# Patient Record
Sex: Female | Born: 1950 | Race: White | Hispanic: No | Marital: Single | Smoking: Never smoker
Health system: Southern US, Community
[De-identification: ages and names within clinical notes are randomized; demographics above are authoritative.]

## PROBLEM LIST (undated history)

## (undated) DIAGNOSIS — I2510 Atherosclerotic heart disease of native coronary artery without angina pectoris: Secondary | ICD-10-CM

## (undated) DIAGNOSIS — I214 Non-ST elevation (NSTEMI) myocardial infarction: Secondary | ICD-10-CM

## (undated) DIAGNOSIS — I509 Heart failure, unspecified: Secondary | ICD-10-CM

## (undated) DIAGNOSIS — J45909 Unspecified asthma, uncomplicated: Secondary | ICD-10-CM

## (undated) DIAGNOSIS — R519 Headache, unspecified: Secondary | ICD-10-CM

## (undated) DIAGNOSIS — K219 Gastro-esophageal reflux disease without esophagitis: Secondary | ICD-10-CM

## (undated) DIAGNOSIS — M1990 Unspecified osteoarthritis, unspecified site: Secondary | ICD-10-CM

## (undated) DIAGNOSIS — N189 Chronic kidney disease, unspecified: Secondary | ICD-10-CM

## (undated) DIAGNOSIS — I10 Essential (primary) hypertension: Secondary | ICD-10-CM

## (undated) DIAGNOSIS — R51 Headache: Secondary | ICD-10-CM

## (undated) DIAGNOSIS — N200 Calculus of kidney: Secondary | ICD-10-CM

## (undated) HISTORY — PX: LITHOTRIPSY: SUR834

## (undated) HISTORY — DX: Unspecified osteoarthritis, unspecified site: M19.90

## (undated) HISTORY — DX: Unspecified asthma, uncomplicated: J45.909

## (undated) HISTORY — DX: Chronic kidney disease, unspecified: N18.9

## (undated) HISTORY — DX: Essential (primary) hypertension: I10

## (undated) HISTORY — PX: TONSILLECTOMY: SUR1361

## (undated) HISTORY — PX: APPENDECTOMY: SHX54

## (undated) HISTORY — PX: ABDOMINAL HYSTERECTOMY: SHX81

## (undated) HISTORY — DX: Calculus of kidney: N20.0

---

## 2007-02-04 DIAGNOSIS — R7301 Impaired fasting glucose: Secondary | ICD-10-CM | POA: Insufficient documentation

## 2008-09-12 ENCOUNTER — Emergency Department: Payer: Self-pay | Admitting: Emergency Medicine

## 2008-09-19 ENCOUNTER — Ambulatory Visit: Payer: Self-pay | Admitting: Urology

## 2008-09-20 ENCOUNTER — Ambulatory Visit: Payer: Self-pay | Admitting: Urology

## 2008-09-21 ENCOUNTER — Ambulatory Visit: Payer: Self-pay | Admitting: Urology

## 2008-10-03 ENCOUNTER — Ambulatory Visit: Payer: Self-pay | Admitting: Urology

## 2008-10-10 ENCOUNTER — Ambulatory Visit: Payer: Self-pay | Admitting: Urology

## 2008-11-08 ENCOUNTER — Ambulatory Visit: Payer: Self-pay | Admitting: Urology

## 2009-05-22 ENCOUNTER — Ambulatory Visit: Payer: Self-pay | Admitting: General Practice

## 2009-05-23 ENCOUNTER — Ambulatory Visit: Payer: Self-pay | Admitting: General Practice

## 2010-02-10 ENCOUNTER — Encounter: Payer: Self-pay | Admitting: Family Medicine

## 2010-02-10 ENCOUNTER — Emergency Department: Payer: Self-pay | Admitting: Emergency Medicine

## 2010-02-10 ENCOUNTER — Ambulatory Visit
Admission: RE | Admit: 2010-02-10 | Discharge: 2010-02-10 | Payer: Self-pay | Source: Home / Self Care | Attending: Family Medicine | Admitting: Family Medicine

## 2010-02-10 DIAGNOSIS — K219 Gastro-esophageal reflux disease without esophagitis: Secondary | ICD-10-CM | POA: Insufficient documentation

## 2010-02-10 DIAGNOSIS — R1013 Epigastric pain: Secondary | ICD-10-CM | POA: Insufficient documentation

## 2010-02-10 DIAGNOSIS — I10 Essential (primary) hypertension: Secondary | ICD-10-CM | POA: Insufficient documentation

## 2010-02-10 LAB — CONVERTED CEMR LAB
Bilirubin Urine: NEGATIVE
Glucose, Urine, Semiquant: NEGATIVE
Ketones, urine, test strip: NEGATIVE
Specific Gravity, Urine: 1.02

## 2010-02-12 ENCOUNTER — Telehealth: Payer: Self-pay | Admitting: Family Medicine

## 2010-02-12 ENCOUNTER — Encounter: Payer: Self-pay | Admitting: Family Medicine

## 2010-02-14 ENCOUNTER — Ambulatory Visit: Payer: Self-pay | Admitting: Urology

## 2010-02-28 ENCOUNTER — Ambulatory Visit: Payer: Self-pay | Admitting: Urology

## 2010-03-07 NOTE — Assessment & Plan Note (Signed)
Summary: STOMACH HURTS/EVM   Vital Signs:  Patient Profile:   60 Years Old Female CC:      stomach ache Height:     69 inches Weight:      207 pounds BMI:     30.68 O2 Sat:      100 % O2 treatment:    Room Air Temp:     97.4 degrees F oral Pulse rate:   77 / minute BP sitting:   146 / 92  (left arm) Cuff size:   regular  Vitals Entered By: Haze Boyden, CMA (February 10, 2010 12:23 PM)                  Current Allergies (reviewed today): No known allergies History of Present Illness History from: patient Reason for visit: see chief complaint Chief Complaint: stomach ache History of Present Illness: This patient presented today with a history of 1 week of left lower quadrant and epigastric abdominal pain. She has had some burning sensations in the abdominal epigastric area.  No blood in stools or vagina.  Pt says that she has noticed increased frequency of the waves of pain.  She describes them as a squeezing sensation in the abdomen and a cramping sensation.  She says that she is having severe stress at home.  She has a history of an ulcer many years ago.  She denies constipation, diarrhea, chest pain and SOB. She denies dysuria.  She has a history of large kidney stones.  She has had an appendectomy and hysterectomy.     REVIEW OF SYSTEMS Constitutional Symptoms      Denies fever, chills, night sweats, weight loss, weight gain, and fatigue.  Eyes       Denies change in vision, eye pain, eye discharge, glasses, contact lenses, and eye surgery. Ear/Nose/Throat/Mouth       Denies hearing loss/aids, change in hearing, ear pain, ear discharge, dizziness, frequent runny nose, frequent nose bleeds, sinus problems, sore throat, hoarseness, and tooth pain or bleeding.  Respiratory       Denies dry cough, productive cough, wheezing, shortness of breath, asthma, bronchitis, and emphysema/COPD.  Cardiovascular       Denies murmurs, chest pain, and tires easily with exhertion.     Gastrointestinal       Complains of stomach pain.      Denies nausea/vomiting, diarrhea, constipation, blood in bowel movements, and indigestion.      Comments: squeezing, cramping, intermittent epigastric pain with back radiation Genitourniary       Denies painful urination, kidney stones, and loss of urinary control. Neurological       Denies paralysis, seizures, and fainting/blackouts. Musculoskeletal       Denies muscle pain, joint pain, joint stiffness, decreased range of motion, redness, swelling, muscle weakness, and gout.  Skin       Denies bruising, unusual mles/lumps or sores, and hair/skin or nail changes.  Psych       Denies mood changes, temper/anger issues, anxiety/stress, speech problems, depression, and sleep problems.  Past History:  Family History: Last updated: 02/10/2010 mother alive bypass surgery last year, osterporosis father deceased at 79 yrs old brother alive diabetes 1 sister 41yrs old  have heart problems 1 sister 50yrs old with thyroid problems  Social History: Last updated: 02/10/2010 Single Never Smoked Alcohol use-no Drug use-no Regular exercise-no Occupation:  Statistician Employee  Risk Factors: Exercise: no (02/10/2010)  Risk Factors: Smoking Status: never (02/10/2010)  Past Medical History: HTN Gerd History  of ulcer  Past Surgical History: Appendectomy 5th  grade Hysterectomy 1998  kidney stones blasted lats year and had a stent implanted  Vital Signs:  Patient profile:   60 Years Old Female Height:      69 inches Weight:      207 pounds BMI:     30.68 O2 Sat:      100 % Temp:     97.4 degrees F oral Pulse rate:   77 / minute BP sitting:   146 / 92 Cuff size:   regular  Vitals Entered By: Haze Boyden, CMA (February 10, 2010 12:39 PM)   Family History: mother alive bypass surgery last year, osterporosis father deceased at 58 yrs old brother alive diabetes 1 sister 53yrs old  have heart problems 1 sister 75yrs old  with thyroid problems  Social History: Single Never Smoked Alcohol use-no Drug use-no Regular exercise-no Occupation:  Warden/ranger Smoking Status:  never Drug Use:  no Does Patient Exercise:  no  Allergies (verified): No Known Drug Allergies  Physical Exam General appearance: well developed, well nourished, no acute distress Head: normocephalic, atraumatic Eyes: conjunctivae and lids normal Pupils: equal, round, reactive to light Ears: normal, no lesions or deformities Nasal: mucosa pink, nonedematous, no septal deviation, turbinates normal Oral/Pharynx: tongue normal, posterior pharynx without erythema or exudate Neck: neck supple,  trachea midline, no masses Chest/Lungs: no rales, wheezes, or rhonchi bilateral, breath sounds equal without effort Heart: regular rate and  rhythm, no murmur Abdomen: guarding noted especially with palpation of LUQ and LLQ where pt starts crying, BS present but hypoactive, no masses palpated, Left CVA TTP noted GU: mild suprapubic TTP noted Extremities: normal extremities Neurological: grossly intact and non-focal Skin: no obvious rashes or lesions MSE: oriented to time, place, and person Assessment New Problems: GASTROESOPHAGEAL REFLUX DISEASE (ICD-530.81) URINALYSIS, ABNORMAL (ICD-791.9) UNSPECIFIED ESSENTIAL HYPERTENSION (ICD-401.9) ABDOMINAL PAIN, EPIGASTRIC (ICD-789.06)   Patient Education: Patient and/or caregiver instructed in the following: rest, fluids. The risks, benefits and possible side effects were clearly explained and discussed with the patient.  The patient verbalized clear understanding.  The patient was given instructions to return if symptoms don't improve, worsen or new changes develop.  If it is not during clinic hours and the patient cannot get back to this clinic then the patient was told to seek medical care at an available urgent care or emergency department.  The patient verbalized understanding.   Demonstrates  willingness to comply.  Plan Planning Comments:   I told the patient that she needed to go to the ER for further evaluation and treatment of this abdominal pain.  The patient verbalized clear understanding.  The patient declined to go by EMS but instead said that she would go by private vehicle.  The risks were discussed with the patient and she verbalized understanding.  Her urinalysis results were sent with her and a urine culture was ordered.   We called and spoke with the Charge Nurse at [facility] ER and they will accept care of the patient.  Follow Up: Follow up on an as needed basis, Follow up with Primary Physician Follow Up: Go Directly to [facility] ER  The patient and/or caregiver has been counseled thoroughly with regard to medications prescribed including dosage, schedule, interactions, rationale for use, and possible side effects and they verbalize understanding.  Diagnoses and expected course of recovery discussed and will return if not improved as expected or if the condition worsens. Patient and/or caregiver verbalized understanding.  Patient Instructions: 1)  Go directly to the ER for evaluation and treatment of the abdominal pain that you are experiencing.  2)  We have already called the ER and discussed with the medical team.  They have your name. 3)  I am recommending that you stay out of work until you can be evaluated and the cause determined for your abdominal pain.     Laboratory Results   Urine Tests  Date/Time Received: 02/10/10 Date/Time Reported: 02/10/10  Routine Urinalysis   Color: amber Appearance: Cloudy Glucose: negative   (Normal Range: Negative) Bilirubin: negative   (Normal Range: Negative) Ketone: negative   (Normal Range: Negative) Spec. Gravity: 1.020   (Normal Range: 1.003-1.035) Blood: trace-intact   (Normal Range: Negative) pH: 7.0   (Normal Range: 5.0-8.0) Protein: 30   (Normal Range: Negative) Urobilinogen: 1.0   (Normal Range: 0-1) Nitrite: negative    (Normal Range: Negative) Leukocyte Esterace: trace   (Normal Range: Negative)

## 2010-03-07 NOTE — Letter (Signed)
Summary: ED Records from [facility]  ED Records from [facility]   Imported By: Rosine Beat 02/12/2010 17:29:40  _____________________________________________________________________  External Attachment:    Type:   Image     Comment:   External Document

## 2010-03-07 NOTE — Letter (Signed)
Summary: Out of Work  [facility]  [address]   [place], [place] [address]   Phone: [phone]  Fax: [phone]    February 12, 2010   Employee:  Natalia Leatherwood Kroon    To Whom It May Concern:   For Medical reasons, please excuse the above named employee from full duties until she has been cleared by her urologist.  No heavy lifting over 10 lbs recommended until treated and cleared by urologist for return to full, unrestricted duties.     If you need additional information, please feel free to contact our office.         Sincerely,    Standley Dakins MD

## 2010-03-07 NOTE — Letter (Signed)
Summary: out of work note  out of work note   Imported By: Erskine Squibb Breitmeier 02/12/2010 17:27:32  _____________________________________________________________________  External Attachment:    Type:   Image     Comment:   External Document

## 2010-03-07 NOTE — Letter (Signed)
Summary: Out of Work  [facility]  [address]   [place], [place] [address]   Phone: [phone]  Fax: [phone]    February 10, 2010   Employee:  Natalia Leatherwood Gowans    To Whom It May Concern:   For Medical reasons, please excuse the above named employee from work for the following dates:  Start:   February 10, 2009  End:   Until patient has been cleared to return after visit to emergency room.  If you need additional information, please feel free to contact our office.         Sincerely,    Standley Dakins MD

## 2010-03-07 NOTE — Letter (Signed)
Summary: Work excuse  Work excuse   Imported By: Dorna Leitz 02/10/2010 16:29:22  _____________________________________________________________________  External Attachment:    Type:   Image     Comment:   External Document

## 2010-03-07 NOTE — Progress Notes (Signed)
Summary: Checking On Patient - Doing Better  ---- Converted from flag ---- ---- 02/12/2010 11:38 AM, Levonne Spiller EMT-P wrote: Pt. is doing well. She had no complaints during our conversation. I advised her to contact our office if she had any questions or needed anything. / rwt  ---- 02/12/2010 11:12 AM, Standley Dakins MD wrote: Please check on the patient and see if she is doing OK.  Thanks. ------------------------------

## 2010-03-08 ENCOUNTER — Ambulatory Visit: Payer: Self-pay | Admitting: Urology

## 2010-03-12 ENCOUNTER — Ambulatory Visit: Payer: Self-pay | Admitting: Urology

## 2010-03-19 ENCOUNTER — Ambulatory Visit: Payer: Self-pay | Admitting: Urology

## 2010-05-05 DIAGNOSIS — E079 Disorder of thyroid, unspecified: Secondary | ICD-10-CM | POA: Insufficient documentation

## 2010-05-06 ENCOUNTER — Ambulatory Visit: Payer: Self-pay | Admitting: Urology

## 2010-05-13 ENCOUNTER — Ambulatory Visit: Payer: Self-pay | Admitting: Urology

## 2010-05-22 ENCOUNTER — Ambulatory Visit: Payer: Self-pay | Admitting: Internal Medicine

## 2010-05-24 ENCOUNTER — Ambulatory Visit: Payer: Self-pay | Admitting: Internal Medicine

## 2010-10-17 ENCOUNTER — Ambulatory Visit: Payer: Self-pay | Admitting: Internal Medicine

## 2010-11-01 ENCOUNTER — Ambulatory Visit: Payer: Self-pay | Admitting: Internal Medicine

## 2010-11-12 ENCOUNTER — Ambulatory Visit: Payer: Self-pay | Admitting: Internal Medicine

## 2010-11-21 ENCOUNTER — Ambulatory Visit: Payer: Self-pay | Admitting: Internal Medicine

## 2010-12-31 ENCOUNTER — Ambulatory Visit: Payer: Self-pay | Admitting: Surgery

## 2010-12-31 HISTORY — PX: BREAST BIOPSY: SHX20

## 2011-04-25 ENCOUNTER — Ambulatory Visit: Payer: Self-pay | Admitting: Specialist

## 2011-05-15 ENCOUNTER — Ambulatory Visit: Payer: Self-pay | Admitting: Surgery

## 2012-03-01 ENCOUNTER — Other Ambulatory Visit: Payer: Self-pay | Admitting: Internal Medicine

## 2012-03-01 LAB — CBC WITH DIFFERENTIAL/PLATELET
Basophil #: 0 10*3/uL (ref 0.0–0.1)
Eosinophil #: 0.1 10*3/uL (ref 0.0–0.7)
Eosinophil %: 1.1 %
Lymphocyte #: 1.8 10*3/uL (ref 1.0–3.6)
MCHC: 34.9 g/dL (ref 32.0–36.0)
Monocyte %: 9 %
Neutrophil %: 56.5 %

## 2012-03-01 LAB — LIPID PANEL
HDL Cholesterol: 27 mg/dL — ABNORMAL LOW (ref 40–60)
Triglycerides: 127 mg/dL (ref 0–200)
VLDL Cholesterol, Calc: 25 mg/dL (ref 5–40)

## 2012-03-01 LAB — COMPREHENSIVE METABOLIC PANEL
BUN: 22 mg/dL — ABNORMAL HIGH (ref 7–18)
Chloride: 108 mmol/L — ABNORMAL HIGH (ref 98–107)
Creatinine: 0.99 mg/dL (ref 0.60–1.30)
EGFR (Non-African Amer.): >60
Glucose: 92 mg/dL (ref 65–99)
SGPT (ALT): 77 U/L (ref 12–78)

## 2012-03-01 LAB — HEMOGLOBIN A1C: Hemoglobin A1C: 5.5 % (ref 4.2–6.3)

## 2012-03-02 DIAGNOSIS — R748 Abnormal levels of other serum enzymes: Secondary | ICD-10-CM | POA: Insufficient documentation

## 2012-03-23 ENCOUNTER — Other Ambulatory Visit: Payer: Self-pay | Admitting: Internal Medicine

## 2012-03-23 LAB — HEPATIC FUNCTION PANEL A (ARMC)
Albumin: 3.7 g/dL (ref 3.4–5.0)
SGOT(AST): 25 U/L (ref 15–37)
SGPT (ALT): 38 U/L (ref 12–78)
Total Protein: 6.9 g/dL (ref 6.4–8.2)

## 2012-12-28 ENCOUNTER — Ambulatory Visit: Payer: Self-pay | Admitting: Nephrology

## 2013-03-02 DIAGNOSIS — N270 Small kidney, unilateral: Secondary | ICD-10-CM | POA: Insufficient documentation

## 2013-03-28 ENCOUNTER — Ambulatory Visit: Payer: Self-pay | Admitting: Urology

## 2013-04-05 ENCOUNTER — Ambulatory Visit: Payer: Self-pay | Admitting: Urology

## 2013-07-14 ENCOUNTER — Ambulatory Visit: Payer: Self-pay | Admitting: Internal Medicine

## 2014-03-29 DIAGNOSIS — N135 Crossing vessel and stricture of ureter without hydronephrosis: Secondary | ICD-10-CM | POA: Insufficient documentation

## 2014-05-27 NOTE — Op Note (Signed)
PATIENT NAME:  Audrey Wolf, Audrey Wolf MR#:  [id number] DATE OF BIRTH:  Nov 15, 1950  DATE OF PROCEDURE:  04/05/2013  PRINCIPAL DIAGNOSES: Left ureterolithiasis, left ureteral stricture.   POSTOPERATIVE DIAGNOSIS: Left ureterolithiasis, left ureteral stricture.   PROCEDURE: Left ureteroscopy, retrograde pyelogram.   SURGEON: Assunta GamblesBrian Leva Baine, M.D.   ANESTHESIA: Laryngeal mask airway anesthesia.   INDICATIONS: The patient is a 64 year old white female with a history of nephrolithiasis and ureteral calculi. She has had a large obstructing midureteral calculus in the past with significant inflammation and development of ureteral stricture. She underwent treatment. She had been doing well until approximately 2 months ago, when she developed onset of left-sided flank pain. She underwent subsequent evaluation demonstrating an approximately 9 mm stone in the mid urethra at the level of the crossing vessels at the site of the previous stricture. Moderate hydronephrosis was present. Significant renal atrophy was appreciated, consistent with long-standing obstruction. She presents for ureteroscopy and stone removal with possible dilation of ureteral stricture.   PROCEDURE: After informed consent was obtained, the patient was taken to the Operating Room and placed in the dorsal lithotomy position under laryngeal mask airway anesthesia. The patient was then prepped and draped in the usual standard fashion. An initial attempt at passing a 0.35 guidewire, was unsuccessful. At the level of the stone, multiple attempts were made without success. The cystoscope was removed. The 6-French rigid ureteroscope was advanced into the urinary bladder. The guidewire was advanced into the left ureteral orifice. The scope was easily advanced to the level of the crossing vessels. Prominent narrowing of the ureter was encountered at the site of the previous stricture. The apical aspect demonstrated a very small pinpoint opening. Multiple  attempts were made at passing the guidewire which were unsuccessful. A 0.25 guidewire was then utilized which was also unsuccessfully passed. There was question as to whether stone could be felt at some point through the stricture. The scope was then utilized to perform a retrograde pyelogram. There was no evidence of contrast passing beyond the site.  Additional attempts were made at advancing the 0.25 guidewire, all of these were unsuccessful. The decision was made at this point to abort any further attempts at dilation of the ureter due to the significant risk for injury. The ureteroscope was removed. The cystoscope was replaced back into the urinary bladder. The bladder was drained. The scope was then removed. The patient was returned to the supine position and awakened from laryngeal mask airway anesthesia. She was taken to the recovery room in stable condition. There were no problems or complications. The patient tolerated the procedure well.   ____________________________ Madolyn FriezeBrian S. Achilles Dunkope, MD bsc:cs D: 04/05/2013 11:25:16 ET T: 04/05/2013 18:47:02 ET JOB#: [id number]  cc: Madolyn FriezeBrian S. Achilles Dunkope, MD, <Dictator> Madolyn FriezeBRIAN S Arrington Yohe MD ELECTRONICALLY SIGNED 04/10/2013 14:25

## 2014-09-27 DIAGNOSIS — N289 Disorder of kidney and ureter, unspecified: Secondary | ICD-10-CM | POA: Insufficient documentation

## 2015-03-22 ENCOUNTER — Other Ambulatory Visit
Admission: RE | Admit: 2015-03-22 | Discharge: 2015-03-22 | Disposition: A | Discharge status: Home / Self Care | Payer: BLUE CROSS/BLUE SHIELD | Source: Ambulatory Visit | Attending: Nephrology | Admitting: Nephrology

## 2015-03-22 DIAGNOSIS — I129 Hypertensive chronic kidney disease with stage 1 through stage 4 chronic kidney disease, or unspecified chronic kidney disease: Secondary | ICD-10-CM | POA: Insufficient documentation

## 2015-03-22 DIAGNOSIS — N182 Chronic kidney disease, stage 2 (mild): Secondary | ICD-10-CM | POA: Diagnosis not present

## 2015-03-22 LAB — COMPREHENSIVE METABOLIC PANEL
ALT: 24 U/L (ref 14–54)
AST: 24 U/L (ref 15–41)
Albumin: 4 g/dL (ref 3.5–5.0)
Alkaline Phosphatase: 73 U/L (ref 38–126)
Anion gap: 10 (ref 5–15)
BUN: 26 mg/dL — AB (ref 6–20)
CHLORIDE: 106 mmol/L (ref 101–111)
CO2: 26 mmol/L (ref 22–32)
CREATININE: 0.96 mg/dL (ref 0.44–1.00)
Calcium: 9.5 mg/dL (ref 8.9–10.3)
GFR calc Af Amer: >60 mL/min (ref >60)
GFR calc non Af Amer: >60 mL/min (ref >60)
Glucose, Bld: 98 mg/dL (ref 65–99)
POTASSIUM: 3.8 mmol/L (ref 3.5–5.1)
SODIUM: 142 mmol/L (ref 135–145)
Total Bilirubin: 1 mg/dL (ref 0.3–1.2)
Total Protein: 6.9 g/dL (ref 6.5–8.1)

## 2015-03-23 LAB — MICROALBUMIN / CREATININE URINE RATIO
CREATININE, UR: 84.1 mg/dL
MICROALB UR: 8.7 ug/mL — AB
Microalb Creat Ratio: 10.3 mg/g creat (ref 0.0–30.0)

## 2016-04-03 LAB — HEPATIC FUNCTION PANEL
ALK PHOS: 90 U/L (ref 25–125)
ALT: 26 U/L (ref 7–35)
AST: 19 U/L (ref 13–35)
Bilirubin, Total: 0.4 mg/dL

## 2016-04-03 LAB — BASIC METABOLIC PANEL
BUN: 23 mg/dL — AB (ref 4–21)
CREATININE: 1 mg/dL (ref 0.5–1.1)
Glucose: 103 mg/dL
Potassium: 4.1 mmol/L (ref 3.4–5.3)
Sodium: 141 mmol/L (ref 137–147)

## 2016-06-05 ENCOUNTER — Ambulatory Visit (INDEPENDENT_AMBULATORY_CARE_PROVIDER_SITE_OTHER): Payer: BLUE CROSS/BLUE SHIELD | Admitting: Family Medicine

## 2016-06-05 ENCOUNTER — Encounter: Payer: Self-pay | Admitting: Family Medicine

## 2016-06-05 VITALS — BP 196/100 | HR 77 | Temp 98.3°F | Ht 68.5 in | Wt 231.2 lb

## 2016-06-05 DIAGNOSIS — E785 Hyperlipidemia, unspecified: Secondary | ICD-10-CM | POA: Insufficient documentation

## 2016-06-05 DIAGNOSIS — R946 Abnormal results of thyroid function studies: Secondary | ICD-10-CM | POA: Diagnosis not present

## 2016-06-05 DIAGNOSIS — Z1239 Encounter for other screening for malignant neoplasm of breast: Secondary | ICD-10-CM

## 2016-06-05 DIAGNOSIS — Z1231 Encounter for screening mammogram for malignant neoplasm of breast: Secondary | ICD-10-CM | POA: Diagnosis not present

## 2016-06-05 DIAGNOSIS — E2839 Other primary ovarian failure: Secondary | ICD-10-CM | POA: Diagnosis not present

## 2016-06-05 DIAGNOSIS — N183 Chronic kidney disease, stage 3 unspecified: Secondary | ICD-10-CM | POA: Insufficient documentation

## 2016-06-05 DIAGNOSIS — I10 Essential (primary) hypertension: Secondary | ICD-10-CM | POA: Diagnosis not present

## 2016-06-05 DIAGNOSIS — K219 Gastro-esophageal reflux disease without esophagitis: Secondary | ICD-10-CM | POA: Diagnosis not present

## 2016-06-05 DIAGNOSIS — R7989 Other specified abnormal findings of blood chemistry: Secondary | ICD-10-CM

## 2016-06-05 DIAGNOSIS — N135 Crossing vessel and stricture of ureter without hydronephrosis: Secondary | ICD-10-CM

## 2016-06-05 DIAGNOSIS — N182 Chronic kidney disease, stage 2 (mild): Secondary | ICD-10-CM

## 2016-06-05 LAB — COMPREHENSIVE METABOLIC PANEL
ALBUMIN: 4.3 g/dL (ref 3.5–5.2)
ALT: 17 U/L (ref 0–35)
AST: 16 U/L (ref 0–37)
Alkaline Phosphatase: 80 U/L (ref 39–117)
BUN: 19 mg/dL (ref 6–23)
CALCIUM: 9.3 mg/dL (ref 8.4–10.5)
CHLORIDE: 107 meq/L (ref 96–112)
CO2: 24 meq/L (ref 19–32)
CREATININE: 0.99 mg/dL (ref 0.40–1.20)
GFR: 59.57 mL/min — ABNORMAL LOW (ref >60.00)
Glucose, Bld: 104 mg/dL — ABNORMAL HIGH (ref 70–99)
POTASSIUM: 3.8 meq/L (ref 3.5–5.1)
SODIUM: 140 meq/L (ref 135–145)
Total Bilirubin: 0.6 mg/dL (ref 0.2–1.2)
Total Protein: 7.1 g/dL (ref 6.0–8.3)

## 2016-06-05 LAB — CBC
HCT: 41.2 % (ref 36.0–46.0)
Hemoglobin: 13.9 g/dL (ref 12.0–15.0)
MCHC: 33.7 g/dL (ref 30.0–36.0)
MCV: 91.8 fl (ref 78.0–100.0)
PLATELETS: 233 10*3/uL (ref 150.0–400.0)
RBC: 4.49 Mil/uL (ref 3.87–5.11)
RDW: 13.9 % (ref 11.5–15.5)
WBC: 6.5 10*3/uL (ref 4.0–10.5)

## 2016-06-05 LAB — LIPID PANEL
CHOL/HDL RATIO: 5
CHOLESTEROL: 190 mg/dL (ref 0–200)
HDL: 38.6 mg/dL — ABNORMAL LOW (ref >39.00)
LDL Cholesterol: 124 mg/dL — ABNORMAL HIGH (ref 0–99)
NonHDL: 151.61
TRIGLYCERIDES: 136 mg/dL (ref 0.0–149.0)
VLDL: 27.2 mg/dL (ref 0.0–40.0)

## 2016-06-05 LAB — TSH: TSH: 3.58 u[IU]/mL (ref 0.35–4.50)

## 2016-06-05 LAB — HEMOGLOBIN A1C: Hgb A1c MFr Bld: 5.7 % (ref 4.6–6.5)

## 2016-06-05 NOTE — Assessment & Plan Note (Signed)
Stable on Zantac.  

## 2016-06-05 NOTE — Assessment & Plan Note (Signed)
Continue to follow with Urology

## 2016-06-05 NOTE — Assessment & Plan Note (Signed)
Unsure of control.  Lipid panel today. ?

## 2016-06-05 NOTE — Progress Notes (Signed)
Pre visit review using our clinic review tool, if applicable. No additional management support is needed unless otherwise documented below in the visit note. 

## 2016-06-05 NOTE — Patient Instructions (Signed)
Call and schedule your mammogram.  We will arrange the Dexa scan.  Follow up in 3 months.  Take care  Dr. Lacinda Axon   Health Maintenance, Female Adopting a healthy lifestyle and getting preventive care can go a long way to promote health and wellness. Talk with your health care provider about what schedule of regular examinations is right for you. This is a good chance for you to check in with your provider about disease prevention and staying healthy. In between checkups, there are plenty of things you can do on your own. Experts have done a lot of research about which lifestyle changes and preventive measures are most likely to keep you healthy. Ask your health care provider for more information. Weight and diet Eat a healthy diet  Be sure to include plenty of vegetables, fruits, low-fat dairy products, and lean protein.  Do not eat a lot of foods high in solid fats, added sugars, or salt.  Get regular exercise. This is one of the most important things you can do for your health.  Most adults should exercise for at least 150 minutes each week. The exercise should increase your heart rate and make you sweat (moderate-intensity exercise).  Most adults should also do strengthening exercises at least twice a week. This is in addition to the moderate-intensity exercise. Maintain a healthy weight  Body mass index (BMI) is a measurement that can be used to identify possible weight problems. It estimates body fat based on height and weight. Your health care provider can help determine your BMI and help you achieve or maintain a healthy weight.  For females 27 years of age and older:  A BMI below 18.5 is considered underweight.  A BMI of 18.5 to 24.9 is normal.  A BMI of 25 to 29.9 is considered overweight.  A BMI of 30 and above is considered obese. Watch levels of cholesterol and blood lipids  You should start having your blood tested for lipids and cholesterol at 67 years of age, then  have this test every 5 years.  You may need to have your cholesterol levels checked more often if:  Your lipid or cholesterol levels are high.  You are older than 67 years of age.  You are at high risk for heart disease. Cancer screening Lung Cancer  Lung cancer screening is recommended for adults 26-25 years old who are at high risk for lung cancer because of a history of smoking.  A yearly low-dose CT scan of the lungs is recommended for people who:  Currently smoke.  Have quit within the past 15 years.  Have at least a 30-pack-year history of smoking. A pack year is smoking an average of one pack of cigarettes a day for 1 year.  Yearly screening should continue until it has been 15 years since you quit.  Yearly screening should stop if you develop a health problem that would prevent you from having lung cancer treatment. Breast Cancer  Practice breast self-awareness. This means understanding how your breasts normally appear and feel.  It also means doing regular breast self-exams. Let your health care provider know about any changes, no matter how small.  If you are in your 20s or 30s, you should have a clinical breast exam (CBE) by a health care provider every 1-3 years as part of a regular health exam.  If you are 52 or older, have a CBE every year. Also consider having a breast X-ray (mammogram) every year.  If you have  a family history of breast cancer, talk to your health care provider about genetic screening.  If you are at high risk for breast cancer, talk to your health care provider about having an MRI and a mammogram every year.  Breast cancer gene (BRCA) assessment is recommended for women who have family members with BRCA-related cancers. BRCA-related cancers include:  Breast.  Ovarian.  Tubal.  Peritoneal cancers.  Results of the assessment will determine the need for genetic counseling and BRCA1 and BRCA2 testing. Cervical Cancer  Your health care  provider may recommend that you be screened regularly for cancer of the pelvic organs (ovaries, uterus, and vagina). This screening involves a pelvic examination, including checking for microscopic changes to the surface of your cervix (Pap test). You may be encouraged to have this screening done every 3 years, beginning at age 62.  For women ages 86-65, health care providers may recommend pelvic exams and Pap testing every 3 years, or they may recommend the Pap and pelvic exam, combined with testing for human papilloma virus (HPV), every 5 years. Some types of HPV increase your risk of cervical cancer. Testing for HPV may also be done on women of any age with unclear Pap test results.  Other health care providers may not recommend any screening for nonpregnant women who are considered low risk for pelvic cancer and who do not have symptoms. Ask your health care provider if a screening pelvic exam is right for you.  If you have had past treatment for cervical cancer or a condition that could lead to cancer, you need Pap tests and screening for cancer for at least 20 years after your treatment. If Pap tests have been discontinued, your risk factors (such as having a new sexual partner) need to be reassessed to determine if screening should resume. Some women have medical problems that increase the chance of getting cervical cancer. In these cases, your health care provider may recommend more frequent screening and Pap tests. Colorectal Cancer  This type of cancer can be detected and often prevented.  Routine colorectal cancer screening usually begins at 67 years of age and continues through 67 years of age.  Your health care provider may recommend screening at an earlier age if you have risk factors for colon cancer.  Your health care provider may also recommend using home test kits to check for hidden blood in the stool.  A small camera at the end of a tube can be used to examine your colon directly  (sigmoidoscopy or colonoscopy). This is done to check for the earliest forms of colorectal cancer.  Routine screening usually begins at age 56.  Direct examination of the colon should be repeated every 5-10 years through 67 years of age. However, you may need to be screened more often if early forms of precancerous polyps or small growths are found. Skin Cancer  Check your skin from head to toe regularly.  Tell your health care provider about any new moles or changes in moles, especially if there is a change in a mole's shape or color.  Also tell your health care provider if you have a mole that is larger than the size of a pencil eraser.  Always use sunscreen. Apply sunscreen liberally and repeatedly throughout the day.  Protect yourself by wearing long sleeves, pants, a wide-brimmed hat, and sunglasses whenever you are outside. Heart disease, diabetes, and high blood pressure  High blood pressure causes heart disease and increases the risk of stroke. High  blood pressure is more likely to develop in:  People who have blood pressure in the high end of the normal range (130-139/85-89 mm Hg).  People who are overweight or obese.  People who are African American.  If you are 44-47 years of age, have your blood pressure checked every 3-5 years. If you are 71 years of age or older, have your blood pressure checked every year. You should have your blood pressure measured twice-once when you are at a hospital or clinic, and once when you are not at a hospital or clinic. Record the average of the two measurements. To check your blood pressure when you are not at a hospital or clinic, you can use:  An automated blood pressure machine at a pharmacy.  A home blood pressure monitor.  If you are between 19 years and 21 years old, ask your health care provider if you should take aspirin to prevent strokes.  Have regular diabetes screenings. This involves taking a blood sample to check your  fasting blood sugar level.  If you are at a normal weight and have a low risk for diabetes, have this test once every three years after 67 years of age.  If you are overweight and have a high risk for diabetes, consider being tested at a younger age or more often. Preventing infection Hepatitis B  If you have a higher risk for hepatitis B, you should be screened for this virus. You are considered at high risk for hepatitis B if:  You were born in a country where hepatitis B is common. Ask your health care provider which countries are considered high risk.  Your parents were born in a high-risk country, and you have not been immunized against hepatitis B (hepatitis B vaccine).  You have HIV or AIDS.  You use needles to inject street drugs.  You live with someone who has hepatitis B.  You have had sex with someone who has hepatitis B.  You get hemodialysis treatment.  You take certain medicines for conditions, including cancer, organ transplantation, and autoimmune conditions. Hepatitis C  Blood testing is recommended for:  Everyone born from [birth year] through 1966.  Anyone with known risk factors for hepatitis C. Sexually transmitted infections (STIs)  You should be screened for sexually transmitted infections (STIs) including gonorrhea and chlamydia if:  You are sexually active and are younger than 67 years of age.  You are older than 67 years of age and your health care provider tells you that you are at risk for this type of infection.  Your sexual activity has changed since you were last screened and you are at an increased risk for chlamydia or gonorrhea. Ask your health care provider if you are at risk.  If you do not have HIV, but are at risk, it may be recommended that you take a prescription medicine daily to prevent HIV infection. This is called pre-exposure prophylaxis (PrEP). You are considered at risk if:  You are sexually active and do not regularly use condoms or  know the HIV status of your partner(s).  You take drugs by injection.  You are sexually active with a partner who has HIV. Talk with your health care provider about whether you are at high risk of being infected with HIV. If you choose to begin PrEP, you should first be tested for HIV. You should then be tested every 3 months for as long as you are taking PrEP. Pregnancy  If you are premenopausal and you  may become pregnant, ask your health care provider about preconception counseling.  If you may become pregnant, take 400 to 800 micrograms (mcg) of folic acid every day.  If you want to prevent pregnancy, talk to your health care provider about birth control (contraception). Osteoporosis and menopause  Osteoporosis is a disease in which the bones lose minerals and strength with aging. This can result in serious bone fractures. Your risk for osteoporosis can be identified using a bone density scan.  If you are 19 years of age or older, or if you are at risk for osteoporosis and fractures, ask your health care provider if you should be screened.  Ask your health care provider whether you should take a calcium or vitamin D supplement to lower your risk for osteoporosis.  Menopause may have certain physical symptoms and risks.  Hormone replacement therapy may reduce some of these symptoms and risks. Talk to your health care provider about whether hormone replacement therapy is right for you. Follow these instructions at home:  Schedule regular health, dental, and eye exams.  Stay current with your immunizations.  Do not use any tobacco products including cigarettes, chewing tobacco, or electronic cigarettes.  If you are pregnant, do not drink alcohol.  If you are breastfeeding, limit how much and how often you drink alcohol.  Limit alcohol intake to no more than 1 drink per day for nonpregnant women. One drink equals 12 ounces of beer, 5 ounces of wine, or 1 ounces of hard  liquor.  Do not use street drugs.  Do not share needles.  Ask your health care provider for help if you need support or information about quitting drugs.  Tell your health care provider if you often feel depressed.  Tell your health care provider if you have ever been abused or do not feel safe at home. This information is not intended to replace advice given to you by your health care provider. Make sure you discuss any questions you have with your health care provider. Document Released: 08/05/2010 Document Revised: 06/28/2015 Document Reviewed: 10/24/2014 Elsevier Interactive Patient Education  2017 [employer].

## 2016-06-05 NOTE — Assessment & Plan Note (Signed)
Uncontrolled/severe. Per the nephrology note, she is supposed to be on 5 drugs. She endorsed being on clonidine, Norvasc, hydralazine, and losartan. The last nephrology note reflects that she should be on carvedilol. I am not sure why she is not on it. This information was obtained after the visit. I called the nephrology office and he was not available. I will attempt to discuss with urology. Patient is to continue her medications for now on follow-up closely with nephrology.

## 2016-06-05 NOTE — Progress Notes (Signed)
Subjective:  Patient ID: Audrey Wolf, female    DOB: [birth year]  Age: 66 y.o. MRN: [id number]  CC: Establish care  HPI Audrey Wolf is a 66 y.o. female presents to the clinic today to establish care. Issues/concerns are below.   HTN  Patient's blood pressure markedly elevated today.  She has a history of chronic kidney disease and hypertension as well as ureteral stricture/atrophic kidney.  Been followed by nephrology who has been managing.  Per nephrology, her blood pressure has been improving.  Patient states that her blood pressures are elevated at home. Typically in the 160s or greater systolic.   She endorses compliance with Norvasc, clonidine, hydralazine, losartan.  GERD  Stable on Zantac.   Hyperlipidemia  History of hyperlipidemia per the EMR.  Needs labs today.  Ureteral stricture & Hydronephrosis  Follows with urology.  Urology has recommend nephrectomy.   PMH, Surgical Hx, Family Hx, Social History reviewed and updated as below.  Past Medical History:  Diagnosis Date  . Arthritis   . Asthma   . Chronic kidney disease   . Hypertension   . Nephrolithiasis    Past Surgical History:  Procedure Laterality Date  . ABDOMINAL HYSTERECTOMY    . APPENDECTOMY    . BRAIN SURGERY    . LITHOTRIPSY    . TONSILLECTOMY     Family History  Problem Relation Age of Onset  . Arthritis Mother   . Arthritis Father   . Heart disease Father   . Stroke Father   . Sudden Cardiac Death Father    Social History  Substance Use Topics  . Smoking status: Never Smoker  . Smokeless tobacco: Never Used  . Alcohol use No    Review of Systems  Eyes: Positive for visual disturbance.  Respiratory: Positive for shortness of breath.   Musculoskeletal: Positive for arthralgias.  Psychiatric/Behavioral:       Stress.  All other systems reviewed and are negative.   Objective:   Today's Vitals: BP (!) 196/100   Pulse 77   Temp 98.3 F (36.8 C)  (Oral)   Ht 5' 8.5" (1.74 m)   Wt 231 lb 4 oz (104.9 kg)   SpO2 98%   BMI 34.65 kg/m   Physical Exam  Constitutional: She is oriented to person, place, and time. She appears well-developed. No distress.  HENT:  Head: Normocephalic and atraumatic.  Mouth/Throat: Oropharynx is clear and moist.  Eyes: Conjunctivae are normal.  Neck: Neck supple.  Cardiovascular: Normal rate and regular rhythm.   Pulmonary/Chest: Effort normal and breath sounds normal.  Abdominal: Soft. She exhibits no distension. There is no tenderness. There is no rebound and no guarding.  Musculoskeletal: Normal range of motion.  Neurological: She is alert and oriented to person, place, and time.  Skin: No rash noted.  Psychiatric: She has a normal mood and affect.  Vitals reviewed.  Assessment & Plan:   Problem List Items Addressed This Visit    CKD (chronic kidney disease) stage 2, GFR 60-89 ml/min   Relevant Orders   CBC   Hemoglobin A1c   Comprehensive metabolic panel   GERD (gastroesophageal reflux disease)    Stable on Zantac.      Relevant Medications   RaNITidine HCl (ZANTAC PO)   Hyperlipidemia    Unsure of control. Lipid panel today.       Relevant Medications   cloNIDine (CATAPRES) 0.1 MG tablet   losartan (COZAAR) 100 MG tablet   ASPIRIN 81 PO  hydrALAZINE (APRESOLINE) 50 MG tablet   amLODipine (NORVASC) 10 MG tablet   cloNIDine (CATAPRES - DOSED IN MG/24 HR) 0.1 mg/24hr patch   Other Relevant Orders   Lipid panel   Hypertension - Primary    Uncontrolled/severe. Per the nephrology note, she is supposed to be on 5 drugs. She endorsed being on clonidine, Norvasc, hydralazine, and losartan. The last nephrology note reflects that she should be on carvedilol. I am not sure why she is not on it. This information was obtained after the visit. I called the nephrology office and he was not available. I will attempt to discuss with urology. Patient is to continue her medications for now on  follow-up closely with nephrology.      Relevant Medications   cloNIDine (CATAPRES) 0.1 MG tablet   losartan (COZAAR) 100 MG tablet   ASPIRIN 81 PO   hydrALAZINE (APRESOLINE) 50 MG tablet   amLODipine (NORVASC) 10 MG tablet   cloNIDine (CATAPRES - DOSED IN MG/24 HR) 0.1 mg/24hr patch   Ureteral stricture, left    Continue to follow with Urology.       Other Visit Diagnoses    Screening for breast cancer       Relevant Orders   US BREAST LTD UNI LEFT INC AXILLA   US BREAST LTD UNI RIGHT INC AXILLA   MM SCREENING BREAST TOMO BILATERAL   Abnormal thyroid blood test       Relevant Orders   TSH   Estrogen deficiency       Relevant Orders   DG BONE DENSITY (DXA)      Follow-up: 3 months  Hazelynn Mckenny Adriana Simas DO [facility]

## 2016-06-06 ENCOUNTER — Other Ambulatory Visit: Payer: Self-pay | Admitting: Family Medicine

## 2016-06-06 ENCOUNTER — Encounter: Payer: Self-pay | Admitting: Family Medicine

## 2016-06-06 MED ORDER — ROSUVASTATIN CALCIUM 20 MG PO TABS: 20.0000 mg | ORAL_TABLET | Freq: Every day | ORAL | 3 refills | Status: DC

## 2016-08-07 DIAGNOSIS — N182 Chronic kidney disease, stage 2 (mild): Secondary | ICD-10-CM | POA: Diagnosis not present

## 2016-08-07 DIAGNOSIS — E559 Vitamin D deficiency, unspecified: Secondary | ICD-10-CM | POA: Diagnosis not present

## 2016-08-07 DIAGNOSIS — I10 Essential (primary) hypertension: Secondary | ICD-10-CM | POA: Diagnosis not present

## 2016-08-07 DIAGNOSIS — N1330 Unspecified hydronephrosis: Secondary | ICD-10-CM | POA: Diagnosis not present

## 2016-08-26 ENCOUNTER — Ambulatory Visit
Admission: RE | Admit: 2016-08-26 | Discharge: 2016-08-26 | Disposition: A | Discharge status: Home / Self Care | Payer: BLUE CROSS/BLUE SHIELD | Source: Ambulatory Visit | Attending: Family Medicine | Admitting: Family Medicine

## 2016-08-26 DIAGNOSIS — Z1231 Encounter for screening mammogram for malignant neoplasm of breast: Secondary | ICD-10-CM | POA: Insufficient documentation

## 2016-08-26 DIAGNOSIS — Z1239 Encounter for other screening for malignant neoplasm of breast: Secondary | ICD-10-CM

## 2016-08-26 DIAGNOSIS — E2839 Other primary ovarian failure: Secondary | ICD-10-CM | POA: Insufficient documentation

## 2016-09-05 ENCOUNTER — Encounter: Payer: Self-pay | Admitting: Family Medicine

## 2016-09-05 ENCOUNTER — Ambulatory Visit (INDEPENDENT_AMBULATORY_CARE_PROVIDER_SITE_OTHER): Payer: BLUE CROSS/BLUE SHIELD | Admitting: Family Medicine

## 2016-09-05 DIAGNOSIS — I10 Essential (primary) hypertension: Secondary | ICD-10-CM | POA: Diagnosis not present

## 2016-09-05 MED ORDER — CLONIDINE HCL 0.3 MG/24HR TD PTWK: 0.3000 mg | MEDICATED_PATCH | TRANSDERMAL | 3 refills | Status: DC

## 2016-09-05 NOTE — Patient Instructions (Signed)
I have increased the clonidine.   Follow up in 1 month.  Take care  Dr. Adriana Simasook

## 2016-09-05 NOTE — Progress Notes (Signed)
   Subjective:  Patient ID: Audrey Wolf, female    DOB: 1950-09-28  Age: 66 y.o. MRN: [id number]  CC: Follow up HTN  HPI:  66 year old female with CKD, unilateral small kidney, urethral stricture, hypertension, hyperlipidemia presents for follow-up regarding hypertension.  Hypertension  Improved but still uncontrolled.  She is tolerating her medications without difficulty: Amlodipine 10 mg daily, clonidine patch 0.1 mg, clonidine tablet 0.1 mg, hydralazine 50 mg 3 times a day, losartan 100 mg daily.  There has been discussion about removing her kidney as this may be contributing to her hypertension.  No side effects. She states that she feels well otherwise.  Social Hx   Social History   Social History  . Marital status: Single    Spouse name: N/A  . Number of children: N/A  . Years of education: N/A   Occupational History  .  [employer]   Social History Main Topics  . Smoking status: Never Smoker  . Smokeless tobacco: Never Used  . Alcohol use No  . Drug use: No  . Sexual activity: No   Other Topics Concern  . None   Social History Narrative  . None    Review of Systems  Respiratory: Negative.   Cardiovascular: Negative.    Objective:  BP (!) 150/100 (BP Location: Left Arm, Patient Position: Sitting, Cuff Size: Large)   Pulse 69   Temp 98.5 F (36.9 C) (Oral)   Wt 232 lb (105.2 kg)   SpO2 98%   BMI 34.76 kg/m   BP/Weight 09/05/2016 06/05/2016 02/10/2010  Systolic BP 150 196 146  Diastolic BP 100 100 92  Wt. (Lbs) 232 231.25 207  BMI 34.76 34.65 30.55    Physical Exam  Constitutional: She is oriented to person, place, and time. She appears well-developed. No distress.  Cardiovascular: Normal rate and regular rhythm.   Pulmonary/Chest: Effort normal and breath sounds normal. She has no wheezes. She has no rales.  Neurological: She is alert and oriented to person, place, and time.  Psychiatric: She has a normal mood and affect.  Vitals  reviewed.   Lab Results  Component Value Date   WBC 6.5 06/05/2016   HGB 13.9 06/05/2016   HCT 41.2 06/05/2016   PLT 233.0 06/05/2016   GLUCOSE 104 (H) 06/05/2016   CHOL 190 06/05/2016   TRIG 136.0 06/05/2016   HDL 38.60 (L) 06/05/2016   LDLCALC 124 (H) 06/05/2016   ALT 17 06/05/2016   AST 16 06/05/2016   NA 140 06/05/2016   K 3.8 06/05/2016   CL 107 06/05/2016   CREATININE 0.99 06/05/2016   BUN 19 06/05/2016   CO2 24 06/05/2016   TSH 3.58 06/05/2016   HGBA1C 5.7 06/05/2016   MICROALBUR 8.7 (H) 03/22/2015    Assessment & Plan:   Problem List Items Addressed This Visit    Hypertension    Improved but uncontrolled. Increasing clonidine patch. Continue amlodipine, losartan, hydralazine.      Relevant Medications   cloNIDine (CATAPRES - DOSED IN MG/24 HR) 0.3 mg/24hr patch      Meds ordered this encounter  Medications  . cloNIDine (CATAPRES - DOSED IN MG/24 HR) 0.3 mg/24hr patch    Sig: Place 1 patch (0.3 mg total) onto the skin once a week.    Dispense:  12 patch    Refill:  3    Follow-up: 1 month  Lyndi Holbein DO [facility] [place]

## 2016-09-05 NOTE — Assessment & Plan Note (Signed)
Improved but uncontrolled. Increasing clonidine patch. Continue amlodipine, losartan, hydralazine.

## 2016-10-10 ENCOUNTER — Encounter: Payer: Self-pay | Admitting: Family Medicine

## 2016-10-10 ENCOUNTER — Ambulatory Visit (INDEPENDENT_AMBULATORY_CARE_PROVIDER_SITE_OTHER): Payer: BLUE CROSS/BLUE SHIELD | Admitting: Family Medicine

## 2016-10-10 DIAGNOSIS — I10 Essential (primary) hypertension: Secondary | ICD-10-CM

## 2016-10-10 DIAGNOSIS — Z23 Encounter for immunization: Secondary | ICD-10-CM

## 2016-10-10 MED ORDER — HYDRALAZINE HCL 50 MG PO TABS: 100.0000 mg | ORAL_TABLET | Freq: Three times a day (TID) | ORAL | 1 refills | Status: DC

## 2016-10-10 MED ORDER — HYDRALAZINE HCL 100 MG PO TABS: 100.0000 mg | ORAL_TABLET | Freq: Three times a day (TID) | ORAL | 1 refills | Status: DC

## 2016-10-10 NOTE — Progress Notes (Signed)
Subjective:  Patient ID: Audrey Wolf, female    DOB: [birth year]  Age: 66 y.o. MRN: [id number]  CC: Follow up HTN  HPI:  66 year old female presents for follow-up regarding hypertension.  BP still uncontrolled. Pressures are quite labile but are persistently elevated in the 180s systolic at home. She endorses compliance with clonidine, hydralazine, losartan, and amlodipine. No reported hypertension. No side effects from medications. No reported symptoms. She states that she feels well. No other complaints or concerns at this time.  Social Hx   Social History   Social History  . Marital status: Single    Spouse name: N/A  . Number of children: N/A  . Years of education: N/A   Occupational History  .  [employer]   Social History Main Topics  . Smoking status: Never Smoker  . Smokeless tobacco: Never Used  . Alcohol use No  . Drug use: No  . Sexual activity: No   Other Topics Concern  . None   Social History Narrative  . None    Review of Systems  Constitutional: Negative.   Respiratory: Negative.   Cardiovascular: Negative.    Objective:  BP (!) 180/68 (BP Location: Left Arm, Patient Position: Sitting, Cuff Size: Large)   Pulse 86   Temp 98.8 F (37.1 C) (Oral)   Wt 232 lb 8 oz (105.5 kg)   SpO2 98%   BMI 34.84 kg/m   BP/Weight 10/10/2016 09/05/2016 06/05/2016  Systolic BP 180 150 196  Diastolic BP 68 100 100  Wt. (Lbs) 232.5 232 231.25  BMI 34.84 34.76 34.65    Physical Exam  Constitutional: She is oriented to person, place, and time. She appears well-developed. No distress.  Cardiovascular: Normal rate and regular rhythm.   Pulmonary/Chest: Effort normal. She has no wheezes. She has no rales.  Neurological: She is alert and oriented to person, place, and time.  Psychiatric: She has a normal mood and affect.  Vitals reviewed.   Lab Results  Component Value Date   WBC 6.5 06/05/2016   HGB 13.9 06/05/2016   HCT 41.2 06/05/2016   PLT 233.0  06/05/2016   GLUCOSE 104 (H) 06/05/2016   CHOL 190 06/05/2016   TRIG 136.0 06/05/2016   HDL 38.60 (L) 06/05/2016   LDLCALC 124 (H) 06/05/2016   ALT 17 06/05/2016   AST 16 06/05/2016   NA 140 06/05/2016   K 3.8 06/05/2016   CL 107 06/05/2016   CREATININE 0.99 06/05/2016   BUN 19 06/05/2016   CO2 24 06/05/2016   TSH 3.58 06/05/2016   HGBA1C 5.7 06/05/2016   MICROALBUR 8.7 (H) 03/22/2015    Assessment & Plan:   Problem List Items Addressed This Visit    Hypertension    Uncontrolled. Increasing Hydralazine to 100 mg TID. Continue other meds.  Follow up with Nephrology.      Relevant Medications   hydrALAZINE (APRESOLINE) 100 MG tablet    Other Visit Diagnoses    Encounter for immunization       Relevant Orders   Flu vaccine HIGH DOSE PF (Completed)      Meds ordered this encounter  Medications  . DISCONTD: hydrALAZINE (APRESOLINE) 50 MG tablet    Sig: Take 2 tablets (100 mg total) by mouth 3 (three) times daily.    Dispense:  270 tablet    Refill:  1  . hydrALAZINE (APRESOLINE) 100 MG tablet    Sig: Take 1 tablet (100 mg total) by mouth 3 (three) times daily.  Dispense:  270 tablet    Refill:  1    Use this Rx (for 100 mg tabs instead of the prior one for 50 mg tabs).   Follow-up: Return in about 3 months (around 01/09/2017).  Everlene Other DO [facility]

## 2016-10-10 NOTE — Assessment & Plan Note (Signed)
Uncontrolled. Increasing Hydralazine to 100 mg TID. Continue other meds.  Follow up with Nephrology.

## 2016-10-10 NOTE — Patient Instructions (Signed)
I have increased your Hydralazine.  Follow up with with Nephrology.  Take care  Dr. Adriana Simasook

## 2016-10-23 ENCOUNTER — Ambulatory Visit (INDEPENDENT_AMBULATORY_CARE_PROVIDER_SITE_OTHER): Payer: BLUE CROSS/BLUE SHIELD | Admitting: Family Medicine

## 2016-10-23 ENCOUNTER — Encounter: Payer: Self-pay | Admitting: Family Medicine

## 2016-10-23 VITALS — BP 220/110 | HR 74 | Temp 98.3°F | Wt 231.1 lb

## 2016-10-23 DIAGNOSIS — R1033 Periumbilical pain: Secondary | ICD-10-CM

## 2016-10-23 LAB — COMPREHENSIVE METABOLIC PANEL
ALT: 15 U/L (ref 0–35)
AST: 14 U/L (ref 0–37)
Albumin: 4.1 g/dL (ref 3.5–5.2)
Alkaline Phosphatase: 80 U/L (ref 39–117)
BUN: 20 mg/dL (ref 6–23)
CHLORIDE: 109 meq/L (ref 96–112)
CO2: 23 meq/L (ref 19–32)
Calcium: 9.4 mg/dL (ref 8.4–10.5)
Creatinine, Ser: 1.46 mg/dL — ABNORMAL HIGH (ref 0.40–1.20)
GFR: 38 mL/min — ABNORMAL LOW (ref >60.00)
GLUCOSE: 91 mg/dL (ref 70–99)
Potassium: 3.6 mEq/L (ref 3.5–5.1)
SODIUM: 141 meq/L (ref 135–145)
TOTAL PROTEIN: 6.5 g/dL (ref 6.0–8.3)
Total Bilirubin: 0.6 mg/dL (ref 0.2–1.2)

## 2016-10-23 LAB — CBC
HCT: 38 % (ref 36.0–46.0)
Hemoglobin: 12.9 g/dL (ref 12.0–15.0)
MCHC: 34.1 g/dL (ref 30.0–36.0)
MCV: 92.3 fl (ref 78.0–100.0)
Platelets: 204 10*3/uL (ref 150.0–400.0)
RBC: 4.11 Mil/uL (ref 3.87–5.11)
RDW: 13.4 % (ref 11.5–15.5)
WBC: 6.7 10*3/uL (ref 4.0–10.5)

## 2016-10-23 LAB — LIPASE: Lipase: 11 U/L (ref 11.0–59.0)

## 2016-10-23 NOTE — Patient Instructions (Signed)
We will call with the lab results.  Let me know if anything changes.  Take care  Dr. Adriana Simas

## 2016-10-23 NOTE — Progress Notes (Signed)
Subjective:  Patient ID: Audrey Wolf, female    DOB: 22-Feb-1950  Age: 66 y.o. MRN: [id number]  CC: Abdominal pain  HPI:  66 year old female with CKD, severe HTN, HLD presents with the above complaint.  Patient reports that 2 days ago she developed periumbilical abdominal pain. She states that it occurred at night and woke her out of sleep. She states it was 8 out of 10 in severity. She describes as cramping. She reports associated left thoracic pain. Pain subsequently resolved and she has not had much pain since that time. She's had no fevers or chills. No urinary symptoms. No diarrhea or constipation. No other associated symptoms. Just pain. She's feeling well today. However, given her recent severe abdominal pain she thought she should be evaluated.  Social Hx   Social History   Social History  . Marital status: Single    Spouse name: N/A  . Number of children: N/A  . Years of education: N/A   Occupational History  .  [employer]   Social History Main Topics  . Smoking status: Never Smoker  . Smokeless tobacco: Never Used  . Alcohol use No  . Drug use: No  . Sexual activity: No   Other Topics Concern  . None   Social History Narrative  . None    Review of Systems  Constitutional: Negative.   Gastrointestinal: Positive for abdominal pain. Negative for constipation and diarrhea.  Genitourinary: Negative.   Musculoskeletal: Positive for back pain.   Objective:  BP (!) 220/110 (BP Location: Left Arm, Patient Position: Sitting, Cuff Size: Normal)   Pulse 74   Temp 98.3 F (36.8 C) (Oral)   Wt 231 lb 2 oz (104.8 kg)   SpO2 97%   BMI 34.63 kg/m   BP/Weight 10/23/2016 10/10/2016 09/05/2016  Systolic BP 220 180 150  Diastolic BP 110 68 100  Wt. (Lbs) 231.13 232.5 232  BMI 34.63 34.84 34.76    Physical Exam  Constitutional: She is oriented to person, place, and time. She appears well-developed. No distress.  Cardiovascular: Normal rate and regular rhythm.     Pulmonary/Chest: Effort normal. She has no wheezes. She has no rales.  Abdominal: Soft. There is no rebound and no guarding.  Nondistended. Mild to moderate tenderness to palpation just above the umbilicus.  Neurological: She is alert and oriented to person, place, and time.  Psychiatric: She has a normal mood and affect.  Vitals reviewed.   Lab Results  Component Value Date   WBC 6.5 06/05/2016   HGB 13.9 06/05/2016   HCT 41.2 06/05/2016   PLT 233.0 06/05/2016   GLUCOSE 104 (H) 06/05/2016   CHOL 190 06/05/2016   TRIG 136.0 06/05/2016   HDL 38.60 (L) 06/05/2016   LDLCALC 124 (H) 06/05/2016   ALT 17 06/05/2016   AST 16 06/05/2016   NA 140 06/05/2016   K 3.8 06/05/2016   CL 107 06/05/2016   CREATININE 0.99 06/05/2016   BUN 19 06/05/2016   CO2 24 06/05/2016   TSH 3.58 06/05/2016   HGBA1C 5.7 06/05/2016   MICROALBUR 8.7 (H) 03/22/2015    Assessment & Plan:   Problem List Items Addressed This Visit    Periumbilical abdominal pain - Primary    New problem. Uncertain etiology/prognosis at this time. She is currently well appearing. She does have some tenderness on exam. I discussed starting workup with laboratory studies and patient was in agreement. I offered CT scan and patient would like to wait.  Relevant Orders   CBC   Comprehensive metabolic panel   Lipase      Follow-up: PRN  Everlene Other DO [facility]

## 2016-10-23 NOTE — Assessment & Plan Note (Signed)
New problem. Uncertain etiology/prognosis at this time. She is currently well appearing. She does have some tenderness on exam. I discussed starting workup with laboratory studies and patient was in agreement. I offered CT scan and patient would like to wait.

## 2016-10-27 ENCOUNTER — Other Ambulatory Visit: Payer: Self-pay | Admitting: Family Medicine

## 2016-10-27 DIAGNOSIS — I10 Essential (primary) hypertension: Secondary | ICD-10-CM

## 2016-10-30 ENCOUNTER — Ambulatory Visit (INDEPENDENT_AMBULATORY_CARE_PROVIDER_SITE_OTHER): Payer: BLUE CROSS/BLUE SHIELD

## 2016-10-30 ENCOUNTER — Other Ambulatory Visit (INDEPENDENT_AMBULATORY_CARE_PROVIDER_SITE_OTHER): Payer: BLUE CROSS/BLUE SHIELD

## 2016-10-30 DIAGNOSIS — I10 Essential (primary) hypertension: Secondary | ICD-10-CM

## 2016-10-30 LAB — BASIC METABOLIC PANEL
BUN: 20 mg/dL (ref 6–23)
CALCIUM: 8.9 mg/dL (ref 8.4–10.5)
CO2: 25 meq/L (ref 19–32)
CREATININE: 1.25 mg/dL — AB (ref 0.40–1.20)
Chloride: 108 mEq/L (ref 96–112)
GFR: 45.46 mL/min — ABNORMAL LOW (ref >60.00)
GLUCOSE: 90 mg/dL (ref 70–99)
Potassium: 3.7 mEq/L (ref 3.5–5.1)
Sodium: 139 mEq/L (ref 135–145)

## 2016-10-30 NOTE — Addendum Note (Signed)
Addended by: Glori Luis on: 10/30/2016 04:23 PM   Modules accepted: Orders

## 2016-10-30 NOTE — Progress Notes (Signed)
BP still above goal. I would like to see what her lab work comes back as today and then consider addition of other medications. Please see if we can get her follow-up scheduled with [facility] Citizen for her blood pressure. Thanks.

## 2016-10-30 NOTE — Progress Notes (Addendum)
Patient comes in for nurse blood pressure check. Patient is no longer taking losartan per Dr Adriana Simas recommendations from last office visit 10/23/16 labs.  At last office blood pressure was 220/110.     Blood pressure Left arm 166/80 pulse 75 right arm 162 /80 .  Patient states on Saturday it was checked at [facility] Blood pressure was 168/68.  Patient denied chest pain, edema in lower extremities, shortness of breath, stomach pain .  Per Team Lead ok to let patient go .  Per patient she  Plans to establish with Dr Birdie Sons.   Reviewed.  Reviewed Dr Purvis Sheffield response.  He is following up with labs and plans for f/u appt for blood pressure.    Dr Lorin Picket

## 2016-10-31 NOTE — Progress Notes (Signed)
I signed off on note.  See Dr Purvis Sheffield note regarding f/u on her labs and f/u appt for her blood pressure.

## 2016-10-31 NOTE — Progress Notes (Signed)
Left voice mail to call back 

## 2016-10-31 NOTE — Progress Notes (Signed)
Patient advised of below .  Appointment scheduled with [facility] Citizen pharmacist.  Advised of labs per Dr Shiela Mayer recommendations.  Lab appointment scheduled for 1 month.  Patient states check blood pressure today and was 145/68.

## 2016-11-02 NOTE — Progress Notes (Signed)
Forwarding to Emelle to get her input on best medication to start on.

## 2016-11-10 ENCOUNTER — Ambulatory Visit: Payer: BLUE CROSS/BLUE SHIELD | Admitting: Pharmacist

## 2016-11-10 NOTE — Progress Notes (Deleted)
   S:    Patient arrives ***.    Presents to the clinic for hypertension evaluation. Patient was referred on 10/30/2016.  Patient was last seen by Primary Care Provider on 10/23/2016. Last seen for RN check on 10/30/2016 and BP was elevated. No medications changed at that time. BMET from 10/30/2016 reveals SCr up from baseline but improved at 1.25, K wnl  Of note, patient has CKD - 2, follows with nephrology   Patient {Actions; denies-reports:120008} adherence with medications.  Current BP Medications include:  Amlodipine 10 mg daily, clonidine 0.3 mg patch once weekly, hydralazine 100 mg TID  Antihypertensives tried in the past include: Allergy to ACE-I ***, losartan ***  Dietary habits include:  .medreviewdc   O:   Last 3 Office BP readings: BP Readings from Last 3 Encounters:  10/23/16 (!) 220/110  10/10/16 (!) 180/68  09/05/16 (!) 150/100    BMET    Component Value Date/Time   NA 139 10/30/2016 1343   NA 141 04/03/2016   NA 140 03/01/2012 1132   K 3.7 10/30/2016 1343   K 3.5 03/01/2012 1132   CL 108 10/30/2016 1343   CL 108 (H) 03/01/2012 1132   CO2 25 10/30/2016 1343   CO2 23 03/01/2012 1132   GLUCOSE 90 10/30/2016 1343   GLUCOSE 92 03/01/2012 1132   BUN 20 10/30/2016 1343   BUN 23 (A) 04/03/2016   BUN 22 (H) 03/01/2012 1132   CREATININE 1.25 (H) 10/30/2016 1343   CREATININE 0.99 03/01/2012 1132   CALCIUM 8.9 10/30/2016 1343   CALCIUM 8.6 03/01/2012 1132   GFRNONAA >60 03/22/2015 1629   GFRNONAA >60 03/01/2012 1132   GFRAA >60 03/22/2015 1629   GFRAA >60 03/01/2012 1132    A/P: Hypertension longstanding/newly diagnosed currently *** on current medications.  {Meds adjust:18428} ***.   Results reviewed and written information provided.   Total time in face-to-face counseling *** minutes.   F/U Clinic Visit with Dr. Marland Kitchen  Patient seen with ***

## 2016-11-12 NOTE — Progress Notes (Signed)
Patient scheduled to see Rayfield Citizen , Pharmacist on 11/17/16

## 2016-11-17 ENCOUNTER — Encounter: Payer: Self-pay | Admitting: Pharmacist

## 2016-11-17 ENCOUNTER — Ambulatory Visit (INDEPENDENT_AMBULATORY_CARE_PROVIDER_SITE_OTHER): Payer: BLUE CROSS/BLUE SHIELD | Admitting: Pharmacist

## 2016-11-17 DIAGNOSIS — I10 Essential (primary) hypertension: Secondary | ICD-10-CM | POA: Diagnosis not present

## 2016-11-17 MED ORDER — CARVEDILOL 3.125 MG PO TABS: 3.1250 mg | ORAL_TABLET | Freq: Two times a day (BID) | ORAL | 3 refills | Status: DC

## 2016-11-17 NOTE — Progress Notes (Addendum)
S:    Patient arrives in good spirits, ambulating without assistance.  Presents to the clinic for hypertension evaluation. Patient was referred on 10/30/2016.  Patient was last seen by Primary Care Provider on 10/23/2016. Patient referred as she called in stating [facility], was seen for RN BP check and it was found to be elevated again. Patient has plans to establish care with Dr. Birdie Sons. Being followed by Dr. Achilles Dunk (urology) and Dr. Cherylann Ratel (nephrology) per patient. Has been told to avoid ACE-inhibitors due to history of urethral stricture and unilateral small kidney.   Was previously on losartan however this was held on 10/23/2016 per Dr. Adriana Simas as BMET revealed elevated SCr. Repeat BMET on 10/30/2016 showed improved SCr to 1.25, however still above baseline.   Since last visit, patient reports adherence with medications, however middle of the day hydralazine is hard to remember. If she takes them too early she gets sleepy. Just signed up for Part D insurance plan but hasn't gotten part D card yet. Has f/u with nephrologist on 11/8.   Patient reports home SBP to be in 160s and as low as 145.   Denies CP, dizziness, falls, visual changes, but does get sleepy when taking meds. Has a hard time sleeping at night. Endorses stress with work.   Current BP Medications include:  Amlodipine 10 mg daily, clonidine 0.3 mg patch, hydralazine 100 mg TID.   Antihypertensives tried in the past include: losartan (stopped for increase in SCr)   Dietary habits include: Doesn't eat fruit or vegetables - has been a picky eater all her life per patient Breakfast - sausage and egg biscuit or chicken platter  - goes out Lunch - [employer] sandwich and [employer] - sandwich/spaghetti Drinks - sprite, no caffeine intake   Walks around [employer] while at works - works in [employer].   O:   Last 3 Office BP readings: BP Readings from Last 3 Encounters:  11/17/16 (!) 210/78  10/23/16 (!) 220/110   10/10/16 (!) 180/68    BMET    Component Value Date/Time   NA 139 10/30/2016 1343   NA 141 04/03/2016   NA 140 03/01/2012 1132   K 3.7 10/30/2016 1343   K 3.5 03/01/2012 1132   CL 108 10/30/2016 1343   CL 108 (H) 03/01/2012 1132   CO2 25 10/30/2016 1343   CO2 23 03/01/2012 1132   GLUCOSE 90 10/30/2016 1343   GLUCOSE 92 03/01/2012 1132   BUN 20 10/30/2016 1343   BUN 23 (A) 04/03/2016   BUN 22 (H) 03/01/2012 1132   CREATININE 1.25 (H) 10/30/2016 1343   CREATININE 0.99 03/01/2012 1132   CALCIUM 8.9 10/30/2016 1343   CALCIUM 8.6 03/01/2012 1132   GFRNONAA >60 03/22/2015 1629   GFRNONAA >60 03/01/2012 1132   GFRAA >60 03/22/2015 1629   GFRAA >60 03/01/2012 1132   CrCl = 72 ml/min   A/P: Hypertension longstanding currently uncontrolled on current medications, complicated by concomitant unilateral ureteral stricture. Scr down from previous but still elevated, K wnl and stable. Continues to hold losartan. It appears that patient was on carvedilol at one point in time per chart review, unclear when this was stopped. Patient denies ever taking carvedilol. Patient with room for considerable improvement regarding sodium intake. Advised patient to go to emergency department as BP is so high she is at considerable risk of stroke. Patient declines at this time.  Following discussion and approval by Dr. Lorin Picket, the following medication  changes were made:  -Start carvedilol 3.125 mg BID -Continue other medications -Check BMET at next appointment and consider restart losartan if SCr improved -Counseled on s/sx stroke and advised her to call 911 and go to emergency department  if s/sx stroke develop.  -Counseled on dietary sodium sources and discussed changing breakfast and cutting out chips with lunch at minimum.  Patient was seen with Dr. Lorin Picket today in clinic and medication changes were discussed and approved prior to initiation. Case was also discussed with Dr. Birdie Sons.   Results  reviewed and written information provided.   Total time in face-to-face counseling 30 minutes.   F/U Clinic Visit with myself or Dr. Birdie Sons in 2 weeks, Dr. Birdie Sons in 1 month at minimum.    Allena Katz, Pharm.D. PGY2 Ambulatory Care Pharmacy Resident Phone: [phone]   Reviewed above information.  Pt was instructed to go to ER for evaluation given increased blood pressure - remaining over 200 systolic.  Refused.  Was given rx for new blood pressure medication - coreg.  Needs f/u with nephrology.    Dr Lorin Picket

## 2016-11-17 NOTE — Assessment & Plan Note (Signed)
Hypertension longstanding currently uncontrolled on current medications, complicated by concomitant unilateral ureteral stricture. Scr down from previous but still elevated, K wnl and stable. Continues to hold losartan. It appears that patient was on carvedilol at one point in time per chart review, unclear when this was stopped. Patient denies ever taking carvedilol. Patient with room for considerable improvement regarding sodium intake. Advised patient to go to emergency department as BP is so high she is at considerable risk of stroke. Patient declines at this time.  Following discussion and approval by Dr. Lorin Picket, the following medication changes were made:  -Start carvedilol 3.125 mg BID -Continue other medications -Check BMET at next appointment and consider restart losartan if SCr improved -Counseled on s/sx stroke and advised her to call 911 and go to emergency department  if s/sx stroke develop.  -Counseled on dietary sodium sources and discussed changing breakfast and cutting out chips with lunch at minimum.  Patient was seen with Dr. Lorin Picket today in clinic and medication changes were discussed and approved prior to initiation. Case was also discussed with Dr. Birdie Sons.

## 2016-11-17 NOTE — Patient Instructions (Addendum)
Thank you for coming to see me. We advise that you go to the emergency room because your blood pressure is too high and you are at a high risk for stroke.   1. Work on sodium/salt in [employer]. Cut out chips and switch to cereal for breakfast instead.   2. Start carvedilol 3.125 mg twice a day  3. Please follow up with Dr. Birdie Sons on the 29th. Please bring in your book.

## 2016-12-01 ENCOUNTER — Encounter: Payer: Self-pay | Admitting: Pharmacist

## 2016-12-01 ENCOUNTER — Ambulatory Visit (INDEPENDENT_AMBULATORY_CARE_PROVIDER_SITE_OTHER): Payer: BLUE CROSS/BLUE SHIELD | Admitting: Pharmacist

## 2016-12-01 ENCOUNTER — Other Ambulatory Visit: Payer: BLUE CROSS/BLUE SHIELD

## 2016-12-01 DIAGNOSIS — I10 Essential (primary) hypertension: Secondary | ICD-10-CM

## 2016-12-01 LAB — BASIC METABOLIC PANEL
BUN: 27 mg/dL — AB (ref 6–23)
CALCIUM: 9.2 mg/dL (ref 8.4–10.5)
CO2: 24 mEq/L (ref 19–32)
Chloride: 107 mEq/L (ref 96–112)
Creatinine, Ser: 0.96 mg/dL (ref 0.40–1.20)
GFR: 61.63 mL/min (ref >60.00)
GLUCOSE: 104 mg/dL — AB (ref 70–99)
Potassium: 3.9 mEq/L (ref 3.5–5.1)
Sodium: 140 mEq/L (ref 135–145)

## 2016-12-01 NOTE — Progress Notes (Signed)
I have reviewed the above note and agree. I saw the patient with the pharmacist.  Arrionna Serena, M.D.  

## 2016-12-01 NOTE — Patient Instructions (Signed)
Your blood pressure is better but definitely still high.   We will draw blood today and see how your kidneys are doing. If they look better, we can restart losartan at a lower dose.   We will call you with the plan.

## 2016-12-01 NOTE — Assessment & Plan Note (Signed)
Hypertension longstanding currently uncontrolled on current medications, complicated by concomitant unilateral ureteral stricture. Scr down from previous but still elevated, K wnl and stable per last labs. Continues to hold losartan. Patient with room for considerable improvement regarding sodium intake.  Following discussion and approval by Dr. Birdie SonsSonnenberg, the following medication changes were made:  - BMET today, planning to restart losartan at low dose if SCr and electolytes stable - Continue other meds - Extensive dietary counseling done, congratulated on progress so far.  - F/u with nephrology and urology as planned

## 2016-12-01 NOTE — Progress Notes (Signed)
S:    Patient arrives in good spirits, ambulating without assistance.    Presents to the clinic for hypertension evaluation. Patient was referred on 10/30/16.  Patient was last seen by Primary Care Provider on 10/23/2016. Was previously on losartan however this was held on 10/23/2016 per Dr. Adriana Simasook as BMET revealed elevated SCr. Repeat BMET on 10/30/2016 showed improved SCr to 1.25, however still above baseline. Patient has plans to establish care with Dr. Birdie SonsSonnenberg. Being followed by Dr. Achilles Dunkope (urology) and Dr. Cherylann RatelLateef (nephrology) per patient. Sees nephrologist 12/11/16, Urology on 02/26/2017. Has been told to avoid ACE-inhibitors due to history of urethral stricture and unilateral small kidney. Has had kidney issues since 2010 which is when the BP issues began.   At last Rx clinic visit on 11/17/2016, patient was instructed to reduce dietary sodium, record BP at home, and start carvedilol 3.125 mg BID.   Today, patient reports that she has improved her diet by cutting out breakfast sandwiches and chips. Does still eat a lot of peanut butter crackers and lunch meats/processed foods. Started carvedilol and endorses some fatigue but this is not bothersome.   Denies CP, dizziness, falls, visual changes, slurred speech Has a hard time sleeping at night because she falls asleep so early. Endorses stress with work.   Current BP Medications include:  Amlodipine 10 mg daily, clonidine 0.3 mg patch, hydralazine 100 mg TID, carvedilol 3.125 mg BID.   Antihypertensives tried in the past include: losartan (held for increase in SCr), enalapril (cough)   Dietary habits include: Doesn't eat fruit or vegetables - has been a picky eater all her life per patient Breakfast - cereal or eggs Lunch - Subway sandwich Dinner - sandwich/spaghetti Drinks - sprite, no caffeine intake  Snacks - granola bar, PB crackers  O:   Last 3 Office BP readings: BP Readings from Last 3 Encounters:  12/01/16 (!) 180/97  11/17/16  (!) 210/78  10/23/16 (!) 220/110    Home readings: lows 170s- 200s/80s-90s, HR is 50s-70s  BMET    Component Value Date/Time   NA 139 10/30/2016 1343   NA 141 04/03/2016   NA 140 03/01/2012 1132   K 3.7 10/30/2016 1343   K 3.5 03/01/2012 1132   CL 108 10/30/2016 1343   CL 108 (H) 03/01/2012 1132   CO2 25 10/30/2016 1343   CO2 23 03/01/2012 1132   GLUCOSE 90 10/30/2016 1343   GLUCOSE 92 03/01/2012 1132   BUN 20 10/30/2016 1343   BUN 23 (A) 04/03/2016   BUN 22 (H) 03/01/2012 1132   CREATININE 1.25 (H) 10/30/2016 1343   CREATININE 0.99 03/01/2012 1132   CALCIUM 8.9 10/30/2016 1343   CALCIUM 8.6 03/01/2012 1132   GFRNONAA >60 03/22/2015 1629   GFRNONAA >60 03/01/2012 1132   GFRAA >60 03/22/2015 1629   GFRAA >60 03/01/2012 1132    A/P: Hypertension longstanding currently uncontrolled on current medications, complicated by concomitant unilateral ureteral stricture. Scr down from previous but still elevated, K wnl and stable per last labs. Continues to hold losartan. Patient with room for considerable improvement regarding sodium intake.  Following discussion and approval by Dr. Birdie SonsSonnenberg, the following medication changes were made:  - BMET today, planning to restart losartan at low dose if SCr and electolytes stable - Continue other meds - Extensive dietary counseling done, congratulated on progress so far - F/u with nephrology and urology as planned   Results reviewed and written information provided.   Total time in face-to-face counseling 30 minutes.  F/U Clinic Visit in ~ 1 month.    Patient was seen with Dr. Birdie Sons today in clinic and medication changes were discussed and approved prior to initiation.   Allena Katz, Pharm.D. PGY2 Ambulatory Care Pharmacy Resident Phone: [phone]

## 2016-12-02 ENCOUNTER — Telehealth: Payer: Self-pay | Admitting: Pharmacist

## 2016-12-02 MED ORDER — LOSARTAN POTASSIUM 25 MG PO TABS: 25.0000 mg | ORAL_TABLET | Freq: Every day | ORAL | 1 refills | Status: DC

## 2016-12-02 NOTE — Progress Notes (Signed)
F/u BMET with SCr improved to 0.96 (baseline ~1), K wnl.   After discussion with Dr. Birdie SonsSonnenberg, will send Rx for losartan 25 mg once daily. Patient has f/u with her nephrologist on 12/11/2016 and asked patient to have labs drawn at that visit for f/u BMET. Called patient to relay above information - no answer. Left HIPAA-compliant VM requesting she call back.   Allena Katzaroline E Eldine Rencher, Pharm.D. PGY2 Ambulatory Care Pharmacy Resident Phone: [phone]

## 2016-12-02 NOTE — Addendum Note (Signed)
Addended by: Devota PaceWELLES, CAROLINE E on: 12/02/2016 09:17 AM   Modules accepted: Orders

## 2016-12-02 NOTE — Telephone Encounter (Signed)
Pt returned call verbalizing understanding of plan.   Allena Katzaroline E Welles, Pharm.D. PGY2 Ambulatory Care Pharmacy Resident Phone: [phone]

## 2016-12-11 DIAGNOSIS — N183 Chronic kidney disease, stage 3 (moderate): Secondary | ICD-10-CM | POA: Diagnosis not present

## 2016-12-11 DIAGNOSIS — I10 Essential (primary) hypertension: Secondary | ICD-10-CM | POA: Diagnosis not present

## 2016-12-11 DIAGNOSIS — E559 Vitamin D deficiency, unspecified: Secondary | ICD-10-CM | POA: Diagnosis not present

## 2016-12-11 DIAGNOSIS — N1330 Unspecified hydronephrosis: Secondary | ICD-10-CM | POA: Diagnosis not present

## 2017-01-01 ENCOUNTER — Ambulatory Visit: Payer: BLUE CROSS/BLUE SHIELD | Attending: Neurology

## 2017-04-06 DIAGNOSIS — J209 Acute bronchitis, unspecified: Secondary | ICD-10-CM | POA: Diagnosis not present

## 2017-04-06 DIAGNOSIS — J069 Acute upper respiratory infection, unspecified: Secondary | ICD-10-CM | POA: Diagnosis not present

## 2017-04-06 DIAGNOSIS — R05 Cough: Secondary | ICD-10-CM | POA: Diagnosis not present

## 2017-04-09 ENCOUNTER — Encounter: Payer: Self-pay | Admitting: Family Medicine

## 2017-04-09 ENCOUNTER — Ambulatory Visit (INDEPENDENT_AMBULATORY_CARE_PROVIDER_SITE_OTHER): Payer: BLUE CROSS/BLUE SHIELD | Admitting: Family Medicine

## 2017-04-09 VITALS — BP 162/92 | HR 75 | Temp 98.2°F | Resp 20 | Wt 218.2 lb

## 2017-04-09 DIAGNOSIS — J209 Acute bronchitis, unspecified: Secondary | ICD-10-CM | POA: Diagnosis not present

## 2017-04-09 DIAGNOSIS — I10 Essential (primary) hypertension: Secondary | ICD-10-CM | POA: Diagnosis not present

## 2017-04-09 MED ORDER — AZITHROMYCIN 250 MG PO TABS: ORAL_TABLET | ORAL | 0 refills | Status: DC

## 2017-04-09 MED ORDER — PREDNISONE 10 MG PO TABS: ORAL_TABLET | ORAL | 0 refills | Status: DC

## 2017-04-09 NOTE — Progress Notes (Signed)
Patient ID: Audrey Wolf, female   DOB: 01-Jun-1950, 67 y.o.   MRN: [id number] PCP: Glori LuisSonnenberg, Eric G, MD  Subjective:  Audrey Wolf is a [age] y.o. year old very pleasant female patient who presents with  symptoms including nasal congestion,  cough, chest congestion.  - does have wheeze as well -started: two weeks ago, symptoms are not improving -previous treatments: [facility]. She has started amoxicillin, Virtussin,  -sick contacts/travel/risks: denies flu exposure. Influenza is UTD She is followed closely by nephrology and she states that she has told she is taking "too much medication"  Elevated Blood pressure: She reports monitoring her BP at home. Systolic averages are noted as 150s  and diastolic averages of 80s to low 90s. She denies chest pain, palpitations, SOB, numbness, tingling, weakness, headaches, or edema. She is adherent with her medications and has just taken them prior to visit today. She reports that she has had a decreased appetite with coughing.   ROS-denies fever, NVD, tooth pain. Denies significant shortness of breath.   Pertinent Past Medical History- CKD stage 2, HTN  Patient Active Problem List   Diagnosis Date Noted  . Periumbilical abdominal pain 10/23/2016  . Hypertension 06/05/2016  . Hyperlipidemia 06/05/2016  . GERD (gastroesophageal reflux disease) 06/05/2016  . CKD (chronic kidney disease) stage 2, GFR 60-89 ml/min 06/05/2016  . Ureteral stricture, left 03/29/2014  . Unilateral small kidney 03/02/2013    Medications- reviewed  Current Outpatient Medications  Medication Sig Dispense Refill  . amLODipine (NORVASC) 10 MG tablet Take 10 mg by mouth daily.     Marland Kitchen. amoxicillin (AMOXIL) 500 MG capsule     . ASPIRIN 81 PO Take 81 mg by mouth.    . carvedilol (COREG) 3.125 MG tablet Take 1 tablet (3.125 mg total) by mouth 2 (two) times daily with a meal. 60 tablet 3  . cloNIDine (CATAPRES - DOSED IN MG/24 HR) 0.3 mg/24hr patch  Place 1 patch (0.3 mg total) onto the skin once a week. 12 patch 3  . hydrALAZINE (APRESOLINE) 100 MG tablet Take 1 tablet (100 mg total) by mouth 3 (three) times daily. 270 tablet 1  . loratadine (CLARITIN) 10 MG tablet Take 10 mg by mouth daily.     Marland Kitchen. losartan (COZAAR) 25 MG tablet Take 1 tablet (25 mg total) by mouth daily. 30 tablet 1  . RaNITidine HCl (ZANTAC PO) Take 150 mg by mouth daily.     . rosuvastatin (CRESTOR) 20 MG tablet Take 1 tablet (20 mg total) by mouth daily. 90 tablet 3  . VIRTUSSIN A/C 100-10 MG/5ML syrup      No current facility-administered medications for this visit.     Objective: BP (!) 210/100 (BP Location: Left Arm, Patient Position: Sitting, Cuff Size: Large)   Pulse 75   Temp 98.2 F (36.8 C) (Oral)   Resp 20   Wt 218 lb 4 oz (99 kg)   SpO2 98%   BMI 32.70 kg/m  Gen: NAD, resting comfortably HEENT: Turbinates erythematous, TMs normal, pharynx mildly erythematous with no tonsilar exudate or edema, no sinus tenderness CV: RRR no murmurs rubs or gallops Lungs: CTAB no crackles, wheeze, rhonchi  Ext: no edema Skin: warm, dry, no rash  Assessment/Plan: 1. Acute bronchitis, unspecified organism History and exam are most consistent with bronchitis. No improvement with Amoxicillin that was started 2 days ago. We discussed that this is likely viral however with duration of symptoms and no improvement will stop  amoxicillin and start azithromycin today with prednisone taper. Last GFR: 61.63 and creatinine was 0.96 on 12/01/16. Lungs CTA and VSS which  is reassuring for low suspicion of pneumonia.. Advised that if her symptoms were to worsen she should let us know. She has a cough suppressant that was provided 2 days ago. She was provided strict return precautions.  azithromycin (ZITHROMAX) 250 MG tablet; Take 2 tablets at once today, then one tablet daily for four days.  Dispense: 6 tablet; Refill: 0 - predniSONE (DELTASONE) 10 MG tablet; Take 4 tablets once  daily for 2 days, 3 tabs daily for 2 days, 2 tabs daily for 2 days, 1 tab daily for 2 days.  Dispense: 20 tablet; Refill: 0  2. Hypertension, essential Retake of BP: 162/92. Recently taken medications just prior to this visit. Advised her to continue monitoring her BP and follow up for an establish care visit with PCP. Further advised avoidance of salt in diet. She agreed to follow up if BP is >150/90.   Likely course of 3-6 weeks. Patient is contagious and advised good handwashing and consideration of mask If going to be in public places.     Inez Catalina, FNP

## 2017-04-09 NOTE — Patient Instructions (Addendum)
It was a pleasure meeting you today!  I have sent in a prescription for prednisone. This is a steroid which will help with inflammation.   Use the cough syrup at night that was prescribed to you previously as it can make you sleepy.   Stop using amoxicillin and start azithromycin  Continue monitoring your blood pressure and follow up if blood pressure is >150/90.  Please schedule an establish care visit with Dr. De NurseSonneberg.  Follow up if no improvement in the next 2-3 days.   Acute Bronchitis, Adult Acute bronchitis is when air tubes (bronchi) in the lungs suddenly get swollen. The condition can make it hard to breathe. It can also cause these symptoms:  A cough.  Coughing up clear, yellow, or green mucus.  Wheezing.  Chest congestion.  Shortness of breath.  A fever.  Body aches.  Chills.  A sore throat.  Follow these instructions at home: Medicines  Take over-the-counter and prescription medicines only as told by your doctor.  If you were prescribed an antibiotic medicine, take it as told by your doctor. Do not stop taking the antibiotic even if you start to feel better. General instructions  Rest.  Drink enough fluids to keep your pee (urine) clear or pale yellow.  Avoid smoking and secondhand smoke. If you smoke and you need help quitting, ask your doctor. Quitting will help your lungs heal faster.  Use an inhaler, cool mist vaporizer, or humidifier as told by your doctor.  Keep all follow-up visits as told by your doctor. This is important. How is this prevented? To lower your risk of getting this condition again:  Wash your hands often with soap and water. If you cannot use soap and water, use hand sanitizer.  Avoid contact with people who have cold symptoms.  Try not to touch your hands to your mouth, nose, or eyes.  Make sure to get the flu shot every year.  Contact a doctor if:  Your symptoms do not get better in 2 weeks. Get help right  away if:  You cough up blood.  You have chest pain.  You have very bad shortness of breath.  You become dehydrated.  You faint (pass out) or keep feeling like you are going to pass out.  You keep throwing up (vomiting).  You have a very bad headache.  Your fever or chills gets worse. This information is not intended to replace advice given to you by your health care provider. Make sure you discuss any questions you have with your health care provider. Document Released: 07/09/2007 Document Revised: 08/29/2015 Document Reviewed: 07/11/2015 Elsevier Interactive Patient Education  [employer]2018 [employer]

## 2017-04-13 ENCOUNTER — Telehealth: Payer: Self-pay

## 2017-04-13 NOTE — Telephone Encounter (Signed)
Thank you, if you need anything further from me, just let me know.

## 2017-04-13 NOTE — Telephone Encounter (Signed)
Patient was seen 04/09/17 and was given a return to work note for 04/13/17. Patient comes in to office today and states her employer required the note to states she is able to return with no restrictions. I have created a new letter stating she can go back to work 04/13/17 with no restrictions. Letter given to patient.

## 2017-04-20 ENCOUNTER — Encounter: Payer: Self-pay | Admitting: Family Medicine

## 2017-04-20 ENCOUNTER — Ambulatory Visit (INDEPENDENT_AMBULATORY_CARE_PROVIDER_SITE_OTHER): Payer: BLUE CROSS/BLUE SHIELD | Admitting: Family Medicine

## 2017-04-20 ENCOUNTER — Other Ambulatory Visit: Payer: Self-pay

## 2017-04-20 VITALS — BP 170/60 | HR 80 | Temp 97.5°F | Wt 225.0 lb

## 2017-04-20 DIAGNOSIS — R0609 Other forms of dyspnea: Secondary | ICD-10-CM

## 2017-04-20 DIAGNOSIS — J40 Bronchitis, not specified as acute or chronic: Secondary | ICD-10-CM | POA: Insufficient documentation

## 2017-04-20 DIAGNOSIS — R0600 Dyspnea, unspecified: Secondary | ICD-10-CM | POA: Insufficient documentation

## 2017-04-20 DIAGNOSIS — R0602 Shortness of breath: Secondary | ICD-10-CM | POA: Insufficient documentation

## 2017-04-20 DIAGNOSIS — I10 Essential (primary) hypertension: Secondary | ICD-10-CM | POA: Diagnosis not present

## 2017-04-20 LAB — CBC
HCT: 39.6 % (ref 36.0–46.0)
Hemoglobin: 13.4 g/dL (ref 12.0–15.0)
MCHC: 33.9 g/dL (ref 30.0–36.0)
MCV: 89.7 fl (ref 78.0–100.0)
PLATELETS: 258 10*3/uL (ref 150.0–400.0)
RBC: 4.42 Mil/uL (ref 3.87–5.11)
RDW: 13.5 % (ref 11.5–15.5)
WBC: 10.6 10*3/uL — ABNORMAL HIGH (ref 4.0–10.5)

## 2017-04-20 LAB — BASIC METABOLIC PANEL
BUN: 31 mg/dL — ABNORMAL HIGH (ref 6–23)
CALCIUM: 9.1 mg/dL (ref 8.4–10.5)
CO2: 22 meq/L (ref 19–32)
CREATININE: 1.01 mg/dL (ref 0.40–1.20)
Chloride: 109 mEq/L (ref 96–112)
GFR: 58.05 mL/min — AB (ref >60.00)
Glucose, Bld: 104 mg/dL — ABNORMAL HIGH (ref 70–99)
Potassium: 3.7 mEq/L (ref 3.5–5.1)
SODIUM: 139 meq/L (ref 135–145)

## 2017-04-20 MED ORDER — ALBUTEROL SULFATE HFA 108 (90 BASE) MCG/ACT IN AERS
2.0000 | INHALATION_SPRAY | Freq: Four times a day (QID) | RESPIRATORY_TRACT | 0 refills | Status: DC | PRN
Start: 2017-04-20 — End: 2018-08-31

## 2017-04-20 NOTE — Assessment & Plan Note (Addendum)
Symptoms have been improving.  Suspect muscular strain leading to her thoracic back discomfort given tenderness and time course.  She will continue to monitor.  If not improving she will be reevaluated.

## 2017-04-20 NOTE — Progress Notes (Signed)
Audrey AlarEric Beyounce Dickens, MD Phone: [phone]  Audrey Wolf is a 67 y.o. female who presents today for f/u.  HYPERTENSION  Disease Monitoring  Home BP Monitoring notes it is all over the place Chest pain- no    Dyspnea- yes, see below Medications  Compliance-  Taking amlodipine, coreg, clonidine, hydralazine.   Edema- no  Seen recently for bronchitis. Was treated with azithromycin and prednisone. Symptoms have been improving with less cough. She has no production. No fevers. Chronic dyspnea on exertion that is stable and unchanged. No chest pain.  She has been using codeine cough syrup which does help her get some sleep.  She notes after she started coughing she developed some left-sided thoracic back discomfort that occurs mostly if she stands up for a long period of time or if she goes from seated to standing.  Does not hurt with coughing.  Patient reports chronic issues with shortness of breath.  Some days she is fine and other days she has to stop after walking a fairly short distance.  It has not worsened or progressed over the last several years.  Has been going on at least 3 years.  No smoking history.  She saw cardiology 3 years ago per her report for similar symptoms and had evaluation.  She notes it mostly depends on the weather.  She does wheeze some.  Possible history of asthma.  No orthopnea or PND.   Social History   Tobacco Use  Smoking Status Never Smoker  Smokeless Tobacco Never Used     ROS see history of present illness  Objective  Physical Exam Vitals:   04/20/17 0930  BP: (!) 170/60  Pulse: 80  Temp: (!) 97.5 F (36.4 C)  SpO2: 97%  Ambulatory O2 sat 98%  BP Readings from Last 3 Encounters:  04/20/17 (!) 170/60  04/09/17 (!) 162/92  12/01/16 (!) 180/97   Wt Readings from Last 3 Encounters:  04/20/17 225 lb (102.1 kg)  04/09/17 218 lb 4 oz (99 kg)  12/01/16 233 lb 3.2 oz (105.8 kg)    Physical Exam  Constitutional: No distress.  Cardiovascular:  Normal rate, regular rhythm and normal heart sounds.  Pulmonary/Chest: Effort normal and breath sounds normal.  Musculoskeletal: She exhibits no edema.       Arms: Neurological: She is alert. Gait normal.  Skin: Skin is warm and dry. She is not diaphoretic.   EKG: Normal sinus rhythm, rate 83, no ischemic changes noted  Assessment/Plan: Please see individual problem list.  Dyspnea on exertion Chronic issue.  Unchanged though has been persistent.  Vital signs are stable.  She is in no acute distress.  She reports prior cardiac evaluation though we do not have those records.  We will request those.  EKG done today.  We will check a CBC.  Some of it sounds as though it could be asthmatic related and we will trial her on an albuterol inhaler.  Pending lab work will likely have her see cardiology and pulmonology for further evaluation.  She is given return precautions.  Bronchitis Symptoms have been improving.  Suspect muscular strain leading to her thoracic back discomfort given tenderness and time course.  She will continue to monitor.  If not improving she will be reevaluated.  Hypertension BP borderline low on diastolic limiting what we can give her for her blood pressure.  She will continue her current regimen.  Continue to monitor at home.  Orders Placed This Encounter  Procedures  . Basic Metabolic Panel (  BMET)  . CBC  . EKG 12-Lead    Meds ordered this encounter  Medications  . albuterol (PROVENTIL HFA;VENTOLIN HFA) 108 (90 Base) MCG/ACT inhaler    Sig: Inhale 2 puffs into the lungs every 6 (six) hours as needed for wheezing or shortness of breath.    Dispense:  1 Inhaler    Refill:  0     Audrey Alar, MD [facility]

## 2017-04-20 NOTE — Patient Instructions (Signed)
Nice to see you. We will get some lab work today and call you with the results. We will trial an albuterol inhaler to see if that helps with your breathing. If you develop worsening breathing issues, cough productive of blood, chest pain, or any new or changing symptoms please seek medical attention immediately.

## 2017-04-20 NOTE — Assessment & Plan Note (Signed)
BP borderline low on diastolic limiting what we can give her for her blood pressure.  She will continue her current regimen.  Continue to monitor at home.

## 2017-04-20 NOTE — Assessment & Plan Note (Signed)
Chronic issue.  Unchanged though has been persistent.  Vital signs are stable.  She is in no acute distress.  She reports prior cardiac evaluation though we do not have those records.  We will request those.  EKG done today.  We will check a CBC.  Some of it sounds as though it could be asthmatic related and we will trial her on an albuterol inhaler.  Pending lab work will likely have her see cardiology and pulmonology for further evaluation.  She is given return precautions.

## 2017-04-24 DIAGNOSIS — N1330 Unspecified hydronephrosis: Secondary | ICD-10-CM | POA: Diagnosis not present

## 2017-04-24 DIAGNOSIS — E559 Vitamin D deficiency, unspecified: Secondary | ICD-10-CM | POA: Diagnosis not present

## 2017-04-24 DIAGNOSIS — N183 Chronic kidney disease, stage 3 (moderate): Secondary | ICD-10-CM | POA: Diagnosis not present

## 2017-04-24 DIAGNOSIS — I10 Essential (primary) hypertension: Secondary | ICD-10-CM | POA: Diagnosis not present

## 2017-04-28 ENCOUNTER — Telehealth: Payer: Self-pay

## 2017-04-28 NOTE — Telephone Encounter (Signed)
patient states she was out for 5 days and with [employer] you have to take a leave of absent or you are terminated. Patient had bronchitis and a URI 03/07-03/11. Form placed in red folder

## 2017-05-03 NOTE — Telephone Encounter (Signed)
Form completed with the exception of the area for her job function. Please see what her job consists of and then I can complete and we can fax by 05/05/17. Thanks.

## 2017-05-04 NOTE — Telephone Encounter (Signed)
Completed.  Placed on your desk.  Please fax.

## 2017-05-04 NOTE — Telephone Encounter (Signed)
Patient states she works in the [employer] department around [employer] and [employer]

## 2017-05-05 NOTE — Telephone Encounter (Signed)
faxed

## 2017-05-07 ENCOUNTER — Encounter: Payer: Self-pay | Admitting: Family Medicine

## 2017-05-07 ENCOUNTER — Ambulatory Visit (INDEPENDENT_AMBULATORY_CARE_PROVIDER_SITE_OTHER): Payer: BLUE CROSS/BLUE SHIELD | Admitting: Family Medicine

## 2017-05-07 VITALS — BP 164/90 | HR 57 | Temp 97.7°F | Resp 20 | Wt 217.2 lb

## 2017-05-07 DIAGNOSIS — A084 Viral intestinal infection, unspecified: Secondary | ICD-10-CM | POA: Diagnosis not present

## 2017-05-07 DIAGNOSIS — I10 Essential (primary) hypertension: Secondary | ICD-10-CM | POA: Diagnosis not present

## 2017-05-07 DIAGNOSIS — R5383 Other fatigue: Secondary | ICD-10-CM | POA: Diagnosis not present

## 2017-05-07 NOTE — Progress Notes (Signed)
Subjective:    Patient ID: Audrey Wolf, female    DOB: 1950/06/06, 67 y.o.   MRN: [id number]  HPI  Audrey Wolf is a 67 year old female who presents today for a feeling of fatigue that has been present since recent episode of nausea and vomiting that sent her to urgent care 3 days ago. She was evaluated 3 days ago at urgent care for vomiting and diarrhea that lasted for 2 days. She reports eating only sprite, crackers, and popsicles since that time as she is "scared to vomit." Treatment with ondansetron has provided excellent benefit.  Denies aggravating factors as symptoms are improving but she remains "tired" Today, she denies fever, chills, sweats, N/V/D, sore throat, cough, myalgias, blood in stool, decreased urine output, abdominal pain, rash, or joint pain. She reports feeling tired today but symptoms of N/V/D have improved. She has not progressed diet.. She does not drink water but is drinking sprite and states that she is urinating well and it is pale yellow and clear.  BP elevated at this visit today. She has not been taking any medications since this occurred as she is "scared to vomit" She is aware that she "should" take her medications but is "afraid" of vomiting. She denies chest pain, palpitations, SOB, numbness, tingling, weakness, headaches, or edema.  She is interested in a note for her work as she will need to return at her next upcoming shift in two days.   Review of Systems  Constitutional: Positive for fatigue. Negative for chills and fever.  Respiratory: Negative for cough, shortness of breath and wheezing.   Cardiovascular: Negative for chest pain and palpitations.  Gastrointestinal: Negative for abdominal pain, blood in stool, diarrhea, nausea and vomiting.  Genitourinary: Negative for dysuria and urgency.  Musculoskeletal: Negative for myalgias.  Skin: Negative for rash.  Neurological: Negative for dizziness, weakness, light-headedness and headaches.    Psychiatric/Behavioral:       Denies depressed or anxious mood today.   Past Medical History:  Diagnosis Date  . Arthritis   . Asthma   . Chronic kidney disease   . Hypertension   . Nephrolithiasis      Social History   Socioeconomic History  . Marital status: Single    Spouse name: Not on file  . Number of children: Not on file  . Years of education: Not on file  . Highest education level: Not on file  Occupational History    Employer: [employer]  Social Needs  . Financial resource strain: Not on file  . Food insecurity:    Worry: Not on file    Inability: Not on file  . Transportation needs:    Medical: Not on file    Non-medical: Not on file  Tobacco Use  . Smoking status: Never Smoker  . Smokeless tobacco: Never Used  Substance and Sexual Activity  . Alcohol use: No  . Drug use: No  . Sexual activity: Never    Partners: Male  Lifestyle  . Physical activity:    Days per week: Not on file    Minutes per session: Not on file  . Stress: Not on file  Relationships  . Social connections:    Talks on phone: Not on file    Gets together: Not on file    Attends religious service: Not on file    Active member of club or organization: Not on file    Attends meetings of clubs or organizations: Not on file  Relationship status: Not on file  . Intimate partner violence:    Fear of current or ex partner: Not on file    Emotionally abused: Not on file    Physically abused: Not on file    Forced sexual activity: Not on file  Other Topics Concern  . Not on file  Social History Narrative  . Not on file    Past Surgical History:  Procedure Laterality Date  . ABDOMINAL HYSTERECTOMY    . APPENDECTOMY    . BRAIN SURGERY    . BREAST BIOPSY Left 12/31/2010   neg  . LITHOTRIPSY    . TONSILLECTOMY      Family History  Problem Relation Age of Onset  . Arthritis Mother   . Arthritis Father   . Heart disease Father   . Stroke Father   . Sudden Cardiac Death  Father   . Breast cancer Maternal Aunt     Allergies  Allergen Reactions  . Ace Inhibitors Other (See Comments)    Has been told to avoid these and any diuretics because "left kidney has shrunk up and doesn't work"    Current Outpatient Medications on File Prior to Visit  Medication Sig Dispense Refill  . albuterol (PROVENTIL HFA;VENTOLIN HFA) 108 (90 Base) MCG/ACT inhaler Inhale 2 puffs into the lungs every 6 (six) hours as needed for wheezing or shortness of breath. (Patient not taking: Reported on 05/07/2017) 1 Inhaler 0  . amLODipine (NORVASC) 10 MG tablet Take 10 mg by mouth daily.     . ASPIRIN 81 PO Take 81 mg by mouth.    . carvedilol (COREG) 3.125 MG tablet Take 1 tablet (3.125 mg total) by mouth 2 (two) times daily with a meal. (Patient not taking: Reported on 05/07/2017) 60 tablet 3  . cloNIDine (CATAPRES - DOSED IN MG/24 HR) 0.3 mg/24hr patch Place 1 patch (0.3 mg total) onto the skin once a week. (Patient not taking: Reported on 05/07/2017) 12 patch 3  . hydrALAZINE (APRESOLINE) 100 MG tablet Take 1 tablet (100 mg total) by mouth 3 (three) times daily. (Patient not taking: Reported on 05/07/2017) 270 tablet 1  . loratadine (CLARITIN) 10 MG tablet Take 10 mg by mouth daily.     Marland Kitchen losartan (COZAAR) 25 MG tablet Take 1 tablet (25 mg total) by mouth daily. (Patient not taking: Reported on 05/07/2017) 30 tablet 1  . RaNITidine HCl (ZANTAC PO) Take 150 mg by mouth daily.     . rosuvastatin (CRESTOR) 20 MG tablet Take 1 tablet (20 mg total) by mouth daily. (Patient not taking: Reported on 05/07/2017) 90 tablet 3   No current facility-administered medications on file prior to visit.     BP (!) 164/90   Pulse (!) 57   Temp 97.7 F (36.5 C) (Oral)   Resp 20   Wt 217 lb 4 oz (98.5 kg)   SpO2 98%   BMI 32.55 kg/m        Objective:   Physical Exam  Constitutional: She is oriented to person, place, and time. She appears well-developed and well-nourished.  HENT:  Mouth/Throat: Oropharynx  is clear and moist and mucous membranes are normal.  Eyes: Pupils are equal, round, and reactive to light. No scleral icterus.  Neck: Neck supple.  Cardiovascular: Normal rate, regular rhythm and intact distal pulses.  Pulmonary/Chest: Effort normal and breath sounds normal. She has no wheezes. She has no rales.  Abdominal: Soft. Bowel sounds are normal. There is no tenderness.  Lymphadenopathy:  She has no cervical adenopathy.  Neurological: She is alert and oriented to person, place, and time. Coordination normal.  Skin: Skin is warm and dry. No rash noted.  Psychiatric: She has a normal mood and affect. Her behavior is normal. Judgment and thought content normal.       Assessment & Plan:  1. Viral gastroenteritis Resolving; no N/V/D present today; ondansetron provided relief; She is hydrating but has not progressed her diet due to fear of possible emesis. No fever noted today; Advised progressing diet slowing and provided suggestions to her.   2. Fatigue, unspecified type History of DOE which is chronic in nature. She denies SOB today. No change noted with DOE. No acute distress. She was advised by PCP to initiate a trail of albuterol inhaler and discussed referral to cardiology and pulmonology. Recheck of HR today is 78. Symptom of fatigue is likely associated with recent viral gastroenteritis which is now resolving. Symptom is most likely associated with resolving N/V/D and patient's lack of eating and low water intake. Advised slow progression of diet and increase in water intake today. Lab work recently completed indicated kidney function as relatively stable and no cause for DOE was found on lab work which is reassuring. We agreed to hold off on rechecking lab work until she has tried progressing diet and increasing water intake.  Advised her to make diet changes, increase water intake, and follow up with PCP if symptoms do not improve, worsen, or she develops new symptoms particularly  fever and SOB.   3. Hypertension, unspecified type Retake of BP today 164/90. She has not taken her medications over the course of a "few days" with symptoms of viral gastroenteritis. We discussed that her BP was elevated and she agreed to take BP medications as directed by PCP today. Further advised her to monitor her BP, document readings, and follow up for further evaluation if BP >140/90.   Work note was provided for patient today to return to work in 2 days. If symptoms have not improved, she will let us know.  Return precautions advised.  Roddie Mc, FNP-C

## 2017-05-07 NOTE — Patient Instructions (Signed)
Please increase fluid intake; preferably water.  Also, please increase diet intake and move to bland foods first as discussed.  If symptoms do not improve with treatment, worsen, or you develop new symptoms, please follow up for further evaluation and treatment.   Viral Gastroenteritis, Adult Viral gastroenteritis is also known as the stomach flu. This condition is caused by certain germs (viruses). These germs can be passed from person to person very easily (are very contagious). This condition can cause sudden watery poop (diarrhea), fever, and throwing up (vomiting). Having watery poop and throwing up can make you feel weak and cause you to get dehydrated. Dehydration can make you tired and thirsty, make you have a dry mouth, and make it so you pee (urinate) less often. Older adults and people with other diseases or a weak defense system (immune system) are at higher risk for dehydration. It is important to replace the fluids that you lose from having watery poop and throwing up. Follow these instructions at home: Follow instructions from your doctor about how to care for yourself at home. Eating and drinking  Follow these instructions as told by your doctor:  Take an oral rehydration solution (ORS). This is a drink that is sold at pharmacies and stores.  Drink clear fluids in small amounts as you are able, such as: ? Water. ? Ice chips. ? Diluted fruit juice. ? Low-calorie sports drinks.  Eat bland, easy-to-digest foods in small amounts as you are able, such as: ? Bananas. ? Applesauce. ? Rice. ? Low-fat (lean) meats. ? Toast. ? Crackers.  Avoid fluids that have a lot of sugar or caffeine in them.  Avoid alcohol.  Avoid spicy or fatty foods.  General instructions  Drink enough fluid to keep your pee (urine) clear or pale yellow.  Wash your hands often. If you cannot use soap and water, use hand sanitizer.  Make sure that all people in your home wash their hands well  and often.  Rest at home while you get better.  Take over-the-counter and prescription medicines only as told by your doctor.  Watch your condition for any changes.  Take a warm bath to help with any burning or pain from having watery poop.  Keep all follow-up visits as told by your doctor. This is important. Contact a doctor if:  You cannot keep fluids down.  Your symptoms get worse.  You have new symptoms.  You feel light-headed or dizzy.  You have muscle cramps. Get help right away if:  You have chest pain.  You feel very weak or you pass out (faint).  You see blood in your throw-up.  Your throw-up looks like coffee grounds.  You have bloody or black poop (stools) or poop that look like tar.  You have a very bad headache, a stiff neck, or both.  You have a rash.  You have very bad pain, cramping, or bloating in your belly (abdomen).  You have trouble breathing.  You are breathing very quickly.  Your heart is beating very quickly.  Your skin feels cold and clammy.  You feel confused.  You have pain when you pee.  You have signs of dehydration, such as: ? Dark pee, hardly any pee, or no pee. ? Cracked lips. ? Dry mouth. ? Sunken eyes. ? Sleepiness. ? Weakness. This information is not intended to replace advice given to you by your health care provider. Make sure you discuss any questions you have with your health care provider. Document Released:  07/09/2007 Document Revised: 08/10/2015 Document Reviewed: 09/26/2014 Elsevier Interactive Patient Education  2017 [employer]

## 2017-05-16 ENCOUNTER — Emergency Department: Payer: BLUE CROSS/BLUE SHIELD

## 2017-05-16 ENCOUNTER — Inpatient Hospital Stay
Admission: EM | Admit: 2017-05-16 | Discharge: 2017-05-19 | DRG: 280 | Disposition: A | Discharge status: Other Acute Inpatient Hospital | Payer: BLUE CROSS/BLUE SHIELD | Attending: Internal Medicine | Admitting: Internal Medicine

## 2017-05-16 ENCOUNTER — Other Ambulatory Visit: Payer: Self-pay

## 2017-05-16 ENCOUNTER — Inpatient Hospital Stay
Admit: 2017-05-16 | Discharge: 2017-05-16 | Disposition: A | Discharge status: Home / Self Care | Payer: BLUE CROSS/BLUE SHIELD | Attending: Internal Medicine | Admitting: Internal Medicine

## 2017-05-16 DIAGNOSIS — N261 Atrophy of kidney (terminal): Secondary | ICD-10-CM | POA: Diagnosis present

## 2017-05-16 DIAGNOSIS — R0602 Shortness of breath: Secondary | ICD-10-CM | POA: Diagnosis not present

## 2017-05-16 DIAGNOSIS — I130 Hypertensive heart and chronic kidney disease with heart failure and stage 1 through stage 4 chronic kidney disease, or unspecified chronic kidney disease: Secondary | ICD-10-CM | POA: Diagnosis present

## 2017-05-16 DIAGNOSIS — R748 Abnormal levels of other serum enzymes: Secondary | ICD-10-CM | POA: Diagnosis not present

## 2017-05-16 DIAGNOSIS — I214 Non-ST elevation (NSTEMI) myocardial infarction: Secondary | ICD-10-CM | POA: Diagnosis not present

## 2017-05-16 DIAGNOSIS — R778 Other specified abnormalities of plasma proteins: Secondary | ICD-10-CM

## 2017-05-16 DIAGNOSIS — Z8249 Family history of ischemic heart disease and other diseases of the circulatory system: Secondary | ICD-10-CM

## 2017-05-16 DIAGNOSIS — Z823 Family history of stroke: Secondary | ICD-10-CM

## 2017-05-16 DIAGNOSIS — Z8261 Family history of arthritis: Secondary | ICD-10-CM

## 2017-05-16 DIAGNOSIS — R000 Tachycardia, unspecified: Secondary | ICD-10-CM | POA: Diagnosis not present

## 2017-05-16 DIAGNOSIS — J9601 Acute respiratory failure with hypoxia: Secondary | ICD-10-CM | POA: Diagnosis not present

## 2017-05-16 DIAGNOSIS — Z7982 Long term (current) use of aspirin: Secondary | ICD-10-CM | POA: Diagnosis not present

## 2017-05-16 DIAGNOSIS — I5033 Acute on chronic diastolic (congestive) heart failure: Secondary | ICD-10-CM | POA: Diagnosis present

## 2017-05-16 DIAGNOSIS — E876 Hypokalemia: Secondary | ICD-10-CM | POA: Diagnosis present

## 2017-05-16 DIAGNOSIS — R197 Diarrhea, unspecified: Secondary | ICD-10-CM | POA: Diagnosis not present

## 2017-05-16 DIAGNOSIS — Z803 Family history of malignant neoplasm of breast: Secondary | ICD-10-CM | POA: Diagnosis not present

## 2017-05-16 DIAGNOSIS — J441 Chronic obstructive pulmonary disease with (acute) exacerbation: Secondary | ICD-10-CM | POA: Diagnosis present

## 2017-05-16 DIAGNOSIS — N182 Chronic kidney disease, stage 2 (mild): Secondary | ICD-10-CM | POA: Diagnosis present

## 2017-05-16 DIAGNOSIS — E785 Hyperlipidemia, unspecified: Secondary | ICD-10-CM | POA: Diagnosis present

## 2017-05-16 DIAGNOSIS — I509 Heart failure, unspecified: Secondary | ICD-10-CM

## 2017-05-16 DIAGNOSIS — J90 Pleural effusion, not elsewhere classified: Secondary | ICD-10-CM

## 2017-05-16 DIAGNOSIS — J918 Pleural effusion in other conditions classified elsewhere: Secondary | ICD-10-CM | POA: Diagnosis not present

## 2017-05-16 DIAGNOSIS — I10 Essential (primary) hypertension: Secondary | ICD-10-CM | POA: Diagnosis not present

## 2017-05-16 DIAGNOSIS — R7989 Other specified abnormal findings of blood chemistry: Secondary | ICD-10-CM

## 2017-05-16 DIAGNOSIS — R0600 Dyspnea, unspecified: Secondary | ICD-10-CM

## 2017-05-16 LAB — CBC WITH DIFFERENTIAL/PLATELET
Basophils Absolute: 0.1 10*3/uL (ref 0–0.1)
Basophils Relative: 1 %
Eosinophils Absolute: 0 10*3/uL (ref 0–0.7)
Eosinophils Relative: 0 %
HCT: 38 % (ref 35.0–47.0)
HEMOGLOBIN: 13.2 g/dL (ref 12.0–16.0)
LYMPHS ABS: 1.3 10*3/uL (ref 1.0–3.6)
LYMPHS PCT: 10 %
MCH: 31 pg (ref 26.0–34.0)
MCHC: 34.8 g/dL (ref 32.0–36.0)
MCV: 89.1 fL (ref 80.0–100.0)
Monocytes Absolute: 0.9 10*3/uL (ref 0.2–0.9)
Monocytes Relative: 7 %
NEUTROS ABS: 11 10*3/uL — AB (ref 1.4–6.5)
NEUTROS PCT: 82 %
Platelets: 211 10*3/uL (ref 150–440)
RBC: 4.26 MIL/uL (ref 3.80–5.20)
RDW: 14 % (ref 11.5–14.5)
WBC: 13.3 10*3/uL — AB (ref 3.6–11.0)

## 2017-05-16 LAB — APTT: aPTT: 31 seconds (ref 24–36)

## 2017-05-16 LAB — BRAIN NATRIURETIC PEPTIDE: B Natriuretic Peptide: 461 pg/mL — ABNORMAL HIGH (ref 0.0–100.0)

## 2017-05-16 LAB — BASIC METABOLIC PANEL
ANION GAP: 7 (ref 5–15)
BUN: 18 mg/dL (ref 6–20)
CHLORIDE: 114 mmol/L — AB (ref 101–111)
CO2: 20 mmol/L — AB (ref 22–32)
Calcium: 8.5 mg/dL — ABNORMAL LOW (ref 8.9–10.3)
Creatinine, Ser: 1.11 mg/dL — ABNORMAL HIGH (ref 0.44–1.00)
GFR calc Af Amer: 58 mL/min — ABNORMAL LOW (ref >60)
GFR calc non Af Amer: 50 mL/min — ABNORMAL LOW (ref >60)
Glucose, Bld: 140 mg/dL — ABNORMAL HIGH (ref 65–99)
Potassium: 3.2 mmol/L — ABNORMAL LOW (ref 3.5–5.1)
Sodium: 141 mmol/L (ref 135–145)

## 2017-05-16 LAB — STREP PNEUMONIAE URINARY ANTIGEN: STREP PNEUMO URINARY ANTIGEN: NEGATIVE

## 2017-05-16 LAB — TROPONIN I
TROPONIN I: 0.46 ng/mL — AB (ref <0.03)
Troponin I: 0.41 ng/mL (ref <0.03)
Troponin I: 0.54 ng/mL (ref <0.03)
Troponin I: 0.58 ng/mL (ref <0.03)

## 2017-05-16 LAB — PROTIME-INR
INR: 1
Prothrombin Time: 13.1 seconds (ref 11.4–15.2)

## 2017-05-16 LAB — LACTIC ACID, PLASMA
LACTIC ACID, VENOUS: 2.2 mmol/L — AB (ref 0.5–1.9)
Lactic Acid, Venous: 2.5 mmol/L (ref 0.5–1.9)

## 2017-05-16 LAB — ECHOCARDIOGRAM COMPLETE
Height: 69 in
WEIGHTICAEL: 3622.4 [oz_av]

## 2017-05-16 LAB — INFLUENZA PANEL BY PCR (TYPE A & B)
INFLAPCR: NEGATIVE
Influenza B By PCR: NEGATIVE

## 2017-05-16 LAB — HEPARIN LEVEL (UNFRACTIONATED)
Heparin Unfractionated: 0.47 IU/mL (ref 0.30–0.70)
Heparin Unfractionated: 0.4 IU/mL (ref 0.30–0.70)

## 2017-05-16 LAB — MAGNESIUM: MAGNESIUM: 1.7 mg/dL (ref 1.7–2.4)

## 2017-05-16 MED ORDER — CEFTRIAXONE SODIUM 1 G IJ SOLR
1.0000 g | INTRAMUSCULAR | Status: DC
  Administered 2017-05-17 – 2017-05-19 (×3): 1 g via INTRAVENOUS
  Filled 2017-05-16 (×3): qty 10

## 2017-05-16 MED ORDER — IPRATROPIUM-ALBUTEROL 0.5-2.5 (3) MG/3ML IN SOLN
3.0000 mL | Freq: Four times a day (QID) | RESPIRATORY_TRACT | Status: DC
  Administered 2017-05-16 – 2017-05-19 (×9): 3 mL via RESPIRATORY_TRACT
  Filled 2017-05-16 (×9): qty 3

## 2017-05-16 MED ORDER — HEPARIN BOLUS VIA INFUSION
4000.0000 [IU] | Freq: Once | INTRAVENOUS | Status: AC
  Administered 2017-05-16: 4000 [IU] via INTRAVENOUS
  Filled 2017-05-16: qty 4000

## 2017-05-16 MED ORDER — ONDANSETRON HCL 4 MG/2ML IJ SOLN
4.0000 mg | Freq: Once | INTRAMUSCULAR | Status: AC
  Administered 2017-05-16: 4 mg via INTRAVENOUS
  Filled 2017-05-16: qty 2

## 2017-05-16 MED ORDER — LIDOCAINE HCL (PF) 4 % IJ SOLN
5.0000 mL | Freq: Once | INTRAMUSCULAR | Status: AC
  Administered 2017-05-16: 5 mL via RESPIRATORY_TRACT
  Filled 2017-05-16: qty 5

## 2017-05-16 MED ORDER — FUROSEMIDE 10 MG/ML IJ SOLN
60.0000 mg | Freq: Once | INTRAMUSCULAR | Status: AC
  Administered 2017-05-16: 60 mg via INTRAVENOUS
  Filled 2017-05-16: qty 8

## 2017-05-16 MED ORDER — IPRATROPIUM-ALBUTEROL 0.5-2.5 (3) MG/3ML IN SOLN
3.0000 mL | Freq: Once | RESPIRATORY_TRACT | Status: AC
  Administered 2017-05-16: 3 mL via RESPIRATORY_TRACT
  Filled 2017-05-16: qty 3

## 2017-05-16 MED ORDER — IOHEXOL 350 MG/ML SOLN
75.0000 mL | Freq: Once | INTRAVENOUS | Status: AC | PRN
  Administered 2017-05-16: 75 mL via INTRAVENOUS

## 2017-05-16 MED ORDER — NITROGLYCERIN 0.4 MG SL SUBL: 0.4000 mg | SUBLINGUAL_TABLET | SUBLINGUAL | Status: DC | PRN

## 2017-05-16 MED ORDER — ASPIRIN 81 MG PO CHEW: 81.0000 mg | CHEWABLE_TABLET | Freq: Every day | ORAL | Status: DC

## 2017-05-16 MED ORDER — HEPARIN (PORCINE) IN NACL 100-0.45 UNIT/ML-% IJ SOLN
1250.0000 [IU]/h | INTRAMUSCULAR | Status: DC
  Administered 2017-05-16 – 2017-05-17 (×3): 1100 [IU]/h via INTRAVENOUS
  Filled 2017-05-16 (×3): qty 250

## 2017-05-16 MED ORDER — SODIUM CHLORIDE 0.9 % IV SOLN
500.0000 mg | INTRAVENOUS | Status: DC
  Administered 2017-05-16 – 2017-05-17 (×2): 500 mg via INTRAVENOUS
  Filled 2017-05-16 (×2): qty 500

## 2017-05-16 MED ORDER — LOSARTAN POTASSIUM 25 MG PO TABS
25.0000 mg | ORAL_TABLET | Freq: Every day | ORAL | Status: DC
  Administered 2017-05-16 – 2017-05-18 (×3): 25 mg via ORAL
  Filled 2017-05-16 (×3): qty 1

## 2017-05-16 MED ORDER — MORPHINE SULFATE (PF) 4 MG/ML IV SOLN
4.0000 mg | INTRAVENOUS | Status: DC | PRN
  Administered 2017-05-16: 4 mg via INTRAVENOUS
  Filled 2017-05-16: qty 1

## 2017-05-16 MED ORDER — ONDANSETRON HCL 4 MG/2ML IJ SOLN: 4.0000 mg | Freq: Four times a day (QID) | INTRAMUSCULAR | Status: DC | PRN

## 2017-05-16 MED ORDER — ROSUVASTATIN CALCIUM 10 MG PO TABS
20.0000 mg | ORAL_TABLET | Freq: Every day | ORAL | Status: DC
  Administered 2017-05-16 – 2017-05-18 (×3): 20 mg via ORAL
  Filled 2017-05-16 (×3): qty 2

## 2017-05-16 MED ORDER — ACETAMINOPHEN 325 MG PO TABS: 650.0000 mg | ORAL_TABLET | ORAL | Status: DC | PRN

## 2017-05-16 MED ORDER — CARVEDILOL 3.125 MG PO TABS
3.1250 mg | ORAL_TABLET | Freq: Two times a day (BID) | ORAL | Status: DC
  Administered 2017-05-16 – 2017-05-18 (×5): 3.125 mg via ORAL
  Filled 2017-05-16 (×6): qty 1

## 2017-05-16 MED ORDER — AMLODIPINE BESYLATE 10 MG PO TABS
10.0000 mg | ORAL_TABLET | Freq: Every day | ORAL | Status: DC
  Administered 2017-05-16 – 2017-05-18 (×3): 10 mg via ORAL
  Filled 2017-05-16 (×3): qty 1

## 2017-05-16 MED ORDER — ASPIRIN 81 MG PO CHEW
324.0000 mg | CHEWABLE_TABLET | Freq: Once | ORAL | Status: AC
  Administered 2017-05-16: 324 mg via ORAL
  Filled 2017-05-16: qty 4

## 2017-05-16 MED ORDER — LORAZEPAM 0.5 MG PO TABS
0.5000 mg | ORAL_TABLET | Freq: Once | ORAL | Status: AC
  Administered 2017-05-16: 0.5 mg via ORAL
  Filled 2017-05-16: qty 1

## 2017-05-16 MED ORDER — ACETAMINOPHEN 500 MG PO TABS
500.0000 mg | ORAL_TABLET | Freq: Every day | ORAL | Status: DC
  Administered 2017-05-16 – 2017-05-18 (×3): 500 mg via ORAL
  Filled 2017-05-16 (×3): qty 1

## 2017-05-16 MED ORDER — ASPIRIN EC 81 MG PO TBEC
81.0000 mg | DELAYED_RELEASE_TABLET | Freq: Every day | ORAL | Status: DC
  Administered 2017-05-17 – 2017-05-18 (×2): 81 mg via ORAL
  Filled 2017-05-16: qty 1

## 2017-05-16 MED ORDER — POTASSIUM CHLORIDE CRYS ER 20 MEQ PO TBCR
40.0000 meq | EXTENDED_RELEASE_TABLET | Freq: Once | ORAL | Status: AC
  Administered 2017-05-16: 40 meq via ORAL
  Filled 2017-05-16: qty 2

## 2017-05-16 MED ORDER — ALBUTEROL SULFATE (2.5 MG/3ML) 0.083% IN NEBU
2.5000 mg | INHALATION_SOLUTION | Freq: Four times a day (QID) | RESPIRATORY_TRACT | Status: DC | PRN
Start: 2017-05-16 — End: 2017-05-19

## 2017-05-16 MED ORDER — SODIUM CHLORIDE 0.9 % IV SOLN
2.0000 g | INTRAVENOUS | Status: DC
  Administered 2017-05-16: 2 g via INTRAVENOUS
  Filled 2017-05-16: qty 20

## 2017-05-16 MED ORDER — BUDESONIDE 0.5 MG/2ML IN SUSP
0.5000 mg | Freq: Two times a day (BID) | RESPIRATORY_TRACT | Status: DC
  Administered 2017-05-16 – 2017-05-19 (×6): 0.5 mg via RESPIRATORY_TRACT
  Filled 2017-05-16 (×8): qty 2

## 2017-05-16 MED ORDER — LORATADINE 10 MG PO TABS
10.0000 mg | ORAL_TABLET | Freq: Every day | ORAL | Status: DC
  Administered 2017-05-16 – 2017-05-18 (×3): 10 mg via ORAL
  Filled 2017-05-16 (×3): qty 1

## 2017-05-16 MED ORDER — CLONIDINE HCL 0.3 MG/24HR TD PTWK
0.3000 mg | MEDICATED_PATCH | TRANSDERMAL | Status: DC
  Administered 2017-05-16: 0.3 mg via TRANSDERMAL
  Filled 2017-05-16: qty 1

## 2017-05-16 MED ORDER — FAMOTIDINE 20 MG PO TABS
10.0000 mg | ORAL_TABLET | Freq: Every day | ORAL | Status: DC
  Administered 2017-05-16 – 2017-05-18 (×3): 10 mg via ORAL
  Filled 2017-05-16 (×3): qty 1

## 2017-05-16 MED ORDER — PREDNISONE 20 MG PO TABS
40.0000 mg | ORAL_TABLET | Freq: Every day | ORAL | Status: DC
  Administered 2017-05-16 – 2017-05-17 (×2): 40 mg via ORAL
  Filled 2017-05-16 (×2): qty 2

## 2017-05-16 MED ORDER — FUROSEMIDE 10 MG/ML IJ SOLN
40.0000 mg | Freq: Two times a day (BID) | INTRAMUSCULAR | Status: DC
  Administered 2017-05-16: 40 mg via INTRAVENOUS
  Filled 2017-05-16 (×2): qty 4

## 2017-05-16 MED ORDER — HYDRALAZINE HCL 50 MG PO TABS
100.0000 mg | ORAL_TABLET | Freq: Three times a day (TID) | ORAL | Status: DC
  Administered 2017-05-16 – 2017-05-18 (×7): 100 mg via ORAL
  Filled 2017-05-16 (×7): qty 2

## 2017-05-16 MED ORDER — PREDNISONE 20 MG PO TABS
60.0000 mg | ORAL_TABLET | Freq: Once | ORAL | Status: AC
  Administered 2017-05-16: 60 mg via ORAL
  Filled 2017-05-16: qty 3

## 2017-05-16 NOTE — ED Notes (Signed)
Patient transported to X-ray 

## 2017-05-16 NOTE — Progress Notes (Signed)
   05/16/17 1500  Clinical Encounter Type  Visited With Patient and family together  Visit Type Initial  Referral From Nurse  Consult/Referral To Chaplain  Spiritual Encounters  Spiritual Needs Brochure;Other (Comment)   CH received an OR to educate the PT on an AD. CH left paperwork with PT to be completed if she desired.

## 2017-05-16 NOTE — H&P (Signed)
[facility] - [place] [facility]   PATIENT NAME: Audrey Wolf    MR#:  [id number]  DATE OF BIRTH:  1950-06-15  DATE OF ADMISSION:  05/16/2017  PRIMARY CARE PHYSICIAN: Glori Luis, MD   REQUESTING/REFERRING PHYSICIAN:  Willy Eddy, MD   CHIEF COMPLAINT:   Chief Complaint  Patient presents with  . Shortness of Breath    HISTORY OF PRESENT ILLNESS:  Audrey Wolf  is a 67 y.o. female with a known history of HTN, HLD, COPD/asthma, nephrolithiasis, ureteral stricture, atrophic L kidney, CKD who p/w SOB, fatigue/malaise/generalized weakness. Pt states that she developed bronchitis/URI in 04/2017, which she states was managed as an outpatient. She states she improved clinically, and went back to work. She states that ~10 days ago, she developed a viral GI illness (which she believes she contracted from her grandchild and daughter-in-law), for which she saw her PCP (Dr. Birdie Sons). She states she felt better after several days, and went back to work. She noticed, however, that she was getting tired more easily, and endorses progressively worsening fatigue/malaise and reduced exercise tolerance for the past ~7d. She also notes a persistent dry cough, (-) sputum production. She endorses mild chronic SOB at baseline, which she attributes to mild COPD/asthma, but she states the SOB became markedly worse overnight, prompting the pt to drive herself to the hospital on Friday 05/15/2017. CT chest performed in the ED demonstrates pleural effusions. Trop-I elevated to 0.41. Pt denies F/C/N/V/D/AP, CP, palpitations, diaphoresis, orthopnea, PND, night sweats, LH/LOC, urinary symptoms. Pt states that she saw a Cardiologist, Dr. Gwen Pounds, ~64yrs ago for what she describes as a regular checkup, but she believes she had a Stress Test and Echo (both normal) at that time.  PAST MEDICAL HISTORY:   Past Medical History:  Diagnosis Date  . Arthritis   . Asthma   . Chronic  kidney disease   . Hypertension   . Nephrolithiasis     PAST SURGICAL HISTORY:   Past Surgical History:  Procedure Laterality Date  . ABDOMINAL HYSTERECTOMY    . APPENDECTOMY    . BREAST BIOPSY Left 12/31/2010   neg  . LITHOTRIPSY    . TONSILLECTOMY      SOCIAL HISTORY:   Social History   Tobacco Use  . Smoking status: Never Smoker  . Smokeless tobacco: Never Used  Substance Use Topics  . Alcohol use: No    FAMILY HISTORY:   Family History  Problem Relation Age of Onset  . Arthritis Mother   . Arthritis Father   . Heart disease Father   . Stroke Father   . Sudden Cardiac Death Father   . Breast cancer Maternal Aunt     DRUG ALLERGIES:   Allergies  Allergen Reactions  . Ace Inhibitors Other (See Comments)    Has been told to avoid these and any diuretics because "left kidney has shrunk up and doesn't work"    REVIEW OF SYSTEMS:   Review of Systems  Constitutional: Positive for malaise/fatigue. Negative for chills, diaphoresis, fever and weight loss.  HENT: Negative for congestion, hearing loss, sore throat and tinnitus.   Eyes: Negative for blurred vision, double vision, photophobia and redness.  Respiratory: Positive for cough (+) dry non-productive cough and shortness of breath. Negative for hemoptysis, sputum production, wheezing and stridor.   Cardiovascular: Negative for chest pain, palpitations, orthopnea, claudication, leg swelling and PND.  Gastrointestinal: Negative for abdominal pain, blood in stool, constipation, diarrhea, heartburn, melena, nausea and vomiting.  Genitourinary: Negative for dysuria, frequency, hematuria and urgency.  Musculoskeletal: Negative for back pain, joint pain and neck pain.  Skin: Negative for itching and rash.  Neurological: Positive for weakness (+) generalized weakness. Negative for dizziness, tingling, tremors, focal weakness, seizures, loss of consciousness and headaches.    MEDICATIONS AT HOME:   Prior to  Admission medications   Medication Sig Start Date End Date Taking? Authorizing Provider  acetaminophen (TYLENOL) 500 MG tablet Take 500 mg by mouth daily.   Yes [provider]  albuterol (PROVENTIL HFA;VENTOLIN HFA) 108 (90 Base) MCG/ACT inhaler Inhale 2 puffs into the lungs every 6 (six) hours as needed for wheezing or shortness of breath. 04/20/17  Yes Glori Luis, MD  amLODipine (NORVASC) 10 MG tablet Take 10 mg by mouth daily.  03/01/12  Yes [provider]  ASPIRIN 81 PO Take 81 mg by mouth.   Yes [provider]  cloNIDine (CATAPRES - DOSED IN MG/24 HR) 0.3 mg/24hr patch Place 1 patch (0.3 mg total) onto the skin once a week. 09/05/16  Yes Cook, Jayce G, DO  hydrALAZINE (APRESOLINE) 100 MG tablet Take 1 tablet (100 mg total) by mouth 3 (three) times daily. 10/10/16  Yes Cook, Jayce G, DO  loratadine (CLARITIN) 10 MG tablet Take 10 mg by mouth daily.    Yes [provider]  losartan (COZAAR) 25 MG tablet Take 1 tablet (25 mg total) by mouth daily. 12/02/16  Yes Glori Luis, MD  RaNITidine HCl (ZANTAC PO) Take 150 mg by mouth daily.    Yes [provider]  rosuvastatin (CRESTOR) 20 MG tablet Take 1 tablet (20 mg total) by mouth daily. 06/06/16  Yes Cook, Jayce G, DO  carvedilol (COREG) 3.125 MG tablet Take 1 tablet (3.125 mg total) by mouth 2 (two) times daily with a meal. Patient not taking: Reported on 05/07/2017 11/17/16   Dale , MD      VITAL SIGNS:  Blood pressure (!) 137/98, pulse (!) 107, temperature (!) 97.5 F (36.4 C), temperature source Oral, resp. rate (!) 25, weight 98.4 kg (217 lb), SpO2 95 %.  PHYSICAL EXAMINATION:  Physical Exam  Constitutional: She is oriented to person, place, and time. She appears well-developed and well-nourished.  Non-toxic appearance. She does not appear ill. She appears distressed (+) mild respiratory distress.  HENT:  Head: Normocephalic and atraumatic.  Eyes: Pupils are equal, round,  and reactive to light. EOM are normal.  Neck: Neck supple. No hepatojugular reflux present. No thyromegaly present.  Cardiovascular: Normal rate, regular rhythm and normal heart sounds. Exam reveals no gallop and no friction rub.  No murmur heard. Pulmonary/Chest: No stridor. Tachypnea noted. She is in respiratory distress (+) mild respiratory distress. She has decreased breath sounds in the right lower field and the left lower field. She has no wheezes. She has rhonchi ((+) bibasilar fine crackles). She has rales.  Abdominal: Soft. Bowel sounds are normal. She exhibits no distension and no ascites. There is no tenderness. There is no rebound and no guarding.  Musculoskeletal: She exhibits edema (+) trace/1+ B/L LE edema.       Right lower leg: Normal. She exhibits edema (+) trace/1+ B/L LE edema.       Left lower leg: Normal. She exhibits edema (+) trace/1+ B/L LE edema.  Neurological: She is alert and oriented to person, place, and time.  Skin: Skin is warm and dry. No rash noted. She is not diaphoretic. No erythema. No pallor.  Psychiatric: She  has a normal mood and affect. Her behavior is normal. Thought content normal.    GENERAL:  67 y.o.-year-old patient lying in the bed with no acute distress.  EYES: Pupils equal, round, reactive to light and accommodation. No scleral icterus. Extraocular muscles intact.  HEENT: Head atraumatic, normocephalic. Oropharynx and nasopharynx clear.  NECK:  Supple, no jugular venous distention. No thyroid enlargement, no tenderness.  LUNGS: Normal breath sounds bilaterally, no wheezing, rales,rhonchi or crepitation. No use of accessory muscles of respiration.  CARDIOVASCULAR: S1, S2 normal. No murmurs, rubs, or gallops.  ABDOMEN: Soft, nontender, nondistended. Bowel sounds present. No organomegaly or mass.  EXTREMITIES: No pedal edema, cyanosis, or clubbing.  NEUROLOGIC: Cranial nerves II through XII are intact. Muscle strength 5/5 in all extremities.  Sensation intact. Gait not checked.  PSYCHIATRIC: The patient is alert and oriented x 3.  SKIN: No obvious rash, lesion, or ulcer.   LABORATORY PANEL:   CBC Recent Labs  Lab 05/16/17 0319  WBC 13.3*  HGB 13.2  HCT 38.0  PLT 211   ------------------------------------------------------------------------------------------------------------------  Chemistries  Recent Labs  Lab 05/16/17 0308  NA 141  K 3.2*  CL 114*  CO2 20*  GLUCOSE 140*  BUN 18  CREATININE 1.11*  CALCIUM 8.5*   ------------------------------------------------------------------------------------------------------------------  Cardiac Enzymes Recent Labs  Lab 05/16/17 0308  TROPONINI 0.41*   ------------------------------------------------------------------------------------------------------------------  RADIOLOGY:  Dg Chest 2 View  Result Date: 05/16/2017 CLINICAL DATA:  Shortness of breath.  On antibiotics for bronchitis. EXAM: CHEST - 2 VIEW COMPARISON:  Chest radiograph May 22, 2010 FINDINGS: Diffuse interstitial prominence with patchy bibasilar airspace opacities and small pleural effusions. Cardiac silhouette is mildly enlarged. Calcified aortic knob. No pneumothorax. LEFT upper lobe granuloma versus shoulder loose body. No pneumothorax. Osteopenia. Severe cervical facet arthropathy. IMPRESSION: Interstitial and alveolar airspace opacities seen with pneumonia and/or pulmonary edema. Small pleural effusions. Mild cardiomegaly. Aortic Atherosclerosis (ICD10-I70.0). Electronically Signed   By: Awilda Metroourtnay  Bloomer M.D.   On: 05/16/2017 03:26   Ct Angio Chest Pe W And/or Wo Contrast  Result Date: 05/16/2017 CLINICAL DATA:  Shortness of breath. On antibiotics for upper respiratory tract infection. History of asthma. EXAM: CT ANGIOGRAPHY CHEST WITH CONTRAST TECHNIQUE: Multidetector CT imaging of the chest was performed using the standard protocol during bolus administration of intravenous contrast.  Multiplanar CT image reconstructions and MIPs were obtained to evaluate the vascular anatomy. CONTRAST:  75mL OMNIPAQUE IOHEXOL 350 MG/ML SOLN COMPARISON:  Chest radiograph May 16, 2017 and CT chest November 01, 2010 FINDINGS: CARDIOVASCULAR: Adequate contrast opacification of the pulmonary artery's. Main pulmonary artery is not enlarged. No pulmonary arterial filling defects to the level of the subsegmental branches. Heart size is mildly enlarged, no right heart strain. Trace pericardial effusion. Thoracic aorta is normal course and caliber, mild calcific atherosclerosis. Mild coronary artery calcifications. MEDIASTINUM/NODES: No lymphadenopathy by CT size criteria. LUNGS/PLEURA: Tracheobronchial tree is patent, no pneumothorax. Bronchial wall thickening. Moderate RIGHT, small LEFT pleural effusions. Interlobular septal thickening. Diffusely hazy appearance the lungs with ground-glass opacities. 6 mm subsolid pulmonary nodule LEFT upper lobe (series 6, image 43) UPPER ABDOMEN: Included view of the abdomen is unremarkable. MUSCULOSKELETAL: Mild heterogeneous thyroid without dominant nodule. LEFT shoulder loose bodies. Moderate mid to lower thoracic spine spondylosis associated with exaggerated kyphosis. Review of the MIP images confirms the above findings. IMPRESSION: 1. No acute pulmonary embolism. 2. Mild cardiomegaly. Moderate RIGHT and small LEFT pleural effusions. Ground-glass opacities most compatible with pulmonary edema. 3. Bronchial wall thickening seen with bronchitis/reactive airway disease  or pulmonary edema. 4. **An incidental finding of potential clinical significance has been found. 6 mm subsolid LEFT upper lobe pulmonary nodule. Follow-up non-contrast CT recommended at 3-6 months to confirm persistence. If unchanged, and solid component remains <6 mm, annual CT is recommended until 5 years of stability has been established. If persistent these nodules should be considered highly suspicious if the  solid component of the nodule is 6 mm or greater in size and enlarging. This recommendation follows the consensus statement: Guidelines for Management of Incidental Pulmonary Nodules Detected on CT Images: From the Fleischner Society 2017; Radiology 2017; 284:228-243.** Aortic Atherosclerosis (ICD10-I70.0). Electronically Signed   By: Awilda Metro M.D.   On: 05/16/2017 06:31      IMPRESSION AND PLAN:   A/P: 57F pleural effusions, possible NSTEMI, possible pneumonia.  1.) Pleural effusions: Lung exam (+) diminished breath sounds at B/L bases, bibasilar fine crackles. CT chest (+) B/L pleural effusion (moderate R + small L). BNP 461. Suspect cardiac etiology for effusions (acute systolic CHF), though infectious etiology is still possible. This is discussed below. Received Lasix 60mg  IV x1 in ED, c/w 40mg  IV BID.  2.) Possible NSTEMI: Trop-I 0.41. Repeat pending, f/up. EKG (+) TWI I, aVL, V2. Pt p/w pleural effusions, (+) examination + CT findings. Presentation concerning for silent NSTEMI, subsequent cardiomyopathy/CHF as etiology for effusions. Started on Heparin gtt in ED. F/up Trop-I. ZOX09. Tele. Echocardiogram. Cardiology consult. Resume home statin, beta blocker, ARB. Lasix 40mg  IV BID as above (as tolerated, monitor for renal insult given pt's Hx of L renal atrophy and CKD).  3.) SIRS/Possible pneumonia: (+) tachycardia, tachypnea, leukocytosis. SIRS (+). Pt w/ recent viral URI, presentation concerning for superimposed acute bacterial pneumonia w/ parapneumonic effusions (though this is less likely to be responsible for pt's effusions, which are also potentially due to cardiomyopathy/CHF in the setting of NSTEMI, as discussed above). If pt indeed w/ pna, suspect acute bacterial community acquired pneumonia (S. Pneumo, haemophilus, moraxella). Rapid Flu, UStrep + ULegionella pending. Started on Ceftriaxone + Azithromycin in ED, continued for now.  4.) Hypokalemia: K+ 3.2. Mag level pending.  Replete and monitor.  5.) HTN: c/w home Norvasc, Coreg, Clonidine, Losartan.  6.) HLD: c/w home Crestor.  7.) COPD/asthma: PRN nebs. (-) wheezing, not actively in COPD/asthma exacerbation.  8.) CKD: Cr 1.1 on admission, at baseline (0.9-1.2). Monitor Cr, avoid nephrotoxins.  9.) FEN/GI: Cardiac diet, Famotidine as formulary substitution for home Ranitidine.  10.) DVT PPx: Full-dose therapeutic anticoagulation w/ Heparin gtt.  11.) Code status: Full code.  12.) Disposition: Admission, pt expected to stay > 2 midnights.  All the records are reviewed and case discussed with ED provider. Management plans discussed with the patient, family and they are in agreement.  CODE STATUS: Full code.  TOTAL TIME TAKING CARE OF THIS PATIENT: 90 minutes.    Barbaraann Rondo M.D on 05/16/2017 at 7:12 AM  Between 7am to 6pm - Pager - [phone]  After 6pm go to [email or web address] - Scientist, research (life sciences) [facility]  Office  [phone]  CC: Primary care physician; Glori Luis, MD   Note: This dictation was prepared with Dragon dictation along with smaller phrase technology. Any transcriptional errors that result from this process are unintentional.

## 2017-05-16 NOTE — ED Provider Notes (Signed)
[facility] Emergency Department Provider Note    First MD Initiated Contact with Patient 05/16/17 [phone]     (approximate)  I have reviewed the triage vital signs and the nursing notes.   HISTORY  Chief Complaint Shortness of Breath    HPI Audrey Wolf is a 67 y.o. female with a history of chronic bronchitis and asthma presents with chief complaint of worsening shortness of breath.  Currently on amoxicillin for upper respiratory infection.  She is not given any steroids is just been using her home rescue inhaler without any improvement.  States she could not get to sleep tonight because she was having worsening shortness of breath not improved with try to sleep in her recliner.  States that she feels that she has a rattle in her chest.  No measured fevers.  No recent steroid use or recent hospitalizations.  Past Medical History:  Diagnosis Date  . Arthritis   . Asthma   . Chronic kidney disease   . Hypertension   . Nephrolithiasis    Family History  Problem Relation Age of Onset  . Arthritis Mother   . Arthritis Father   . Heart disease Father   . Stroke Father   . Sudden Cardiac Death Father   . Breast cancer Maternal Aunt    Past Surgical History:  Procedure Laterality Date  . ABDOMINAL HYSTERECTOMY    . APPENDECTOMY    . BREAST BIOPSY Left 12/31/2010   neg  . LITHOTRIPSY    . TONSILLECTOMY     Patient Active Problem List   Diagnosis Date Noted  . Dyspnea on exertion 04/20/2017  . Bronchitis 04/20/2017  . Periumbilical abdominal pain 10/23/2016  . Hypertension 06/05/2016  . Hyperlipidemia 06/05/2016  . GERD (gastroesophageal reflux disease) 06/05/2016  . CKD (chronic kidney disease) stage 2, GFR 60-89 ml/min 06/05/2016  . Ureteral stricture, left 03/29/2014  . Unilateral small kidney 03/02/2013      Prior to Admission medications   Medication Sig Start Date End Date Taking? Authorizing Provider  acetaminophen (TYLENOL) 500  MG tablet Take 500 mg by mouth daily.   Yes [provider]  albuterol (PROVENTIL HFA;VENTOLIN HFA) 108 (90 Base) MCG/ACT inhaler Inhale 2 puffs into the lungs every 6 (six) hours as needed for wheezing or shortness of breath. 04/20/17  Yes Glori Luis, MD  amLODipine (NORVASC) 10 MG tablet Take 10 mg by mouth daily.  03/01/12  Yes [provider]  ASPIRIN 81 PO Take 81 mg by mouth.   Yes [provider]  cloNIDine (CATAPRES - DOSED IN MG/24 HR) 0.3 mg/24hr patch Place 1 patch (0.3 mg total) onto the skin once a week. 09/05/16  Yes Cook, Jayce G, DO  hydrALAZINE (APRESOLINE) 100 MG tablet Take 1 tablet (100 mg total) by mouth 3 (three) times daily. 10/10/16  Yes Cook, Jayce G, DO  loratadine (CLARITIN) 10 MG tablet Take 10 mg by mouth daily.    Yes [provider]  losartan (COZAAR) 25 MG tablet Take 1 tablet (25 mg total) by mouth daily. 12/02/16  Yes Glori Luis, MD  RaNITidine HCl (ZANTAC PO) Take 150 mg by mouth daily.    Yes [provider]  rosuvastatin (CRESTOR) 20 MG tablet Take 1 tablet (20 mg total) by mouth daily. 06/06/16  Yes Cook, Jayce G, DO  carvedilol (COREG) 3.125 MG tablet Take 1 tablet (3.125 mg total) by mouth 2 (two) times daily with a meal. Patient not taking: Reported  on 05/07/2017 11/17/16   Dale [place], MD    Allergies Ace inhibitors    Social History Social History   Tobacco Use  . Smoking status: Never Smoker  . Smokeless tobacco: Never Used  Substance Use Topics  . Alcohol use: No  . Drug use: No    Review of Systems Patient denies headaches, rhinorrhea, blurry vision, numbness, shortness of breath, chest pain, edema, cough, abdominal pain, nausea, vomiting, diarrhea, dysuria, fevers, rashes or hallucinations unless otherwise stated above in HPI. ____________________________________________   PHYSICAL EXAM:  VITAL SIGNS: Vitals:   05/16/17 0615 05/16/17 0622  BP: 140/89   Pulse:    Resp:      Temp:    SpO2: 91% 95%    Constitutional: Alert and oriented. in no acute distress. Eyes: Conjunctivae are normal.  Head: Atraumatic. Nose: No congestion/rhinnorhea. Mouth/Throat: Mucous membranes are moist.   Neck: No stridor. Painless ROM.  Cardiovascular: mildly tachycardic regular rhythm. Grossly normal heart sounds.  Good peripheral circulation. Respiratory:mild tachypnea, diffuse wheeze throughout anterior lung fields.  Diminished posterior breathsounds Gastrointestinal: Soft and nontender. No distention. No abdominal bruits. No CVA tenderness. Genitourinary:  Musculoskeletal: No lower extremity tenderness nor edema.  No joint effusions. Neurologic:  Normal speech and language. No gross focal neurologic deficits are appreciated. No facial droop Skin:  Skin is warm, dry and intact. No rash noted. Psychiatric: Mood and affect are normal. Speech and behavior are normal.  ____________________________________________   LABS (all labs ordered are listed, but only abnormal results are displayed)  Results for orders placed or performed during the hospital encounter of 05/16/17 (from the past 24 hour(s))  Basic metabolic panel     Status: Abnormal   Collection Time: 05/16/17  3:08 AM  Result Value Ref Range   Sodium 141 135 - 145 mmol/L   Potassium 3.2 (L) 3.5 - 5.1 mmol/L   Chloride 114 (H) 101 - 111 mmol/L   CO2 20 (L) 22 - 32 mmol/L   Glucose, Bld 140 (H) 65 - 99 mg/dL   BUN 18 6 - 20 mg/dL   Creatinine, Ser 1.61 (H) 0.44 - 1.00 mg/dL   Calcium 8.5 (L) 8.9 - 10.3 mg/dL   GFR calc non Af Amer 50 (L) >60 mL/min   GFR calc Af Amer 58 (L) >60 mL/min   Anion gap 7 5 - 15  Troponin I     Status: Abnormal   Collection Time: 05/16/17  3:08 AM  Result Value Ref Range   Troponin I 0.41 (HH) <0.03 ng/mL  CBC with Differential/Platelet     Status: Abnormal   Collection Time: 05/16/17  3:19 AM  Result Value Ref Range   WBC 13.3 (H) 3.6 - 11.0 K/uL   RBC 4.26 3.80 - 5.20 MIL/uL    Hemoglobin 13.2 12.0 - 16.0 g/dL   HCT 09.6 04.5 - 40.9 %   MCV 89.1 80.0 - 100.0 fL   MCH 31.0 26.0 - 34.0 pg   MCHC 34.8 32.0 - 36.0 g/dL   RDW 81.1 91.4 - 78.2 %   Platelets 211 150 - 440 K/uL   Neutrophils Relative % 82 %   Neutro Abs 11.0 (H) 1.4 - 6.5 K/uL   Lymphocytes Relative 10 %   Lymphs Abs 1.3 1.0 - 3.6 K/uL   Monocytes Relative 7 %   Monocytes Absolute 0.9 0.2 - 0.9 K/uL   Eosinophils Relative 0 %   Eosinophils Absolute 0.0 0 - 0.7 K/uL   Basophils Relative 1 %  Basophils Absolute 0.1 0 - 0.1 K/uL  Brain natriuretic peptide     Status: Abnormal   Collection Time: 05/16/17  3:33 AM  Result Value Ref Range   B Natriuretic Peptide 461.0 (H) 0.0 - 100.0 pg/mL  Lactic acid, plasma     Status: Abnormal   Collection Time: 05/16/17  4:27 AM  Result Value Ref Range   Lactic Acid, Venous 2.5 (HH) 0.5 - 1.9 mmol/L  APTT     Status: None   Collection Time: 05/16/17  4:39 AM  Result Value Ref Range   aPTT 31 24 - 36 seconds  Protime-INR     Status: None   Collection Time: 05/16/17  4:39 AM  Result Value Ref Range   Prothrombin Time 13.1 11.4 - 15.2 seconds   INR 1.00    ____________________________________________  EKG My review and personal interpretation at Time: 3:11   Indication: sob  Rate: 120  Rhythm: sinus Axis: normal Other: normal intervals, non specific st abn, no stemi ____________________________________________  RADIOLOGY  I personally reviewed all radiographic images ordered to evaluate for the above acute complaints and reviewed radiology reports and findings.  These findings were personally discussed with the patient.  Please see medical record for radiology report.  ____________________________________________   PROCEDURES  Procedure(s) performed:  .Critical Care Performed by: Willy Eddy, MD Authorized by: Willy Eddy, MD   Critical care provider statement:    Critical care time (minutes):  35   Critical care time was  exclusive of:  Separately billable procedures and treating other patients   Critical care was necessary to treat or prevent imminent or life-threatening deterioration of the following conditions:  Cardiac failure   Critical care was time spent personally by me on the following activities:  Development of treatment plan with patient or surrogate, discussions with consultants, evaluation of patient's response to treatment, examination of patient, obtaining history from patient or surrogate, ordering and performing treatments and interventions, ordering and review of laboratory studies, ordering and review of radiographic studies, pulse oximetry, re-evaluation of patient's condition and review of old charts      Critical Care performed: yes ____________________________________________   INITIAL IMPRESSION / ASSESSMENT AND PLAN / ED COURSE  Pertinent labs & imaging results that were available during my care of the patient were reviewed by me and considered in my medical decision making (see chart for details).  DDX: Asthma, copd, CHF, pna, ptx, malignancy, Pe, anemia   JOCHEBED BILLS is a 67 y.o. who presents to the ED with shortness of breath as described above.  Patient does describe some chest pressure.  EKG does show some nonspecific ST segment changes but no STEMI criteria.  Patient has diminished breath sounds bilaterally does have some component of wheeze and very bronchitic sounding cough therefore will give nebulizer treatment steroids and reassess.  Clinical Course as of May 17 646  Sat May 16, 2017  1610 Patient's troponin and BNP are elevated.  Still with persistent cough and now describing pleuritic chest pain will give IV pain medication.  Patient does meet some criteria for sepsis given white count concern for possible pneumonia on chest x-ray and tachycardia therefore will give dose of antibiotics for community acquired pneumonia.  Will order CT angiogram to exclude pulmonary  embolism.  We will heparinize due to concern for an STEMI as well as possible PE.   [PR]    Clinical Course User Index [PR] Willy Eddy, MD     As part of my  medical decision making, I reviewed the following data within the electronic MEDICAL RECORD NUMBER Nursing notes reviewed and incorporated, Labs reviewed, notes from prior ED visits and [place] Controlled Substance Database   ____________________________________________   FINAL CLINICAL IMPRESSION(S) / ED DIAGNOSES  Final diagnoses:  Acute respiratory failure with hypoxia (HCC)  Shortness of breath  Chronic bilateral pleural effusions  Elevated troponin I level      NEW MEDICATIONS STARTED DURING THIS VISIT:  New Prescriptions   No medications on file     Note:  This document was prepared using Dragon voice recognition software and may include unintentional dictation errors.    Willy Eddyobinson, Jennifier Smitherman, MD 05/16/17 [phone]

## 2017-05-16 NOTE — ED Notes (Signed)
Date and time results received: 05/16/17 1054  Test: Lactic Acid Critical Value: 2.2  Name of Provider Notified: Dr. Marjie SkiffSridharan  Orders Received? Or Actions Taken? No new orders. MD stated that he would let Dr. Renae GlossWieting know.

## 2017-05-16 NOTE — ED Notes (Signed)
ED Provider at bedside. 

## 2017-05-16 NOTE — ED Notes (Addendum)
Report received - pt to be admitted. Has positive troponin, lactic 2.2, r/o influenza. Chf. Pt is on a portable bp cuff - all but one bp reading previous wiped out by accident per Huntley DecSara

## 2017-05-16 NOTE — Progress Notes (Signed)
*  PRELIMINARY RESULTS* Echocardiogram 2D Echocardiogram has been performed.  Audrey Wolf 05/16/2017, 4:22 PM

## 2017-05-16 NOTE — ED Notes (Signed)
Pt report given to [place] a.

## 2017-05-16 NOTE — ED Triage Notes (Signed)
Patient c/o SOB. Patient is currently on amoxicillin for URI. Patient has current dx of both bronchitis URI.

## 2017-05-16 NOTE — Progress Notes (Signed)
ANTICOAGULATION CONSULT NOTE - Consult  Pharmacy Consult for heparin Indication: chest pain/ACS  Allergies  Allergen Reactions  . Ace Inhibitors Other (See Comments)    Has been told to avoid these and any diuretics because "left kidney has shrunk up and doesn't work"    Patient Measurements: Height: 5\' 9"  (175.3 cm) Weight: 226 lb 6.4 oz (102.7 kg) IBW/kg (Calculated) : 66.2 Heparin Dosing Weight: 98.4 kg  Vital Signs: Temp: 97.8 F (36.6 C) (04/13 1433) Temp Source: Oral (04/13 1433) BP: 160/103 (04/13 1433) Pulse Rate: 101 (04/13 1433)  Labs: Recent Labs    05/16/17 0308 05/16/17 0319 05/16/17 0439 05/16/17 0732 05/16/17 1018 05/16/17 1344  HGB  --  13.2  --   --   --   --   HCT  --  38.0  --   --   --   --   PLT  --  211  --   --   --   --   APTT  --   --  31  --   --   --   LABPROT  --   --  13.1  --   --   --   INR  --   --  1.00  --   --   --   HEPARINUNFRC  --   --   --   --   --  0.47  CREATININE 1.11*  --   --   --   --   --   TROPONINI 0.41*  --   --  0.46* 0.54* 0.58*    Estimated Creatinine Clearance: 62.7 mL/min (A) (by C-G formula based on SCr of 1.11 mg/dL (H)).   Medical History: Past Medical History:  Diagnosis Date  . Arthritis   . Asthma   . Chronic kidney disease   . Hypertension   . Nephrolithiasis     Medications:  Scheduled:  . acetaminophen  500 mg Oral Daily  . amLODipine  10 mg Oral Daily  . [START ON 05/17/2017] aspirin EC  81 mg Oral Daily  . budesonide (PULMICORT) nebulizer solution  0.5 mg Nebulization BID  . carvedilol  3.125 mg Oral BID WC  . cloNIDine  0.3 mg Transdermal Weekly  . famotidine  10 mg Oral Daily  . furosemide  40 mg Intravenous BID  . hydrALAZINE  100 mg Oral TID  . ipratropium-albuterol  3 mL Nebulization Q6H  . loratadine  10 mg Oral Daily  . losartan  25 mg Oral Daily  . predniSONE  40 mg Oral Q breakfast  . rosuvastatin  20 mg Oral Daily    Assessment: Patient admitted for SOB w/ diagnosed  bronchitis and is on amoxicillin PTA.  Tn found to be 0.41, EKG still pending, heparin drip is being started and patient not on any PTA anticoagulation.  Goal of Therapy:  Heparin level 0.3-0.7 units/ml Monitor platelets by anticoagulation protocol: Yes   Plan:  Heparin level therapeutic; will continue current heparin drip rate and check next HL (anti-Xa) in 6 hours.  Will monitor daily CBC's and adjust per anti-Xa levels.  Cleopatra CedarStephanie Raheen Capili, PharmD Pharmacy Resident  05/16/2017

## 2017-05-16 NOTE — Progress Notes (Signed)
ANTICOAGULATION CONSULT NOTE - Initial Consult  Pharmacy Consult for heparin Indication: chest pain/ACS  Allergies  Allergen Reactions  . Ace Inhibitors Other (See Comments)    Has been told to avoid these and any diuretics because "left kidney has shrunk up and doesn't work"    Patient Measurements: Weight: 217 lb (98.4 kg) Heparin Dosing Weight: 98.4 kg  Vital Signs: Temp: 97.5 F (36.4 C) (04/13 0308) Temp Source: Oral (04/13 0308) BP: 137/74 (04/13 0354) Pulse Rate: 108 (04/13 0400)  Labs: Recent Labs    05/16/17 0308 05/16/17 0319 05/16/17 0439  HGB  --  13.2  --   HCT  --  38.0  --   PLT  --  211  --   APTT  --   --  31  LABPROT  --   --  13.1  INR  --   --  1.00  CREATININE 1.11*  --   --   TROPONINI 0.41*  --   --     CrCl cannot be calculated (Unknown ideal weight.).   Medical History: Past Medical History:  Diagnosis Date  . Arthritis   . Asthma   . Chronic kidney disease   . Hypertension   . Nephrolithiasis     Medications:  Scheduled:  . heparin  4,000 Units Intravenous Once    Assessment: Patient admitted for SOB w/ diagnosed bronchitis and is on amoxicillin PTA.  Trops found to be 0.41, EKG still pending, heparin drip is being started and patient not on any PTA anticoagulation.  Goal of Therapy:  Heparin level 0.3-0.7 units/ml Monitor platelets by anticoagulation protocol: Yes   Plan:  Will bolus heparin 4000 units IV x 1 Will start heparin drip @ 1100 units/hr Baseline labs drawn and WNL Will check an anti-Xa @ 1200 Will monitor daily CBC's and adjust per anti-Xa levels.  Thomasene Rippleavid Kadeem Hyle, PharmD, BCPS Clinical Pharmacist 05/16/2017

## 2017-05-16 NOTE — Plan of Care (Signed)
  Problem: Clinical Measurements: Goal: Respiratory complications will improve Outcome: Progressing   Problem: Pain Managment: Goal: General experience of comfort will improve Outcome: Progressing   Problem: Activity: Goal: Ability to tolerate increased activity will improve Outcome: Progressing

## 2017-05-16 NOTE — ED Notes (Signed)
The patient was unhooked from the monitor so she could walk to the toilet. Denies any shortness of breath or dizziness.

## 2017-05-17 DIAGNOSIS — I214 Non-ST elevation (NSTEMI) myocardial infarction: Secondary | ICD-10-CM

## 2017-05-17 LAB — CBC
HEMATOCRIT: 39.7 % (ref 35.0–47.0)
HEMOGLOBIN: 13.6 g/dL (ref 12.0–16.0)
MCH: 31 pg (ref 26.0–34.0)
MCHC: 34.2 g/dL (ref 32.0–36.0)
MCV: 90.7 fL (ref 80.0–100.0)
PLATELETS: 250 10*3/uL (ref 150–440)
RBC: 4.38 MIL/uL (ref 3.80–5.20)
RDW: 14.6 % — ABNORMAL HIGH (ref 11.5–14.5)
WBC: 13.2 10*3/uL — ABNORMAL HIGH (ref 3.6–11.0)

## 2017-05-17 LAB — BASIC METABOLIC PANEL
ANION GAP: 10 (ref 5–15)
BUN: 22 mg/dL — AB (ref 6–20)
CHLORIDE: 107 mmol/L (ref 101–111)
CO2: 23 mmol/L (ref 22–32)
Calcium: 8.6 mg/dL — ABNORMAL LOW (ref 8.9–10.3)
Creatinine, Ser: 1.17 mg/dL — ABNORMAL HIGH (ref 0.44–1.00)
GFR calc Af Amer: 55 mL/min — ABNORMAL LOW (ref >60)
GFR calc non Af Amer: 47 mL/min — ABNORMAL LOW (ref >60)
GLUCOSE: 123 mg/dL — AB (ref 65–99)
Potassium: 3.3 mmol/L — ABNORMAL LOW (ref 3.5–5.1)
Sodium: 140 mmol/L (ref 135–145)

## 2017-05-17 LAB — LIPID PANEL
CHOL/HDL RATIO: 2.5 ratio
CHOLESTEROL: 99 mg/dL (ref 0–200)
HDL: 39 mg/dL — AB (ref >40)
LDL CALC: 46 mg/dL (ref 0–99)
Triglycerides: 72 mg/dL (ref <150)
VLDL: 14 mg/dL (ref 0–40)

## 2017-05-17 LAB — HEPARIN LEVEL (UNFRACTIONATED): Heparin Unfractionated: 0.43 IU/mL (ref 0.30–0.70)

## 2017-05-17 MED ORDER — PREMIER PROTEIN SHAKE
11.0000 [oz_av] | ORAL | Status: DC
Start: 2017-05-17 — End: 2017-05-19
  Administered 2017-05-18: 11 [oz_av] via ORAL

## 2017-05-17 MED ORDER — ADULT MULTIVITAMIN W/MINERALS CH
1.0000 | ORAL_TABLET | Freq: Every day | ORAL | Status: DC
  Administered 2017-05-18: 1 via ORAL
  Filled 2017-05-17: qty 1

## 2017-05-17 MED ORDER — AZITHROMYCIN 250 MG PO TABS
250.0000 mg | ORAL_TABLET | Freq: Every day | ORAL | Status: DC
  Administered 2017-05-17 – 2017-05-18 (×2): 250 mg via ORAL
  Filled 2017-05-17 (×2): qty 1

## 2017-05-17 MED ORDER — PREDNISONE 20 MG PO TABS
30.0000 mg | ORAL_TABLET | Freq: Every day | ORAL | Status: DC
  Administered 2017-05-18: 30 mg via ORAL
  Filled 2017-05-17: qty 1

## 2017-05-17 MED ORDER — FUROSEMIDE 40 MG PO TABS
40.0000 mg | ORAL_TABLET | Freq: Every day | ORAL | Status: DC
  Administered 2017-05-17: 40 mg via ORAL
  Filled 2017-05-17: qty 1

## 2017-05-17 NOTE — Progress Notes (Signed)
ANTICOAGULATION CONSULT NOTE - Consult  Pharmacy Consult for heparin Indication: chest pain/ACS  Allergies  Allergen Reactions  . Ace Inhibitors Other (See Comments)    Has been told to avoid these and any diuretics because "left kidney has shrunk up and doesn't work"    Patient Measurements: Height: 5\' 9"  (175.3 cm) Weight: 218 lb 12.8 oz (99.2 kg) IBW/kg (Calculated) : 66.2 Heparin Dosing Weight: 98.4 kg  Vital Signs: Temp: 98.1 F (36.7 C) (04/14 0518) Temp Source: Oral (04/14 0518) BP: 138/77 (04/14 0518) Pulse Rate: 94 (04/14 0518)  Labs: Recent Labs    05/16/17 0308 05/16/17 0319 05/16/17 0439 05/16/17 0732 05/16/17 1018 05/16/17 1344 05/16/17 2223 05/17/17 0519  HGB  --  13.2  --   --   --   --   --  13.6  HCT  --  38.0  --   --   --   --   --  39.7  PLT  --  211  --   --   --   --   --  250  APTT  --   --  31  --   --   --   --   --   LABPROT  --   --  13.1  --   --   --   --   --   INR  --   --  1.00  --   --   --   --   --   HEPARINUNFRC  --   --   --   --   --  0.47 0.40 0.43  CREATININE 1.11*  --   --   --   --   --   --  1.17*  TROPONINI 0.41*  --   --  0.46* 0.54* 0.58*  --   --     Estimated Creatinine Clearance: 58.5 mL/min (A) (by C-G formula based on SCr of 1.17 mg/dL (H)).   Medical History: Past Medical History:  Diagnosis Date  . Arthritis   . Asthma   . Chronic kidney disease   . Hypertension   . Nephrolithiasis     Medications:  Scheduled:  . acetaminophen  500 mg Oral Daily  . amLODipine  10 mg Oral Daily  . aspirin EC  81 mg Oral Daily  . budesonide (PULMICORT) nebulizer solution  0.5 mg Nebulization BID  . carvedilol  3.125 mg Oral BID WC  . cloNIDine  0.3 mg Transdermal Weekly  . famotidine  10 mg Oral Daily  . furosemide  40 mg Intravenous BID  . hydrALAZINE  100 mg Oral TID  . ipratropium-albuterol  3 mL Nebulization Q6H  . loratadine  10 mg Oral Daily  . losartan  25 mg Oral Daily  . predniSONE  40 mg Oral Q  breakfast  . rosuvastatin  20 mg Oral Daily    Assessment: Patient admitted for SOB w/ diagnosed bronchitis and is on amoxicillin PTA.  Tn found to be 0.41, EKG still pending, heparin drip is being started and patient not on any PTA anticoagulation.  Goal of Therapy:  Heparin level 0.3-0.7 units/ml Monitor platelets by anticoagulation protocol: Yes   Plan:  Heparin level therapeutic; will continue current heparin drip rate and check next HL (anti-Xa) in 6 hours.  Will monitor daily CBC's and adjust per anti-Xa levels.  04/14 @ 0500 HL 0.43 therapeutic. Will continue current rate and will recheck w/ am labs. CBC stable.  Thomasene Ripple, PharmD, BCPS  Clinical Pharmacist 05/17/2017

## 2017-05-17 NOTE — Consult Note (Signed)
Audrey Wolf is a 67 y.o. female  [id number]  Primary Cardiologist: Adrian Blackwater Reason for Consultation: elevated troponin/non-STEMI  HPI: 67 year old white female with a past medical history of asthma arthritis hypertension nephrolithiasis presented to the hospital with shortness of breath orthopnea and cough and had elevated troponin 3 sets,  And I was asked to evaluate the patient.   Review of Systems: no chest pain but does have orthopnea and no leg swelling   Past Medical History:  Diagnosis Date  . Arthritis   . Asthma   . Chronic kidney disease   . Hypertension   . Nephrolithiasis     Medications Prior to Admission  Medication Sig Dispense Refill  . acetaminophen (TYLENOL) 500 MG tablet Take 500 mg by mouth daily.    Marland Kitchen albuterol (PROVENTIL HFA;VENTOLIN HFA) 108 (90 Base) MCG/ACT inhaler Inhale 2 puffs into the lungs every 6 (six) hours as needed for wheezing or shortness of breath. 1 Inhaler 0  . amLODipine (NORVASC) 10 MG tablet Take 10 mg by mouth daily.     . ASPIRIN 81 PO Take 81 mg by mouth.    . cloNIDine (CATAPRES - DOSED IN MG/24 HR) 0.3 mg/24hr patch Place 1 patch (0.3 mg total) onto the skin once a week. 12 patch 3  . hydrALAZINE (APRESOLINE) 100 MG tablet Take 1 tablet (100 mg total) by mouth 3 (three) times daily. 270 tablet 1  . loratadine (CLARITIN) 10 MG tablet Take 10 mg by mouth daily.     Marland Kitchen losartan (COZAAR) 25 MG tablet Take 1 tablet (25 mg total) by mouth daily. 30 tablet 1  . RaNITidine HCl (ZANTAC PO) Take 150 mg by mouth daily.     . rosuvastatin (CRESTOR) 20 MG tablet Take 1 tablet (20 mg total) by mouth daily. 90 tablet 3  . carvedilol (COREG) 3.125 MG tablet Take 1 tablet (3.125 mg total) by mouth 2 (two) times daily with a meal. (Patient not taking: Reported on 05/07/2017) 60 tablet 3     . acetaminophen  500 mg Oral Daily  . amLODipine  10 mg Oral Daily  . aspirin EC  81 mg Oral Daily  . budesonide (PULMICORT) nebulizer solution  0.5  mg Nebulization BID  . carvedilol  3.125 mg Oral BID WC  . cloNIDine  0.3 mg Transdermal Weekly  . famotidine  10 mg Oral Daily  . furosemide  40 mg Intravenous BID  . hydrALAZINE  100 mg Oral TID  . ipratropium-albuterol  3 mL Nebulization Q6H  . loratadine  10 mg Oral Daily  . losartan  25 mg Oral Daily  . predniSONE  40 mg Oral Q breakfast  . rosuvastatin  20 mg Oral Daily    Infusions: . azithromycin Stopped (05/17/17 1884)  . cefTRIAXone (ROCEPHIN)  IV Stopped (05/17/17 0506)  . heparin 1,100 Units/hr (05/16/17 2134)    Allergies  Allergen Reactions  . Ace Inhibitors Other (See Comments)    Has been told to avoid these and any diuretics because "left kidney has shrunk up and doesn't work"    Social History   Socioeconomic History  . Marital status: Single    Spouse name: Not on file  . Number of children: Not on file  . Years of education: Not on file  . Highest education level: Not on file  Occupational History    Employer: [employer]  Social Needs  . Financial resource strain: Not on file  . Food insecurity:  Worry: Not on file    Inability: Not on file  . Transportation needs:    Medical: Not on file    Non-medical: Not on file  Tobacco Use  . Smoking status: Never Smoker  . Smokeless tobacco: Never Used  Substance and Sexual Activity  . Alcohol use: No  . Drug use: No  . Sexual activity: Never    Partners: Male  Lifestyle  . Physical activity:    Days per week: Not on file    Minutes per session: Not on file  . Stress: Not on file  Relationships  . Social connections:    Talks on phone: Not on file    Gets together: Not on file    Attends religious service: Not on file    Active member of club or organization: Not on file    Attends meetings of clubs or organizations: Not on file    Relationship status: Not on file  . Intimate partner violence:    Fear of current or ex partner: Not on file    Emotionally abused: Not on file    Physically  abused: Not on file    Forced sexual activity: Not on file  Other Topics Concern  . Not on file  Social History Narrative  . Not on file    Family History  Problem Relation Age of Onset  . Arthritis Mother   . Arthritis Father   . Heart disease Father   . Stroke Father   . Sudden Cardiac Death Father   . Breast cancer Maternal Aunt     PHYSICAL EXAM: Vitals:   05/17/17 0518 05/17/17 0900  BP: 138/77 134/68  Pulse: 94 86  Resp: 17 16  Temp: 98.1 F (36.7 C) 97.8 F (36.6 C)  SpO2: 92% 98%     Intake/Output Summary (Last 24 hours) at 05/17/2017 1010 Last data filed at 05/17/2017 0906 Gross per 24 hour  Intake 856.48 ml  Output 1100 ml  Net -243.52 ml    General:  Well appearing. No respiratory difficulty HEENT: normal Neck: supple. no JVD. Carotids 2+ bilat; no bruits. No lymphadenopathy or thryomegaly appreciated. Cor: PMI nondisplaced. Regular rate & rhythm. No rubs, gallops or murmurs. Lungs: clear Abdomen: soft, nontender, nondistended. No hepatosplenomegaly. No bruits or masses. Good bowel sounds. Extremities: no cyanosis, clubbing, rash, edema Neuro: alert & oriented x 3, cranial nerves grossly intact. moves all 4 extremities w/o difficulty. Affect pleasant.  ECG: normal sinus rhythm with anterolateral lateral ST depression  Results for orders placed or performed during the hospital encounter of 05/16/17 (from the past 24 hour(s))  Troponin I     Status: Abnormal   Collection Time: 05/16/17 10:18 AM  Result Value Ref Range   Troponin I 0.54 (HH) <0.03 ng/mL  Strep pneumoniae urinary antigen     Status: None   Collection Time: 05/16/17 10:18 AM  Result Value Ref Range   Strep Pneumo Urinary Antigen NEGATIVE NEGATIVE  Magnesium     Status: None   Collection Time: 05/16/17 10:18 AM  Result Value Ref Range   Magnesium 1.7 1.7 - 2.4 mg/dL  Lactic acid, plasma     Status: Abnormal   Collection Time: 05/16/17 10:18 AM  Result Value Ref Range   Lactic Acid,  Venous 2.2 (HH) 0.5 - 1.9 mmol/L  Heparin level (unfractionated)     Status: None   Collection Time: 05/16/17  1:44 PM  Result Value Ref Range   Heparin Unfractionated 0.47 0.30 - 0.70  IU/mL  Troponin I     Status: Abnormal   Collection Time: 05/16/17  1:44 PM  Result Value Ref Range   Troponin I 0.58 (HH) <0.03 ng/mL  Heparin level (unfractionated)     Status: None   Collection Time: 05/16/17 10:23 PM  Result Value Ref Range   Heparin Unfractionated 0.40 0.30 - 0.70 IU/mL  CBC     Status: Abnormal   Collection Time: 05/17/17  5:19 AM  Result Value Ref Range   WBC 13.2 (H) 3.6 - 11.0 K/uL   RBC 4.38 3.80 - 5.20 MIL/uL   Hemoglobin 13.6 12.0 - 16.0 g/dL   HCT 57.8 46.9 - 62.9 %   MCV 90.7 80.0 - 100.0 fL   MCH 31.0 26.0 - 34.0 pg   MCHC 34.2 32.0 - 36.0 g/dL   RDW 52.8 (H) 41.3 - 24.4 %   Platelets 250 150 - 440 K/uL  Basic metabolic panel     Status: Abnormal   Collection Time: 05/17/17  5:19 AM  Result Value Ref Range   Sodium 140 135 - 145 mmol/L   Potassium 3.3 (L) 3.5 - 5.1 mmol/L   Chloride 107 101 - 111 mmol/L   CO2 23 22 - 32 mmol/L   Glucose, Bld 123 (H) 65 - 99 mg/dL   BUN 22 (H) 6 - 20 mg/dL   Creatinine, Ser 0.10 (H) 0.44 - 1.00 mg/dL   Calcium 8.6 (L) 8.9 - 10.3 mg/dL   GFR calc non Af Amer 47 (L) >60 mL/min   GFR calc Af Amer 55 (L) >60 mL/min   Anion gap 10 5 - 15  Heparin level (unfractionated)     Status: None   Collection Time: 05/17/17  5:19 AM  Result Value Ref Range   Heparin Unfractionated 0.43 0.30 - 0.70 IU/mL   Dg Chest 2 View  Result Date: 05/16/2017 CLINICAL DATA:  Shortness of breath.  On antibiotics for bronchitis. EXAM: CHEST - 2 VIEW COMPARISON:  Chest radiograph May 22, 2010 FINDINGS: Diffuse interstitial prominence with patchy bibasilar airspace opacities and small pleural effusions. Cardiac silhouette is mildly enlarged. Calcified aortic knob. No pneumothorax. LEFT upper lobe granuloma versus shoulder loose body. No pneumothorax.  Osteopenia. Severe cervical facet arthropathy. IMPRESSION: Interstitial and alveolar airspace opacities seen with pneumonia and/or pulmonary edema. Small pleural effusions. Mild cardiomegaly. Aortic Atherosclerosis (ICD10-I70.0). Electronically Signed   By: Awilda Metro M.D.   On: 05/16/2017 03:26   Ct Angio Chest Pe W And/or Wo Contrast  Result Date: 05/16/2017 CLINICAL DATA:  Shortness of breath. On antibiotics for upper respiratory tract infection. History of asthma. EXAM: CT ANGIOGRAPHY CHEST WITH CONTRAST TECHNIQUE: Multidetector CT imaging of the chest was performed using the standard protocol during bolus administration of intravenous contrast. Multiplanar CT image reconstructions and MIPs were obtained to evaluate the vascular anatomy. CONTRAST:  75mL OMNIPAQUE IOHEXOL 350 MG/ML SOLN COMPARISON:  Chest radiograph May 16, 2017 and CT chest November 01, 2010 FINDINGS: CARDIOVASCULAR: Adequate contrast opacification of the pulmonary artery's. Main pulmonary artery is not enlarged. No pulmonary arterial filling defects to the level of the subsegmental branches. Heart size is mildly enlarged, no right heart strain. Trace pericardial effusion. Thoracic aorta is normal course and caliber, mild calcific atherosclerosis. Mild coronary artery calcifications. MEDIASTINUM/NODES: No lymphadenopathy by CT size criteria. LUNGS/PLEURA: Tracheobronchial tree is patent, no pneumothorax. Bronchial wall thickening. Moderate RIGHT, small LEFT pleural effusions. Interlobular septal thickening. Diffusely hazy appearance the lungs with ground-glass opacities. 6 mm subsolid pulmonary nodule LEFT  upper lobe (series 6, image 43) UPPER ABDOMEN: Included view of the abdomen is unremarkable. MUSCULOSKELETAL: Mild heterogeneous thyroid without dominant nodule. LEFT shoulder loose bodies. Moderate mid to lower thoracic spine spondylosis associated with exaggerated kyphosis. Review of the MIP images confirms the above findings.  IMPRESSION: 1. No acute pulmonary embolism. 2. Mild cardiomegaly. Moderate RIGHT and small LEFT pleural effusions. Ground-glass opacities most compatible with pulmonary edema. 3. Bronchial wall thickening seen with bronchitis/reactive airway disease or pulmonary edema. 4. **An incidental finding of potential clinical significance has been found. 6 mm subsolid LEFT upper lobe pulmonary nodule. Follow-up non-contrast CT recommended at 3-6 months to confirm persistence. If unchanged, and solid component remains <6 mm, annual CT is recommended until 5 years of stability has been established. If persistent these nodules should be considered highly suspicious if the solid component of the nodule is 6 mm or greater in size and enlarging. This recommendation follows the consensus statement: Guidelines for Management of Incidental Pulmonary Nodules Detected on CT Images: From the Fleischner Society 2017; Radiology 2017; 284:228-243.** Aortic Atherosclerosis (ICD10-I70.0). Electronically Signed   By: Awilda Metroourtnay  Bloomer M.D.   On: 05/16/2017 06:31     ASSESSMENT AND PLAN: anterolateral ischemia on EKG with elevated troponin and symptoms of heart failure with borderline left reticular systolic function approximately 50%. Advise cardiac catheterization since 2 years ago had a negative stress test. Patient has agreed to the procedure and risks and benefits have been explained to the patient. Will be set up for tomorrow.  Primitivo Merkey A

## 2017-05-17 NOTE — Progress Notes (Signed)
Patient ID: Audrey Wolf, female   DOB: 08-May-1950, 67 y.o.   MRN: [id number]  [facility] PROGRESS NOTE  BRIONA KORPELA WUJ:[id number] DOB: 07/28/1950 DOA: 05/16/2017 PCP: Glori Luis, MD  HPI/Subjective: Patient feeling better today.  Breathing more comfortably.  No complaints of any chest pain.  Objective: Vitals:   05/17/17 0518 05/17/17 0900  BP: 138/77 134/68  Pulse: 94 86  Resp: 17 16  Temp: 98.1 F (36.7 C) 97.8 F (36.6 C)  SpO2: 92% 98%    Filed Weights   05/16/17 0307 05/16/17 1433 05/17/17 0518  Weight: 98.4 kg (217 lb) 102.7 kg (226 lb 6.4 oz) 99.2 kg (218 lb 12.8 oz)    ROS: Review of Systems  Constitutional: Negative for chills and fever.  Eyes: Negative for blurred vision.  Respiratory: Positive for shortness of breath. Negative for cough.   Cardiovascular: Negative for chest pain.  Gastrointestinal: Negative for abdominal pain, constipation, diarrhea, nausea and vomiting.  Genitourinary: Negative for dysuria.  Musculoskeletal: Negative for joint pain.  Neurological: Negative for dizziness and headaches.   Exam: Physical Exam  Constitutional: She is oriented to person, place, and time.  HENT:  Nose: No mucosal edema.  Mouth/Throat: No oropharyngeal exudate or posterior oropharyngeal edema.  Eyes: Pupils are equal, round, and reactive to light. Conjunctivae, EOM and lids are normal.  Neck: No JVD present. Carotid bruit is not present. No edema present. No thyroid mass and no thyromegaly present.  Cardiovascular: S1 normal and S2 normal. Exam reveals no gallop.  No murmur heard. Pulses:      Dorsalis pedis pulses are 2+ on the right side, and 2+ on the left side.  Respiratory: No respiratory distress. She has decreased breath sounds in the right lower field and the left lower field. She has no wheezes. She has no rhonchi. She has no rales.  GI: Soft. Bowel sounds are normal. There is no tenderness.  Musculoskeletal:       Right  ankle: She exhibits no swelling.       Left ankle: She exhibits no swelling.  Lymphadenopathy:    She has no cervical adenopathy.  Neurological: She is alert and oriented to person, place, and time. No cranial nerve deficit.  Skin: Skin is warm. No rash noted. Nails show no clubbing.  Psychiatric: She has a normal mood and affect.      Data Reviewed: Basic Metabolic Panel: Recent Labs  Lab 05/16/17 0308 05/16/17 1018 05/17/17 0519  NA 141  --  140  K 3.2*  --  3.3*  CL 114*  --  107  CO2 20*  --  23  GLUCOSE 140*  --  123*  BUN 18  --  22*  CREATININE 1.11*  --  1.17*  CALCIUM 8.5*  --  8.6*  MG  --  1.7  --    CBC: Recent Labs  Lab 05/16/17 0319 05/17/17 0519  WBC 13.3* 13.2*  NEUTROABS 11.0*  --   HGB 13.2 13.6  HCT 38.0 39.7  MCV 89.1 90.7  PLT 211 250   Cardiac Enzymes: Recent Labs  Lab 05/16/17 0308 05/16/17 0732 05/16/17 1018 05/16/17 1344  TROPONINI 0.41* 0.46* 0.54* 0.58*   BNP (last 3 results) Recent Labs    05/16/17 0333  BNP 461.0*      Recent Results (from the past 240 hour(s))  Blood culture (routine x 2)     Status: None (Preliminary result)   Collection Time: 05/16/17  4:27 AM  Result  Value Ref Range Status   Specimen Description BLOOD RIGHT HAND  Final   Special Requests   Final    BOTTLES DRAWN AEROBIC AND ANAEROBIC Blood Culture adequate volume   Culture   Final    NO GROWTH 1 DAY Performed at [facility], [address], [place], [place] [address]    Report Status PENDING  Incomplete  Blood culture (routine x 2)     Status: None (Preliminary result)   Collection Time: 05/16/17  4:32 AM  Result Value Ref Range Status   Specimen Description BLOOD BLOOD LEFT WRIST  Final   Special Requests   Final    BOTTLES DRAWN AEROBIC AND ANAEROBIC Blood Culture adequate volume   Culture   Final    NO GROWTH < 24 HOURS Performed at [facility], [address], [place], [place] [address]    Report Status PENDING   Incomplete     Studies: Dg Chest 2 View  Result Date: 05/16/2017 CLINICAL DATA:  Shortness of breath.  On antibiotics for bronchitis. EXAM: CHEST - 2 VIEW COMPARISON:  Chest radiograph May 22, 2010 FINDINGS: Diffuse interstitial prominence with patchy bibasilar airspace opacities and small pleural effusions. Cardiac silhouette is mildly enlarged. Calcified aortic knob. No pneumothorax. LEFT upper lobe granuloma versus shoulder loose body. No pneumothorax. Osteopenia. Severe cervical facet arthropathy. IMPRESSION: Interstitial and alveolar airspace opacities seen with pneumonia and/or pulmonary edema. Small pleural effusions. Mild cardiomegaly. Aortic Atherosclerosis (ICD10-I70.0). Electronically Signed   By: Awilda Metro M.D.   On: 05/16/2017 03:26   Ct Angio Chest Pe W And/or Wo Contrast  Result Date: 05/16/2017 CLINICAL DATA:  Shortness of breath. On antibiotics for upper respiratory tract infection. History of asthma. EXAM: CT ANGIOGRAPHY CHEST WITH CONTRAST TECHNIQUE: Multidetector CT imaging of the chest was performed using the standard protocol during bolus administration of intravenous contrast. Multiplanar CT image reconstructions and MIPs were obtained to evaluate the vascular anatomy. CONTRAST:  75mL OMNIPAQUE IOHEXOL 350 MG/ML SOLN COMPARISON:  Chest radiograph May 16, 2017 and CT chest November 01, 2010 FINDINGS: CARDIOVASCULAR: Adequate contrast opacification of the pulmonary artery's. Main pulmonary artery is not enlarged. No pulmonary arterial filling defects to the level of the subsegmental branches. Heart size is mildly enlarged, no right heart strain. Trace pericardial effusion. Thoracic aorta is normal course and caliber, mild calcific atherosclerosis. Mild coronary artery calcifications. MEDIASTINUM/NODES: No lymphadenopathy by CT size criteria. LUNGS/PLEURA: Tracheobronchial tree is patent, no pneumothorax. Bronchial wall thickening. Moderate RIGHT, small LEFT pleural  effusions. Interlobular septal thickening. Diffusely hazy appearance the lungs with ground-glass opacities. 6 mm subsolid pulmonary nodule LEFT upper lobe (series 6, image 43) UPPER ABDOMEN: Included view of the abdomen is unremarkable. MUSCULOSKELETAL: Mild heterogeneous thyroid without dominant nodule. LEFT shoulder loose bodies. Moderate mid to lower thoracic spine spondylosis associated with exaggerated kyphosis. Review of the MIP images confirms the above findings. IMPRESSION: 1. No acute pulmonary embolism. 2. Mild cardiomegaly. Moderate RIGHT and small LEFT pleural effusions. Ground-glass opacities most compatible with pulmonary edema. 3. Bronchial wall thickening seen with bronchitis/reactive airway disease or pulmonary edema. 4. **An incidental finding of potential clinical significance has been found. 6 mm subsolid LEFT upper lobe pulmonary nodule. Follow-up non-contrast CT recommended at 3-6 months to confirm persistence. If unchanged, and solid component remains <6 mm, annual CT is recommended until 5 years of stability has been established. If persistent these nodules should be considered highly suspicious if the solid component of the nodule is 6 mm or greater in  size and enlarging. This recommendation follows the consensus statement: Guidelines for Management of Incidental Pulmonary Nodules Detected on CT Images: From the Fleischner Society 2017; Radiology 2017; 284:228-243.** Aortic Atherosclerosis (ICD10-I70.0). Electronically Signed   By: Awilda Metroourtnay  Bloomer M.D.   On: 05/16/2017 06:31    Scheduled Meds: . acetaminophen  500 mg Oral Daily  . amLODipine  10 mg Oral Daily  . aspirin EC  81 mg Oral Daily  . azithromycin  250 mg Oral Daily  . budesonide (PULMICORT) nebulizer solution  0.5 mg Nebulization BID  . carvedilol  3.125 mg Oral BID WC  . cloNIDine  0.3 mg Transdermal Weekly  . famotidine  10 mg Oral Daily  . furosemide  40 mg Oral Daily  . hydrALAZINE  100 mg Oral TID  .  ipratropium-albuterol  3 mL Nebulization Q6H  . loratadine  10 mg Oral Daily  . losartan  25 mg Oral Daily  . [START ON 05/18/2017] multivitamin with minerals  1 tablet Oral Daily  . [START ON 05/18/2017] predniSONE  30 mg Oral Q breakfast  . protein supplement shake  11 oz Oral Q24H  . rosuvastatin  20 mg Oral Daily   Continuous Infusions: . cefTRIAXone (ROCEPHIN)  IV Stopped (05/17/17 0506)  . heparin 1,100 Units/hr (05/16/17 2134)    Assessment/Plan:  1. Acute on chronic diastolic congestive heart failure.  Lungs sound better today switch Lasix over to oral. 2. Elevated troponin.  Cardiology to do a cardiac catheterization tomorrow.  Continue aspirin, Coreg and Crestor.  Check lipid profile. 3. COPD exacerbation.  Continue prednisone taper and nebulizer treatments empiric antibiotics.  Questionable pneumonia on initial chest x-ray.   4. Essential hypertension continue usual medications 5. Hyperlipidemia unspecified on Crestor  Code Status:     Code Status Orders  (From admission, onward)        Start     Ordered   05/16/17 1342  Full code  Continuous     05/16/17 1341    Code Status History    This patient has a current code status but no historical code status.     Disposition Plan:  depending on cardiac catheterization results tomorrow may be able to go home tomorrow evening versus Tuesday morning.  Is   Consultants:  Cardiology  Antibiotics:  Rocephin  Zithromax  Time spent: 28 minutes  Kajah Santizo [employer]  [employer] [facility]

## 2017-05-17 NOTE — Progress Notes (Signed)
Initial Nutrition Assessment  DOCUMENTATION CODES:   Obesity unspecified  INTERVENTION:  Provide Premier Protein po once daily, each supplement provides 160 kcal and 30 grams of protein.  Provide daily MVI as patient has limited variability in diet and no intake of fruits and vegetables.  Encouraged patient to choose a source of protein at each meal. Discussed items available on the menu that contain protein.  RD will follow-up with appropriate education pending findings in cardiac catheterization.  NUTRITION DIAGNOSIS:   Inadequate oral intake related to decreased appetite as evidenced by per patient/family report.  GOAL:   Patient will meet greater than or equal to 90% of their needs  MONITOR:   PO intake, Supplement acceptance, Labs, Weight trends, I & O's  REASON FOR ASSESSMENT:   Malnutrition Screening Tool    ASSESSMENT:   67 year old female with PMHx of asthma, arthritis, HTN, nephrolithiasis, CKD, HLD, COPD, atrophic left kidney who presented with SOB and weakness found to have pleural effusions, possible NSTEMI, possible PNA with plan for cardiac catheterization on 4/15.   Met with patient at bedside. She reports she had a decreased appetite for about one month PTA. She had bronchitis and then some type of a virus that caused diarrhea and nausea. During that time her intake was variable. She would eat anywhere from 1-2 meals per day or occasionally would skip meals. She reports her appetite is improved here. She ate all of her breakfast today (pancakes, grits). She typically eats 2 meals per day and snacks between meals. She does not cook for herself at home anymore. She goes out to eat at [employer], [employer], [employer] or another fast food restaurant. She eats fried foods at almost every meal. She reports she does not like fruits or vegetables, so she does not eat any and does not even drink fruit juice.  Patient reports her UBW is around 230 lbs and she was  last that weight about one month ago. She was 233.2 lbs on 12/01/2016 and was 226.4 lbs on admission. Weight has now decreased to 218.8 lbs with diuresis and possibly from use of a different scale.  Meal Completion: 100% of breakfast today  Medications reviewed and include: azithromycin, famotidine, Lasix 40 mg daily PO, prednisone 30 mg daily, ceftriaxone.  Labs reviewed: Potassium 3.3, BUN 22, Creatinine 1.17.  I/O: 1000 mL UOP yesterday + one unmeasured occurrence of UOP  Patient does not meet criteria for malnutrition.  Discussed with RN.  NUTRITION - FOCUSED PHYSICAL EXAM:    Most Recent Value  Orbital Region  No depletion  Upper Arm Region  No depletion  Thoracic and Lumbar Region  No depletion  Buccal Region  No depletion  Temple Region  Mild depletion  Clavicle Bone Region  No depletion  Clavicle and Acromion Bone Region  No depletion  Scapular Bone Region  No depletion  Dorsal Hand  No depletion  Patellar Region  No depletion  Anterior Thigh Region  No depletion  Posterior Calf Region  No depletion  Edema (RD Assessment)  Mild  Hair  Reviewed  Eyes  Reviewed  Mouth  Reviewed [poor dentition]  Skin  Reviewed  Nails  Reviewed     Diet Order:  Diet Heart Room service appropriate? Yes; Fluid consistency: Thin  EDUCATION NEEDS:   Not appropriate for education at this time(Will follow-up with education pending findings in cardiac catheterization)  Skin:  Skin Assessment: Reviewed RN Assessment  Last BM:  PTA (05/15/2017 per chart)  Height:  Ht Readings from Last 1 Encounters:  05/16/17 5' 9"  (1.753 m)    Weight:   Wt Readings from Last 1 Encounters:  05/17/17 218 lb 12.8 oz (99.2 kg)    Ideal Body Weight:  65.9 kg  BMI:  Body mass index is 32.31 kg/m.  Estimated Nutritional Needs:   Kcal:  6840-3353 (MSJ x 1.2-1.3)  Protein:  90-110 grams (0.9-1.1 grams/kg)  Fluid:  1.6 L/day (25 mL/kg IBW) or per MD  Willey Blade, MS, RD, LDN Office:  [phone] Pager: [phone] After Hours/Weekend Pager: [phone]

## 2017-05-18 ENCOUNTER — Encounter: Payer: Self-pay | Admitting: Emergency Medicine

## 2017-05-18 ENCOUNTER — Encounter
Admission: EM | Disposition: A | Discharge status: Other Acute Inpatient Hospital | Payer: Self-pay | Source: Home / Self Care | Attending: Internal Medicine

## 2017-05-18 DIAGNOSIS — I214 Non-ST elevation (NSTEMI) myocardial infarction: Secondary | ICD-10-CM

## 2017-05-18 HISTORY — PX: LEFT HEART CATH AND CORONARY ANGIOGRAPHY: CATH118249

## 2017-05-18 LAB — BASIC METABOLIC PANEL WITH GFR
Anion gap: 9 (ref 5–15)
BUN: 30 mg/dL — ABNORMAL HIGH (ref 6–20)
CO2: 24 mmol/L (ref 22–32)
Calcium: 8 mg/dL — ABNORMAL LOW (ref 8.9–10.3)
Chloride: 107 mmol/L (ref 101–111)
Creatinine, Ser: 1.17 mg/dL — ABNORMAL HIGH (ref 0.44–1.00)
GFR calc Af Amer: 55 mL/min — ABNORMAL LOW
GFR calc non Af Amer: 47 mL/min — ABNORMAL LOW
Glucose, Bld: 93 mg/dL (ref 65–99)
Potassium: 2.9 mmol/L — ABNORMAL LOW (ref 3.5–5.1)
Sodium: 140 mmol/L (ref 135–145)

## 2017-05-18 LAB — LEGIONELLA PNEUMOPHILA SEROGP 1 UR AG: L. pneumophila Serogp 1 Ur Ag: NEGATIVE

## 2017-05-18 LAB — GLUCOSE, CAPILLARY: Glucose-Capillary: 103 mg/dL — ABNORMAL HIGH (ref 65–99)

## 2017-05-18 LAB — CBC
HCT: 35.5 % (ref 35.0–47.0)
Hemoglobin: 12.6 g/dL (ref 12.0–16.0)
MCH: 32.5 pg (ref 26.0–34.0)
MCHC: 35.5 g/dL (ref 32.0–36.0)
MCV: 91.4 fL (ref 80.0–100.0)
Platelets: 192 K/uL (ref 150–440)
RBC: 3.88 MIL/uL (ref 3.80–5.20)
RDW: 14.6 % — ABNORMAL HIGH (ref 11.5–14.5)
WBC: 11.8 K/uL — ABNORMAL HIGH (ref 3.6–11.0)

## 2017-05-18 LAB — PROTIME-INR
INR: 1.03
Prothrombin Time: 13.4 s (ref 11.4–15.2)

## 2017-05-18 LAB — HEPARIN LEVEL (UNFRACTIONATED)
Heparin Unfractionated: 0.28 [IU]/mL — ABNORMAL LOW (ref 0.30–0.70)
Heparin Unfractionated: 0.62 [IU]/mL (ref 0.30–0.70)

## 2017-05-18 SURGERY — LEFT HEART CATH AND CORONARY ANGIOGRAPHY
Anesthesia: Moderate Sedation

## 2017-05-18 MED ORDER — SODIUM CHLORIDE 0.9% FLUSH
3.0000 mL | INTRAVENOUS | Status: DC | PRN
Start: 2017-05-18 — End: 2017-05-19

## 2017-05-18 MED ORDER — FUROSEMIDE 20 MG PO TABS
20.0000 mg | ORAL_TABLET | Freq: Every day | ORAL | Status: DC
  Filled 2017-05-18: qty 1

## 2017-05-18 MED ORDER — SODIUM CHLORIDE 0.9 % WEIGHT BASED INFUSION
3.0000 mL/kg/h | INTRAVENOUS | Status: DC
  Administered 2017-05-18: 3 mL/kg/h via INTRAVENOUS

## 2017-05-18 MED ORDER — SODIUM CHLORIDE 0.9% FLUSH: 3.0000 mL | Freq: Two times a day (BID) | INTRAVENOUS | Status: DC

## 2017-05-18 MED ORDER — SODIUM CHLORIDE 0.9 % WEIGHT BASED INFUSION: 1.0000 mL/kg/h | INTRAVENOUS | Status: DC

## 2017-05-18 MED ORDER — SODIUM CHLORIDE 0.9 % IV SOLN
250.0000 mL | INTRAVENOUS | Status: DC | PRN
Start: 2017-05-18 — End: 2017-05-19

## 2017-05-18 MED ORDER — AZITHROMYCIN 250 MG PO TABS: ORAL_TABLET | ORAL | 0 refills | Status: DC

## 2017-05-18 MED ORDER — HEPARIN (PORCINE) IN NACL 2-0.9 UNIT/ML-% IJ SOLN
INTRAMUSCULAR | Status: AC
  Filled 2017-05-18: qty 1000

## 2017-05-18 MED ORDER — HEPARIN (PORCINE) IN NACL 100-0.45 UNIT/ML-% IJ SOLN: 1250.0000 [IU]/h | INTRAMUSCULAR | Status: DC

## 2017-05-18 MED ORDER — POTASSIUM CHLORIDE CRYS ER 20 MEQ PO TBCR: 40.0000 meq | EXTENDED_RELEASE_TABLET | Freq: Once | ORAL | Status: DC

## 2017-05-18 MED ORDER — POTASSIUM CHLORIDE 10 MEQ/100ML IV SOLN
10.0000 meq | INTRAVENOUS | Status: AC
  Administered 2017-05-18 (×2): 10 meq via INTRAVENOUS
  Filled 2017-05-18 (×2): qty 100

## 2017-05-18 MED ORDER — ASPIRIN 81 MG PO CHEW
CHEWABLE_TABLET | ORAL | Status: AC
Start: 2017-05-18 — End: 2017-05-18
  Filled 2017-05-18: qty 1

## 2017-05-18 MED ORDER — BUDESONIDE-FORMOTEROL FUMARATE 80-4.5 MCG/ACT IN AERO: 2.0000 | INHALATION_SPRAY | Freq: Two times a day (BID) | RESPIRATORY_TRACT | 12 refills | Status: DC

## 2017-05-18 MED ORDER — NITROGLYCERIN 0.4 MG SL SUBL: 0.4000 mg | SUBLINGUAL_TABLET | SUBLINGUAL | 12 refills | Status: DC | PRN

## 2017-05-18 MED ORDER — FENTANYL CITRATE (PF) 100 MCG/2ML IJ SOLN
INTRAMUSCULAR | Status: AC
  Filled 2017-05-18: qty 2

## 2017-05-18 MED ORDER — ACETAMINOPHEN 325 MG PO TABS: 650.0000 mg | ORAL_TABLET | ORAL | Status: DC | PRN

## 2017-05-18 MED ORDER — HEPARIN BOLUS VIA INFUSION
1300.0000 [IU] | Freq: Once | INTRAVENOUS | Status: AC
  Administered 2017-05-18: 1300 [IU] via INTRAVENOUS
  Filled 2017-05-18: qty 1300

## 2017-05-18 MED ORDER — FENTANYL CITRATE (PF) 100 MCG/2ML IJ SOLN
INTRAMUSCULAR | Status: DC | PRN
  Administered 2017-05-18: 50 ug via INTRAVENOUS

## 2017-05-18 MED ORDER — POTASSIUM CHLORIDE CRYS ER 20 MEQ PO TBCR
40.0000 meq | EXTENDED_RELEASE_TABLET | ORAL | Status: AC
  Administered 2017-05-18: 40 meq via ORAL
  Filled 2017-05-18: qty 2

## 2017-05-18 MED ORDER — IOPAMIDOL (ISOVUE-300) INJECTION 61%
INTRAVENOUS | Status: DC | PRN
  Administered 2017-05-18: 120 mL via INTRA_ARTERIAL

## 2017-05-18 MED ORDER — ONDANSETRON HCL 4 MG/2ML IJ SOLN: 4.0000 mg | Freq: Four times a day (QID) | INTRAMUSCULAR | Status: DC | PRN

## 2017-05-18 MED ORDER — HEPARIN (PORCINE) IN NACL 100-0.45 UNIT/ML-% IJ SOLN
1450.0000 [IU]/h | INTRAMUSCULAR | Status: DC
  Administered 2017-05-18: 1250 [IU]/h via INTRAVENOUS
  Filled 2017-05-18: qty 250

## 2017-05-18 MED ORDER — ASPIRIN 81 MG PO CHEW
81.0000 mg | CHEWABLE_TABLET | ORAL | Status: AC
  Administered 2017-05-18: 81 mg via ORAL

## 2017-05-18 MED ORDER — SODIUM CHLORIDE 0.9% FLUSH
3.0000 mL | Freq: Two times a day (BID) | INTRAVENOUS | Status: DC
  Administered 2017-05-18: 3 mL via INTRAVENOUS

## 2017-05-18 MED ORDER — MIDAZOLAM HCL 2 MG/2ML IJ SOLN
INTRAMUSCULAR | Status: DC | PRN
  Administered 2017-05-18: 1 mg via INTRAVENOUS

## 2017-05-18 MED ORDER — FUROSEMIDE 20 MG PO TABS: 20.0000 mg | ORAL_TABLET | Freq: Every day | ORAL | Status: DC

## 2017-05-18 MED ORDER — PREMIER PROTEIN SHAKE: 11.0000 [oz_av] | ORAL | 0 refills | Status: DC

## 2017-05-18 MED ORDER — SODIUM CHLORIDE 0.9% FLUSH: 3.0000 mL | INTRAVENOUS | Status: DC | PRN

## 2017-05-18 MED ORDER — SODIUM CHLORIDE 0.9 % IV SOLN: 250.0000 mL | INTRAVENOUS | Status: DC | PRN

## 2017-05-18 MED ORDER — PREDNISONE 10 MG PO TABS: ORAL_TABLET | ORAL | 0 refills | Status: DC

## 2017-05-18 MED ORDER — SODIUM CHLORIDE 0.9 % WEIGHT BASED INFUSION: 1.0000 mL/kg/h | INTRAVENOUS | Status: AC

## 2017-05-18 MED ORDER — MIDAZOLAM HCL 2 MG/2ML IJ SOLN
INTRAMUSCULAR | Status: AC
  Filled 2017-05-18: qty 2

## 2017-05-18 SURGICAL SUPPLY — 11 items
CATH INFINITI 5 FR JL3.5 (CATHETERS) ×2 IMPLANT
CATH INFINITI 5FR ANG PIGTAIL (CATHETERS) ×2 IMPLANT
CATH INFINITI 5FR JL4 (CATHETERS) ×2 IMPLANT
CATH INFINITI 5FR JL5 (CATHETERS) ×2 IMPLANT
CATH INFINITI JR4 5F (CATHETERS) ×2 IMPLANT
DEVICE CLOSURE MYNXGRIP 5F (Vascular Products) ×2 IMPLANT
KIT MANI 3VAL PERCEP (MISCELLANEOUS) ×2 IMPLANT
NEEDLE PERC 18GX7CM (NEEDLE) ×2 IMPLANT
PACK CARDIAC CATH (CUSTOM PROCEDURE TRAY) ×2 IMPLANT
SHEATH PINNACLE 5F 10CM (SHEATH) ×2 IMPLANT
WIRE GUIDERIGHT .035X150 (WIRE) ×2 IMPLANT

## 2017-05-18 NOTE — Discharge Summary (Addendum)
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 Arthritis   . Asthma   . Chronic kidney disease   . Hypertension   . Nephrolithiasis     HOSPITAL COURSE:   1.  NSTEMI.  Patient was admitted with shortness of breath and she had a borderline troponin.  First troponin 0.46.  Second troponin 0.54.  Third troponin 0.58.  The patient was started on heparin drip on presentation.  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  Dr. Park BreedKahn recommended CABG and he will make contact with tertiary care center for transfer.  Patient on aspirin, Coreg and Crestor. 2.  Acute on chronic diastolic congestive heart failure with pleural effusions.  The patient was initially diuresed with IV Lasix and then switched over to oral Lasix.  Lungs sounded much better upon disposition. 3.  COPD exacerbation.  Quick prednisone taper and empiric Zithromax.  Initial chest x-ray more likely fluid rather than pneumonia. 4.  Essential hypertension continue usual medications  5.  Hyperlipidemia unspecified on Crestor 6.   Hypokalemia potassium replaced orally and via IV today.  Recheck tomorrow.  Lasix held today.  DISCHARGE CONDITIONS:   Satisfactory  CONSULTS OBTAINED:  Treatment Team:  Barbaraann RondoSridharan, Prasanna, MD Laurier NancyKhan, Shaukat A, MD  DRUG ALLERGIES:   Allergies  Allergen Reactions  . Lisinopril Cough  . Ace Inhibitors Other (See Comments)    Has been told to avoid these and any diuretics because "left kidney has shrunk up and doesn't work"    DISCHARGE MEDICATIONS:   Allergies as of 05/19/2017      Reactions   Lisinopril Cough   Ace Inhibitors Other (See Comments)   Has been told to avoid these and any diuretics because "left kidney has shrunk up and doesn't work"      Medication List    TAKE these medications   acetaminophen 500 MG tablet Commonly known as:  TYLENOL Take 500 mg by mouth daily.   albuterol 108 (90 Base) MCG/ACT inhaler Commonly known as:  PROVENTIL HFA;VENTOLIN HFA Inhale 2 puffs into the lungs every 6 (six) hours as needed for wheezing or shortness of breath.   amLODipine 10 MG tablet Commonly known as:  NORVASC Take 10 mg by mouth daily.   ASPIRIN 81 PO Take 81 mg by mouth.   azithromycin 250 MG tablet Commonly known as:  ZITHROMAX One tab daily for two more days   budesonide-formoterol 80-4.5 MCG/ACT inhaler Commonly known as:  SYMBICORT Inhale 2 puffs into the lungs 2 (two) times daily.   carvedilol 3.125 MG tablet Commonly known as:  COREG Take 1 tablet (3.125 mg  total) by mouth 2 (two) times daily with a meal.   cloNIDine 0.3 mg/24hr patch Commonly known as:  CATAPRES - Dosed in mg/24 hr Place 1 patch (0.3 mg total) onto the skin once a week.   furosemide 20 MG tablet Commonly known as:  LASIX Take 1 tablet (20 mg total) by mouth daily.   heparin 100-0.45 UNIT/ML-% infusion Inject 1,250 Units/hr into the vein continuous.   hydrALAZINE 100 MG tablet Commonly known as:  APRESOLINE Take 1 tablet (100 mg total) by mouth 3 (three) times daily.    loratadine 10 MG tablet Commonly known as:  CLARITIN Take 10 mg by mouth daily.   losartan 25 MG tablet Commonly known as:  COZAAR Take 1 tablet (25 mg total) by mouth daily.   nitroGLYCERIN 0.4 MG SL tablet Commonly known as:  NITROSTAT Place 1 tablet (0.4 mg total) under the tongue every 5 (five) minutes x 3 doses as needed for chest pain.   predniSONE 10 MG tablet Commonly known as:  DELTASONE Three tabs po day1; 2 tabs po day2; 1 tab po day3 then stop   rosuvastatin 20 MG tablet Commonly known as:  CRESTOR Take 1 tablet (20 mg total) by mouth daily.   ZANTAC PO Take 150 mg by mouth daily.        DISCHARGE INSTRUCTIONS:   Follow-up with cardiothoracic team at tertiary care center  If you experience worsening of your admission symptoms, develop shortness of breath, life threatening emergency, suicidal or homicidal thoughts you must seek medical attention immediately by calling 911 or calling your MD immediately  if symptoms less severe.  You Must read complete instructions/literature along with all the possible adverse reactions/side effects for all the Medicines you take and that have been prescribed to you. Take any new Medicines after you have completely understood and accept all the possible adverse reactions/side effects.   Please note  You were cared for by a hospitalist during your hospital stay. If you have any questions about your discharge medications or the care you received while you were in the hospital after you are discharged, you can call the unit and asked to speak with the hospitalist on call if the hospitalist that took care of you is not available. Once you are discharged, your primary care physician will handle any further medical issues. Please note that NO REFILLS for any discharge medications will be authorized once you are discharged, as it is imperative that you return to your primary care physician (or establish a relationship with a primary care  physician if you do not have one) for your aftercare needs so that they can reassess your need for medications and monitor your lab values.    Today   CHIEF COMPLAINT:   Chief Complaint  Patient presents with  . Shortness of Breath    HISTORY OF PRESENT ILLNESS:  Shoshanna Mcquitty  is a 67 y.o. female presented with shortness of breath   VITAL SIGNS:  Blood pressure (!) 153/79, pulse 87, temperature 98.2 F (36.8 C), temperature source Oral, resp. rate 16, height 5\' 9"  (1.753 m), weight 100.4 kg (221 lb 4.8 oz), SpO2 100 %.   PHYSICAL EXAMINATION:  GENERAL:  67 y.o.-year-old patient lying in the bed with no acute distress.  EYES: Pupils equal, round, reactive to light and accommodation. No scleral icterus. Extraocular muscles intact.  HEENT: Head atraumatic, normocephalic. Oropharynx and nasopharynx clear.  NECK:  Supple, no jugular venous distention. No thyroid enlargement, no tenderness.  LUNGS:  decreased  breath sounds bilateral  bases, no wheezing, rales,rhonchi or crepitation. No use of accessory muscles of respiration.  CARDIOVASCULAR: S1, S2 normal. No murmurs, rubs, or gallops.  ABDOMEN: Soft, non-tender, non-distended. Bowel sounds present. No organomegaly or mass.  EXTREMITIES: Trace edema, no cyanosis, or clubbing.  NEUROLOGIC: Cranial nerves II through XII are intact. Muscle strength 5/5 in all extremities. Sensation intact. Gait not checked.  PSYCHIATRIC: The patient is alert and oriented x 3.  SKIN: No obvious rash, lesion, or ulcer.   DATA REVIEW:   CBC Recent Labs  Lab 05/19/17 0327  WBC 9.3  HGB 12.7  HCT 37.1  PLT 210    Chemistries  Recent Labs  Lab 05/16/17 1018  05/18/17 0456  NA  --    < > 140  K  --    < > 2.9*  CL  --    < > 107  CO2  --    < > 24  GLUCOSE  --    < > 93  BUN  --    < > 30*  CREATININE  --    < > 1.17*  CALCIUM  --    < > 8.0*  MG 1.7  --   --    < > = values in this interval not displayed.    Cardiac  Enzymes Recent Labs  Lab 05/16/17 1344  TROPONINI 0.58*    Microbiology Results  Results for orders placed or performed during the hospital encounter of 05/16/17  Blood culture (routine x 2)     Status: None (Preliminary result)   Collection Time: 05/16/17  4:27 AM  Result Value Ref Range Status   Specimen Description BLOOD RIGHT HAND  Final   Special Requests   Final    BOTTLES DRAWN AEROBIC AND ANAEROBIC Blood Culture adequate volume   Culture   Final    NO GROWTH 3 DAYS Performed at [facility], [address], [place], [place] [address]    Report Status PENDING  Incomplete  Blood culture (routine x 2)     Status: None (Preliminary result)   Collection Time: 05/16/17  4:32 AM  Result Value Ref Range Status   Specimen Description BLOOD BLOOD LEFT WRIST  Final   Special Requests   Final    BOTTLES DRAWN AEROBIC AND ANAEROBIC Blood Culture adequate volume   Culture   Final    NO GROWTH 3 DAYS Performed at [facility], [address], [place], [place] [address]    Report Status PENDING  Incomplete     Management plans discussed with the patient, family and they are in agreement.  CODE STATUS:     Code Status Orders  (From admission, onward)        Start     Ordered   05/16/17 1342  Full code  Continuous     05/16/17 1341    Code Status History    This patient has a current code status but no historical code status.      TOTAL TIME TAKING CARE OF THIS PATIENT: 35 minutes.    Alford Highland M.D on 05/19/2017 at 9:04 AM  Between 7am to 6pm - Pager - [phone]  After 6pm go to [email or web address] - password [email or web address]  [employer] [facility] Office  [phone]  CC: Primary care physician; Glori Luis, MD

## 2017-05-18 NOTE — Progress Notes (Signed)
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 Normal LVEF. [facility].

## 2017-05-18 NOTE — Consult Note (Signed)
ANTICOAGULATION CONSULT NOTE - Initial Consult  Pharmacy Consult for heparin drip Indication: chest pain/ACS- pt transferring to [facility] for CABG  Allergies  Allergen Reactions  . Lisinopril Cough  . Ace Inhibitors Other (See Comments)    Has been told to avoid these and any diuretics because "left kidney has shrunk up and doesn't work"    Patient Measurements: Height: 5\' 9"  (175.3 cm) Weight: 219 lb 4.8 oz (99.5 kg) IBW/kg (Calculated) : 66.2 Heparin Dosing Weight: 88.7kg  Vital Signs: Temp: 97.7 F (36.5 C) (04/15 0755) Temp Source: Oral (04/15 0755) BP: 149/77 (04/15 1355) Pulse Rate: 86 (04/15 1355)  Labs: Recent Labs    05/16/17 0308  05/16/17 0319 05/16/17 0439 05/16/17 0732 05/16/17 1018 05/16/17 1344  05/17/17 0519 05/18/17 0456 05/18/17 1042  HGB  --    < > 13.2  --   --   --   --   --  13.6 12.6  --   HCT  --   --  38.0  --   --   --   --   --  39.7 35.5  --   PLT  --   --  211  --   --   --   --   --  250 192  --   APTT  --   --   --  31  --   --   --   --   --   --   --   LABPROT  --   --   --  13.1  --   --   --   --   --  13.4  --   INR  --   --   --  1.00  --   --   --   --   --  1.03  --   HEPARINUNFRC  --   --   --   --   --   --  0.47   < > 0.43 0.28* 0.62  CREATININE 1.11*  --   --   --   --   --   --   --  1.17* 1.17*  --   TROPONINI 0.41*  --   --   --  0.46* 0.54* 0.58*  --   --   --   --    < > = values in this interval not displayed.    Estimated Creatinine Clearance: 58.6 mL/min (A) (by C-G formula based on SCr of 1.17 mg/dL (H)).   Medical History: Past Medical History:  Diagnosis Date  . Arthritis   . Asthma   . Chronic kidney disease   . Hypertension   . Nephrolithiasis     Medications:  Scheduled:  . acetaminophen  500 mg Oral Daily  . amLODipine  10 mg Oral Daily  . aspirin EC  81 mg Oral Daily  . azithromycin  250 mg Oral Daily  . budesonide (PULMICORT) nebulizer solution  0.5 mg Nebulization BID  . carvedilol   3.125 mg Oral BID WC  . cloNIDine  0.3 mg Transdermal Weekly  . famotidine  10 mg Oral Daily  . [START ON 05/19/2017] furosemide  20 mg Oral Daily  . hydrALAZINE  100 mg Oral TID  . ipratropium-albuterol  3 mL Nebulization Q6H  . loratadine  10 mg Oral Daily  . losartan  25 mg Oral Daily  . multivitamin with minerals  1 tablet Oral Daily  . potassium chloride  40 mEq  Oral Once  . predniSONE  30 mg Oral Q breakfast  . protein supplement shake  11 oz Oral Q24H  . rosuvastatin  20 mg Oral Daily  . 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 It was recommened that patient transfer for CABG. Pharmacy consulted to restart heparin drip 8 hours after sheath removal (1255). Pt was therapeutic at 1250 units/hr. Will restart at 2100 (8 hr after sheath removal)  Goal of Therapy:  Heparin level 0.3-0.7 units/ml Monitor platelets by anticoagulation protocol: Yes   Plan:  Start heparin infusion at 1250 units/hr Check anti-Xa level in 6 hours and daily while on heparin Continue to monitor H&H and platelets  Jaquan Sadowsky D Jaryiah Mehlman, Pharm.D, BCPS Clinical Pharmacist  05/18/2017,2:13 PM

## 2017-05-18 NOTE — Progress Notes (Signed)
ANTICOAGULATION CONSULT NOTE - Consult  Pharmacy Consult for heparin Indication: chest pain/ACS  Allergies  Allergen Reactions  . Ace Inhibitors Other (See Comments)    Has been told to avoid these and any diuretics because "left kidney has shrunk up and doesn't work"    Patient Measurements: Height: 5\' 9"  (175.3 cm) Weight: 219 lb 4.8 oz (99.5 kg) IBW/kg (Calculated) : 66.2 Heparin Dosing Weight: 98.4 kg  Vital Signs: Temp: 97.8 F (36.6 C) (04/15 0345) Temp Source: Oral (04/15 0345) BP: 132/65 (04/15 0345) Pulse Rate: 83 (04/15 0345)  Labs: Recent Labs    05/16/17 0308  05/16/17 0319 05/16/17 0439 05/16/17 0732 05/16/17 1018  05/16/17 1344 05/16/17 2223 05/17/17 0519 05/18/17 0456  HGB  --    < > 13.2  --   --   --   --   --   --  13.6 12.6  HCT  --   --  38.0  --   --   --   --   --   --  39.7 35.5  PLT  --   --  211  --   --   --   --   --   --  250 192  APTT  --   --   --  31  --   --   --   --   --   --   --   LABPROT  --   --   --  13.1  --   --   --   --   --   --   --   INR  --   --   --  1.00  --   --   --   --   --   --   --   HEPARINUNFRC  --   --   --   --   --   --    < > 0.47 0.40 0.43 0.28*  CREATININE 1.11*  --   --   --   --   --   --   --   --  1.17* 1.17*  TROPONINI 0.41*  --   --   --  0.46* 0.54*  --  0.58*  --   --   --    < > = values in this interval not displayed.    Estimated Creatinine Clearance: 58.6 mL/min (A) (by C-G formula based on SCr of 1.17 mg/dL (H)).   Medical History: Past Medical History:  Diagnosis Date  . Arthritis   . Asthma   . Chronic kidney disease   . Hypertension   . Nephrolithiasis     Medications:  Scheduled:  . acetaminophen  500 mg Oral Daily  . amLODipine  10 mg Oral Daily  . aspirin EC  81 mg Oral Daily  . azithromycin  250 mg Oral Daily  . budesonide (PULMICORT) nebulizer solution  0.5 mg Nebulization BID  . carvedilol  3.125 mg Oral BID WC  . cloNIDine  0.3 mg Transdermal Weekly  . famotidine   10 mg Oral Daily  . furosemide  40 mg Oral Daily  . heparin  1,300 Units Intravenous Once  . hydrALAZINE  100 mg Oral TID  . ipratropium-albuterol  3 mL Nebulization Q6H  . loratadine  10 mg Oral Daily  . losartan  25 mg Oral Daily  . multivitamin with minerals  1 tablet Oral Daily  . predniSONE  30 mg Oral Q breakfast  .  protein supplement shake  11 oz Oral Q24H  . rosuvastatin  20 mg Oral Daily  . sodium chloride flush  3 mL Intravenous Q12H    Assessment: Patient admitted for SOB w/ diagnosed bronchitis and is on amoxicillin PTA.  Tn found to be 0.41, EKG still pending, heparin drip is being started and patient not on any PTA anticoagulation.  Goal of Therapy:  Heparin level 0.3-0.7 units/ml Monitor platelets by anticoagulation protocol: Yes   Plan:  Heparin level therapeutic; will continue current heparin drip rate and check next HL (anti-Xa) in 6 hours.  Will monitor daily CBC's and adjust per anti-Xa levels.  04/14 @ 0500 HL 0.43 therapeutic. Will continue current rate and will recheck w/ am labs. CBC stable.  04/15 @ 0500 HL 0.28 subtherapeutic. Will rebolus w/ heparin 1300 units IV x 1 and will increase rate to 1250 units/hr and will recheck anti-Xa @ 1100.  Thomasene Ripple, PharmD, BCPS Clinical Pharmacist 05/18/2017

## 2017-05-18 NOTE — Progress Notes (Signed)
SUBJECTIVE: Feeling well. Denies chest pain or shortness of breath.    Vitals:   05/17/17 1802 05/17/17 2049 05/18/17 0345 05/18/17 0755  BP: (!) 145/68 (!) 122/56 132/65 130/73  Pulse: 89 79 83 84  Resp:   18 18  Temp:  98.2 F (36.8 C) 97.8 F (36.6 C) 97.7 F (36.5 C)  TempSrc:  Oral Oral Oral  SpO2:  100% 95% 96%  Weight:   219 lb 4.8 oz (99.5 kg)   Height:        Intake/Output Summary (Last 24 hours) at 05/18/2017 0909 Last data filed at 05/18/2017 0353 Gross per 24 hour  Intake 1149.38 ml  Output 1100 ml  Net 49.38 ml    LABS: Basic Metabolic Panel: Recent Labs    05/16/17 1018 05/17/17 0519 05/18/17 0456  NA  --  140 140  K  --  3.3* 2.9*  CL  --  107 107  CO2  --  23 24  GLUCOSE  --  123* 93  BUN  --  22* 30*  CREATININE  --  1.17* 1.17*  CALCIUM  --  8.6* 8.0*  MG 1.7  --   --    Liver Function Tests: No results for input(s): AST, ALT, ALKPHOS, BILITOT, PROT, ALBUMIN in the last 72 hours. No results for input(s): LIPASE, AMYLASE in the last 72 hours. CBC: Recent Labs    05/16/17 0319 05/17/17 0519 05/18/17 0456  WBC 13.3* 13.2* 11.8*  NEUTROABS 11.0*  --   --   HGB 13.2 13.6 12.6  HCT 38.0 39.7 35.5  MCV 89.1 90.7 91.4  PLT 211 250 192   Cardiac Enzymes: Recent Labs    05/16/17 0732 05/16/17 1018 05/16/17 1344  TROPONINI 0.46* 0.54* 0.58*   BNP: Invalid input(s): POCBNP D-Dimer: No results for input(s): DDIMER in the last 72 hours. Hemoglobin A1C: No results for input(s): HGBA1C in the last 72 hours. Fasting Lipid Panel: Recent Labs    05/17/17 0519  CHOL 99  HDL 39*  LDLCALC 46  TRIG 72  CHOLHDL 2.5   Thyroid Function Tests: No results for input(s): TSH, T4TOTAL, T3FREE, THYROIDAB in the last 72 hours.  Invalid input(s): FREET3 Anemia Panel: No results for input(s): VITAMINB12, FOLATE, FERRITIN, TIBC, IRON, RETICCTPCT in the last 72 hours.   PHYSICAL EXAM General: Well developed, well nourished, in no acute  distress HEENT:  Normocephalic and atramatic Neck:  No JVD.  Lungs: Clear bilaterally to auscultation and percussion. Heart: HRRR . Normal S1 and S2 without gallops or murmurs.  Abdomen: Bowel sounds are positive, abdomen soft and non-tender  Msk:  Back normal, normal gait. Normal strength and tone for age. Extremities: No clubbing, cyanosis or edema.   Neuro: Alert and oriented X 3. Psych:  Good affect, responds appropriately  TELEMETRY: NSR 85bpm  ASSESSMENT AND PLAN: Elevated troponin with anterolateral ischemia on EKG and symptoms of heart failure and borderline LVEF. Will proceed with cardiac cath later today.  Active Problems:   SOB (shortness of breath)   Pleural effusion   Elevated troponin I level   NSTEMI (non-ST elevated myocardial infarction) (HCC)   CHF (congestive heart failure) (HCC)    Caroleen HammanKristin Auther Lyerly, NP-C 05/18/2017 9:09 AM Cell: [phone]

## 2017-05-19 ENCOUNTER — Other Ambulatory Visit: Payer: Self-pay | Admitting: *Deleted

## 2017-05-19 ENCOUNTER — Other Ambulatory Visit: Payer: Self-pay

## 2017-05-19 ENCOUNTER — Inpatient Hospital Stay (HOSPITAL_COMMUNITY)
Admission: AD | Admit: 2017-05-19 | Discharge: 2017-05-27 | DRG: 235 | Disposition: A | Discharge status: Home / Self Care | Payer: BLUE CROSS/BLUE SHIELD | Source: Other Acute Inpatient Hospital | Attending: Cardiothoracic Surgery | Admitting: Cardiothoracic Surgery

## 2017-05-19 ENCOUNTER — Inpatient Hospital Stay (HOSPITAL_COMMUNITY): Payer: BLUE CROSS/BLUE SHIELD

## 2017-05-19 ENCOUNTER — Encounter (HOSPITAL_COMMUNITY): Payer: Self-pay | Admitting: General Practice

## 2017-05-19 DIAGNOSIS — J90 Pleural effusion, not elsewhere classified: Secondary | ICD-10-CM

## 2017-05-19 DIAGNOSIS — Z8249 Family history of ischemic heart disease and other diseases of the circulatory system: Secondary | ICD-10-CM | POA: Diagnosis not present

## 2017-05-19 DIAGNOSIS — E669 Obesity, unspecified: Secondary | ICD-10-CM | POA: Diagnosis present

## 2017-05-19 DIAGNOSIS — Z951 Presence of aortocoronary bypass graft: Secondary | ICD-10-CM

## 2017-05-19 DIAGNOSIS — E785 Hyperlipidemia, unspecified: Secondary | ICD-10-CM | POA: Diagnosis present

## 2017-05-19 DIAGNOSIS — N183 Chronic kidney disease, stage 3 (moderate): Secondary | ICD-10-CM | POA: Diagnosis present

## 2017-05-19 DIAGNOSIS — I10 Essential (primary) hypertension: Secondary | ICD-10-CM

## 2017-05-19 DIAGNOSIS — K0530 Chronic periodontitis, unspecified: Secondary | ICD-10-CM | POA: Diagnosis not present

## 2017-05-19 DIAGNOSIS — I5043 Acute on chronic combined systolic (congestive) and diastolic (congestive) heart failure: Secondary | ICD-10-CM | POA: Diagnosis not present

## 2017-05-19 DIAGNOSIS — I25110 Atherosclerotic heart disease of native coronary artery with unstable angina pectoris: Secondary | ICD-10-CM | POA: Diagnosis not present

## 2017-05-19 DIAGNOSIS — D62 Acute posthemorrhagic anemia: Secondary | ICD-10-CM | POA: Diagnosis not present

## 2017-05-19 DIAGNOSIS — Z6831 Body mass index (BMI) 31.0-31.9, adult: Secondary | ICD-10-CM | POA: Diagnosis not present

## 2017-05-19 DIAGNOSIS — K06010 Localized gingival recession, unspecified: Secondary | ICD-10-CM | POA: Diagnosis not present

## 2017-05-19 DIAGNOSIS — R197 Diarrhea, unspecified: Secondary | ICD-10-CM | POA: Diagnosis not present

## 2017-05-19 DIAGNOSIS — M264 Malocclusion, unspecified: Secondary | ICD-10-CM | POA: Diagnosis present

## 2017-05-19 DIAGNOSIS — Z09 Encounter for follow-up examination after completed treatment for conditions other than malignant neoplasm: Secondary | ICD-10-CM

## 2017-05-19 DIAGNOSIS — R000 Tachycardia, unspecified: Secondary | ICD-10-CM | POA: Diagnosis not present

## 2017-05-19 DIAGNOSIS — K029 Dental caries, unspecified: Secondary | ICD-10-CM | POA: Diagnosis present

## 2017-05-19 DIAGNOSIS — K06020 Generalized gingival recession, unspecified: Secondary | ICD-10-CM | POA: Diagnosis present

## 2017-05-19 DIAGNOSIS — I509 Heart failure, unspecified: Secondary | ICD-10-CM | POA: Diagnosis not present

## 2017-05-19 DIAGNOSIS — K036 Deposits [accretions] on teeth: Secondary | ICD-10-CM | POA: Diagnosis not present

## 2017-05-19 DIAGNOSIS — Z01810 Encounter for preprocedural cardiovascular examination: Secondary | ICD-10-CM | POA: Diagnosis not present

## 2017-05-19 DIAGNOSIS — I130 Hypertensive heart and chronic kidney disease with heart failure and stage 1 through stage 4 chronic kidney disease, or unspecified chronic kidney disease: Secondary | ICD-10-CM | POA: Diagnosis present

## 2017-05-19 DIAGNOSIS — Z01818 Encounter for other preprocedural examination: Secondary | ICD-10-CM

## 2017-05-19 DIAGNOSIS — Z803 Family history of malignant neoplasm of breast: Secondary | ICD-10-CM

## 2017-05-19 DIAGNOSIS — K083 Retained dental root: Secondary | ICD-10-CM | POA: Diagnosis present

## 2017-05-19 DIAGNOSIS — J9811 Atelectasis: Secondary | ICD-10-CM | POA: Diagnosis not present

## 2017-05-19 DIAGNOSIS — I2510 Atherosclerotic heart disease of native coronary artery without angina pectoris: Secondary | ICD-10-CM

## 2017-05-19 DIAGNOSIS — I25810 Atherosclerosis of coronary artery bypass graft(s) without angina pectoris: Secondary | ICD-10-CM | POA: Diagnosis not present

## 2017-05-19 DIAGNOSIS — K045 Chronic apical periodontitis: Secondary | ICD-10-CM | POA: Diagnosis present

## 2017-05-19 DIAGNOSIS — I371 Nonrheumatic pulmonary valve insufficiency: Secondary | ICD-10-CM | POA: Diagnosis not present

## 2017-05-19 DIAGNOSIS — I214 Non-ST elevation (NSTEMI) myocardial infarction: Principal | ICD-10-CM | POA: Diagnosis present

## 2017-05-19 DIAGNOSIS — Z01811 Encounter for preprocedural respiratory examination: Secondary | ICD-10-CM

## 2017-05-19 DIAGNOSIS — I4891 Unspecified atrial fibrillation: Secondary | ICD-10-CM | POA: Diagnosis present

## 2017-05-19 DIAGNOSIS — K08409 Partial loss of teeth, unspecified cause, unspecified class: Secondary | ICD-10-CM | POA: Diagnosis not present

## 2017-05-19 DIAGNOSIS — J441 Chronic obstructive pulmonary disease with (acute) exacerbation: Secondary | ICD-10-CM | POA: Diagnosis present

## 2017-05-19 DIAGNOSIS — Z8261 Family history of arthritis: Secondary | ICD-10-CM

## 2017-05-19 DIAGNOSIS — K219 Gastro-esophageal reflux disease without esophagitis: Secondary | ICD-10-CM | POA: Diagnosis present

## 2017-05-19 DIAGNOSIS — Z823 Family history of stroke: Secondary | ICD-10-CM

## 2017-05-19 HISTORY — DX: Non-ST elevation (NSTEMI) myocardial infarction: I21.4

## 2017-05-19 HISTORY — DX: Gastro-esophageal reflux disease without esophagitis: K21.9

## 2017-05-19 HISTORY — DX: Headache, unspecified: R51.9

## 2017-05-19 HISTORY — DX: Atherosclerotic heart disease of native coronary artery without angina pectoris: I25.10

## 2017-05-19 HISTORY — DX: Heart failure, unspecified: I50.9

## 2017-05-19 HISTORY — DX: Headache: R51

## 2017-05-19 LAB — CBC
HEMATOCRIT: 37.1 % (ref 35.0–47.0)
HEMATOCRIT: 38.4 % (ref 36.0–46.0)
HEMOGLOBIN: 12.7 g/dL (ref 12.0–16.0)
Hemoglobin: 12.6 g/dL (ref 12.0–15.0)
MCH: 30.9 pg (ref 26.0–34.0)
MCH: 30.4 pg (ref 26.0–34.0)
MCHC: 34.1 g/dL (ref 32.0–36.0)
MCHC: 32.8 g/dL (ref 30.0–36.0)
MCV: 90.7 fL (ref 80.0–100.0)
MCV: 92.8 fL (ref 78.0–100.0)
Platelets: 210 10*3/uL (ref 150–440)
Platelets: 214 10*3/uL (ref 150–400)
RBC: 4.1 MIL/uL (ref 3.80–5.20)
RBC: 4.14 MIL/uL (ref 3.87–5.11)
RDW: 14.8 % — ABNORMAL HIGH (ref 11.5–14.5)
RDW: 15.1 % (ref 11.5–15.5)
WBC: 9.3 10*3/uL (ref 3.6–11.0)
WBC: 10.7 10*3/uL — ABNORMAL HIGH (ref 4.0–10.5)

## 2017-05-19 LAB — COMPREHENSIVE METABOLIC PANEL
ALBUMIN: 3.4 g/dL — AB (ref 3.5–5.0)
ALT: 21 U/L (ref 14–54)
ANION GAP: 11 (ref 5–15)
AST: 22 U/L (ref 15–41)
Alkaline Phosphatase: 64 U/L (ref 38–126)
BILIRUBIN TOTAL: 0.9 mg/dL (ref 0.3–1.2)
BUN: 23 mg/dL — AB (ref 6–20)
CALCIUM: 8.6 mg/dL — AB (ref 8.9–10.3)
CO2: 23 mmol/L (ref 22–32)
Chloride: 106 mmol/L (ref 101–111)
Creatinine, Ser: 1.2 mg/dL — ABNORMAL HIGH (ref 0.44–1.00)
GFR calc Af Amer: 53 mL/min — ABNORMAL LOW (ref >60)
GFR calc non Af Amer: 46 mL/min — ABNORMAL LOW (ref >60)
GLUCOSE: 99 mg/dL (ref 65–99)
Potassium: 3.5 mmol/L (ref 3.5–5.1)
Sodium: 140 mmol/L (ref 135–145)
TOTAL PROTEIN: 5.6 g/dL — AB (ref 6.5–8.1)

## 2017-05-19 LAB — HEPARIN LEVEL (UNFRACTIONATED)
Heparin Unfractionated: 0.25 IU/mL — ABNORMAL LOW (ref 0.30–0.70)
Heparin Unfractionated: 0.13 IU/mL — ABNORMAL LOW (ref 0.30–0.70)

## 2017-05-19 LAB — HEMOGLOBIN A1C
Hgb A1c MFr Bld: 5.5 % (ref 4.8–5.6)
Mean Plasma Glucose: 111.15 mg/dL

## 2017-05-19 MED ORDER — ACETAMINOPHEN 325 MG PO TABS: 650.0000 mg | ORAL_TABLET | ORAL | Status: DC | PRN

## 2017-05-19 MED ORDER — HEPARIN BOLUS VIA INFUSION
1300.0000 [IU] | Freq: Once | INTRAVENOUS | Status: AC
  Administered 2017-05-19: 1300 [IU] via INTRAVENOUS
  Filled 2017-05-19: qty 1300

## 2017-05-19 MED ORDER — HEPARIN (PORCINE) IN NACL 100-0.45 UNIT/ML-% IJ SOLN
1250.0000 [IU]/h | INTRAMUSCULAR | Status: DC
  Administered 2017-05-19: 1250 [IU]/h via INTRAVENOUS
  Administered 2017-05-20: 1800 [IU]/h via INTRAVENOUS
  Administered 2017-05-22: 1250 [IU]/h via INTRAVENOUS
  Filled 2017-05-19 (×4): qty 250

## 2017-05-19 MED ORDER — ALBUTEROL SULFATE (2.5 MG/3ML) 0.083% IN NEBU: 3.0000 mL | INHALATION_SOLUTION | Freq: Four times a day (QID) | RESPIRATORY_TRACT | Status: DC | PRN

## 2017-05-19 MED ORDER — ASPIRIN 300 MG RE SUPP: 300.0000 mg | RECTAL | Status: DC

## 2017-05-19 MED ORDER — MOMETASONE FURO-FORMOTEROL FUM 100-5 MCG/ACT IN AERO
2.0000 | INHALATION_SPRAY | Freq: Two times a day (BID) | RESPIRATORY_TRACT | Status: DC
  Administered 2017-05-19 – 2017-05-27 (×15): 2 via RESPIRATORY_TRACT
  Filled 2017-05-19 (×2): qty 8.8

## 2017-05-19 MED ORDER — ROSUVASTATIN CALCIUM 10 MG PO TABS
20.0000 mg | ORAL_TABLET | Freq: Every day | ORAL | Status: DC
  Administered 2017-05-19 – 2017-05-26 (×8): 20 mg via ORAL
  Filled 2017-05-19 (×5): qty 2
  Filled 2017-05-19: qty 1
  Filled 2017-05-19: qty 2
  Filled 2017-05-19 (×2): qty 1

## 2017-05-19 MED ORDER — LOPERAMIDE HCL 2 MG PO CAPS
2.0000 mg | ORAL_CAPSULE | ORAL | Status: DC | PRN
  Administered 2017-05-19: 2 mg via ORAL
  Filled 2017-05-19: qty 1

## 2017-05-19 MED ORDER — ASPIRIN 81 MG PO CHEW: 324.0000 mg | CHEWABLE_TABLET | ORAL | Status: DC

## 2017-05-19 MED ORDER — AMLODIPINE BESYLATE 10 MG PO TABS
10.0000 mg | ORAL_TABLET | Freq: Every day | ORAL | Status: DC
  Administered 2017-05-19 – 2017-05-21 (×3): 10 mg via ORAL
  Filled 2017-05-19 (×3): qty 1

## 2017-05-19 MED ORDER — NITROGLYCERIN 0.4 MG SL SUBL: 0.4000 mg | SUBLINGUAL_TABLET | SUBLINGUAL | Status: DC | PRN

## 2017-05-19 MED ORDER — ONDANSETRON HCL 4 MG/2ML IJ SOLN
4.0000 mg | Freq: Four times a day (QID) | INTRAMUSCULAR | Status: DC | PRN
  Administered 2017-05-21: 4 mg via INTRAVENOUS
  Filled 2017-05-19: qty 2

## 2017-05-19 MED ORDER — FAMOTIDINE 20 MG PO TABS
10.0000 mg | ORAL_TABLET | Freq: Every day | ORAL | Status: DC
  Administered 2017-05-19 – 2017-05-27 (×8): 10 mg via ORAL
  Filled 2017-05-19 (×8): qty 1

## 2017-05-19 MED ORDER — FUROSEMIDE 20 MG PO TABS
20.0000 mg | ORAL_TABLET | Freq: Every day | ORAL | Status: DC
  Administered 2017-05-19 – 2017-05-21 (×3): 20 mg via ORAL
  Filled 2017-05-19 (×3): qty 1

## 2017-05-19 MED ORDER — PREDNISONE 20 MG PO TABS
30.0000 mg | ORAL_TABLET | Freq: Every day | ORAL | Status: AC
  Administered 2017-05-19: 30 mg via ORAL
  Filled 2017-05-19: qty 1

## 2017-05-19 MED ORDER — PREDNISONE 20 MG PO TABS
20.0000 mg | ORAL_TABLET | Freq: Every day | ORAL | Status: AC
  Administered 2017-05-19: 20 mg via ORAL
  Filled 2017-05-19 (×2): qty 1

## 2017-05-19 MED ORDER — ACETAMINOPHEN 500 MG PO TABS
500.0000 mg | ORAL_TABLET | Freq: Every day | ORAL | Status: DC
  Administered 2017-05-19 – 2017-05-21 (×3): 500 mg via ORAL
  Filled 2017-05-19 (×3): qty 1

## 2017-05-19 MED ORDER — PREDNISONE 20 MG PO TABS: 20.0000 mg | ORAL_TABLET | Freq: Every morning | ORAL | Status: DC

## 2017-05-19 MED ORDER — CLONIDINE HCL 0.3 MG/24HR TD PTWK: 0.3000 mg | MEDICATED_PATCH | TRANSDERMAL | Status: DC

## 2017-05-19 MED ORDER — CARVEDILOL 3.125 MG PO TABS
3.1250 mg | ORAL_TABLET | Freq: Two times a day (BID) | ORAL | Status: DC
  Administered 2017-05-19 – 2017-05-21 (×5): 3.125 mg via ORAL
  Filled 2017-05-19 (×5): qty 1

## 2017-05-19 MED ORDER — HYDRALAZINE HCL 50 MG PO TABS
100.0000 mg | ORAL_TABLET | Freq: Three times a day (TID) | ORAL | Status: DC
  Administered 2017-05-19 – 2017-05-21 (×8): 100 mg via ORAL
  Filled 2017-05-19 (×8): qty 2

## 2017-05-19 MED ORDER — ASPIRIN EC 81 MG PO TBEC
81.0000 mg | DELAYED_RELEASE_TABLET | Freq: Every day | ORAL | Status: DC
  Administered 2017-05-19 – 2017-05-21 (×3): 81 mg via ORAL
  Filled 2017-05-19 (×4): qty 1

## 2017-05-19 MED ORDER — AZITHROMYCIN 250 MG PO TABS
250.0000 mg | ORAL_TABLET | Freq: Every day | ORAL | Status: DC
  Administered 2017-05-19 – 2017-05-20 (×2): 250 mg via ORAL
  Filled 2017-05-19 (×3): qty 1

## 2017-05-19 MED ORDER — LOSARTAN POTASSIUM 25 MG PO TABS
25.0000 mg | ORAL_TABLET | Freq: Every day | ORAL | Status: DC
  Administered 2017-05-19 – 2017-05-20 (×2): 25 mg via ORAL
  Filled 2017-05-19 (×2): qty 1

## 2017-05-19 MED ORDER — PREDNISONE 10 MG PO TABS: 10.0000 mg | ORAL_TABLET | Freq: Every day | ORAL | Status: DC

## 2017-05-19 MED ORDER — ENSURE ENLIVE PO LIQD: 237.0000 mL | Freq: Two times a day (BID) | ORAL | Status: DC

## 2017-05-19 MED ORDER — LORATADINE 10 MG PO TABS
10.0000 mg | ORAL_TABLET | Freq: Every day | ORAL | Status: DC
  Administered 2017-05-19 – 2017-05-21 (×3): 10 mg via ORAL
  Filled 2017-05-19 (×3): qty 1

## 2017-05-19 MED ORDER — ENOXAPARIN SODIUM 40 MG/0.4ML ~~LOC~~ SOLN: 40.0000 mg | SUBCUTANEOUS | Status: DC

## 2017-05-19 NOTE — Consult Note (Signed)
ANTICOAGULATION CONSULT NOTE - Initial Consult  Pharmacy Consult for heparin drip Indication: chest pain/ACS- pt transferring to [place] for CABG  Allergies  Allergen Reactions  . Lisinopril Cough  . Ace Inhibitors Other (See Comments)    Has been told to avoid these and any diuretics because "left kidney has shrunk up and doesn't work"    Patient Measurements: Height: 5\' 9"  (175.3 cm) Weight: 219 lb 4.8 oz (99.5 kg) IBW/kg (Calculated) : 66.2 Heparin Dosing Weight: 88.7kg  Vital Signs: Temp: 98 F (36.7 C) (04/15 1942) Temp Source: Oral (04/15 1942) BP: 139/67 (04/15 1942) Pulse Rate: 86 (04/15 1942)  Labs: Recent Labs    05/16/17 0732 05/16/17 1018 05/16/17 1344  05/17/17 0519 05/18/17 0456 05/18/17 1042 05/19/17 0327  HGB  --   --   --    < > 13.6 12.6  --  12.7  HCT  --   --   --   --  39.7 35.5  --  37.1  PLT  --   --   --   --  250 192  --  210  LABPROT  --   --   --   --   --  13.4  --   --   INR  --   --   --   --   --  1.03  --   --   HEPARINUNFRC  --   --  0.47   < > 0.43 0.28* 0.62 0.25*  CREATININE  --   --   --   --  1.17* 1.17*  --   --   TROPONINI 0.46* 0.54* 0.58*  --   --   --   --   --    < > = values in this interval not displayed.    Estimated Creatinine Clearance: 58.6 mL/min (A) (by C-G formula based on SCr of 1.17 mg/dL (H)).   Medical History: Past Medical History:  Diagnosis Date  . Arthritis   . Asthma   . Chronic kidney disease   . Hypertension   . Nephrolithiasis     Medications:  Scheduled:  . acetaminophen  500 mg Oral Daily  . amLODipine  10 mg Oral Daily  . aspirin EC  81 mg Oral Daily  . azithromycin  250 mg Oral Daily  . budesonide (PULMICORT) nebulizer solution  0.5 mg Nebulization BID  . carvedilol  3.125 mg Oral BID WC  . cloNIDine  0.3 mg Transdermal Weekly  . famotidine  10 mg Oral Daily  . furosemide  20 mg Oral Daily  . heparin  1,300 Units Intravenous Once  . hydrALAZINE  100 mg Oral TID  .  ipratropium-albuterol  3 mL Nebulization Q6H  . loratadine  10 mg Oral Daily  . losartan  25 mg Oral Daily  . multivitamin with minerals  1 tablet Oral Daily  . potassium chloride  40 mEq Oral Once  . predniSONE  30 mg Oral Q breakfast  . protein supplement shake  11 oz Oral Q24H  . rosuvastatin  20 mg Oral Daily  . 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 It was recommened that patient transfer for CABG. Pharmacy consulted to restart heparin drip 8 hours after sheath removal (1255). Pt was therapeutic at 1250 units/hr. Will restart at 2100 (8 hr after sheath removal)  Goal of  Therapy:  Heparin level 0.3-0.7 units/ml Monitor platelets by anticoagulation protocol: Yes   Plan:  Start heparin infusion at 1250 units/hr Check anti-Xa level in 6 hours and daily while on heparin Continue to monitor H&H and platelets   4/16 0330 heparin level 0.25. 1300 units bolus and increase rate to 1450 units/hr. Recheck in 6 hours.  Fulton ReekMatt Dniyah Grant, PharmD, BCPS  05/19/17 4:40 AM

## 2017-05-19 NOTE — Progress Notes (Signed)
Attempt to call report at 4 east [phone] per unit secretary nurse in progression.

## 2017-05-19 NOTE — Care Management Note (Signed)
Case Management Note Donn PieriniKristi Danton Palmateer RN, BSN Unit 4E-Case Manager [phone]  Patient Details  Name: Audrey BimlerKatherine R Caplin MRN: [id number] Date of Birth: Jun 10, 1950  Subjective/Objective:   Pt [facility]- consult pending                Action/Plan: PTA pt lived at home alone, PCP-Eric Sonnenberg, CM to follow for transition of care needs   Expected Discharge Date:                  Expected Discharge Plan:  Home/Self Care  In-House Referral:     Discharge planning Services  CM Consult  Post Acute Care Choice:    Choice offered to:     DME Arranged:    DME Agency:     HH Arranged:    HH Agency:     Status of Service:     If discussed at [employer] Length of Stay Meetings, dates discussed:    Discharge Disposition:   Additional Comments:  Darrold SpanWebster, Lucillie Kiesel Hall, RN 05/19/2017, 12:18 PM

## 2017-05-19 NOTE — Progress Notes (Addendum)
Report given to Star, Charity fundraiserN at [facility]. Pt being transferred for possible cardiac bypass.

## 2017-05-19 NOTE — Consult Note (Signed)
ANTICOAGULATION CONSULT NOTE   Pharmacy Consult for heparin drip Indication: chest pain/ACS   Allergies  Allergen Reactions  . Lisinopril Cough  . Ace Inhibitors Other (See Comments)    Has been told to avoid these and any diuretics because "left kidney has shrunk up and doesn't work"    Patient Measurements:   Heparin Dosing Weight: 88.7kg  Vital Signs: Temp: 97.8 F (36.6 C) (04/16 1212) Temp Source: Oral (04/16 1212) BP: 138/63 (04/16 1212) Pulse Rate: 85 (04/16 1212)  Labs: Recent Labs    05/16/17 1344  05/17/17 0519 05/18/17 0456 05/18/17 1042 05/19/17 0327 05/19/17 1130  HGB  --    < > 13.6 12.6  --  12.7 12.6  HCT  --    < > 39.7 35.5  --  37.1 38.4  PLT  --    < > 250 192  --  210 214  LABPROT  --   --   --  13.4  --   --   --   INR  --   --   --  1.03  --   --   --   HEPARINUNFRC 0.47   < > 0.43 0.28* 0.62 0.25*  --   CREATININE  --   --  1.17* 1.17*  --   --  1.20*  TROPONINI 0.58*  --   --   --   --   --   --    < > = values in this interval not displayed.    Estimated Creatinine Clearance: 57.4 mL/min (A) (by C-G formula based on SCr of 1.2 mg/dL (H)).   Medical History: Past Medical History:  Diagnosis Date  . Arthritis   . Asthma   . Chronic kidney disease   . Hypertension   . Nephrolithiasis     Medications:  Scheduled:  . acetaminophen  500 mg Oral Daily  . amLODipine  10 mg Oral Daily  . [START ON 05/20/2017] aspirin EC  81 mg Oral Daily  . azithromycin  250 mg Oral Daily  . carvedilol  3.125 mg Oral BID WC  . [START ON 05/23/2017] cloNIDine  0.3 mg Transdermal Weekly  . famotidine  10 mg Oral Daily  . furosemide  20 mg Oral Daily  . hydrALAZINE  100 mg Oral TID  . loratadine  10 mg Oral Daily  . losartan  25 mg Oral Daily  . mometasone-formoterol  2 puff Inhalation BID  . predniSONE  30 mg Oral Q breakfast   Followed by  . [START ON 05/20/2017] predniSONE  20 mg Oral Q breakfast   Followed by  . [START ON 05/21/2017] predniSONE   10 mg Oral Q breakfast  . rosuvastatin  20 mg Oral QHS    Assessment: 67 year old [facility]. Heparin resumed last evening post cath. Overnight the RPh increased the heparin rate to 1450 units/hr for subtherapeutic lab. [facility], there were no issues reported with the infusion. [facility] the heparin infusion was reordered at 1250 units/hr. RN reports changing the infusion rate at ~1200. No overt bleeding reported. Will order an 8 hour level and assess.   Goal of Therapy:  Heparin level 0.3-0.7 units/ml Monitor platelets by anticoagulation protocol: Yes   Plan:  Continue heparin gtt at 1250 units/hr Check anti-Xa level in 6 hours and daily while on heparin Continue to monitor H&H and platelets   Ruben Imony Taelon Bendorf, PharmD Clinical Pharmacist 05/19/2017 1:46 PM

## 2017-05-19 NOTE — Progress Notes (Signed)
Audrey BimlerKatherine R Wolf to be [facility] per MD order.    Allergies as of 05/19/2017      Reactions   Lisinopril Cough   Ace Inhibitors Other (See Comments)   Has been told to avoid these and any diuretics because "left kidney has shrunk up and doesn't work"      Medication List    TAKE these medications   acetaminophen 500 MG tablet Commonly known as:  TYLENOL Take 500 mg by mouth daily.   albuterol 108 (90 Base) MCG/ACT inhaler Commonly known as:  PROVENTIL HFA;VENTOLIN HFA Inhale 2 puffs into the lungs every 6 (six) hours as needed for wheezing or shortness of breath.   amLODipine 10 MG tablet Commonly known as:  NORVASC Take 10 mg by mouth daily.   ASPIRIN 81 PO Take 81 mg by mouth.   azithromycin 250 MG tablet Commonly known as:  ZITHROMAX One tab daily for two more days   budesonide-formoterol 80-4.5 MCG/ACT inhaler Commonly known as:  SYMBICORT Inhale 2 puffs into the lungs 2 (two) times daily.   carvedilol 3.125 MG tablet Commonly known as:  COREG Take 1 tablet (3.125 mg total) by mouth 2 (two) times daily with a meal.   cloNIDine 0.3 mg/24hr patch Commonly known as:  CATAPRES - Dosed in mg/24 hr Place 1 patch (0.3 mg total) onto the skin once a week.   furosemide 20 MG tablet Commonly known as:  LASIX Take 1 tablet (20 mg total) by mouth daily.   heparin 100-0.45 UNIT/ML-% infusion Inject 1,250 Units/hr into the vein continuous.   hydrALAZINE 100 MG tablet Commonly known as:  APRESOLINE Take 1 tablet (100 mg total) by mouth 3 (three) times daily.   loratadine 10 MG tablet Commonly known as:  CLARITIN Take 10 mg by mouth daily.   losartan 25 MG tablet Commonly known as:  COZAAR Take 1 tablet (25 mg total) by mouth daily.   nitroGLYCERIN 0.4 MG SL tablet Commonly known as:  NITROSTAT Place 1 tablet (0.4 mg total) under the tongue every 5 (five) minutes x 3 doses as needed for chest pain.   predniSONE 10 MG tablet Commonly  known as:  DELTASONE Three tabs po day1; 2 tabs po day2; 1 tab po day3 then stop   rosuvastatin 20 MG tablet Commonly known as:  CRESTOR Take 1 tablet (20 mg total) by mouth daily.   ZANTAC PO Take 150 mg by mouth daily.       Vitals:   05/19/17 0739 05/19/17 0812  BP: (!) 162/85 (!) 153/79  Pulse: 92 87  Resp: 18 16  Temp: 98 F (36.7 C) 98.2 F (36.8 C)  SpO2: 99% 100%    Skin clean, dry and intact without evidence of skin break down, no evidence of skin tears noted.pt discharged with  IV catheter Pt denies pain at this time. No complaints noted.  Patient escorted via stretcher, and D/C via carelink  Rigoberto NoelErica Y Yalissa Fink

## 2017-05-19 NOTE — H&P (Addendum)
[address]       [place] [id number]             [phone]        Alvy BimlerKatherine R [facility] Medical Record #[id number] Date of Birth: December 28, 1950  Referring: No ref. provider found Primary Care: Glori LuisSonnenberg, Eric G, MD Primary Cardiologist: Adrian BlackwaterShaukat Khan, MD    Chief Complaint: Shortness of breath   History of Present Illness:      [name] is a 67 year old female with a past medical history significant for hypertension, hyperlipidemia, COPD/asthma, nephrolithiasis, CKD, GERD, and congestive heart failure who developed bronchitis/upper respiratory infection in March 2019 which was managed outpatient.  She states that she improved and then about 10 days ago she developed a viral GI illness for which see she saw her PCP.  She states this was transient and went back to work a few days later.  She noticed however some increased fatigue and malaise and reduced exercise tolerance over the past week.  She also has a persistent dry cough without any sputum production.  She has mild chronic shortness of breath at baseline but became markedly short of breath on Friday, 05/15/2017.  A CT of the chest performed in the emergency department demonstrated pleural effusions.  At that time she did have an elevated troponin of 0.41.  Cardiology was consulted due to an elevated troponin.  This time the patient did not have any chest pain but did have orthopnea.  An EKG was performed which showed ischemia anterior lateral.  She did have a negative stress test 2 years ago but they recommended cardiac catheterization.  On 05/18/2017 she underwent a cardiac catheterization which showed a 70% ostial left main lesion.  She was then [facility] for cardiac surgery evaluation for possible surgical revascularization.  The patient works at [employer]. She is on her feet all day. Saturday she had chest pain but has not had pain since. She does have pain in her left  leg which she feels could be peripheral arterial disease since she has a strong family history.    Current Activity/ Functional Status: Patient was independent with mobility/ambulation, transfers, ADL's, IADL's.   Zubrod Score: At the time of surgery this patient's most appropriate activity status/level should be described as: []     0    Normal activity, no symptoms [x]     1    Restricted in physical strenuous activity but ambulatory, able to do out light work []     2    Ambulatory and capable of self care, unable to do work activities, up and about                 more than 50%  Of the time                            []     3    Only limited self care, in bed greater than 50% of waking hours []     4    Completely disabled, no self care, confined to bed or chair []     5    Moribund  Past Medical History:  Diagnosis Date  . Arthritis   . Asthma   . Chronic kidney disease   . Hypertension   . Nephrolithiasis     Past Surgical History:  Procedure Laterality Date  . ABDOMINAL HYSTERECTOMY    .  APPENDECTOMY    . BREAST BIOPSY Left 12/31/2010   neg  . LEFT HEART CATH AND CORONARY ANGIOGRAPHY N/A 05/18/2017   Procedure: LEFT HEART CATH AND CORONARY ANGIOGRAPHY;  Surgeon: Laurier Nancy, MD;  Location: [facility] INVASIVE CV LAB;  Service: Cardiovascular;  Laterality: N/A;  . LITHOTRIPSY    . TONSILLECTOMY      Social History   Tobacco Use  Smoking Status Never Smoker  Smokeless Tobacco Never Used    Social History   Substance and Sexual Activity  Alcohol Use No    Social History   Socioeconomic History  . Marital status: Single    Spouse name: Not on file  . Number of children: Not on file  . Years of education: Not on file  . Highest education level: Not on file  Occupational History    Employer: [employer]  Social Needs  . Financial resource strain: Not on file  . Food insecurity:    Worry: Not on file    Inability: Not on file  . Transportation needs:    Medical:  Not on file    Non-medical: Not on file  Tobacco Use  . Smoking status: Never Smoker  . Smokeless tobacco: Never Used  Substance and Sexual Activity  . Alcohol use: No  . Drug use: No  . Sexual activity: Never    Partners: Male  Lifestyle  . Physical activity:    Days per week: Not on file    Minutes per session: Not on file  . Stress: Not on file  Relationships  . Social connections:    Talks on phone: Not on file    Gets together: Not on file    Attends religious service: Not on file    Active member of club or organization: Not on file    Attends meetings of clubs or organizations: Not on file    Relationship status: Not on file  . Intimate partner violence:    Fear of current or ex partner: Not on file    Emotionally abused: Not on file    Physically abused: Not on file    Forced sexual activity: Not on file  Other Topics Concern  . Not on file  Social History Narrative  . Not on file    Allergies  Allergen Reactions  . Lisinopril Cough  . Ace Inhibitors Other (See Comments)    Has been told to avoid these and any diuretics because "left kidney has shrunk up and doesn't work"    Current Facility-Administered Medications  Medication Dose Route Frequency Provider Last Rate Last Dose  . acetaminophen (TYLENOL) tablet 500 mg  500 mg Oral Daily Conte, Tessa N, PA-C      . albuterol (PROVENTIL HFA;VENTOLIN HFA) 108 (90 Base) MCG/ACT inhaler 2 puff  2 puff Inhalation Q6H PRN Conte, Tessa N, PA-C      . amLODipine (NORVASC) tablet 10 mg  10 mg Oral Daily Jari Favre N, [place]      . [START ON 05/20/2017] aspirin EC tablet 81 mg  81 mg Oral Daily Conte, Tessa N, PA-C      . azithromycin [facility]) tablet 250 mg  250 mg Oral Daily Conte, Tessa N, PA-C      . carvedilol (COREG) tablet 3.125 mg  3.125 mg Oral BID WC Conte, Tessa N, PA-C      . cloNIDine (CATAPRES - Dosed in mg/24 hr) patch 0.3 mg  0.3 mg Transdermal Weekly Conte, Tessa N, PA-C      .  famotidine (PEPCID)  tablet 10 mg  10 mg Oral Daily Conte, Tessa N, PA-C      . furosemide (LASIX) tablet 20 mg  20 mg Oral Daily Conte, Tessa N, [place]      . heparin ADULT infusion 100 units/mL (25000 units/262mL sodium chloride 0.45%)  1,250 Units/hr Intravenous Continuous Asa Lente, Tessa N, PA-C 12.5 mL/hr at 05/19/17 1150 1,250 Units/hr at 05/19/17 1150  . hydrALAZINE (APRESOLINE) tablet 100 mg  100 mg Oral TID Conte, Tessa N, PA-C      . loratadine (CLARITIN) tablet 10 mg  10 mg Oral Daily Conte, Tessa N, PA-C      . losartan (COZAAR) tablet 25 mg  25 mg Oral Daily Conte, Tessa N, PA-C      . mometasone-formoterol (DULERA) 100-5 MCG/ACT inhaler 2 puff  2 puff Inhalation BID Conte, Tessa N, PA-C      . nitroGLYCERIN (NITROSTAT) SL tablet 0.4 mg  0.4 mg Sublingual Q5 Min x 3 PRN Conte, Tessa N, PA-C      . ondansetron (ZOFRAN) injection 4 mg  4 mg Intravenous Q6H PRN Conte, Tessa N, PA-C      . predniSONE (DELTASONE) tablet 20 mg  20 mg Oral q morning - 10a Conte, Tessa N, PA-C      . rosuvastatin (CRESTOR) tablet 20 mg  20 mg Oral Daily Conte, Tessa N, PA-C        Medications Prior to Admission  Medication Sig Dispense Refill Last Dose  . acetaminophen (TYLENOL) 500 MG tablet Take 500 mg by mouth daily.   05/15/2017 at 0800  . albuterol (PROVENTIL HFA;VENTOLIN HFA) 108 (90 Base) MCG/ACT inhaler Inhale 2 puffs into the lungs every 6 (six) hours as needed for wheezing or shortness of breath. 1 Inhaler 0 prn at prn  . amLODipine (NORVASC) 10 MG tablet Take 10 mg by mouth daily.    05/15/2017 at 0800  . ASPIRIN 81 PO Take 81 mg by mouth.   05/15/2017 at 0800  . azithromycin (ZITHROMAX) 250 MG tablet One tab daily for two more days 6 each 0   . budesonide-formoterol (SYMBICORT) 80-4.5 MCG/ACT inhaler Inhale 2 puffs into the lungs 2 (two) times daily. 1 Inhaler 12   . carvedilol (COREG) 3.125 MG tablet Take 1 tablet (3.125 mg total) by mouth 2 (two) times daily with a meal. (Patient not taking: Reported on 05/07/2017) 60  tablet 3 Not Taking at Unknown time  . cloNIDine (CATAPRES - DOSED IN MG/24 HR) 0.3 mg/24hr patch Place 1 patch (0.3 mg total) onto the skin once a week. 12 patch 3 Past Week at Unknown time  . furosemide (LASIX) 20 MG tablet Take 1 tablet (20 mg total) by mouth daily. 30 tablet    . heparin 100-0.45 UNIT/ML-% infusion Inject 1,250 Units/hr into the vein continuous. 250 mL    . hydrALAZINE (APRESOLINE) 100 MG tablet Take 1 tablet (100 mg total) by mouth 3 (three) times daily. 270 tablet 1 05/15/2017 at 1600  . loratadine (CLARITIN) 10 MG tablet Take 10 mg by mouth daily.    05/15/2017 at 0800  . losartan (COZAAR) 25 MG tablet Take 1 tablet (25 mg total) by mouth daily. 30 tablet 1 05/15/2017 at 1000  . nitroGLYCERIN (NITROSTAT) 0.4 MG SL tablet Place 1 tablet (0.4 mg total) under the tongue every 5 (five) minutes x 3 doses as needed for chest pain.  12   . predniSONE (DELTASONE) 10 MG tablet Three tabs po day1; 2 tabs po day2;  1 tab po day3 then stop 30 tablet 0   . RaNITidine HCl (ZANTAC PO) Take 150 mg by mouth daily.    05/15/2017 at 0800  . rosuvastatin (CRESTOR) 20 MG tablet Take 1 tablet (20 mg total) by mouth daily. 90 tablet 3 05/15/2017 at 1800    Family History  Problem Relation Age of Onset  . Arthritis Mother   . Arthritis Father   . Heart disease Father   . Stroke Father   . Sudden Cardiac Death Father   . Breast cancer Maternal Aunt      Review of Systems:   Review of Systems  Constitutional: Positive for malaise/fatigue.  HENT: Negative.   Respiratory: Positive for cough and shortness of breath. Negative for sputum production and wheezing.   Cardiovascular: Positive for leg swelling. Negative for chest pain.  Gastrointestinal: Positive for diarrhea and nausea. Negative for vomiting.  Neurological: Negative.   Psychiatric/Behavioral: Negative.    Pertinent items are noted in HPI.       Physical Exam: BP 139/78 (BP Location: Left Arm)   Pulse 89   Temp 97.6 F (36.4  C) (Oral)   Resp 18   SpO2 100%    General appearance: alert, cooperative and no distress Resp: clear to auscultation bilaterally Cardio: regular rate and rhythm, S1, S2 normal, no murmur, click, rub or gallop GI: soft, non-tender; bowel sounds normal; no masses,  no organomegaly Extremities: 2+ non pitting edema, good distal pulses, warm extremities Neurologic: Grossly normal  Diagnostic Studies & Laboratory data:     Recent Radiology Findings:   No results found.   I have independently reviewed the above radiologic studies.  Recent Lab Findings: Lab Results  Component Value Date   WBC 9.3 05/19/2017   HGB 12.7 05/19/2017   HCT 37.1 05/19/2017   PLT 210 05/19/2017   GLUCOSE 93 05/18/2017   CHOL 99 05/17/2017   TRIG 72 05/17/2017   HDL 39 (L) 05/17/2017   LDLCALC 46 05/17/2017   ALT 15 10/23/2016   AST 14 10/23/2016   NA 140 05/18/2017   K 2.9 (L) 05/18/2017   CL 107 05/18/2017   CREATININE 1.17 (H) 05/18/2017   BUN 30 (H) 05/18/2017   CO2 24 05/18/2017   TSH 3.58 06/05/2016   INR 1.03 05/18/2017   HGBA1C 5.5 05/19/2017      Assessment / Plan:      1. CAD/NSTEMI-continue IV heparin, Pharmacy consult for dosing, sublingual nitro PRN for chest pain, continue ASA, statin, Coreg, and Cozaar.  2. HTN- on multiple agents: Norvasc, Coreg, Clonidine, Hydralazine, and Cozaar. Monitor closely.  3. COPD acute exacerbation-on Dulera and Albuterol PRN. Continue Nebs. Was also placed on Zithromax and a quick prednisone taper. Last dose of prednisone should be tomorrow.  4. Acute on chronic diastolic congestive heart failure- Currently on PO lasix. Echo showed LVEF of 50%. Monitor daily weights.  5. CKD- creatinine today 1.20. Limit nephrotoxic agents. Patient only has one working kidney.  6. GERD- on Pepcid, does currently have some nausea-Zofran PRN ordered.  7. Diarrhea- possibly from the antibiotics? I will order Imodium and a c-diff panel.  8. Left leg pain- chronic, is  worse with ambulation. Family history strong for PAD. Probably will require vascular workup at some point. Caroid [place] ordered.   Plan: Possibly to the OR Friday with Dr. Tyrone Sage. Continue pre-op workup.     I  spent 60 minutes counseling the patient face to face.   Jari Favre, PA-C 05/19/2017 12:11  PM  Patient seen and examined. Cath films reviewed  67 yo woman with obesity, hypertension, hyperlipidemia, stage 3A CKD , arthritis and asthma who presented with shortness of breath. R/i for non STEMI with troponin of 0.58. At catheterization has a 70% ostial left main stenosis. Currently no CP or SOB.  She needs CABG for left main disease but there are issues to resolve first. She needs PFT and carotid dopplers. She needs to complete steroids (tomorrow). She also needs a dental evaluation. Will order orthopantogram and consult Dr. Kristin Bruins in the morning. Currently has diarrhea. C diff has been sent but that needs to be resolved prior to surgery as well. She is tentatively scheduled for surgery on Friday with Dr. Tyrone Sage.  Salvatore Decent Dorris Fetch, MD [facility] [phone]

## 2017-05-19 NOTE — Progress Notes (Signed)
Patient ID: Audrey Wolf, female   DOB: 08-24-1950, 67 y.o.   MRN: [id number]  [facility] PROGRESS NOTE  Audrey Wolf:[id number] DOB: [birth year] DOA: 05/16/2017 PCP: Glori Luis, MD  HPI/Subjective: Patient feeling a little nervous.  Her breathing is better.  Had 4 episodes of diarrhea. She thinks the protein drink that they gave her last night had given her upset stomach.  Objective: Vitals:   05/19/17 0739 05/19/17 0812  BP: (!) 162/85 (!) 153/79  Pulse: 92 87  Resp: 18 16  Temp: 98 F (36.7 C) 98.2 F (36.8 C)  SpO2: 99% 100%    Filed Weights   05/17/17 0518 05/18/17 0345 05/19/17 0534  Weight: 99.2 kg (218 lb 12.8 oz) 99.5 kg (219 lb 4.8 oz) 100.4 kg (221 lb 4.8 oz)    ROS: Review of Systems  Constitutional: Negative for chills and fever.  Eyes: Negative for blurred vision.  Respiratory: Negative for cough and shortness of breath.   Cardiovascular: Negative for chest pain.  Gastrointestinal: Negative for abdominal pain, constipation, diarrhea, nausea and vomiting.  Genitourinary: Negative for dysuria.  Musculoskeletal: Negative for joint pain.  Neurological: Negative for dizziness and headaches.   Exam: Physical Exam  HENT:  Nose: No mucosal edema.  Mouth/Throat: No oropharyngeal exudate or posterior oropharyngeal edema.  Eyes: Pupils are equal, round, and reactive to light. Conjunctivae, EOM and lids are normal.  Neck: No JVD present. Carotid bruit is not present. No edema present. No thyroid mass and no thyromegaly present.  Cardiovascular: S1 normal and S2 normal. Exam reveals no gallop.  No murmur heard. Pulses:      Dorsalis pedis pulses are 2+ on the right side, and 2+ on the left side.  Respiratory: No respiratory distress. She has no wheezes. She has no rhonchi. She has no rales.  GI: Soft. Bowel sounds are normal. There is no tenderness.  Musculoskeletal:       Right ankle: She exhibits swelling.       Left ankle: She  exhibits swelling.  Lymphadenopathy:    She has no cervical adenopathy.  Neurological: She is alert. No cranial nerve deficit.  Skin: Skin is warm. No rash noted. Nails show no clubbing.  Psychiatric: She has a normal mood and affect.      Data Reviewed: Basic Metabolic Panel: Recent Labs  Lab 05/16/17 0308 05/16/17 1018 05/17/17 0519 05/18/17 0456  NA 141  --  140 140  K 3.2*  --  3.3* 2.9*  CL 114*  --  107 107  CO2 20*  --  23 24  GLUCOSE 140*  --  123* 93  BUN 18  --  22* 30*  CREATININE 1.11*  --  1.17* 1.17*  CALCIUM 8.5*  --  8.6* 8.0*  MG  --  1.7  --   --    CBC: Recent Labs  Lab 05/16/17 0319 05/17/17 0519 05/18/17 0456 05/19/17 0327  WBC 13.3* 13.2* 11.8* 9.3  NEUTROABS 11.0*  --   --   --   HGB 13.2 13.6 12.6 12.7  HCT 38.0 39.7 35.5 37.1  MCV 89.1 90.7 91.4 90.7  PLT 211 250 192 210   Cardiac Enzymes: Recent Labs  Lab 05/16/17 0308 05/16/17 0732 05/16/17 1018 05/16/17 1344  TROPONINI 0.41* 0.46* 0.54* 0.58*   BNP (last 3 results) Recent Labs    05/16/17 0333  BNP 461.0*    CBG: Recent Labs  Lab 05/18/17 0833  GLUCAP 103*    Recent  Results (from the past 240 hour(s))  Blood culture (routine x 2)     Status: None (Preliminary result)   Collection Time: 05/16/17  4:27 AM  Result Value Ref Range Status   Specimen Description BLOOD RIGHT HAND  Final   Special Requests   Final    BOTTLES DRAWN AEROBIC AND ANAEROBIC Blood Culture adequate volume   Culture   Final    NO GROWTH 3 DAYS Performed at [facility], [address] [place], [place] [address]    Report Status PENDING  Incomplete  Blood culture (routine x 2)     Status: None (Preliminary result)   Collection Time: 05/16/17  4:32 AM  Result Value Ref Range Status   Specimen Description BLOOD BLOOD LEFT WRIST  Final   Special Requests   Final    BOTTLES DRAWN AEROBIC AND ANAEROBIC Blood Culture adequate volume   Culture   Final    NO GROWTH 3 DAYS Performed at  [facility], [address] [place], [place] [address]    Report Status PENDING  Incomplete     Scheduled Meds: . acetaminophen  500 mg Oral Daily  . amLODipine  10 mg Oral Daily  . aspirin EC  81 mg Oral Daily  . azithromycin  250 mg Oral Daily  . budesonide (PULMICORT) nebulizer solution  0.5 mg Nebulization BID  . carvedilol  3.125 mg Oral BID WC  . cloNIDine  0.3 mg Transdermal Weekly  . famotidine  10 mg Oral Daily  . furosemide  20 mg Oral Daily  . hydrALAZINE  100 mg Oral TID  . ipratropium-albuterol  3 mL Nebulization Q6H  . loratadine  10 mg Oral Daily  . losartan  25 mg Oral Daily  . multivitamin with minerals  1 tablet Oral Daily  . potassium chloride  40 mEq Oral Once  . predniSONE  30 mg Oral Q breakfast  . rosuvastatin  20 mg Oral Daily  . sodium chloride flush  3 mL Intravenous Q12H   Continuous Infusions: . sodium chloride    . cefTRIAXone (ROCEPHIN)  IV Stopped (05/19/17 0606)  . heparin 1,450 Units/hr (05/19/17 0457)    Assessment/Plan:  1. NSTEMI-patient awaiting [facility] for [facility] of cardiac bypass.  Patient currently on heparin drip, aspirin, Coreg and Crestor.  Cardiac catheterization showed a left main lesion 70% stenosis with high-grade ostial left main disease with dampening of pressures wit 4 mm ST depressions. 2. Acute on chronic diastolic congestive heart failure with pleural effusions.  The patient was diuresed with IV Lasix and then switched over to oral Lasix.  Lungs clear currently. 3. COPD exacerbation.  Quick prednisone taper and empiric Zithromax.  Initial chest x-ray showed fluid rather than pneumonia. 4. Essential hypertension continue usual medications 5. Hyperlipidemia unspecified on Crestor.  LDL 46 6. Hypokalemia replaced yesterday recheckingToday.  Labs still pending.  7. Diarrhea stop protein drink.  Code Status:     Code Status Orders  (From admission, onward)        Start      Ordered   05/16/17 1342  Full code  Continuous     05/16/17 1341    Code Status History    This patient has a current code status but no historical code status.      Time spent: 32 minutes  Devlin Mcveigh [employer]  [employer]

## 2017-05-19 NOTE — Progress Notes (Signed)
SUBJECTIVE: Patient denies chest pin this morning   Vitals:   05/19/17 0235 05/19/17 0534 05/19/17 0739 05/19/17 0812  BP:  136/69 (!) 162/85 (!) 153/79  Pulse:  88 92 87  Resp:   18 16  Temp:  98.2 F (36.8 C) 98 F (36.7 C) 98.2 F (36.8 C)  TempSrc:  Oral Oral Oral  SpO2: 96% 99% 99% 100%  Weight:  221 lb 4.8 oz (100.4 kg)    Height:        Intake/Output Summary (Last 24 hours) at 05/19/2017 0859 Last data filed at 05/19/2017 0400 Gross per 24 hour  Intake 91.46 ml  Output 700 ml  Net -608.54 ml    LABS: Basic Metabolic Panel: Recent Labs    05/16/17 1018 05/17/17 0519 05/18/17 0456  NA  --  140 140  K  --  3.3* 2.9*  CL  --  107 107  CO2  --  23 24  GLUCOSE  --  123* 93  BUN  --  22* 30*  CREATININE  --  1.17* 1.17*  CALCIUM  --  8.6* 8.0*  MG 1.7  --   --    Liver Function Tests: No results for input(s): AST, ALT, ALKPHOS, BILITOT, PROT, ALBUMIN in the last 72 hours. No results for input(s): LIPASE, AMYLASE in the last 72 hours. CBC: Recent Labs    05/18/17 0456 05/19/17 0327  WBC 11.8* 9.3  HGB 12.6 12.7  HCT 35.5 37.1  MCV 91.4 90.7  PLT 192 210   Cardiac Enzymes: Recent Labs    05/16/17 1018 05/16/17 1344  TROPONINI 0.54* 0.58*   BNP: Invalid input(s): POCBNP D-Dimer: No results for input(s): DDIMER in the last 72 hours. Hemoglobin A1C: No results for input(s): HGBA1C in the last 72 hours. Fasting Lipid Panel: Recent Labs    05/17/17 0519  CHOL 99  HDL 39*  LDLCALC 46  TRIG 72  CHOLHDL 2.5   Thyroid Function Tests: No results for input(s): TSH, T4TOTAL, T3FREE, THYROIDAB in the last 72 hours.  Invalid input(s): FREET3 Anemia Panel: No results for input(s): VITAMINB12, FOLATE, FERRITIN, TIBC, IRON, RETICCTPCT in the last 72 hours.   PHYSICAL EXAM General: Well developed, well nourished, in no acute distress HEENT:  Normocephalic and atramatic Neck:  No JVD.  Lungs: Clear bilaterally to auscultation and  percussion. Heart: HRRR . Normal S1 and S2 without gallops or murmurs.  Abdomen: Bowel sounds are positive, abdomen soft and non-tender  Msk:  Back normal, normal gait. Normal strength and tone for age. Extremities: No clubbing, cyanosis or edema.   Neuro: Alert and oriented X 3. 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 Advise CABG Wade. F/u office at [facility] .  Active Problems:   SOB (shortness of breath)   Pleural effusion   Elevated troponin I level   NSTEMI (non-ST elevated myocardial infarction) (HCC)   CHF (congestive heart failure) (HCC)    Audrey Wolf A, MD, [facility] 05/19/2017 8:59 AM

## 2017-05-19 NOTE — Consult Note (Signed)
ANTICOAGULATION CONSULT NOTE   Pharmacy Consult for heparin Indication: chest pain/ACS   Allergies  Allergen Reactions  . Lisinopril Cough  . Ace Inhibitors Other (See Comments)    Has been told to avoid these and any diuretics because "left kidney has shrunk up and doesn't work"    Patient Measurements:   Heparin Dosing Weight: 88.7kg  Vital Signs: Temp: 97.8 F (36.6 C) (04/16 1212) Temp Source: Oral (04/16 1212) BP: 137/70 (04/16 1552) Pulse Rate: 85 (04/16 1212)  Labs: Recent Labs    05/17/17 0519 05/18/17 0456 05/18/17 1042 05/19/17 0327 05/19/17 1130 05/19/17 1753  HGB 13.6 12.6  --  12.7 12.6  --   HCT 39.7 35.5  --  37.1 38.4  --   PLT 250 192  --  210 214  --   LABPROT  --  13.4  --   --   --   --   INR  --  1.03  --   --   --   --   HEPARINUNFRC 0.43 0.28* 0.62 0.25*  --  0.13*  CREATININE 1.17* 1.17*  --   --  1.20*  --     Estimated Creatinine Clearance: 57.4 mL/min (A) (by C-G formula based on SCr of 1.2 mg/dL (H)).   Medications:  . heparin 1,250 Units/hr (05/19/17 1553)    Assessment:  67 year old female transferred from [facility]G possibly on Friday. Pharmacy consulted to manage IV heparin drip.  Heparin level is subtherapeutic at 0.13 on 1250 units/hr. No bleeding noted, CBC is normal.  Goal of Therapy:  Heparin level 0.3-0.7 units/ml Monitor platelets by anticoagulation protocol: Yes   Plan:  Increase heparin drip to 1550 units/hr F/u am labs Daily heparin level and CBC Monitor for s/sx of bleeding   Loura BackJennifer Oakvale, PharmD, BCPS Clinical Pharmacist Clinical phone for 05/19/2017 until 10p is [phone] After 10p, please call Main Rx at [phone] for assistance 05/19/2017 7:53 PM

## 2017-05-19 NOTE — Progress Notes (Signed)
PA notified of patients arrival. PA tp place orders.

## 2017-05-20 ENCOUNTER — Inpatient Hospital Stay (HOSPITAL_COMMUNITY): Payer: BLUE CROSS/BLUE SHIELD

## 2017-05-20 DIAGNOSIS — M264 Malocclusion, unspecified: Secondary | ICD-10-CM

## 2017-05-20 DIAGNOSIS — K06010 Localized gingival recession, unspecified: Secondary | ICD-10-CM

## 2017-05-20 DIAGNOSIS — K0530 Chronic periodontitis, unspecified: Secondary | ICD-10-CM

## 2017-05-20 DIAGNOSIS — K08409 Partial loss of teeth, unspecified cause, unspecified class: Secondary | ICD-10-CM

## 2017-05-20 DIAGNOSIS — K029 Dental caries, unspecified: Secondary | ICD-10-CM

## 2017-05-20 DIAGNOSIS — K045 Chronic apical periodontitis: Secondary | ICD-10-CM

## 2017-05-20 DIAGNOSIS — I2510 Atherosclerotic heart disease of native coronary artery without angina pectoris: Secondary | ICD-10-CM

## 2017-05-20 DIAGNOSIS — K083 Retained dental root: Secondary | ICD-10-CM

## 2017-05-20 DIAGNOSIS — K036 Deposits [accretions] on teeth: Secondary | ICD-10-CM

## 2017-05-20 LAB — PULMONARY FUNCTION TEST
FEF 25-75 Pre: 0.94 L/sec
FEF2575-%Pred-Pre: 40 %
FEV1-%Pred-Pre: 49 %
FEV1-Pre: 1.42 L
FEV1FVC-%Pred-Pre: 96 %
FEV6-%Pred-Pre: 52 %
FEV6-Pre: 1.89 L
FEV6FVC-%Pred-Pre: 101 %
FVC-%Pred-Pre: 52 %
FVC-Pre: 1.93 L
Pre FEV1/FVC ratio: 73 %
Pre FEV6/FVC Ratio: 97 %

## 2017-05-20 LAB — HEPARIN LEVEL (UNFRACTIONATED)
Heparin Unfractionated: 0.15 IU/mL — ABNORMAL LOW (ref 0.30–0.70)
Heparin Unfractionated: 0.37 IU/mL (ref 0.30–0.70)
Heparin Unfractionated: 0.87 IU/mL — ABNORMAL HIGH (ref 0.30–0.70)

## 2017-05-20 MED ORDER — BOOST / RESOURCE BREEZE PO LIQD CUSTOM
1.0000 | Freq: Two times a day (BID) | ORAL | Status: DC
Start: 2017-05-21 — End: 2017-05-21

## 2017-05-20 NOTE — Consult Note (Signed)
ANTICOAGULATION CONSULT NOTE   Pharmacy Consult for heparin Indication: chest pain/ACS   Allergies  Allergen Reactions  . Lisinopril Cough  . Ace Inhibitors Other (See Comments)    Has been told to avoid these and any diuretics because "left kidney has shrunk up and doesn't work"    Patient Measurements: Weight: 216 lb 8 oz (98.2 kg) Heparin Dosing Weight: 88.7kg  Vital Signs: Temp: 98.8 F (37.1 C) (04/17 0327) Temp Source: Oral (04/17 0327) BP: 123/69 (04/17 1044) Pulse Rate: 88 (04/17 0905)  Labs: Recent Labs    05/18/17 0456  05/19/17 0327 05/19/17 1130 05/19/17 1753 05/20/17 0237 05/20/17 1020  HGB 12.6  --  12.7 12.6  --   --   --   HCT 35.5  --  37.1 38.4  --   --   --   PLT 192  --  210 214  --   --   --   LABPROT 13.4  --   --   --   --   --   --   INR 1.03  --   --   --   --   --   --   HEPARINUNFRC 0.28*   < > 0.25*  --  0.13* 0.15* 0.37  CREATININE 1.17*  --   --  1.20*  --   --   --    < > = values in this interval not displayed.    Estimated Creatinine Clearance: 56.7 mL/min (A) (by C-G formula based on SCr of 1.2 mg/dL (H)).   Medications:  . heparin 1,800 Units/hr (05/20/17 0405)    Assessment:  67 year old female transferred from [facility] for CABG possibly on Friday. Pharmacy consulted to manage IV heparin drip. -heparin level now at goal after increase to 1800 units/hr  Goal of Therapy:  Heparin level 0.3-0.7 units/ml Monitor platelets by anticoagulation protocol: Yes   Plan:  No heparin changes needed F/u heparin level in 6 hours Daily heparin level and CBC  Harland GermanAndrew Sowmya Partridge, PharmD Clinical Pharmacist Clinical phone from 8:30-4:00 is [phone] After 4pm, please call Main Rx [phone]) for assistance. 05/20/2017 12:10 PM

## 2017-05-20 NOTE — Progress Notes (Addendum)
Initial Nutrition Assessment  DOCUMENTATION CODES:   Obesity unspecified  INTERVENTION:    Boost Breeze po BID, each supplement provides 250 kcal and 9 grams of protein  NUTRITION DIAGNOSIS:   Increased nutrient needs related to chronic illness as evidenced by estimated needs  GOAL:   Patient will meet greater than or equal to 90% of their needs  MONITOR:   PO intake, Supplement acceptance, Weight trends, Labs, Skin, I & O's  REASON FOR ASSESSMENT:   Malnutrition Screening Tool  ASSESSMENT:   67 year old female with PMHx of asthma, arthritis, HTN, nephrolithiasis, CKD, HLD, COPD, atrophic left kidney who presented with SOB and weakness found to have pleural effusions, possible NSTEMI, possible PNA. S/p cardiac catheterization 4/15.  Transferred from [facility] for possible CABG.  RD met with pt today at bedside. She is resting. Reports her appetite is "okay". Doesn't particularly like the hospital food. She consumed her pancakes this am and baked spaghetti for lunch.  Pt typically "grazes" throughout the day. She often times skips meals. Likes to snack on ice cream, crackers and chips. Doesn't like fruits and veggies. She is currently experiencing no nausea or vomiting.  Doesn't like Ensure Enlive. States it gave her diarrhea the other day. Amenable to trying Boost Breeze (clear liquid supplement). Labs and medications reviewed. CBG 103.  TCTS note reviewed. Plan is for CABG Friday, 4/19.  NUTRITION - FOCUSED PHYSICAL EXAM:  Completed. No muscle or fat depletion noticed.  Diet Order:  Diet heart healthy/carb modified Room service appropriate? Yes; Fluid consistency: Thin  EDUCATION NEEDS:   Not appropriate for education at this time  Skin:  Skin Assessment: Reviewed RN Assessment  Last BM:  4/16   Intake/Output Summary (Last 24 hours) at 05/20/2017 1611 Last data filed at 05/19/2017 1624 Gross per 24 hour  Intake 57.08 ml  Output -  Net 57.08 ml   Height:    Ht Readings from Last 1 Encounters:  05/16/17 _0  (1.753 m)   Weight:   Wt Readings from Last 1 Encounters:  05/20/17 216 lb 8 oz (98.2 kg)   Ideal Body Weight:  65.9 kg  BMI:  Body mass index is 31.97 kg/m.  Estimated Nutritional Needs:   Kcal:  1900-2100  Protein:  90-110 gm  Fluid:  1.9-2.1 L  Arthur Holms, RD, LDN Pager #: [phone] After-Hours Pager #: [phone]

## 2017-05-20 NOTE — Progress Notes (Addendum)
      [address]       [place] [id number]             [phone]      Subjective:  Denies chest pain, no further episodes of diarrhea.  Feels okay  Objective: Vital signs in last 24 hours: Temp:  [97.6 F (36.4 C)-98.8 F (37.1 C)] 98.8 F (37.1 C) (04/17 0327) Pulse Rate:  [85-95] 94 (04/17 0758) Cardiac Rhythm: Normal sinus rhythm (04/17 0801) Resp:  [16-24] 24 (04/16 2045) BP: (129-139)/(61-78) 129/65 (04/17 0758) SpO2:  [96 %-100 %] 96 % (04/16 2008) Weight:  [216 lb 8 oz (98.2 kg)] 216 lb 8 oz (98.2 kg) (04/17 0327)  Intake/Output from previous day: 04/16 0701 - 04/17 0700 In: 57.1 [I.V.:57.1] Out: -   General appearance: alert, cooperative and no distress Heart: regular rate and rhythm Lungs: clear to auscultation bilaterally Abdomen: soft, non-tender; bowel sounds normal; no masses,  no organomegaly Extremities: extremities normal, atraumatic, no cyanosis or edema  Lab Results: Recent Labs    05/19/17 0327 05/19/17 1130  WBC 9.3 10.7*  HGB 12.7 12.6  HCT 37.1 38.4  PLT 210 214   BMET:  Recent Labs    05/18/17 0456 05/19/17 1130  NA 140 140  K 2.9* 3.5  CL 107 106  CO2 24 23  GLUCOSE 93 99  BUN 30* 23*  CREATININE 1.17* 1.20*  CALCIUM 8.0* 8.6*    PT/INR:  Recent Labs    05/18/17 0456  LABPROT 13.4  INR 1.03   ABG No results found for: PHART, HCO3, TCO2, ACIDBASEDEF, O2SAT CBG (last 3)  Recent Labs    05/18/17 0833  GLUCAP 103*    Assessment/Plan: S/P Procedure(s) (LRB): CORONARY ARTERY BYPASS GRAFTING (CABG) (N/A) TRANSESOPHAGEAL ECHOCARDIOGRAM (TEE) (N/A)  1. CV- CAD, needs CABG for LM disease, continue Coreg, home antihypertensive agents 2. Pulm- completing steroid taper, continue IS 3. Renal- baseline CKD, creatinine currently at 1.20, will monitor 4. ID- no further diarrhea, stool sample has not been sent, will collect when able 5. Dispo- patient stable, chest pain free, continue medical management  for now, tentative OR Friday   LOS: 1 day    Erin Barrett 05/20/2017  diarrhea resolved  Chronic Kidney Disease   Stage I     GFR >90  Stage II    GFR 60-89  Stage IIIA GFR 45-59  Stage IIIB GFR 30-44  Stage IV   GFR 15-29  Stage V    GFR  <15  Lab Results  Component Value Date   CREATININE 1.20 (H) 05/19/2017   Estimated Creatinine Clearance: 56.7 mL/min (A) (by C-G formula based on SCr of 1.2 mg/dL (H)).  Plan CABG Friday - patient seen and I have discussed with her proceeding with CABG I have seen and examined Audrey Wolf and agree with the above assessment  and plan.  Delight OvensEdward B Sherl Yzaguirre MD [facility] [phone] Office [phone] 05/20/2017 2:36 PM

## 2017-05-20 NOTE — Plan of Care (Signed)
  Problem: Education: Goal: Knowledge of General Education information will improve Outcome: Progressing   Problem: Health Behavior/Discharge Planning: Goal: Ability to manage health-related needs will improve Outcome: Progressing   Problem: Clinical Measurements: Goal: Ability to maintain clinical measurements within normal limits will improve Outcome: Progressing   

## 2017-05-20 NOTE — Consult Note (Signed)
DENTAL CONSULTATION  Date of Consultation:  05/20/2017 Patient Name:   Audrey Wolf Date of Birth:   May 13, 1950 Medical Record Number: [id number]  VITALS: BP 123/69   Pulse 88   Temp 98.8 F (37.1 C) (Oral)   Resp 18   Wt 216 lb 8 oz (98.2 kg)   SpO2 95%   BMI 31.97 kg/m   CHIEF COMPLAINT: Patient was referred by Dr. Dorris Fetch for dental consultation.  HPI: Audrey Wolf is a 67 year old female recently diagnosed with coronary artery disease.  Patient with anticipated coronary artery bypass graft procedure with Dr. Tyrone Sage in the near future.  Patient is now seen to rule out dental infection that may affect the patient's systemic health and anticipated heart surgery.  The patient currently denies acute toothaches, swellings, or abscesses.  Patient has not seen a dentist in a long time.  She does not seek regular dental care due to economic concerns.  Patient last saw Dr. Jarold Motto in [place], [place] for fabrication of an upper complete denture.  Patient indicates that the upper denture fits fine.  Patient does not have a lower partial denture.  Patient denies having dental phobia.  Patient knows that she needs her remaining lower teeth extracted.  Patient patient is adamant about not wanting dental extractions at this time.  PROBLEM LIST: Patient Active Problem List   Diagnosis Date Noted  . CAD (coronary artery disease), native coronary artery 05/19/2017  . CAD (coronary artery disease) 05/19/2017  . Pleural effusion 05/16/2017  . Elevated troponin I level 05/16/2017  . NSTEMI (non-ST elevated myocardial infarction) (HCC) 05/16/2017  . CHF (congestive heart failure) (HCC) 05/16/2017  . SOB (shortness of breath) 04/20/2017  . Bronchitis 04/20/2017  . Periumbilical abdominal pain 10/23/2016  . Hypertension 06/05/2016  . Hyperlipidemia 06/05/2016  . GERD (gastroesophageal reflux disease) 06/05/2016  . CKD (chronic kidney disease) stage 2, GFR 60-89 ml/min  06/05/2016  . Ureteral stricture, left 03/29/2014  . Unilateral small kidney 03/02/2013    PMH: Past Medical History:  Diagnosis Date  . Arthritis   . Asthma   . CHF (congestive heart failure) (HCC)   . Chronic kidney disease   . Coronary artery disease   . GERD (gastroesophageal reflux disease)   . Headache   . Hypertension   . Nephrolithiasis   . NSTEMI (non-ST elevated myocardial infarction) (HCC)     PSH: Past Surgical History:  Procedure Laterality Date  . ABDOMINAL HYSTERECTOMY    . APPENDECTOMY    . BREAST BIOPSY Left 12/31/2010   neg  . LEFT HEART CATH AND CORONARY ANGIOGRAPHY N/A 05/18/2017   Procedure: LEFT HEART CATH AND CORONARY ANGIOGRAPHY;  Surgeon: Laurier Nancy, MD;  Location: [facility] INVASIVE CV LAB;  Service: Cardiovascular;  Laterality: N/A;  . LITHOTRIPSY    . TONSILLECTOMY      ALLERGIES: Allergies  Allergen Reactions  . Lisinopril Cough  . Ace Inhibitors Other (See Comments)    Has been told to avoid these and any diuretics because "left kidney has shrunk up and doesn't work"    MEDICATIONS: Current Facility-Administered Medications  Medication Dose Route Frequency Provider Last Rate Last Dose  . acetaminophen (TYLENOL) tablet 500 mg  500 mg Oral Daily Jari Favre N, PA-C   500 mg at 05/20/17 1044  . albuterol (PROVENTIL) (2.5 MG/3ML) 0.083% nebulizer solution 3 mL  3 mL Inhalation Q6H PRN Conte, Tessa N, PA-C      . amLODipine (NORVASC) tablet 10 mg  10 mg  Oral Daily Sharlene Dory, PA-C   10 mg at 05/20/17 1047  . aspirin EC tablet 81 mg  81 mg Oral Daily Sharlene Dory, PA-C   81 mg at 05/20/17 1046  . azithromycin (ZITHROMAX) tablet 250 mg  250 mg Oral Daily Jari Favre N, PA-C   250 mg at 05/20/17 1047  . carvedilol (COREG) tablet 3.125 mg  3.125 mg Oral BID WC Asa Lente, Tessa N, PA-C   3.125 mg at 05/20/17 0758  . [START ON 05/23/2017] cloNIDine (CATAPRES - Dosed in mg/24 hr) patch 0.3 mg  0.3 mg Transdermal Weekly Conte, Tessa N, PA-C      .  famotidine (PEPCID) tablet 10 mg  10 mg Oral Daily Conte, Tessa N, PA-C   10 mg at 05/20/17 1045  . feeding supplement (ENSURE ENLIVE) (ENSURE ENLIVE) liquid 237 mL  237 mL Oral BID BM Delight Ovens, MD      . furosemide (LASIX) tablet 20 mg  20 mg Oral Daily Conte, Tessa N, PA-C   20 mg at 05/20/17 1046  . heparin ADULT infusion 100 units/mL (25000 units/259mL sodium chloride 0.45%)  1,800 Units/hr Intravenous Continuous Delight Ovens, MD 18 mL/hr at 05/20/17 0405 1,800 Units/hr at 05/20/17 0405  . hydrALAZINE (APRESOLINE) tablet 100 mg  100 mg Oral TID Asa Lente, Tessa N, PA-C   100 mg at 05/20/17 1047  . loperamide (IMODIUM) capsule 2 mg  2 mg Oral PRN Jari Favre N, PA-C   2 mg at 05/19/17 1417  . loratadine (CLARITIN) tablet 10 mg  10 mg Oral Daily Jari Favre N, PA-C   10 mg at 05/20/17 1044  . losartan (COZAAR) tablet 25 mg  25 mg Oral Daily Jari Favre N, PA-C   25 mg at 05/20/17 1044  . mometasone-formoterol (DULERA) 100-5 MCG/ACT inhaler 2 puff  2 puff Inhalation BID Sharlene Dory, PA-C   2 puff at 05/20/17 1610  . nitroGLYCERIN (NITROSTAT) SL tablet 0.4 mg  0.4 mg Sublingual Q5 Min x 3 PRN Conte, Tessa N, PA-C      . ondansetron (ZOFRAN) injection 4 mg  4 mg Intravenous Q6H PRN Asa Lente, Tessa N, PA-C      . [START ON 05/21/2017] predniSONE (DELTASONE) tablet 10 mg  10 mg Oral Q breakfast Conte, Tessa N, [place]      . rosuvastatin (CRESTOR) tablet 20 mg  20 mg Oral QHS Jari Favre N, [place]   20 mg at 05/19/17 2137    LABS: Lab Results  Component Value Date   WBC 10.7 (H) 05/19/2017   HGB 12.6 05/19/2017   HCT 38.4 05/19/2017   MCV 92.8 05/19/2017   PLT 214 05/19/2017      Component Value Date/Time   NA 140 05/19/2017 1130   NA 141 04/03/2016   NA 140 03/01/2012 1132   K 3.5 05/19/2017 1130   K 3.5 03/01/2012 1132   CL 106 05/19/2017 1130   CL 108 (H) 03/01/2012 1132   CO2 23 05/19/2017 1130   CO2 23 03/01/2012 1132   GLUCOSE 99 05/19/2017 1130   GLUCOSE 92  03/01/2012 1132   BUN 23 (H) 05/19/2017 1130   BUN 23 (A) 04/03/2016   BUN 22 (H) 03/01/2012 1132   CREATININE 1.20 (H) 05/19/2017 1130   CREATININE 0.99 03/01/2012 1132   CALCIUM 8.6 (L) 05/19/2017 1130   CALCIUM 8.6 03/01/2012 1132   GFRNONAA 46 (L) 05/19/2017 1130   GFRNONAA >60 03/01/2012 1132   GFRAA 53 (L) 05/19/2017  1130   GFRAA >60 03/01/2012 1132   Lab Results  Component Value Date   INR 1.03 05/18/2017   INR 1.00 05/16/2017   No results found for: PTT  SOCIAL HISTORY: Social History   Socioeconomic History  . Marital status: Single    Spouse name: Not on file  . Number of children: Not on file  . Years of education: Not on file  . Highest education level: Not on file  Occupational History    Employer: [employer]  Social Needs  . Financial resource strain: Not on file  . Food insecurity:    Worry: Not on file    Inability: Not on file  . Transportation needs:    Medical: Not on file    Non-medical: Not on file  Tobacco Use  . Smoking status: Never Smoker  . Smokeless tobacco: Never Used  Substance and Sexual Activity  . Alcohol use: No  . Drug use: No  . Sexual activity: Never    Partners: Male  Lifestyle  . Physical activity:    Days per week: Not on file    Minutes per session: Not on file  . Stress: Not on file  Relationships  . Social connections:    Talks on phone: Not on file    Gets together: Not on file    Attends religious service: Not on file    Active member of club or organization: Not on file    Attends meetings of clubs or organizations: Not on file    Relationship status: Not on file  . Intimate partner violence:    Fear of current or ex partner: Not on file    Emotionally abused: Not on file    Physically abused: Not on file    Forced sexual activity: Not on file  Other Topics Concern  . Not on file  Social History Narrative  . Not on file    FAMILY HISTORY: Family History  Problem Relation Age of Onset  . Arthritis  Mother   . Arthritis Father   . Heart disease Father   . Stroke Father   . Sudden Cardiac Death Father   . Breast cancer Maternal Aunt     REVIEW OF SYSTEMS: Reviewed with the patient as per History of present illness. Psych: Patient denies having dental phobia.  DENTAL HISTORY: CHIEF COMPLAINT: Patient was referred by Dr. Dorris FetchHendrickson for dental consultation.  HPI: Audrey Wolf is a 67 year old female recently diagnosed with coronary artery disease.  Patient with anticipated coronary artery bypass graft procedure with Dr. Tyrone SageGerhardt in the near future.  Patient is now seen to rule out dental infection that may affect the patient's systemic health and anticipated heart surgery.  The patient currently denies acute toothaches, swellings, or abscesses.  Patient has not seen a dentist in a long time.  She does not seek regular dental care due to economic concerns.  Patient last saw Dr. Jarold Mot[place]Patterson in [place] for fabrication of an upper complete denture.  Patient indicates that the upper denture fits fine.  Patient does not have a lower partial denture.  Patient denies having dental phobia.  Patient knows that she needs her remaining lower teeth extracted.  Patient patient is adamant about not wanting dental extractions at this time.  DENTAL EXAMINATION: GENERAL: The patient is a well-developed, well-nourished female in no acute distress. HEAD AND NECK: There is no palpable neck lymphadenopathy.  The patient denies acute TMJ symptoms. INTRAORAL EXAM: The patient has normal  saliva.  There is no evidence of oral abscess formation.  The pre-maxilla has flabby tissues associated with it.   DENTITION: Patient has an edentulous maxilla.  Patient with multiple missing lower teeth with the exception of tooth numbers 21, 22, 23, 25, 26, 27, and 28.  Most of these lower teeth are retained root segments.     PERIODONTAL: The patient is chronic periodontitis with plaque calculus  accumulations, generalized gingival recession, generalized tooth mobility of remaining teeth. DENTAL CARIES/SUBOPTIMAL RESTORATIONS: All remaining lower teeth are affected by dental caries. ENDODONTIC: Patient currently denies acute pulpitis symptoms.  There are multiple areas of periapical pathology and radiolucency. CROWN AND BRIDGE: There are no crown or bruits restorations noted. PROSTHODONTIC: Patient has a maxillary complete denture that is less than ideal but is acceptable.  There is no lower partial denture. OCCLUSION: Patient is a poor occlusal scheme secondary to multiple missing teeth, multiple retained root segments, and lack replacement of missing teeth with clinically acceptable dental prostheses.  RADIOGRAPHIC INTERPRETATION: Orthopantogram was taken on 05/20/2017. There are multiple missing teeth.  The patient has an edentulous maxilla.  Tooth numbers 21, 22, 23, 25, 26, 27, 28 are present as retained root segments.  There are multiple areas of cervical pathology and radiolucency.  All remaining teeth are affected by dental caries.  There is moderate to severe bone loss noted.   ASSESSMENTS: 1.  Coronary artery disease with anticipated coronary artery bypass graft procedure in the near future.   2.  Chronic apical periodontitis 3.  Multiple retained root segments. 4.  Rampant dental caries 5.  Chronic periodontitis of bone loss 6.  Accretions 7.  Generalized gingival recession 8.  Generalized tooth mobility 9.  Multiple missing teeth 10.  Upper complete denture that is less than ideal but acceptable by patient report 11.  Poor occlusal scheme and malocclusion   PLAN/RECOMMENDATIONS: 1. I discussed the risks, benefits, and complications of various treatment options with the patient in relationship to her medical and dental conditions, anticipated coronary artery bypass graft procedure, and risk for endocarditis. We discussed various treatment options to include no treatment,  multiple extractions with alveoloplasty, pre-prosthetic surgery as indicated, implant therapy, and replacement of missing teeth as indicated. The patient currently is adamant about not wanting dental extractions at this time.  Patient wishes to proceed with coronary artery bypass graft surgery with Dr. Tyrone Sage this Friday and then will consider dental treatment when she is stable from that procedure.  Patient is aware of the risk for significant systemic infection from the remaining lower retained root segments and apical infection.  Patient will contact Dental Medicine when she wishes to proceed with dental extraction procedures or alternatively will contact her primary dentist, Dr. Jarold Motto for treatment in [place], [place].   2. Discussion of findings with medical team and coordination of future medical and dental care as needed.    Charlynne Pander, DDS

## 2017-05-20 NOTE — Progress Notes (Signed)
Discussed sternal precaution/move in the tube, IS (750 mL), mobility post op, and d/c planning with pt. Voiced understanding but she became emotionally when I told her driving restrictions. Apparently she enjoys being independent. She lives alone but her son lives 5 min away and does not work. I encouraged her to discuss with him assistance at d/c. She does st that he will go to store for her. Gave her materials to review. Encouraged her to be up ad lib but to report any chest tightness to RN. [phone] Ethelda ChickKristan Mikala Podoll CES, ACSM 10:27 AM 05/20/2017

## 2017-05-20 NOTE — Plan of Care (Signed)
  Problem: Education: Goal: Knowledge of General Education information will improve Outcome: Progressing   Problem: Health Behavior/Discharge Planning: Goal: Ability to manage health-related needs will improve Outcome: Progressing   Problem: Clinical Measurements: Goal: Diagnostic test results will improve Outcome: Progressing   

## 2017-05-20 NOTE — Consult Note (Signed)
ANTICOAGULATION CONSULT NOTE   Pharmacy Consult for heparin Indication: chest pain/ACS   Allergies  Allergen Reactions  . Lisinopril Cough  . Ace Inhibitors Other (See Comments)    Has been told to avoid these and any diuretics because "left kidney has shrunk up and doesn't work"    Patient Measurements: Weight: 216 lb 8 oz (98.2 kg) Heparin Dosing Weight: 88.7kg  Vital Signs: Temp: 98.8 F (37.1 C) (04/17 0327) Temp Source: Oral (04/17 0327) BP: 135/73 (04/17 0327) Pulse Rate: 95 (04/16 2008)  Labs: Recent Labs    05/17/17 0519 05/18/17 0456  05/19/17 0327 05/19/17 1130 05/19/17 1753 05/20/17 0237  HGB 13.6 12.6  --  12.7 12.6  --   --   HCT 39.7 35.5  --  37.1 38.4  --   --   PLT 250 192  --  210 214  --   --   LABPROT  --  13.4  --   --   --   --   --   INR  --  1.03  --   --   --   --   --   HEPARINUNFRC 0.43 0.28*   < > 0.25*  --  0.13* 0.15*  CREATININE 1.17* 1.17*  --   --  1.20*  --   --    < > = values in this interval not displayed.    Estimated Creatinine Clearance: 56.7 mL/min (A) (by C-G formula based on SCr of 1.2 mg/dL (H)).   Medications:  . heparin 1,800 Units/hr (05/20/17 0405)    Assessment:  67 [facility]. Pharmacy consulted to manage IV heparin drip.  Heparin level remains subtherapeutic at 0.15 on 1550 units/hr. No issues noted with infusion per RN  Goal of Therapy:  Heparin level 0.3-0.7 units/ml Monitor platelets by anticoagulation protocol: Yes   Plan:  Increase heparin drip to 1800 units/hr F/u heparin level in 6 hours Monitor for s/sx of bleeding   Thanks for allowing pharmacy to be a part of this patient's care.  Talbert CageLora Antoria Lanza, PharmD Clinical Pharmacist

## 2017-05-20 NOTE — Consult Note (Signed)
ANTICOAGULATION CONSULT NOTE   Pharmacy Consult for heparin Indication: chest pain/ACS   Allergies  Allergen Reactions  . Lisinopril Cough  . Ace Inhibitors Other (See Comments)    Has been told to avoid these and any diuretics because "left kidney has shrunk up and doesn't work"    Patient Measurements: Weight: 216 lb 8 oz (98.2 kg) Heparin Dosing Weight: 88.7kg  Vital Signs: Temp: 98 F (36.7 C) (04/17 1658) Temp Source: Oral (04/17 1658) BP: 140/96 (04/17 1700) Pulse Rate: 88 (04/17 0905)  Labs: Recent Labs    05/18/17 0456  05/19/17 0327 05/19/17 1130  05/20/17 0237 05/20/17 1020 05/20/17 1605  HGB 12.6  --  12.7 12.6  --   --   --   --   HCT 35.5  --  37.1 38.4  --   --   --   --   PLT 192  --  210 214  --   --   --   --   LABPROT 13.4  --   --   --   --   --   --   --   INR 1.03  --   --   --   --   --   --   --   HEPARINUNFRC 0.28*   < > 0.25*  --    < > 0.15* 0.37 0.87*  CREATININE 1.17*  --   --  1.20*  --   --   --   --    < > = values in this interval not displayed.    Estimated Creatinine Clearance: 56.7 mL/min (A) (by C-G formula based on SCr of 1.2 mg/dL (H)).   Medications:  . heparin 1,800 Units/hr (05/20/17 1444)    Assessment:  67 [facility]. Pharmacy consulted to manage IV heparin drip.  Heparin level elevated at 0.87  Goal of Therapy:  Heparin level 0.3-0.7 units/ml Monitor platelets by anticoagulation protocol: Yes   Plan:  Heparin to 1650 units / hr Daily heparin level and CBC  Thank you Okey RegalLisa Anvita Hirata, PharmD [phone] 05/20/2017 5:06 PM

## 2017-05-21 ENCOUNTER — Inpatient Hospital Stay (HOSPITAL_COMMUNITY): Payer: BLUE CROSS/BLUE SHIELD

## 2017-05-21 DIAGNOSIS — Z01810 Encounter for preprocedural cardiovascular examination: Secondary | ICD-10-CM

## 2017-05-21 LAB — URINALYSIS, ROUTINE W REFLEX MICROSCOPIC
Bilirubin Urine: NEGATIVE
Glucose, UA: NEGATIVE mg/dL
Hgb urine dipstick: NEGATIVE
Ketones, ur: NEGATIVE mg/dL
Leukocytes, UA: NEGATIVE
Nitrite: NEGATIVE
Protein, ur: NEGATIVE mg/dL
Specific Gravity, Urine: 1.015 (ref 1.005–1.030)
pH: 6 (ref 5.0–8.0)

## 2017-05-21 LAB — CBC
HCT: 38.3 % (ref 36.0–46.0)
Hemoglobin: 12.8 g/dL (ref 12.0–15.0)
MCH: 30.4 pg (ref 26.0–34.0)
MCHC: 33.4 g/dL (ref 30.0–36.0)
MCV: 91 fL (ref 78.0–100.0)
Platelets: 237 K/uL (ref 150–400)
RBC: 4.21 MIL/uL (ref 3.87–5.11)
RDW: 14.6 % (ref 11.5–15.5)
WBC: 11.7 K/uL — ABNORMAL HIGH (ref 4.0–10.5)

## 2017-05-21 LAB — PROTIME-INR
INR: 1.08
Prothrombin Time: 13.9 seconds (ref 11.4–15.2)

## 2017-05-21 LAB — BLOOD GAS, ARTERIAL
Acid-Base Excess: 1.3 mmol/L (ref 0.0–2.0)
Bicarbonate: 23.7 mmol/L (ref 20.0–28.0)
Drawn by: 244851
O2 Saturation: 96.8 %
Patient temperature: 98.6
pCO2 arterial: 27.2 mmHg — ABNORMAL LOW (ref 32.0–48.0)
pH, Arterial: 7.548 — ABNORMAL HIGH (ref 7.350–7.450)
pO2, Arterial: 81.3 mmHg — ABNORMAL LOW (ref 83.0–108.0)

## 2017-05-21 LAB — C DIFFICILE QUICK SCREEN W PCR REFLEX
C DIFFICILE (CDIFF) INTERP: NOT DETECTED
C DIFFICLE (CDIFF) ANTIGEN: NEGATIVE
C Diff toxin: NEGATIVE

## 2017-05-21 LAB — COMPREHENSIVE METABOLIC PANEL WITH GFR
ALT: 31 U/L (ref 14–54)
AST: 34 U/L (ref 15–41)
Albumin: 3.3 g/dL — ABNORMAL LOW (ref 3.5–5.0)
Alkaline Phosphatase: 65 U/L (ref 38–126)
Anion gap: 12 (ref 5–15)
BUN: 20 mg/dL (ref 6–20)
CO2: 22 mmol/L (ref 22–32)
Calcium: 8.3 mg/dL — ABNORMAL LOW (ref 8.9–10.3)
Chloride: 105 mmol/L (ref 101–111)
Creatinine, Ser: 1.22 mg/dL — ABNORMAL HIGH (ref 0.44–1.00)
GFR calc Af Amer: 52 mL/min — ABNORMAL LOW
GFR calc non Af Amer: 45 mL/min — ABNORMAL LOW
Glucose, Bld: 92 mg/dL (ref 65–99)
Potassium: 2.8 mmol/L — ABNORMAL LOW (ref 3.5–5.1)
Sodium: 139 mmol/L (ref 135–145)
Total Bilirubin: 0.9 mg/dL (ref 0.3–1.2)
Total Protein: 5.6 g/dL — ABNORMAL LOW (ref 6.5–8.1)

## 2017-05-21 LAB — CULTURE, BLOOD (ROUTINE X 2)
CULTURE: NO GROWTH
CULTURE: NO GROWTH
Special Requests: ADEQUATE
Special Requests: ADEQUATE

## 2017-05-21 LAB — HEPARIN LEVEL (UNFRACTIONATED)
Heparin Unfractionated: 1.03 [IU]/mL — ABNORMAL HIGH (ref 0.30–0.70)
Heparin Unfractionated: 0.64 [IU]/mL (ref 0.30–0.70)

## 2017-05-21 LAB — ABO/RH: ABO/RH(D): A POS

## 2017-05-21 LAB — TYPE AND SCREEN
ABO/RH(D): A POS
Antibody Screen: NEGATIVE

## 2017-05-21 LAB — APTT: aPTT: 70 seconds — ABNORMAL HIGH (ref 24–36)

## 2017-05-21 MED ORDER — METOPROLOL TARTRATE 12.5 MG HALF TABLET
12.5000 mg | ORAL_TABLET | Freq: Once | ORAL | Status: AC
  Administered 2017-05-22: 12.5 mg via ORAL
  Filled 2017-05-21: qty 1

## 2017-05-21 MED ORDER — SODIUM CHLORIDE 0.9 % IV SOLN
1.5000 g | INTRAVENOUS | Status: AC
  Administered 2017-05-22: 1.5 g via INTRAVENOUS
  Filled 2017-05-21: qty 1.5

## 2017-05-21 MED ORDER — SODIUM CHLORIDE 0.9 % IV SOLN
30.0000 ug/min | INTRAVENOUS | Status: DC
  Filled 2017-05-21: qty 2

## 2017-05-21 MED ORDER — DOPAMINE-DEXTROSE 3.2-5 MG/ML-% IV SOLN
0.0000 ug/kg/min | INTRAVENOUS | Status: DC
  Filled 2017-05-21: qty 250

## 2017-05-21 MED ORDER — HEPARIN SODIUM (PORCINE) 1000 UNIT/ML IJ SOLN
INTRAMUSCULAR | Status: DC
  Filled 2017-05-21: qty 30

## 2017-05-21 MED ORDER — TEMAZEPAM 7.5 MG PO CAPS: 15.0000 mg | ORAL_CAPSULE | Freq: Once | ORAL | Status: DC | PRN

## 2017-05-21 MED ORDER — PLASMA-LYTE 148 IV SOLN
INTRAVENOUS | Status: DC
  Filled 2017-05-21: qty 2.5

## 2017-05-21 MED ORDER — POTASSIUM CHLORIDE 2 MEQ/ML IV SOLN
80.0000 meq | INTRAVENOUS | Status: DC
  Filled 2017-05-21: qty 40

## 2017-05-21 MED ORDER — VANCOMYCIN HCL 10 G IV SOLR
1500.0000 mg | INTRAVENOUS | Status: AC
  Administered 2017-05-22: 1500 mg via INTRAVENOUS
  Filled 2017-05-21: qty 1500

## 2017-05-21 MED ORDER — TRANEXAMIC ACID (OHS) PUMP PRIME SOLUTION
2.0000 mg/kg | INTRAVENOUS | Status: DC
  Filled 2017-05-21: qty 1.96

## 2017-05-21 MED ORDER — MAGNESIUM SULFATE 50 % IJ SOLN
40.0000 meq | INTRAMUSCULAR | Status: DC
  Filled 2017-05-21: qty 9.85

## 2017-05-21 MED ORDER — SODIUM CHLORIDE 0.9 % IV SOLN
750.0000 mg | INTRAVENOUS | Status: AC
  Administered 2017-05-22: 750 mg via INTRAVENOUS
  Filled 2017-05-21: qty 750

## 2017-05-21 MED ORDER — CHLORHEXIDINE GLUCONATE 0.12 % MT SOLN
15.0000 mL | Freq: Once | OROMUCOSAL | Status: AC
  Administered 2017-05-22: 15 mL via OROMUCOSAL
  Filled 2017-05-21: qty 15

## 2017-05-21 MED ORDER — POTASSIUM CHLORIDE CRYS ER 20 MEQ PO TBCR
20.0000 meq | EXTENDED_RELEASE_TABLET | Freq: Three times a day (TID) | ORAL | Status: DC
  Administered 2017-05-21 (×3): 20 meq via ORAL
  Filled 2017-05-21: qty 1
  Filled 2017-05-21: qty 2
  Filled 2017-05-21: qty 1

## 2017-05-21 MED ORDER — EPINEPHRINE PF 1 MG/ML IJ SOLN
0.0000 ug/min | INTRAVENOUS | Status: DC
  Filled 2017-05-21: qty 4

## 2017-05-21 MED ORDER — CHLORHEXIDINE GLUCONATE CLOTH 2 % EX PADS
6.0000 | MEDICATED_PAD | Freq: Once | CUTANEOUS | Status: AC
  Administered 2017-05-21: 6 via TOPICAL

## 2017-05-21 MED ORDER — SODIUM CHLORIDE 0.9 % IV SOLN
INTRAVENOUS | Status: DC
  Filled 2017-05-21: qty 1

## 2017-05-21 MED ORDER — CHLORHEXIDINE GLUCONATE CLOTH 2 % EX PADS: 6.0000 | MEDICATED_PAD | Freq: Once | CUTANEOUS | Status: DC

## 2017-05-21 MED ORDER — TRANEXAMIC ACID 1000 MG/10ML IV SOLN
1.5000 mg/kg/h | INTRAVENOUS | Status: AC
  Administered 2017-05-22: 1.5 mg/kg/h via INTRAVENOUS
  Filled 2017-05-21: qty 25

## 2017-05-21 MED ORDER — DEXMEDETOMIDINE HCL IN NACL 400 MCG/100ML IV SOLN
0.1000 ug/kg/h | INTRAVENOUS | Status: DC
  Filled 2017-05-21: qty 100

## 2017-05-21 MED ORDER — TRANEXAMIC ACID (OHS) BOLUS VIA INFUSION
15.0000 mg/kg | INTRAVENOUS | Status: AC
  Administered 2017-05-22: 1470 mg via INTRAVENOUS

## 2017-05-21 MED ORDER — MILRINONE LACTATE IN DEXTROSE 20-5 MG/100ML-% IV SOLN
0.1250 ug/kg/min | INTRAVENOUS | Status: DC
  Filled 2017-05-21: qty 100

## 2017-05-21 MED ORDER — NITROGLYCERIN IN D5W 200-5 MCG/ML-% IV SOLN
2.0000 ug/min | INTRAVENOUS | Status: AC
  Administered 2017-05-22: 16.6 ug/min via INTRAVENOUS
  Filled 2017-05-21: qty 250

## 2017-05-21 NOTE — Progress Notes (Signed)
Late entry- came to room x 2 for MDI. Pt not available, RN aware- Spoke w/ RN.

## 2017-05-21 NOTE — Anesthesia Preprocedure Evaluation (Addendum)
Anesthesia Evaluation  Patient identified by MRN, date of birth, ID band Patient awake    Reviewed: Allergy & Precautions, NPO status , Patient's Chart, lab work & pertinent test results, reviewed documented beta blocker date and time   Airway Mallampati: III  TM Distance: >3 FB Neck ROM: Full    Dental  (+) Dental Advisory Given   Pulmonary asthma ,    breath sounds clear to auscultation       Cardiovascular hypertension, Pt. on medications + CAD, + Past MI and +CHF   Rhythm:Regular Rate:Normal  Borderline LV function with anteroseptal hypokinesis due to CAD,  and mild MR/TR. EF 50%   Neuro/Psych  Headaches,    GI/Hepatic Neg liver ROS, GERD  Medicated,  Endo/Other  negative endocrine ROS  Renal/GU CRFRenal disease     Musculoskeletal  (+) Arthritis ,   Abdominal   Peds  Hematology negative hematology ROS (+)   Anesthesia Other Findings   Reproductive/Obstetrics                            Lab Results  Component Value Date   WBC 11.7 (H) 05/21/2017   HGB 12.8 05/21/2017   HCT 38.3 05/21/2017   MCV 91.0 05/21/2017   PLT 237 05/21/2017   Lab Results  Component Value Date   CREATININE 1.22 (H) 05/21/2017   BUN 20 05/21/2017   NA 139 05/21/2017   K 2.8 (L) 05/21/2017   CL 105 05/21/2017   CO2 22 05/21/2017   Lab Results  Component Value Date   INR 1.08 05/21/2017   INR 1.03 05/18/2017   INR 1.00 05/16/2017    Anesthesia Physical Anesthesia Plan  ASA: IV  Anesthesia Plan: General   Post-op Pain Management:    Induction: Intravenous  PONV Risk Score and Plan: 3 and Midazolam, Dexamethasone, Ondansetron and Treatment may vary due to age or medical condition  Airway Management Planned: Oral ETT  Additional Equipment: Arterial line, CVP, PA Cath, TEE and Ultrasound Guidance Line Placement  Intra-op Plan:   Post-operative Plan: Post-operative  intubation/ventilation  Informed Consent: I have reviewed the patients History and Physical, chart, labs and discussed the procedure including the risks, benefits and alternatives for the proposed anesthesia with the patient or authorized representative who has indicated his/her understanding and acceptance.   Dental advisory given  Plan Discussed with: CRNA  Anesthesia Plan Comments:        Anesthesia Quick Evaluation

## 2017-05-21 NOTE — Consult Note (Signed)
ANTICOAGULATION CONSULT NOTE   Pharmacy Consult for heparin Indication: chest pain/ACS   Allergies  Allergen Reactions  . Lisinopril Cough  . Ace Inhibitors Other (See Comments)    Has been told to avoid these and any diuretics because "left kidney has shrunk up and doesn't work"    Patient Measurements: Height: 5\' 9"  (175.3 cm) Weight: 216 lb (98 kg) IBW/kg (Calculated) : 66.2 Heparin Dosing Weight: 88.7kg  Vital Signs: Temp: 98.2 F (36.8 C) (04/18 0752) Temp Source: Oral (04/18 0752) BP: 133/56 (04/18 0752) Pulse Rate: 85 (04/18 0752)  Labs: Recent Labs    05/19/17 0327 05/19/17 1130  05/20/17 1605 05/21/17 0216 05/21/17 0958  HGB 12.7 12.6  --   --  12.8  --   HCT 37.1 38.4  --   --  38.3  --   PLT 210 214  --   --  237  --   LABPROT  --   --   --   --  13.9  --   INR  --   --   --   --  1.08  --   HEPARINUNFRC 0.25*  --    < > 0.87* 1.03* 0.64  CREATININE  --  1.20*  --   --  1.22*  --    < > = values in this interval not displayed.    Estimated Creatinine Clearance: 55.7 mL/min (A) (by C-G formula based on SCr of 1.22 mg/dL (H)).   Medications:  . heparin 1,250 Units/hr (05/21/17 0600)    Assessment:  67 [facility]. Pharmacy consulted to manage IV heparin drip. -heparin level now at goal after decrease to 1250 units/hr  Goal of Therapy:  Heparin level 0.3-0.7 units/ml Monitor platelets by anticoagulation protocol: Yes   Plan:  No heparin changes needed Daily heparin level and CBC  Harland GermanAndrew Hanish Laraia, PharmD Clinical Pharmacist Clinical phone from 8:30-4:00 is [phone] After 4pm, please call Main Rx [phone]) for assistance. 05/21/2017 11:48 AM

## 2017-05-21 NOTE — Progress Notes (Signed)
Patient on enteric precautions.  High-touch surfaces wiped down with bleach wipes.

## 2017-05-21 NOTE — Plan of Care (Signed)
  Problem: Education: Goal: Knowledge of General Education information will improve Outcome: Progressing   Problem: Health Behavior/Discharge Planning: Goal: Ability to manage health-related needs will improve Outcome: Progressing   Problem: Clinical Measurements: Goal: Ability to maintain clinical measurements within normal limits will improve Outcome: Progressing   

## 2017-05-21 NOTE — Progress Notes (Signed)
Pre-op Cardiac Surgery  Carotid Findings:  Bilateral approximately 1-39% ICA stenosis. Antegrade flow of vertebral arteries bilaterally. Intimal thickening both R/L CCA distal to ICA prox. Elevated velocity in right distal ICA maybe due to vessel tortuosity.   Upper Extremity Right Left  Brachial Pressures 155 155  Radial Waveforms biphasic biphasic  Ulnar Waveforms biphasic biphasic  Palmar Arch (Allen's Test) patent    Findings:      Lower  Extremity Right Left  Dorsalis Pedis 158 150  Anterior Tibial    Posterior Tibial 150 186  Ankle/Brachial Indices 1.02 1.20    Audrey Wolf (RDMS RVT) 05/21/17 10:10 AM

## 2017-05-21 NOTE — Progress Notes (Signed)
ANTICOAGULATION CONSULT NOTE - Follow Up Consult  Pharmacy Consult for heparin Indication: CAD awaiting CABG  Labs: Recent Labs    05/18/17 0456  05/19/17 0327 05/19/17 1130  05/20/17 1020 05/20/17 1605 05/21/17 0216  HGB 12.6  --  12.7 12.6  --   --   --  12.8  HCT 35.5  --  37.1 38.4  --   --   --  38.3  PLT 192  --  210 214  --   --   --  237  LABPROT 13.4  --   --   --   --   --   --  13.9  INR 1.03  --   --   --   --   --   --  1.08  HEPARINUNFRC 0.28*   < > 0.25*  --    < > 0.37 0.87* 1.03*  CREATININE 1.17*  --   --  1.20*  --   --   --  1.22*   < > = values in this interval not displayed.    Assessment: 67yo female supratherapeutic on heparin with higher heparin level despite rate decrease; RN reports no signs of bleeding, CBC stable.  Goal of Therapy:  Heparin level 0.3-0.7 units/ml   Plan:  Will decrease heparin gtt by 4 units/kg/hr to 1250 units/hr and check level in 6 hours.    Vernard GamblesVeronda Iyona Pehrson, PharmD, BCPS  05/21/2017,4:03 AM

## 2017-05-21 NOTE — Progress Notes (Addendum)
      [address]       [place] [id number]             [phone]      Procedure(s) (LRB): CORONARY ARTERY BYPASS GRAFTING (CABG) (N/A) TRANSESOPHAGEAL ECHOCARDIOGRAM (TEE) (N/A)  Subjective:  Patient isn't feeling well this morning.  She states her diarrhea has restarted.  She denies chest pain.  No N/V  Objective: Vital signs in last 24 hours: Temp:  [98 F (36.7 C)-98.3 F (36.8 C)] 98.2 F (36.8 C) (04/18 0449) Pulse Rate:  [79-94] 85 (04/17 2023) Cardiac Rhythm: Sinus tachycardia (04/18 0700) Resp:  [16-18] 18 (04/17 2023) BP: (123-140)/(50-96) 133/56 (04/18 0449) SpO2:  [95 %-98 %] 98 % (04/17 2023) Weight:  [216 lb 9.6 oz (98.2 kg)] 216 lb 9.6 oz (98.2 kg) (04/18 0449)  Intake/Output from previous day: 04/17 0701 - 04/18 0700 In: 605.5 [I.V.:605.5] Out: -  Intake/Output this shift: No intake/output data recorded.  General appearance: alert, cooperative and no distress Heart: regular rate and rhythm Lungs: clear to auscultation bilaterally Abdomen: soft, non-tender; bowel sounds normal; no masses,  no organomegaly Extremities: extremities normal, atraumatic, no cyanosis or edema   Lab Results: Recent Labs    05/19/17 1130 05/21/17 0216  WBC 10.7* 11.7*  HGB 12.6 12.8  HCT 38.4 38.3  PLT 214 237   BMET:  Recent Labs    05/19/17 1130 05/21/17 0216  NA 140 139  K 3.5 2.8*  CL 106 105  CO2 23 22  GLUCOSE 99 92  BUN 23* 20  CREATININE 1.20* 1.22*  CALCIUM 8.6* 8.3*    PT/INR:  Recent Labs    05/21/17 0216  LABPROT 13.9  INR 1.08   ABG No results found for: PHART, HCO3, TCO2, ACIDBASEDEF, O2SAT CBG (last 3)  Recent Labs    05/18/17 0833  GLUCAP 103*    Assessment/Plan: S/P Procedure(s) (LRB): CORONARY ARTERY BYPASS GRAFTING (CABG) (N/A) TRANSESOPHAGEAL ECHOCARDIOGRAM (TEE) (N/A)  1. CV- CAD, needs CABG, continue Coreg, home antihypertensives 2. Pulm- no acute issues, continue IS 3. Renal- baseline CKD, will  repeat BMET, CBC in AM 4. GI- diarrhea has started up again, will check C. Diff 5. Dispo- patient with GI upset, diarrhea this morning, check C. Diff, denies chest pain, repeat labs in AM, possibly for OR if diarrhea improved and C. Diff negative   LOS: 2 days    Audrey Dandyrin Wolf 05/21/2017   Some loose stool this am Cr stable  Plan  cabg tomorrow  The goals risks and alternatives of the planned surgical procedure Procedure(s): CORONARY ARTERY BYPASS GRAFTING (CABG) (N/A) TRANSESOPHAGEAL ECHOCARDIOGRAM (TEE) (N/A)  have been discussed with the patient in detail. The risks of the procedure including death, infection, stroke, myocardial infarction, bleeding, blood transfusion have all been discussed specifically.  I have quoted Audrey Wolf a 3 % of perioperative mortality and a complication rate as high as 40 %. The patient's questions have been answered.Audrey Wolf is willing  to proceed with the planned procedure.  I have seen and examined Audrey Wolf and agree with the above assessment  and plan.  Delight OvensEdward Wolf Audrey Huron MD Beeper [phone] Office [phone] 05/21/2017 8:33 AM

## 2017-05-22 ENCOUNTER — Inpatient Hospital Stay (HOSPITAL_COMMUNITY): Payer: BLUE CROSS/BLUE SHIELD

## 2017-05-22 ENCOUNTER — Inpatient Hospital Stay (HOSPITAL_COMMUNITY)
Admission: AD | Disposition: A | Discharge status: Home / Self Care | Payer: Self-pay | Source: Other Acute Inpatient Hospital | Attending: Cardiothoracic Surgery

## 2017-05-22 ENCOUNTER — Inpatient Hospital Stay (HOSPITAL_COMMUNITY): Payer: BLUE CROSS/BLUE SHIELD | Admitting: Certified Registered Nurse Anesthetist

## 2017-05-22 HISTORY — PX: CORONARY ARTERY BYPASS GRAFT: SHX141

## 2017-05-22 HISTORY — PX: TEE WITHOUT CARDIOVERSION: SHX5443

## 2017-05-22 LAB — POCT I-STAT, CHEM 8
BUN: 16 mg/dL (ref 6–20)
BUN: 14 mg/dL (ref 6–20)
BUN: 16 mg/dL (ref 6–20)
BUN: 18 mg/dL (ref 6–20)
BUN: 18 mg/dL (ref 6–20)
Calcium, Ion: 1.19 mmol/L (ref 1.15–1.40)
Calcium, Ion: 1 mmol/L — ABNORMAL LOW (ref 1.15–1.40)
Calcium, Ion: 1.16 mmol/L (ref 1.15–1.40)
Calcium, Ion: 1.07 mmol/L — ABNORMAL LOW (ref 1.15–1.40)
Calcium, Ion: 1.12 mmol/L — ABNORMAL LOW (ref 1.15–1.40)
Chloride: 102 mmol/L (ref 101–111)
Chloride: 101 mmol/L (ref 101–111)
Chloride: 103 mmol/L (ref 101–111)
Chloride: 104 mmol/L (ref 101–111)
Chloride: 108 mmol/L (ref 101–111)
Creatinine, Ser: 1 mg/dL (ref 0.44–1.00)
Creatinine, Ser: 0.8 mg/dL (ref 0.44–1.00)
Creatinine, Ser: 0.9 mg/dL (ref 0.44–1.00)
Creatinine, Ser: 0.9 mg/dL (ref 0.44–1.00)
Creatinine, Ser: 1 mg/dL (ref 0.44–1.00)
Glucose, Bld: 97 mg/dL (ref 65–99)
Glucose, Bld: 91 mg/dL (ref 65–99)
Glucose, Bld: 99 mg/dL (ref 65–99)
Glucose, Bld: 125 mg/dL — ABNORMAL HIGH (ref 65–99)
Glucose, Bld: 138 mg/dL — ABNORMAL HIGH (ref 65–99)
HCT: 33 % — ABNORMAL LOW (ref 36.0–46.0)
HCT: 26 % — ABNORMAL LOW (ref 36.0–46.0)
HCT: 31 % — ABNORMAL LOW (ref 36.0–46.0)
HCT: 26 % — ABNORMAL LOW (ref 36.0–46.0)
HCT: 29 % — ABNORMAL LOW (ref 36.0–46.0)
Hemoglobin: 11.2 g/dL — ABNORMAL LOW (ref 12.0–15.0)
Hemoglobin: 10.5 g/dL — ABNORMAL LOW (ref 12.0–15.0)
Hemoglobin: 8.8 g/dL — ABNORMAL LOW (ref 12.0–15.0)
Hemoglobin: 8.8 g/dL — ABNORMAL LOW (ref 12.0–15.0)
Hemoglobin: 9.9 g/dL — ABNORMAL LOW (ref 12.0–15.0)
Potassium: 3.8 mmol/L (ref 3.5–5.1)
Potassium: 3.4 mmol/L — ABNORMAL LOW (ref 3.5–5.1)
Potassium: 3.5 mmol/L (ref 3.5–5.1)
Potassium: 3.5 mmol/L (ref 3.5–5.1)
Potassium: 3.9 mmol/L (ref 3.5–5.1)
Sodium: 140 mmol/L (ref 135–145)
Sodium: 141 mmol/L (ref 135–145)
Sodium: 141 mmol/L (ref 135–145)
Sodium: 141 mmol/L (ref 135–145)
Sodium: 140 mmol/L (ref 135–145)
TCO2: 24 mmol/L (ref 22–32)
TCO2: 24 mmol/L (ref 22–32)
TCO2: 26 mmol/L (ref 22–32)
TCO2: 25 mmol/L (ref 22–32)
TCO2: 19 mmol/L — ABNORMAL LOW (ref 22–32)

## 2017-05-22 LAB — CBC
HCT: 37.1 % (ref 36.0–46.0)
HCT: 32.7 % — ABNORMAL LOW (ref 36.0–46.0)
HCT: 31.6 % — ABNORMAL LOW (ref 36.0–46.0)
Hemoglobin: 12.4 g/dL (ref 12.0–15.0)
Hemoglobin: 11 g/dL — ABNORMAL LOW (ref 12.0–15.0)
Hemoglobin: 10.3 g/dL — ABNORMAL LOW (ref 12.0–15.0)
MCH: 30.4 pg (ref 26.0–34.0)
MCH: 30.6 pg (ref 26.0–34.0)
MCH: 29.9 pg (ref 26.0–34.0)
MCHC: 33.4 g/dL (ref 30.0–36.0)
MCHC: 33.6 g/dL (ref 30.0–36.0)
MCHC: 32.6 g/dL (ref 30.0–36.0)
MCV: 90.9 fL (ref 78.0–100.0)
MCV: 90.8 fL (ref 78.0–100.0)
MCV: 91.9 fL (ref 78.0–100.0)
Platelets: 205 10*3/uL (ref 150–400)
Platelets: 189 10*3/uL (ref 150–400)
Platelets: 159 10*3/uL (ref 150–400)
RBC: 4.08 MIL/uL (ref 3.87–5.11)
RBC: 3.6 MIL/uL — ABNORMAL LOW (ref 3.87–5.11)
RBC: 3.44 MIL/uL — ABNORMAL LOW (ref 3.87–5.11)
RDW: 14.6 % (ref 11.5–15.5)
RDW: 14.7 % (ref 11.5–15.5)
RDW: 14.5 % (ref 11.5–15.5)
WBC: 10.2 10*3/uL (ref 4.0–10.5)
WBC: 20.3 10*3/uL — ABNORMAL HIGH (ref 4.0–10.5)
WBC: 14.7 10*3/uL — ABNORMAL HIGH (ref 4.0–10.5)

## 2017-05-22 LAB — POCT I-STAT 3, ART BLOOD GAS (G3+)
Acid-base deficit: 5 mmol/L — ABNORMAL HIGH (ref 0.0–2.0)
Acid-base deficit: 6 mmol/L — ABNORMAL HIGH (ref 0.0–2.0)
Acid-base deficit: 7 mmol/L — ABNORMAL HIGH (ref 0.0–2.0)
Bicarbonate: 24 mmol/L (ref 20.0–28.0)
Bicarbonate: 19.8 mmol/L — ABNORMAL LOW (ref 20.0–28.0)
Bicarbonate: 18.8 mmol/L — ABNORMAL LOW (ref 20.0–28.0)
Bicarbonate: 17.9 mmol/L — ABNORMAL LOW (ref 20.0–28.0)
O2 Saturation: 100 %
O2 Saturation: 96 %
O2 Saturation: 94 %
O2 Saturation: 92 %
Patient temperature: 36.1
Patient temperature: 36.2
Patient temperature: 36.4
TCO2: 25 mmol/L (ref 22–32)
TCO2: 21 mmol/L — ABNORMAL LOW (ref 22–32)
TCO2: 20 mmol/L — ABNORMAL LOW (ref 22–32)
TCO2: 19 mmol/L — ABNORMAL LOW (ref 22–32)
pCO2 arterial: 37.1 mmHg (ref 32.0–48.0)
pCO2 arterial: 35.1 mmHg (ref 32.0–48.0)
pCO2 arterial: 32.8 mmHg (ref 32.0–48.0)
pCO2 arterial: 33.8 mmHg (ref 32.0–48.0)
pH, Arterial: 7.418 (ref 7.350–7.450)
pH, Arterial: 7.356 (ref 7.350–7.450)
pH, Arterial: 7.363 (ref 7.350–7.450)
pH, Arterial: 7.328 — ABNORMAL LOW (ref 7.350–7.450)
pO2, Arterial: 372 mmHg — ABNORMAL HIGH (ref 83.0–108.0)
pO2, Arterial: 78 mmHg — ABNORMAL LOW (ref 83.0–108.0)
pO2, Arterial: 69 mmHg — ABNORMAL LOW (ref 83.0–108.0)
pO2, Arterial: 67 mmHg — ABNORMAL LOW (ref 83.0–108.0)

## 2017-05-22 LAB — POCT I-STAT 4, (NA,K, GLUC, HGB,HCT)
Glucose, Bld: 119 mg/dL — ABNORMAL HIGH (ref 65–99)
HCT: 31 % — ABNORMAL LOW (ref 36.0–46.0)
Hemoglobin: 10.5 g/dL — ABNORMAL LOW (ref 12.0–15.0)
Potassium: 3.5 mmol/L (ref 3.5–5.1)
Sodium: 142 mmol/L (ref 135–145)

## 2017-05-22 LAB — BASIC METABOLIC PANEL
Anion gap: 11 (ref 5–15)
BUN: 15 mg/dL (ref 6–20)
CO2: 22 mmol/L (ref 22–32)
CREATININE: 1.04 mg/dL — AB (ref 0.44–1.00)
Calcium: 8.3 mg/dL — ABNORMAL LOW (ref 8.9–10.3)
Chloride: 104 mmol/L (ref 101–111)
GFR calc Af Amer: >60 mL/min (ref >60)
GFR, EST NON AFRICAN AMERICAN: 54 mL/min — AB (ref >60)
Glucose, Bld: 95 mg/dL (ref 65–99)
POTASSIUM: 3.5 mmol/L (ref 3.5–5.1)
SODIUM: 137 mmol/L (ref 135–145)

## 2017-05-22 LAB — GLUCOSE, CAPILLARY
Glucose-Capillary: 121 mg/dL — ABNORMAL HIGH (ref 65–99)
Glucose-Capillary: 140 mg/dL — ABNORMAL HIGH (ref 65–99)
Glucose-Capillary: 138 mg/dL — ABNORMAL HIGH (ref 65–99)

## 2017-05-22 LAB — CREATININE, SERUM
Creatinine, Ser: 1.18 mg/dL — ABNORMAL HIGH (ref 0.44–1.00)
GFR calc Af Amer: 54 mL/min — ABNORMAL LOW (ref >60)
GFR calc non Af Amer: 47 mL/min — ABNORMAL LOW (ref >60)

## 2017-05-22 LAB — MAGNESIUM: Magnesium: 3 mg/dL — ABNORMAL HIGH (ref 1.7–2.4)

## 2017-05-22 LAB — HEMOGLOBIN AND HEMATOCRIT, BLOOD
HCT: 26.7 % — ABNORMAL LOW (ref 36.0–46.0)
Hemoglobin: 9 g/dL — ABNORMAL LOW (ref 12.0–15.0)

## 2017-05-22 LAB — PROTIME-INR
INR: 1.31
PROTHROMBIN TIME: 16.2 s — AB (ref 11.4–15.2)

## 2017-05-22 LAB — PLATELET COUNT: Platelets: 183 K/uL (ref 150–400)

## 2017-05-22 LAB — APTT: aPTT: 39 seconds — ABNORMAL HIGH (ref 24–36)

## 2017-05-22 SURGERY — CORONARY ARTERY BYPASS GRAFTING (CABG)
Anesthesia: General | Site: Chest

## 2017-05-22 MED ORDER — PHENYLEPHRINE HCL 10 MG/ML IJ SOLN
INTRAVENOUS | Status: DC | PRN
  Administered 2017-05-22: 25 ug/min via INTRAVENOUS

## 2017-05-22 MED ORDER — CHLORHEXIDINE GLUCONATE 0.12 % MT SOLN
15.0000 mL | OROMUCOSAL | Status: AC
  Administered 2017-05-22: 15 mL via OROMUCOSAL

## 2017-05-22 MED ORDER — METOPROLOL TARTRATE 12.5 MG HALF TABLET: 12.5000 mg | ORAL_TABLET | Freq: Two times a day (BID) | ORAL | Status: DC

## 2017-05-22 MED ORDER — ASPIRIN EC 325 MG PO TBEC
325.0000 mg | DELAYED_RELEASE_TABLET | Freq: Every day | ORAL | Status: DC
Start: 2017-05-23 — End: 2017-05-27
  Administered 2017-05-23 – 2017-05-27 (×5): 325 mg via ORAL
  Filled 2017-05-22 (×5): qty 1

## 2017-05-22 MED ORDER — TRAMADOL HCL 50 MG PO TABS
50.0000 mg | ORAL_TABLET | ORAL | Status: DC | PRN
  Administered 2017-05-23: 100 mg via ORAL
  Filled 2017-05-22: qty 2

## 2017-05-22 MED ORDER — PHENYLEPHRINE HCL 10 MG/ML IJ SOLN
0.0000 ug/min | INTRAMUSCULAR | Status: DC
  Filled 2017-05-22: qty 2

## 2017-05-22 MED ORDER — DOCUSATE SODIUM 100 MG PO CAPS
200.0000 mg | ORAL_CAPSULE | Freq: Every day | ORAL | Status: DC
  Administered 2017-05-23 – 2017-05-25 (×3): 200 mg via ORAL
  Filled 2017-05-22 (×5): qty 2

## 2017-05-22 MED ORDER — MILRINONE LACTATE IN DEXTROSE 20-5 MG/100ML-% IV SOLN
0.0000 ug/kg/min | INTRAVENOUS | Status: DC
  Administered 2017-05-22 – 2017-05-23 (×2): 0.3 ug/kg/min via INTRAVENOUS
  Administered 2017-05-24: 0.125 ug/kg/min via INTRAVENOUS
  Filled 2017-05-22 (×3): qty 100

## 2017-05-22 MED ORDER — METOPROLOL TARTRATE 25 MG/10 ML ORAL SUSPENSION: 12.5000 mg | Freq: Two times a day (BID) | ORAL | Status: DC

## 2017-05-22 MED ORDER — ROCURONIUM BROMIDE 10 MG/ML (PF) SYRINGE
PREFILLED_SYRINGE | INTRAVENOUS | Status: DC | PRN
  Administered 2017-05-22 (×4): 50 mg via INTRAVENOUS

## 2017-05-22 MED ORDER — FENTANYL CITRATE (PF) 100 MCG/2ML IJ SOLN
INTRAMUSCULAR | Status: DC | PRN
  Administered 2017-05-22 (×2): 100 ug via INTRAVENOUS
  Administered 2017-05-22: 50 ug via INTRAVENOUS
  Administered 2017-05-22: 250 ug via INTRAVENOUS
  Administered 2017-05-22: 100 ug via INTRAVENOUS
  Administered 2017-05-22: 50 ug via INTRAVENOUS
  Administered 2017-05-22: 250 ug via INTRAVENOUS
  Administered 2017-05-22: 450 ug via INTRAVENOUS
  Administered 2017-05-22: 100 ug via INTRAVENOUS

## 2017-05-22 MED ORDER — 0.9 % SODIUM CHLORIDE (POUR BTL) OPTIME
TOPICAL | Status: DC | PRN
  Administered 2017-05-22: 5000 mL

## 2017-05-22 MED ORDER — LACTATED RINGERS IV SOLN
INTRAVENOUS | Status: DC | PRN
  Administered 2017-05-22: 07:00:00 via INTRAVENOUS

## 2017-05-22 MED ORDER — PROTAMINE SULFATE 10 MG/ML IV SOLN
INTRAVENOUS | Status: AC
  Filled 2017-05-22: qty 25

## 2017-05-22 MED ORDER — METOPROLOL TARTRATE 5 MG/5ML IV SOLN
2.5000 mg | INTRAVENOUS | Status: DC | PRN
  Administered 2017-05-26: 5 mg via INTRAVENOUS
  Filled 2017-05-22: qty 5

## 2017-05-22 MED ORDER — ALBUMIN HUMAN 5 % IV SOLN
INTRAVENOUS | Status: DC | PRN
  Administered 2017-05-22 (×2): via INTRAVENOUS

## 2017-05-22 MED ORDER — LIDOCAINE 2% (20 MG/ML) 5 ML SYRINGE
INTRAMUSCULAR | Status: DC | PRN
  Administered 2017-05-22: 100 mg via INTRAVENOUS

## 2017-05-22 MED ORDER — MIDAZOLAM HCL 5 MG/5ML IJ SOLN
INTRAMUSCULAR | Status: DC | PRN
  Administered 2017-05-22 (×2): 2 mg via INTRAVENOUS
  Administered 2017-05-22 (×2): 1 mg via INTRAVENOUS
  Administered 2017-05-22: 2 mg via INTRAVENOUS

## 2017-05-22 MED ORDER — ACETAMINOPHEN 650 MG RE SUPP
650.0000 mg | Freq: Once | RECTAL | Status: AC
  Administered 2017-05-22: 650 mg via RECTAL

## 2017-05-22 MED ORDER — SODIUM CHLORIDE 0.9% FLUSH
3.0000 mL | Freq: Two times a day (BID) | INTRAVENOUS | Status: DC
  Administered 2017-05-23 – 2017-05-27 (×7): 3 mL via INTRAVENOUS

## 2017-05-22 MED ORDER — VANCOMYCIN HCL IN DEXTROSE 1-5 GM/200ML-% IV SOLN
1000.0000 mg | Freq: Once | INTRAVENOUS | Status: AC
  Administered 2017-05-22: 1000 mg via INTRAVENOUS
  Filled 2017-05-22: qty 200

## 2017-05-22 MED ORDER — INSULIN REGULAR BOLUS VIA INFUSION
0.0000 [IU] | Freq: Three times a day (TID) | INTRAVENOUS | Status: DC
  Filled 2017-05-22: qty 10

## 2017-05-22 MED ORDER — DEXMEDETOMIDINE HCL IN NACL 200 MCG/50ML IV SOLN
INTRAVENOUS | Status: DC | PRN
  Administered 2017-05-22: .3 ug/kg/h via INTRAVENOUS

## 2017-05-22 MED ORDER — LEVALBUTEROL HCL 0.63 MG/3ML IN NEBU
0.6300 mg | INHALATION_SOLUTION | Freq: Three times a day (TID) | RESPIRATORY_TRACT | Status: DC
  Administered 2017-05-22 – 2017-05-23 (×5): 0.63 mg via RESPIRATORY_TRACT
  Filled 2017-05-22 (×6): qty 3

## 2017-05-22 MED ORDER — MIDAZOLAM HCL 10 MG/2ML IJ SOLN
INTRAMUSCULAR | Status: AC
  Filled 2017-05-22: qty 2

## 2017-05-22 MED ORDER — NITROGLYCERIN 0.2 MG/ML ON CALL CATH LAB: INTRAVENOUS | Status: DC | PRN

## 2017-05-22 MED ORDER — SODIUM CHLORIDE 0.9 % IV SOLN
INTRAVENOUS | Status: DC | PRN
  Administered 2017-05-22: .7 [IU]/h via INTRAVENOUS

## 2017-05-22 MED ORDER — LACTATED RINGERS IV SOLN
INTRAVENOUS | Status: DC | PRN
  Administered 2017-05-22 (×2): via INTRAVENOUS

## 2017-05-22 MED ORDER — PLASMA-LYTE 148 IV SOLN
INTRAVENOUS | Status: DC | PRN
  Administered 2017-05-22: 500 mL via INTRAVASCULAR

## 2017-05-22 MED ORDER — SODIUM CHLORIDE 0.9 % IV SOLN
INTRAVENOUS | Status: DC
  Administered 2017-05-23: 05:00:00 via INTRAVENOUS

## 2017-05-22 MED ORDER — ONDANSETRON HCL 4 MG/2ML IJ SOLN
4.0000 mg | Freq: Four times a day (QID) | INTRAMUSCULAR | Status: DC | PRN
  Administered 2017-05-23: 4 mg via INTRAVENOUS
  Filled 2017-05-22: qty 2

## 2017-05-22 MED ORDER — MORPHINE SULFATE (PF) 2 MG/ML IV SOLN: 1.0000 mg | INTRAVENOUS | Status: DC | PRN

## 2017-05-22 MED ORDER — ROCURONIUM BROMIDE 10 MG/ML (PF) SYRINGE
PREFILLED_SYRINGE | INTRAVENOUS | Status: AC
  Filled 2017-05-22: qty 10

## 2017-05-22 MED ORDER — DEXMEDETOMIDINE HCL IN NACL 200 MCG/50ML IV SOLN: 0.0000 ug/kg/h | INTRAVENOUS | Status: DC

## 2017-05-22 MED ORDER — PROTAMINE SULFATE 10 MG/ML IV SOLN
INTRAVENOUS | Status: DC | PRN
  Administered 2017-05-22: 20 mg via INTRAVENOUS
  Administered 2017-05-22: 40 mg via INTRAVENOUS
  Administered 2017-05-22 (×2): 20 mg via INTRAVENOUS
  Administered 2017-05-22: 40 mg via INTRAVENOUS
  Administered 2017-05-22 (×2): 30 mg via INTRAVENOUS
  Administered 2017-05-22: 20 mg via INTRAVENOUS
  Administered 2017-05-22 (×2): 30 mg via INTRAVENOUS
  Administered 2017-05-22: 20 mg via INTRAVENOUS

## 2017-05-22 MED ORDER — BISACODYL 5 MG PO TBEC
10.0000 mg | DELAYED_RELEASE_TABLET | Freq: Every day | ORAL | Status: DC
  Administered 2017-05-23 – 2017-05-25 (×3): 10 mg via ORAL
  Filled 2017-05-22 (×5): qty 2

## 2017-05-22 MED ORDER — SODIUM CHLORIDE 0.9 % IV SOLN
INTRAVENOUS | Status: DC
  Filled 2017-05-22: qty 1

## 2017-05-22 MED ORDER — PANTOPRAZOLE SODIUM 40 MG PO TBEC
40.0000 mg | DELAYED_RELEASE_TABLET | Freq: Every day | ORAL | Status: DC
  Administered 2017-05-24 – 2017-05-27 (×4): 40 mg via ORAL
  Filled 2017-05-22 (×4): qty 1

## 2017-05-22 MED ORDER — MORPHINE SULFATE (PF) 2 MG/ML IV SOLN
2.0000 mg | INTRAVENOUS | Status: DC | PRN
  Administered 2017-05-22: 2 mg via INTRAVENOUS
  Administered 2017-05-23 (×2): 4 mg via INTRAVENOUS
  Filled 2017-05-22: qty 2
  Filled 2017-05-22: qty 1
  Filled 2017-05-22: qty 2

## 2017-05-22 MED ORDER — HEPARIN SODIUM (PORCINE) 1000 UNIT/ML IJ SOLN
INTRAMUSCULAR | Status: DC | PRN
  Administered 2017-05-22: 28000 [IU] via INTRAVENOUS
  Administered 2017-05-22: 10000 [IU] via INTRAVENOUS

## 2017-05-22 MED ORDER — LACTATED RINGERS IV SOLN: INTRAVENOUS | Status: DC

## 2017-05-22 MED ORDER — CHLORHEXIDINE GLUCONATE 0.12% ORAL RINSE (MEDLINE KIT): 15.0000 mL | Freq: Two times a day (BID) | OROMUCOSAL | Status: DC

## 2017-05-22 MED ORDER — METOCLOPRAMIDE HCL 5 MG/ML IJ SOLN
10.0000 mg | Freq: Four times a day (QID) | INTRAMUSCULAR | Status: AC
  Administered 2017-05-22 – 2017-05-23 (×4): 10 mg via INTRAVENOUS
  Filled 2017-05-22 (×3): qty 2

## 2017-05-22 MED ORDER — FENTANYL CITRATE (PF) 250 MCG/5ML IJ SOLN
INTRAMUSCULAR | Status: AC
  Filled 2017-05-22: qty 5

## 2017-05-22 MED ORDER — SODIUM CHLORIDE 0.9 % IV SOLN
1.5000 g | Freq: Two times a day (BID) | INTRAVENOUS | Status: AC
  Administered 2017-05-22 – 2017-05-24 (×4): 1.5 g via INTRAVENOUS
  Filled 2017-05-22 (×4): qty 1.5

## 2017-05-22 MED ORDER — SODIUM CHLORIDE 0.9 % IJ SOLN
OROMUCOSAL | Status: DC | PRN
  Administered 2017-05-22 (×3): 4 mL via TOPICAL

## 2017-05-22 MED ORDER — FENTANYL CITRATE (PF) 250 MCG/5ML IJ SOLN
INTRAMUSCULAR | Status: AC
  Filled 2017-05-22: qty 25

## 2017-05-22 MED ORDER — NITROGLYCERIN 0.2 MG/ML ON CALL CATH LAB
INTRAVENOUS | Status: DC | PRN
  Administered 2017-05-22: 40 ug via INTRAVENOUS

## 2017-05-22 MED ORDER — SODIUM CHLORIDE 0.9 % IV SOLN: 250.0000 mL | INTRAVENOUS | Status: DC

## 2017-05-22 MED ORDER — DOPAMINE-DEXTROSE 3.2-5 MG/ML-% IV SOLN
INTRAVENOUS | Status: DC | PRN
  Administered 2017-05-22: 3 ug/kg/min via INTRAVENOUS

## 2017-05-22 MED ORDER — ORAL CARE MOUTH RINSE
15.0000 mL | OROMUCOSAL | Status: DC
  Administered 2017-05-22 (×2): 15 mL via OROMUCOSAL

## 2017-05-22 MED ORDER — ASPIRIN 81 MG PO CHEW: 324.0000 mg | CHEWABLE_TABLET | Freq: Every day | ORAL | Status: DC

## 2017-05-22 MED ORDER — BISACODYL 10 MG RE SUPP: 10.0000 mg | Freq: Every day | RECTAL | Status: DC

## 2017-05-22 MED ORDER — ACETAMINOPHEN 160 MG/5ML PO SOLN: 650.0000 mg | Freq: Once | ORAL | Status: AC

## 2017-05-22 MED ORDER — LIDOCAINE 2% (20 MG/ML) 5 ML SYRINGE
INTRAMUSCULAR | Status: AC
  Filled 2017-05-22: qty 5

## 2017-05-22 MED ORDER — POTASSIUM CHLORIDE 10 MEQ/50ML IV SOLN
10.0000 meq | INTRAVENOUS | Status: AC
  Administered 2017-05-22 (×3): 10 meq via INTRAVENOUS

## 2017-05-22 MED ORDER — SODIUM CHLORIDE 0.45 % IV SOLN: INTRAVENOUS | Status: DC | PRN

## 2017-05-22 MED ORDER — SUCCINYLCHOLINE CHLORIDE 200 MG/10ML IV SOSY
PREFILLED_SYRINGE | INTRAVENOUS | Status: AC
  Filled 2017-05-22: qty 10

## 2017-05-22 MED ORDER — HEMOSTATIC AGENTS (NO CHARGE) OPTIME
TOPICAL | Status: DC | PRN
  Administered 2017-05-22 (×2): 1 via TOPICAL

## 2017-05-22 MED ORDER — INSULIN ASPART 100 UNIT/ML ~~LOC~~ SOLN
0.0000 [IU] | SUBCUTANEOUS | Status: DC
  Administered 2017-05-22 – 2017-05-23 (×3): 2 [IU] via SUBCUTANEOUS

## 2017-05-22 MED ORDER — SODIUM CHLORIDE 0.9% FLUSH: 3.0000 mL | INTRAVENOUS | Status: DC | PRN

## 2017-05-22 MED ORDER — MIDAZOLAM HCL 2 MG/2ML IJ SOLN: 2.0000 mg | INTRAMUSCULAR | Status: DC | PRN

## 2017-05-22 MED ORDER — DOPAMINE-DEXTROSE 3.2-5 MG/ML-% IV SOLN: 0.0000 ug/kg/min | INTRAVENOUS | Status: DC

## 2017-05-22 MED ORDER — PROPOFOL 10 MG/ML IV BOLUS
INTRAVENOUS | Status: DC | PRN
  Administered 2017-05-22: 50 mg via INTRAVENOUS

## 2017-05-22 MED ORDER — METOPROLOL TARTRATE 12.5 MG HALF TABLET
12.5000 mg | ORAL_TABLET | Freq: Two times a day (BID) | ORAL | Status: DC
  Administered 2017-05-22 – 2017-05-23 (×3): 12.5 mg via ORAL
  Filled 2017-05-22 (×3): qty 1

## 2017-05-22 MED ORDER — FAMOTIDINE IN NACL 20-0.9 MG/50ML-% IV SOLN
20.0000 mg | Freq: Two times a day (BID) | INTRAVENOUS | Status: AC
  Administered 2017-05-22 (×2): 20 mg via INTRAVENOUS
  Filled 2017-05-22: qty 50

## 2017-05-22 MED ORDER — MAGNESIUM SULFATE 4 GM/100ML IV SOLN
4.0000 g | Freq: Once | INTRAVENOUS | Status: AC
  Administered 2017-05-22: 4 g via INTRAVENOUS
  Filled 2017-05-22: qty 100

## 2017-05-22 MED ORDER — HEPARIN SODIUM (PORCINE) 1000 UNIT/ML IJ SOLN
INTRAMUSCULAR | Status: AC
  Filled 2017-05-22: qty 1

## 2017-05-22 MED ORDER — SUCCINYLCHOLINE CHLORIDE 20 MG/ML IJ SOLN
INTRAMUSCULAR | Status: DC | PRN
  Administered 2017-05-22: 120 mg via INTRAVENOUS

## 2017-05-22 MED ORDER — ALBUMIN HUMAN 5 % IV SOLN
250.0000 mL | INTRAVENOUS | Status: AC | PRN
  Administered 2017-05-22 (×3): 250 mL via INTRAVENOUS
  Filled 2017-05-22: qty 250

## 2017-05-22 MED ORDER — ACETAMINOPHEN 500 MG PO TABS
1000.0000 mg | ORAL_TABLET | Freq: Four times a day (QID) | ORAL | Status: DC
  Administered 2017-05-23 – 2017-05-27 (×17): 1000 mg via ORAL
  Filled 2017-05-22 (×17): qty 2

## 2017-05-22 MED ORDER — LACTATED RINGERS IV SOLN: 500.0000 mL | Freq: Once | INTRAVENOUS | Status: DC | PRN

## 2017-05-22 MED ORDER — ACETAMINOPHEN 160 MG/5ML PO SOLN
1000.0000 mg | Freq: Four times a day (QID) | ORAL | Status: DC
  Administered 2017-05-23 (×2): 1000 mg
  Filled 2017-05-22 (×2): qty 40.6

## 2017-05-22 MED ORDER — NITROGLYCERIN IN D5W 200-5 MCG/ML-% IV SOLN
0.0000 ug/min | INTRAVENOUS | Status: DC
  Administered 2017-05-23: 80 ug/min via INTRAVENOUS
  Administered 2017-05-24: 20 ug/min via INTRAVENOUS
  Filled 2017-05-22 (×2): qty 250

## 2017-05-22 MED ORDER — ORAL CARE MOUTH RINSE
15.0000 mL | Freq: Two times a day (BID) | OROMUCOSAL | Status: DC
  Administered 2017-05-22 – 2017-05-26 (×4): 15 mL via OROMUCOSAL

## 2017-05-22 MED ORDER — MILRINONE LACTATE IN DEXTROSE 20-5 MG/100ML-% IV SOLN
INTRAVENOUS | Status: DC | PRN
  Administered 2017-05-22: 0.25 ug/kg/min via INTRAVENOUS

## 2017-05-22 MED ORDER — OXYCODONE HCL 5 MG PO TABS: 5.0000 mg | ORAL_TABLET | ORAL | Status: DC | PRN

## 2017-05-22 SURGICAL SUPPLY — 66 items
BAG DECANTER FOR FLEXI CONT (MISCELLANEOUS) ×3 IMPLANT
BANDAGE ACE 4X5 VEL STRL LF (GAUZE/BANDAGES/DRESSINGS) ×3 IMPLANT
BANDAGE ACE 6X5 VEL STRL LF (GAUZE/BANDAGES/DRESSINGS) ×3 IMPLANT
BLADE STERNUM SYSTEM 6 (BLADE) ×3 IMPLANT
BLADE SURG 11 STRL SS (BLADE) ×3 IMPLANT
BNDG GAUZE ELAST 4 BULKY (GAUZE/BANDAGES/DRESSINGS) ×3 IMPLANT
CANISTER SUCT 3000ML PPV (MISCELLANEOUS) ×3 IMPLANT
CATH CPB KIT GERHARDT (MISCELLANEOUS) ×3 IMPLANT
CATH THORACIC 28FR (CATHETERS) ×3 IMPLANT
CRADLE DONUT ADULT HEAD (MISCELLANEOUS) ×3 IMPLANT
DRAIN CHANNEL 28F RND 3/8 FF (WOUND CARE) ×3 IMPLANT
DRAPE CARDIOVASCULAR INCISE (DRAPES) ×1
DRAPE SLUSH/WARMER DISC (DRAPES) ×3 IMPLANT
DRAPE SRG 135X102X78XABS (DRAPES) ×2 IMPLANT
DRSG AQUACEL AG ADV 3.5X14 (GAUZE/BANDAGES/DRESSINGS) ×3 IMPLANT
ELECT BLADE 4.0 EZ CLEAN MEGAD (MISCELLANEOUS) ×3
ELECT REM PT RETURN 9FT ADLT (ELECTROSURGICAL) ×6
ELECTRODE BLDE 4.0 EZ CLN MEGD (MISCELLANEOUS) ×2 IMPLANT
ELECTRODE REM PT RTRN 9FT ADLT (ELECTROSURGICAL) ×4 IMPLANT
FELT TEFLON 1X6 (MISCELLANEOUS) ×3 IMPLANT
FLOSEAL 10ML (HEMOSTASIS) ×3 IMPLANT
GAUZE SPONGE 4X4 12PLY STRL (GAUZE/BANDAGES/DRESSINGS) ×6 IMPLANT
GLOVE BIO SURGEON STRL SZ 6.5 (GLOVE) ×18 IMPLANT
GOWN STRL REUS W/ TWL LRG LVL3 (GOWN DISPOSABLE) ×14 IMPLANT
GOWN STRL REUS W/TWL LRG LVL3 (GOWN DISPOSABLE) ×7
HEMOSTAT POWDER SURGIFOAM 1G (HEMOSTASIS) ×9 IMPLANT
HEMOSTAT SURGICEL 2X14 (HEMOSTASIS) ×3 IMPLANT
KIT BASIN OR (CUSTOM PROCEDURE TRAY) ×3 IMPLANT
KIT CATH SUCT 8FR (CATHETERS) ×3 IMPLANT
KIT SUCTION CATH 14FR (SUCTIONS) ×6 IMPLANT
KIT TURNOVER KIT B (KITS) ×3 IMPLANT
KIT VASOVIEW HEMOPRO VH 3000 (KITS) ×3 IMPLANT
LEAD PACING MYOCARDI (MISCELLANEOUS) ×3 IMPLANT
MARKER GRAFT CORONARY BYPASS (MISCELLANEOUS) ×3 IMPLANT
NS IRRIG 1000ML POUR BTL (IV SOLUTION) ×15 IMPLANT
PACK E OPEN HEART (SUTURE) ×3 IMPLANT
PACK OPEN HEART (CUSTOM PROCEDURE TRAY) ×3 IMPLANT
PAD ARMBOARD 7.5X6 YLW CONV (MISCELLANEOUS) ×6 IMPLANT
PAD ELECT DEFIB RADIOL ZOLL (MISCELLANEOUS) ×3 IMPLANT
PENCIL BUTTON HOLSTER BLD 10FT (ELECTRODE) ×3 IMPLANT
PUNCH AORTIC ROTATE  4.5MM 8IN (MISCELLANEOUS) ×3 IMPLANT
SET CARDIOPLEGIA MPS 5001102 (MISCELLANEOUS) ×3 IMPLANT
SPONGE LAP 18X18 X RAY DECT (DISPOSABLE) ×6 IMPLANT
SUT BONE WAX W31G (SUTURE) ×3 IMPLANT
SUT MNCRL AB 4-0 PS2 18 (SUTURE) ×3 IMPLANT
SUT PROLENE 3 0 SH1 36 (SUTURE) ×3 IMPLANT
SUT PROLENE 4 0 TF (SUTURE) ×6 IMPLANT
SUT PROLENE 6 0 CC (SUTURE) ×6 IMPLANT
SUT PROLENE 7 0 BV 1 (SUTURE) ×3 IMPLANT
SUT PROLENE 7 0 BV1 MDA (SUTURE) ×3 IMPLANT
SUT PROLENE 8 0 BV175 6 (SUTURE) ×3 IMPLANT
SUT STEEL 6MS V (SUTURE) ×3 IMPLANT
SUT STEEL SZ 6 DBL 3X14 BALL (SUTURE) ×3 IMPLANT
SUT VIC AB 1 CTX 18 (SUTURE) ×6 IMPLANT
SUT VIC AB 2-0 CT1 27 (SUTURE) ×1
SUT VIC AB 2-0 CT1 TAPERPNT 27 (SUTURE) ×2 IMPLANT
SYSTEM SAHARA CHEST DRAIN ATS (WOUND CARE) ×3 IMPLANT
TAPE CLOTH SURG 4X10 WHT LF (GAUZE/BANDAGES/DRESSINGS) ×6 IMPLANT
TAPE PAPER MEDFIX 1IN X 10YD (GAUZE/BANDAGES/DRESSINGS) ×3 IMPLANT
TOWEL GREEN STERILE (TOWEL DISPOSABLE) ×3 IMPLANT
TOWEL GREEN STERILE FF (TOWEL DISPOSABLE) ×3 IMPLANT
TRAY FOLEY SILVER 16FR TEMP (SET/KITS/TRAYS/PACK) ×3 IMPLANT
TUBING INSUFF HIGH FLOW RTP (TUBING) ×3 IMPLANT
TUBING INSUFFLATION (TUBING) ×3 IMPLANT
UNDERPAD 30X30 (UNDERPADS AND DIAPERS) ×3 IMPLANT
WATER STERILE IRR 1000ML POUR (IV SOLUTION) ×6 IMPLANT

## 2017-05-22 NOTE — Anesthesia Procedure Notes (Signed)
Central Venous Catheter Insertion Performed by: Shelton SilvasHollis, Carthel Castille D, MD, anesthesiologist Start/End4/19/2019 6:35 AM, 05/22/2017 6:45 AM Patient location: Pre-op. Preanesthetic checklist: patient identified, IV checked, site marked, risks and benefits discussed, surgical consent, monitors and equipment checked, pre-op evaluation, timeout performed and anesthesia consent Position: Trendelenburg Lidocaine 1% used for infiltration and patient sedated Hand hygiene performed , maximum sterile barriers used  and Seldinger technique used Catheter size: 8.5 Fr Total catheter length 10. Central line was placed.Sheath introducer Swan type:thermodilution PA Cath depth:50 Procedure performed using ultrasound guided technique. Ultrasound Notes:anatomy identified, needle tip was noted to be adjacent to the nerve/plexus identified, no ultrasound evidence of intravascular and/or intraneural injection and image(s) printed for medical record Attempts: 1 Following insertion, line sutured and dressing applied. Post procedure assessment: blood return through all ports, free fluid flow and no air  Patient tolerated the procedure well with no immediate complications.

## 2017-05-22 NOTE — OR Nursing (Signed)
Second call to SICU and call to Cath Lab.

## 2017-05-22 NOTE — Progress Notes (Signed)
Initiated Open Heart Rapid Wean per Protocol 

## 2017-05-22 NOTE — Anesthesia Procedure Notes (Signed)
Central Venous Catheter Insertion Performed by: Shelton SilvasHollis, Kadan Millstein D, MD, anesthesiologist Start/End4/19/2019 6:45 AM, 05/22/2017 6:50 AM Patient location: Pre-op. Preanesthetic checklist: patient identified, IV checked, site marked, risks and benefits discussed, surgical consent, monitors and equipment checked, pre-op evaluation, timeout performed and anesthesia consent Hand hygiene performed  and maximum sterile barriers used  PA cath was placed.Swan type:thermodilution Procedure performed without using ultrasound guided technique. Attempts: 1 Patient tolerated the procedure well with no immediate complications.

## 2017-05-22 NOTE — Progress Notes (Signed)
  Echocardiogram Echocardiogram Transesophageal has been performed.  Audrey Wolf 05/22/2017, 9:57 AM

## 2017-05-22 NOTE — Progress Notes (Signed)
Patient ID: Audrey BimlerKatherine R Wolf, female   DOB: [birth year], 67 y.o.   MRN: [id number]  Pre Procedure note for inpatients:       [address]       [place] [id number]             [phone]      Audrey Wolf has been scheduled for Procedure(s): CORONARY ARTERY BYPASS GRAFTING (CABG) (N/A) TRANSESOPHAGEAL ECHOCARDIOGRAM (TEE) (N/A) today. The various methods of treatment have been discussed with the patient. After consideration of the risks, benefits and treatment options the patient has consented to the planned procedure.   The patient has been seen and labs reviewed. There are no changes in the patient's condition to prevent proceeding with the planned procedure today.  Recent labs:  Lab Results  Component Value Date   WBC 11.7 (H) 05/21/2017   HGB 12.8 05/21/2017   HCT 38.3 05/21/2017   PLT 237 05/21/2017   GLUCOSE 92 05/21/2017   CHOL 99 05/17/2017   TRIG 72 05/17/2017   HDL 39 (L) 05/17/2017   LDLCALC 46 05/17/2017   ALT 31 05/21/2017   AST 34 05/21/2017   NA 139 05/21/2017   K 2.8 (L) 05/21/2017   CL 105 05/21/2017   CREATININE 1.22 (H) 05/21/2017   BUN 20 05/21/2017   CO2 22 05/21/2017   TSH 3.58 06/05/2016   INR 1.08 05/21/2017   HGBA1C 5.5 05/19/2017   MICROALBUR 8.7 (H) 03/22/2015   Labs ordered this am 5 not done, repeat pending   Audrey OvensEdward B Caylin Raby, MD 05/22/2017 7:05 AM

## 2017-05-22 NOTE — Progress Notes (Signed)
Pt weaning parameters obtained. NIF -25 VC=75015mls. Pt responds to commands, lifts head and wiggles toes. ETT leak heard, ABG results within normal parameters. Pt extubated and placed on 4 lpm [place] with SpO2 at 91%, increased to 6 lpm [place] with SpO2 at 96%. Pt tol well. Pt verbalized name and pt is oriented to time and place.  No stridor, breath sounds equal and bilateral.

## 2017-05-22 NOTE — OR Nursing (Signed)
First call to SICU at 1143.

## 2017-05-22 NOTE — Anesthesia Postprocedure Evaluation (Signed)
Anesthesia Post Note  Patient: Audrey Wolf  Procedure(s) Performed: CORONARY ARTERY BYPASS GRAFTING (CABG) x 2 WITH ENDOSCOPIC HARVESTING OF RIGHT SAPHENOUS VEIN (N/A Chest) TRANSESOPHAGEAL ECHOCARDIOGRAM (TEE) (N/A )     Patient location during evaluation: SICU Anesthesia Type: General Level of consciousness: sedated Pain management: pain level controlled Vital Signs Assessment: post-procedure vital signs reviewed and stable Respiratory status: patient remains intubated per anesthesia plan Cardiovascular status: stable Postop Assessment: no apparent nausea or vomiting Anesthetic complications: no    Last Vitals:  Vitals:   05/22/17 1300 05/22/17 1318  BP: (!) 90/46   Pulse: 62   Resp: 12   Temp: (!) 36.3 C   SpO2: 93% 97%    Last Pain:  Vitals:   05/22/17 0357  TempSrc: Oral  PainSc:                  Kennieth RadFitzgerald, Tiarna Koppen E

## 2017-05-22 NOTE — Progress Notes (Signed)
Patient accompanied with transporter to preop for CABG per Dr. Tyrone SageGerhardt. Report given to OR Rn Patient stable

## 2017-05-22 NOTE — Brief Op Note (Signed)
      [address]       [place] [id number]             [phone]      05/22/2017  3:08 PM  PATIENT:  Alvy BimlerKatherine R Leask  67 y.o. female  PRE-OPERATIVE DIAGNOSIS:  CAD/ left main   POST-OPERATIVE DIAGNOSIS:  CAD  PROCEDURE:  Procedure(s): CORONARY ARTERY BYPASS GRAFTING (CABG) x 2 WITH ENDOSCOPIC HARVESTING OF RIGHT SAPHENOUS VEIN (N/A) TRANSESOPHAGEAL ECHOCARDIOGRAM (TEE) (N/A)  SURGEON:  Surgeon(s) and Role:    * Delight OvensGerhardt, Jovanie Verge B, MD - Primary  PHYSICIAN ASSISTANT: E Barrett PA    ANESTHESIA:   general  EBL:  900 mL   BLOOD ADMINISTERED:none  DRAINS: Foley catheter, Swan-Ganz, A-line, mediastinal and left pleural tube   LOCAL MEDICATIONS USED:  NONE  SPECIMEN:  No Specimen  DISPOSITION OF SPECIMEN:  N/A  COUNTS:  YES  TOURNIQUET:  * No tourniquets in log *  DICTATION: .Dragon Dictation  PLAN OF CARE: Patient is already admission  PATIENT DISPOSITION:  ICU - intubated and hemodynamically stable.   Delay start of Pharmacological VTE agent (>24hrs) due to surgical blood loss or risk of bleeding: yes

## 2017-05-22 NOTE — Anesthesia Procedure Notes (Signed)
Arterial Line Insertion Start/End4/19/2019 6:44 AM, 05/22/2017 6:27 AM Performed by: Rachel MouldsLee, Heather B, CRNA, CRNA  Preanesthetic checklist: patient identified, IV checked, site marked, risks and benefits discussed, surgical consent, monitors and equipment checked, pre-op evaluation, timeout performed and anesthesia consent Left, radial was placed Catheter size: 20 G Hand hygiene performed  and maximum sterile barriers used  Allen's test indicative of satisfactory collateral circulation Attempts: 1 Procedure performed without using ultrasound guided technique. Following insertion, dressing applied and Biopatch. Post procedure assessment: normal  Patient tolerated the procedure well with no immediate complications.

## 2017-05-22 NOTE — Progress Notes (Signed)
TCTS BRIEF SICU PROGRESS NOTE  Day of Surgery  S/P Procedure(s) (LRB): CORONARY ARTERY BYPASS GRAFTING (CABG) x 2 WITH ENDOSCOPIC HARVESTING OF RIGHT SAPHENOUS VEIN (N/A) TRANSESOPHAGEAL ECHOCARDIOGRAM (TEE) (N/A)   Extubated uneventfully Breathing comfortably w/ O2 sats 97% NSR w/ stable hemodynamics, no drips except NTG for hypertension Chest tube output low UOP marginal but adequate Labs okay  Plan: Continue routine early postop  Purcell Nailslarence H Owen, MD 05/22/2017 8:06 PM

## 2017-05-22 NOTE — Transfer of Care (Signed)
Immediate Anesthesia Transfer of Care Note  Patient: Audrey Wolf  Procedure(s) Performed: CORONARY ARTERY BYPASS GRAFTING (CABG) x 2 WITH ENDOSCOPIC HARVESTING OF RIGHT SAPHENOUS VEIN (N/A Chest) TRANSESOPHAGEAL ECHOCARDIOGRAM (TEE) (N/A )  Patient Location: ICU  Anesthesia Type:General  Level of Consciousness: Patient remains intubated per anesthesia plan  Airway & Oxygen Therapy: Patient remains intubated per anesthesia plan and Patient placed on Ventilator (see vital sign flow sheet for setting)  Post-op Assessment: Report given to RN and Post -op Vital signs reviewed and stable  Post vital signs: Reviewed and stable  Last Vitals:  Vitals Value Taken Time  BP 147/58 05/22/2017 12:53 PM  Temp 36.3 C 05/22/2017 12:59 PM  Pulse 80 05/22/2017 12:59 PM  Resp 12 05/22/2017 12:59 PM  SpO2 90 % 05/22/2017 12:59 PM  Vitals shown include unvalidated device data.  Last Pain:  Vitals:   05/22/17 0357  TempSrc: Oral  PainSc:          Complications: No apparent anesthesia complications

## 2017-05-22 NOTE — Anesthesia Procedure Notes (Signed)
Procedure Name: Intubation Date/Time: 05/22/2017 7:48 AM Performed by: Glynda Jaeger, CRNA Pre-anesthesia Checklist: Patient identified, Patient being monitored, Timeout performed, Emergency Drugs available and Suction available Patient Re-evaluated:Patient Re-evaluated prior to induction Oxygen Delivery Method: Circle System Utilized Preoxygenation: Pre-oxygenation with 100% oxygen Induction Type: IV induction Laryngoscope Size: Mac and 3 Grade View: Grade II Tube type: Oral Tube size: 8.0 mm Number of attempts: 1 Airway Equipment and Method: Stylet Placement Confirmation: ETT inserted through vocal cords under direct vision,  positive ETCO2 and breath sounds checked- equal and bilateral Secured at: 21 cm Tube secured with: Tape Dental Injury: Teeth and Oropharynx as per pre-operative assessment

## 2017-05-23 ENCOUNTER — Inpatient Hospital Stay (HOSPITAL_COMMUNITY): Payer: BLUE CROSS/BLUE SHIELD

## 2017-05-23 LAB — BASIC METABOLIC PANEL
Anion gap: 9 (ref 5–15)
BUN: 18 mg/dL (ref 6–20)
CALCIUM: 7.9 mg/dL — AB (ref 8.9–10.3)
CHLORIDE: 108 mmol/L (ref 101–111)
CO2: 22 mmol/L (ref 22–32)
CREATININE: 1.06 mg/dL — AB (ref 0.44–1.00)
GFR calc Af Amer: >60 mL/min (ref >60)
GFR calc non Af Amer: 53 mL/min — ABNORMAL LOW (ref >60)
Glucose, Bld: 138 mg/dL — ABNORMAL HIGH (ref 65–99)
Potassium: 3.7 mmol/L (ref 3.5–5.1)
SODIUM: 139 mmol/L (ref 135–145)

## 2017-05-23 LAB — CBC
HCT: 28.8 % — ABNORMAL LOW (ref 36.0–46.0)
HCT: 28.8 % — ABNORMAL LOW (ref 36.0–46.0)
Hemoglobin: 9.6 g/dL — ABNORMAL LOW (ref 12.0–15.0)
Hemoglobin: 9.6 g/dL — ABNORMAL LOW (ref 12.0–15.0)
MCH: 30.3 pg (ref 26.0–34.0)
MCH: 30.5 pg (ref 26.0–34.0)
MCHC: 33.3 g/dL (ref 30.0–36.0)
MCHC: 33.3 g/dL (ref 30.0–36.0)
MCV: 90.9 fL (ref 78.0–100.0)
MCV: 91.4 fL (ref 78.0–100.0)
PLATELETS: 145 10*3/uL — AB (ref 150–400)
PLATELETS: 133 10*3/uL — AB (ref 150–400)
RBC: 3.17 MIL/uL — ABNORMAL LOW (ref 3.87–5.11)
RBC: 3.15 MIL/uL — ABNORMAL LOW (ref 3.87–5.11)
RDW: 14.6 % (ref 11.5–15.5)
RDW: 14.6 % (ref 11.5–15.5)
WBC: 13.2 10*3/uL — ABNORMAL HIGH (ref 4.0–10.5)
WBC: 12.6 10*3/uL — AB (ref 4.0–10.5)

## 2017-05-23 LAB — POCT I-STAT, CHEM 8
BUN: 12 mg/dL (ref 6–20)
Calcium, Ion: 0.99 mmol/L — ABNORMAL LOW (ref 1.15–1.40)
Chloride: 105 mmol/L (ref 101–111)
Creatinine, Ser: 0.7 mg/dL (ref 0.44–1.00)
Glucose, Bld: 175 mg/dL — ABNORMAL HIGH (ref 65–99)
HCT: 21 % — ABNORMAL LOW (ref 36.0–46.0)
Hemoglobin: 7.1 g/dL — ABNORMAL LOW (ref 12.0–15.0)
Potassium: 2.6 mmol/L — CL (ref 3.5–5.1)
Sodium: 140 mmol/L (ref 135–145)
TCO2: 19 mmol/L — ABNORMAL LOW (ref 22–32)

## 2017-05-23 LAB — GLUCOSE, CAPILLARY
Glucose-Capillary: 121 mg/dL — ABNORMAL HIGH (ref 65–99)
Glucose-Capillary: 133 mg/dL — ABNORMAL HIGH (ref 65–99)
Glucose-Capillary: 153 mg/dL — ABNORMAL HIGH (ref 65–99)
Glucose-Capillary: 101 mg/dL — ABNORMAL HIGH (ref 65–99)
Glucose-Capillary: 115 mg/dL — ABNORMAL HIGH (ref 65–99)

## 2017-05-23 LAB — CREATININE, SERUM
Creatinine, Ser: 1.14 mg/dL — ABNORMAL HIGH (ref 0.44–1.00)
GFR calc non Af Amer: 49 mL/min — ABNORMAL LOW (ref >60)
GFR, EST AFRICAN AMERICAN: 56 mL/min — AB (ref >60)

## 2017-05-23 LAB — MAGNESIUM
MAGNESIUM: 2.6 mg/dL — AB (ref 1.7–2.4)
Magnesium: 2.4 mg/dL (ref 1.7–2.4)

## 2017-05-23 MED ORDER — INSULIN ASPART 100 UNIT/ML ~~LOC~~ SOLN
0.0000 [IU] | SUBCUTANEOUS | Status: DC
  Administered 2017-05-23: 2 [IU] via SUBCUTANEOUS
  Administered 2017-05-23: 4 [IU] via SUBCUTANEOUS

## 2017-05-23 MED ORDER — POTASSIUM CHLORIDE 10 MEQ/50ML IV SOLN
10.0000 meq | INTRAVENOUS | Status: AC
  Administered 2017-05-23 (×5): 10 meq via INTRAVENOUS
  Filled 2017-05-23 (×2): qty 50

## 2017-05-23 MED ORDER — FUROSEMIDE 10 MG/ML IJ SOLN
20.0000 mg | Freq: Four times a day (QID) | INTRAMUSCULAR | Status: AC
  Administered 2017-05-23 (×3): 20 mg via INTRAVENOUS
  Filled 2017-05-23 (×3): qty 2

## 2017-05-23 MED ORDER — ENOXAPARIN SODIUM 30 MG/0.3ML ~~LOC~~ SOLN
30.0000 mg | Freq: Every day | SUBCUTANEOUS | Status: DC
  Administered 2017-05-24 – 2017-05-26 (×3): 30 mg via SUBCUTANEOUS
  Filled 2017-05-23 (×3): qty 0.3

## 2017-05-23 NOTE — Progress Notes (Signed)
TCTS BRIEF SICU PROGRESS NOTE  1 Day Post-Op  S/P Procedure(s) (LRB): CORONARY ARTERY BYPASS GRAFTING (CABG) x 2 WITH ENDOSCOPIC HARVESTING OF RIGHT SAPHENOUS VEIN (N/A) TRANSESOPHAGEAL ECHOCARDIOGRAM (TEE) (N/A)   Stable day NSR w/ stable BP Breathing comfortably w/ O2 sats 92-93% Diuresing well  Plan: Continue current plan  Audrey Nailslarence H Mikhai Bienvenue, MD 05/23/2017 7:08 PM

## 2017-05-23 NOTE — Progress Notes (Signed)
[address]       [name] [address]             [phone]        CARDIOTHORACIC SURGERY PROGRESS NOTE   R1 Day Post-Op Procedure(s) (LRB): CORONARY ARTERY BYPASS GRAFTING (CABG) x 2 WITH ENDOSCOPIC HARVESTING OF RIGHT SAPHENOUS VEIN (N/A) TRANSESOPHAGEAL ECHOCARDIOGRAM (TEE) (N/A)  Subjective: No complaints except mild soreness in chest.  No SOB.  No nausea.  Objective: Vital signs: BP Readings from Last 1 Encounters:  05/23/17 138/66   Pulse Readings from Last 1 Encounters:  05/23/17 (!) 102   Resp Readings from Last 1 Encounters:  05/23/17 16   Temp Readings from Last 1 Encounters:  05/23/17 97.9 F (36.6 C)    Hemodynamics: PAP: (19-38)/(5-26) 19/8 CO:  [6.1 L/min-9.1 L/min] 8.5 L/min CI:  [2.9 L/min/m2-4.3 L/min/m2] 4 L/min/m2  Physical Exam:  Rhythm:   sinus  Breath sounds: clear  Heart sounds:  RRR  Incisions:  Dressings dry, intact  Abdomen:  Soft, non-distended, non-tender  Extremities:  Warm, well-perfused  Chest tubes:  low volume thin serosanguinous output, no air leak    Intake/Output from previous day: 04/19 0701 - 04/20 0700 In: 5008.2 [I.V.:3048.2; Blood:360; IV Piggyback:1600] Out: 2290 [Urine:860; Blood:900; Chest Tube:530] Intake/Output this shift: Total I/O In: 263.3 [I.V.:263.3] Out: 250 [Urine:150; Chest Tube:100]  Lab Results:  CBC: Recent Labs    05/22/17 1746 05/22/17 1752 05/23/17 0422  WBC 14.7*  --  13.2*  HGB 10.3* 9.9* 9.6*  HCT 31.6* 29.0* 28.8*  PLT 159  --  145*    BMET:  Recent Labs    05/22/17 0704  05/22/17 1752 05/23/17 0422  NA 137   < > 140 139  K 3.5   < > 3.9 3.7  CL 104   < > 108 108  CO2 22  --   --  22  GLUCOSE 95   < > 138* 138*  BUN 15   < > 18 18  CREATININE 1.04*   < > 1.00 1.06*  CALCIUM 8.3*  --   --  7.9*   < > = values in this interval not displayed.     PT/INR:   Recent Labs    05/22/17 1257  LABPROT 16.2*  INR 1.31    CBG (last 3)  Recent  Labs    05/22/17 2310 05/23/17 0356 05/23/17 0844  GLUCAP 138* 121* 133*    ABG    Component Value Date/Time   PHART 7.328 (L) 05/22/2017 1747   PCO2ART 33.8 05/22/2017 1747   PO2ART 67.0 (L) 05/22/2017 1747   HCO3 17.9 (L) 05/22/2017 1747   TCO2 19 (L) 05/22/2017 1752   ACIDBASEDEF 7.0 (H) 05/22/2017 1747   O2SAT 92.0 05/22/2017 1747    CXR: PORTABLE CHEST 1 VIEW  COMPARISON:  05/22/2017  FINDINGS: Sequelae of recent CABG are again identified. Endotracheal tube and enteric tube have been removed. Right Swan-Ganz catheter terminates over the main pulmonary outflow tract. Left chest tube remains in place and appears to have been slightly retracted in the interim. Bibasilar opacities are stable to slightly increased and likely represent atelectasis. There are likely small bilateral pleural effusions. No pneumothorax is identified.  IMPRESSION: 1. Interval extubation. 2. Persistent bibasilar atelectasis and likely small pleural effusions.   Electronically Signed   By: Sebastian Ache M.D.   On: 05/23/2017 07:42  EKG: NSR w/out acute ischemic changes    Assessment/Plan: S/P Procedure(s) (LRB):  CORONARY ARTERY BYPASS GRAFTING (CABG) x 2 WITH ENDOSCOPIC HARVESTING OF RIGHT SAPHENOUS VEIN (N/A) TRANSESOPHAGEAL ECHOCARDIOGRAM (TEE) (N/A)  Doing well POD1 Maintaining NSR w/ stable hemodynamics on dopamine, milrinone and NTG for hypertension Breathing comfortably w/ O2 sats 95% on 6 L/min Expected post op acute blood loss anemia, mild Expected post op atelectasis, mild Preop acute systolic CHF with expected post-op volume excess, weight reportedly 8-9 lbs > preop   Mobilize  Diuresis  Stop dopamine  Wean milrinone slowly D/C tubes later today or tomorrow, depending on output   Purcell Nailslarence H Jamy Whyte, MD 05/23/2017 9:47 AM

## 2017-05-24 ENCOUNTER — Inpatient Hospital Stay (HOSPITAL_COMMUNITY): Payer: BLUE CROSS/BLUE SHIELD

## 2017-05-24 DIAGNOSIS — Z951 Presence of aortocoronary bypass graft: Secondary | ICD-10-CM

## 2017-05-24 LAB — CBC
HCT: 28.2 % — ABNORMAL LOW (ref 36.0–46.0)
Hemoglobin: 9.2 g/dL — ABNORMAL LOW (ref 12.0–15.0)
MCH: 30 pg (ref 26.0–34.0)
MCHC: 32.6 g/dL (ref 30.0–36.0)
MCV: 91.9 fL (ref 78.0–100.0)
PLATELETS: 119 10*3/uL — AB (ref 150–400)
RBC: 3.07 MIL/uL — AB (ref 3.87–5.11)
RDW: 14.9 % (ref 11.5–15.5)
WBC: 10.1 10*3/uL (ref 4.0–10.5)

## 2017-05-24 LAB — BASIC METABOLIC PANEL
Anion gap: 9 (ref 5–15)
BUN: 15 mg/dL (ref 6–20)
CHLORIDE: 104 mmol/L (ref 101–111)
CO2: 24 mmol/L (ref 22–32)
CREATININE: 1.04 mg/dL — AB (ref 0.44–1.00)
Calcium: 8.1 mg/dL — ABNORMAL LOW (ref 8.9–10.3)
GFR calc Af Amer: >60 mL/min (ref >60)
GFR calc non Af Amer: 54 mL/min — ABNORMAL LOW (ref >60)
GLUCOSE: 103 mg/dL — AB (ref 65–99)
Potassium: 4.1 mmol/L (ref 3.5–5.1)
Sodium: 137 mmol/L (ref 135–145)

## 2017-05-24 LAB — GLUCOSE, CAPILLARY
Glucose-Capillary: 111 mg/dL — ABNORMAL HIGH (ref 65–99)
Glucose-Capillary: 161 mg/dL — ABNORMAL HIGH (ref 65–99)
Glucose-Capillary: 90 mg/dL (ref 65–99)
Glucose-Capillary: 122 mg/dL — ABNORMAL HIGH (ref 65–99)
Glucose-Capillary: 94 mg/dL (ref 65–99)

## 2017-05-24 MED ORDER — SODIUM CHLORIDE 0.9% FLUSH: 3.0000 mL | INTRAVENOUS | Status: DC | PRN

## 2017-05-24 MED ORDER — INSULIN ASPART 100 UNIT/ML ~~LOC~~ SOLN
0.0000 [IU] | Freq: Three times a day (TID) | SUBCUTANEOUS | Status: DC
  Administered 2017-05-24: 2 [IU] via SUBCUTANEOUS
  Administered 2017-05-24: 4 [IU] via SUBCUTANEOUS

## 2017-05-24 MED ORDER — AMLODIPINE BESYLATE 10 MG PO TABS
10.0000 mg | ORAL_TABLET | Freq: Every day | ORAL | Status: DC
  Administered 2017-05-24 – 2017-05-27 (×4): 10 mg via ORAL
  Filled 2017-05-24 (×4): qty 1

## 2017-05-24 MED ORDER — LOSARTAN POTASSIUM 25 MG PO TABS: 25.0000 mg | ORAL_TABLET | Freq: Every day | ORAL | Status: DC

## 2017-05-24 MED ORDER — LOSARTAN POTASSIUM 25 MG PO TABS
25.0000 mg | ORAL_TABLET | Freq: Every day | ORAL | Status: DC
  Administered 2017-05-25: 25 mg via ORAL
  Filled 2017-05-24: qty 1

## 2017-05-24 MED ORDER — POTASSIUM CHLORIDE CRYS ER 20 MEQ PO TBCR
20.0000 meq | EXTENDED_RELEASE_TABLET | Freq: Every day | ORAL | Status: DC
  Administered 2017-05-24 – 2017-05-26 (×3): 20 meq via ORAL
  Filled 2017-05-24 (×2): qty 1

## 2017-05-24 MED ORDER — MOVING RIGHT ALONG BOOK
Freq: Once | Status: AC
  Administered 2017-05-24: 09:00:00
  Filled 2017-05-24: qty 1

## 2017-05-24 MED ORDER — SODIUM CHLORIDE 0.9% FLUSH
3.0000 mL | Freq: Two times a day (BID) | INTRAVENOUS | Status: DC
  Administered 2017-05-24 – 2017-05-27 (×4): 3 mL via INTRAVENOUS

## 2017-05-24 MED ORDER — FUROSEMIDE 10 MG/ML IJ SOLN
40.0000 mg | Freq: Two times a day (BID) | INTRAMUSCULAR | Status: AC
  Administered 2017-05-24 (×2): 40 mg via INTRAVENOUS
  Filled 2017-05-24 (×2): qty 4

## 2017-05-24 MED ORDER — METOPROLOL TARTRATE 25 MG PO TABS
25.0000 mg | ORAL_TABLET | Freq: Two times a day (BID) | ORAL | Status: DC
  Administered 2017-05-24 – 2017-05-26 (×6): 25 mg via ORAL
  Filled 2017-05-24 (×7): qty 1

## 2017-05-24 MED ORDER — FUROSEMIDE 40 MG PO TABS
40.0000 mg | ORAL_TABLET | Freq: Every day | ORAL | Status: DC
  Administered 2017-05-25 – 2017-05-27 (×3): 40 mg via ORAL
  Filled 2017-05-24 (×3): qty 1

## 2017-05-24 MED ORDER — SODIUM CHLORIDE 0.9 % IV SOLN: 250.0000 mL | INTRAVENOUS | Status: DC | PRN

## 2017-05-24 NOTE — Discharge Instructions (Signed)

## 2017-05-24 NOTE — Progress Notes (Signed)
[address]       [place] [id number]             [phone]        CARDIOTHORACIC SURGERY PROGRESS NOTE   R2 Days Post-Op Procedure(s) (LRB): CORONARY ARTERY BYPASS GRAFTING (CABG) x 2 WITH ENDOSCOPIC HARVESTING OF RIGHT SAPHENOUS VEIN (N/A) TRANSESOPHAGEAL ECHOCARDIOGRAM (TEE) (N/A)  Subjective: Looks good and feels well.  Minimal pain.  No SOB  Objective: Vital signs: BP Readings from Last 1 Encounters:  05/24/17 132/66   Pulse Readings from Last 1 Encounters:  05/24/17 (!) 106   Resp Readings from Last 1 Encounters:  05/24/17 18   Temp Readings from Last 1 Encounters:  05/24/17 98.2 F (36.8 C) (Oral)    Hemodynamics: PAP: (19-23)/(7-9) 23/9  Physical Exam:  Rhythm:   sinus  Breath sounds: clear  Heart sounds:  RRR  Incisions:  Dressings dry, intact  Abdomen:  Soft, non-distended, non-tender  Extremities:  Warm, well-perfused    Intake/Output from previous day: 04/20 0701 - 04/21 0700 In: 1318.4 [I.V.:668.4; IV Piggyback:650] Out: 2625 [Urine:2525; Chest Tube:100] Intake/Output this shift: No intake/output data recorded.  Lab Results:  CBC: Recent Labs    05/23/17 1648 05/24/17 0351  WBC 12.6* 10.1  HGB 9.6*  7.1* 9.2*  HCT 28.8*  21.0* 28.2*  PLT 133* 119*    BMET:  Recent Labs    05/23/17 0422 05/23/17 1648 05/24/17 0351  NA 139 140 137  K 3.7 2.6* 4.1  CL 108 105 104  CO2 22  --  24  GLUCOSE 138* 175* 103*  BUN 18 12 15   CREATININE 1.06* 1.14*  0.70 1.04*  CALCIUM 7.9*  --  8.1*     PT/INR:   Recent Labs    05/22/17 1257  LABPROT 16.2*  INR 1.31    CBG (last 3)  Recent Labs    05/23/17 1937 05/23/17 2324 05/24/17 0339  GLUCAP 101* 115* 111*    ABG    Component Value Date/Time   PHART 7.328 (L) 05/22/2017 1747   PCO2ART 33.8 05/22/2017 1747   PO2ART 67.0 (L) 05/22/2017 1747   HCO3 17.9 (L) 05/22/2017 1747   TCO2 19 (L) 05/23/2017 1648   ACIDBASEDEF 7.0 (H) 05/22/2017 1747   O2SAT 92.0 05/22/2017 1747    CXR: PORTABLE CHEST 1 VIEW  COMPARISON:  05/23/2017  FINDINGS: Sequelae of recent CABG are again identified. Swan-Ganz catheter and left chest tube have been removed. A right jugular sheath remains. The cardiac silhouette remains enlarged. There are persistent pleural effusions and bibasilar opacities suggesting atelectasis with overall mild worsening from the prior study. No pneumothorax is identified.  IMPRESSION: Bilateral pleural effusions with worsening bibasilar lung aeration.   Electronically Signed   By: Sebastian AcheAllen  Grady M.D.   On: 05/24/2017 07:09  Assessment/Plan: S/P Procedure(s) (LRB): CORONARY ARTERY BYPASS GRAFTING (CABG) x 2 WITH ENDOSCOPIC HARVESTING OF RIGHT SAPHENOUS VEIN (N/A) TRANSESOPHAGEAL ECHOCARDIOGRAM (TEE) (N/A)  Doing well POD2 Maintaining NSR w/ stable BP although still on low dose NTG for hypertension Breathing comfortably w/ O2 sats 96% on 3 L/min Expected post op acute blood loss anemia, mild Expected post op atelectasis, mild Preop acute systolic CHF with expected post-op volume excess, weight reportedly down 3 lbs but still > preop   Mobilize  Diuresis  Increase metoprolol  Restart Norvasc  Restart ARB tomorrow if BP still somewhat elevated  Transfer step down   Purcell Nailslarence H Jasie Meleski, MD 05/24/2017 7:54 AM

## 2017-05-24 NOTE — Progress Notes (Signed)
TCTS BRIEF SICU PROGRESS NOTE  2 Days Post-Op  S/P Procedure(s) (LRB): CORONARY ARTERY BYPASS GRAFTING (CABG) x 2 WITH ENDOSCOPIC HARVESTING OF RIGHT SAPHENOUS VEIN (N/A) TRANSESOPHAGEAL ECHOCARDIOGRAM (TEE) (N/A)   Stable day  Plan: Awaiting bed for transfer  Purcell Nailslarence H Owen, MD 05/24/2017 8:48 PM

## 2017-05-24 NOTE — Discharge Summary (Addendum)
Physician Discharge Summary  Patient ID: Audrey Wolf MRN: [id number] DOB/AGE: [birth year] 67 y.o.  Admit date: 05/19/2017 Discharge date: 05/27/2017  Admission Diagnoses:  Patient Active Problem List   Diagnosis Date Noted  . CAD (coronary artery disease), native coronary artery 05/19/2017  . Pleural effusion 05/16/2017  . Elevated troponin I level 05/16/2017  . NSTEMI (non-ST elevated myocardial infarction) (HCC) 05/16/2017  . CHF (congestive heart failure) (HCC) 05/16/2017  . SOB (shortness of breath) 04/20/2017  . Bronchitis 04/20/2017  . Periumbilical abdominal pain 10/23/2016  . Hypertension 06/05/2016  . Hyperlipidemia 06/05/2016  . GERD (gastroesophageal reflux disease) 06/05/2016  . CKD (chronic kidney disease) stage 2, GFR 60-89 ml/min 06/05/2016  . Ureteral stricture, left 03/29/2014  . Unilateral small kidney 03/02/2013   Discharge Diagnoses:   Patient Active Problem List   Diagnosis Date Noted  . 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  . CAD (coronary artery disease), native coronary artery 05/19/2017  . CAD (coronary artery disease) 05/19/2017  . Pleural effusion 05/16/2017  . Elevated troponin I level 05/16/2017  . NSTEMI (non-ST elevated myocardial infarction) (HCC) 05/16/2017  . CHF (congestive heart failure) (HCC) 05/16/2017  . SOB (shortness of breath) 04/20/2017  . Bronchitis 04/20/2017  . Periumbilical abdominal pain 10/23/2016  . Hypertension 06/05/2016  . Hyperlipidemia 06/05/2016  . GERD (gastroesophageal reflux disease) 06/05/2016  . CKD (chronic kidney disease) stage 2, GFR 60-89 ml/min 06/05/2016  . Ureteral stricture, left 03/29/2014  . Unilateral small kidney 03/02/2013   Discharged Condition: good  History of Present Illness:  Audrey Wolf is a 67 yo obese white female with PMH of Hypertension, Hyperlipidemia, COPD/Asthma, kidney stones, CKD, GERD, and CHF.  She has had several health issues recently including URI and viral GI illness.  Since  recovering from those the patient noticed has been experiencing increased fatigue, malaise, and reduced exercise tolerance.  She is short of breath at baseline, but this became more severe prompting the patient to present to the ED on 05/15/2017.  CT scan obtained in the ED showed bilateral pleural effusions.  Troponin level was elevated at 0.41.  EKG was obtained and showed evidence of ischemia.  Cardiology consult was obtained and they ultimately felt th patient should undergo cardiac catheterization.  This was done on 05/18/2017 and showed a 70% ostial LM lesion.  It was felt she should undergo bypass surgery and she was transferred to Audrey Wolf for further care.    Hospital Course:   The patient remained chest pain free during hospitalization.  She did have several episodes of diarrhea which resolved without issue.  She was hydrated prior to surgery due to her baseline CKD.  She was medically stable and taken to the operating room on 05/22/2017.  She underwent CABG x 2 utilizing LIMA to LAD and SVG to OM.  She also underwent endoscopic harvest of greater saphenous vein from her right thigh.  She tolerated the procedure without difficulty and was taken to the SICU in stable condition.  She was extubated the evening of surgery.  During her stay in the SICU, she was weaned off NTG, Dopamine, and Milrinone drip as hemodynamics allowed.  Her chest tubes and arterial lines were removed without difficulty.  She was restarted on home Norvasc to help control blood pressure.  She was started on diuretics for mild volume overload.  She was maintaining NSR and felt medically stable for transfer to the step down unit for further care on 05/25/2017. Epicardial pacing wires were removed on  05/25/2017. She had a brief episode of a fib on 04/23. Her Lopressor was increased and she was given potassium supplementation. She was still slightly tachycardic and hypertensive. As a result, I increased her Lopressor to 50 mg  bid. Her HR is in the 90's to occasional low 100's and SBP is in the 130's as of 11:50 am (after begin given Lopressor 50 mg and Losartan 50 mg). We will not make any further adjustments to her blood pressure medications at this time. If her systolic blood pressure continues to increase, we can increase her Losartan. She has been ambulating on room air. She has been tolerating a diet and has had multiple bowel movements. Her wounds are clean and dry. There are no signs of infection. Chest tube sutures will be removed today.As discussed with Audrey Wolf, will discharge later today.  Significant Diagnostic Studies: angiography:    Ost LM lesion is 70% stenosed.   High grade osteal left main disease with dampening of pressures with 4 mm st depressions on monitor lateral and anterior leads. Borderline LV systolic function  Treatments: surgery:   CORONARY ARTERY BYPASS GRAFTING x 2 -LIMA TO LAD -SVG TO OM   ENDOSCOPIC HARVESTING OF RIGHT SAPHENOUS VEIN  -Right thigh  TRANSESOPHAGEAL ECHOCARDIOGRAM (TEE) (N/A)  Discharge Exam: Blood pressure (!) 161/74, pulse (!) 102, temperature 98.4 F (36.9 C), temperature source Oral, resp. rate 19, height 5\' 9"  (1.753 m), weight 210 lb (95.3 kg), SpO2 91 %.  Cardiovascular: Slightly tachycardic Pulmonary: Slightly diminished at bases Abdomen: Soft, non tender, bowel sounds present. Extremities: Mild bilateral lower extremity edema. Wounds: Clean and dry.  No erythema or signs of infection.   Disposition: Home  Discharge Medications:   Allergies as of 05/27/2017      Reactions   Lisinopril Cough   Ace Inhibitors Other (See Comments)   Has been told to avoid these and any diuretics because "left kidney has shrunk up and doesn't work"      Medication List    STOP taking these medications   cloNIDine 0.3 mg/24hr patch Commonly known as:  CATAPRES - Dosed in mg/24 hr   heparin 100-0.45 UNIT/ML-% infusion   hydrALAZINE 100 MG  tablet Commonly known as:  APRESOLINE     TAKE these medications   acetaminophen 500 MG tablet Commonly known as:  TYLENOL Take 1 tablet (500 mg total) by mouth daily as needed for mild pain or headache. What changed:    when to take this  reasons to take this   albuterol 108 (90 Base) MCG/ACT inhaler Commonly known as:  PROVENTIL HFA;VENTOLIN HFA Inhale 2 puffs into the lungs every 6 (six) hours as needed for wheezing or shortness of breath.   amLODipine 10 MG tablet Commonly known as:  NORVASC Take 10 mg by mouth daily.   aspirin 325 MG EC tablet Take 1 tablet (325 mg total) by mouth daily. What changed:    medication strength  how much to take  when to take this   budesonide-formoterol 80-4.5 MCG/ACT inhaler Commonly known as:  SYMBICORT Inhale 2 puffs into the lungs 2 (two) times daily.   furosemide 40 MG tablet Commonly known as:  LASIX Take 1 tablet (40 mg total) by mouth daily. For 5 days then stop. What changed:    medication strength  how much to take  additional instructions   loratadine 10 MG tablet Commonly known as:  CLARITIN Take 10 mg by mouth daily.   losartan 50 MG tablet Commonly known  as:  COZAAR Take 1 tablet (50 mg total) by mouth daily. What changed:    medication strength  how much to take   metoprolol tartrate 50 MG tablet Commonly known as:  LOPRESSOR Take 1 tablet (50 mg total) by mouth 2 (two) times daily.   oxyCODONE 5 MG immediate release tablet Commonly known as:  Oxy IR/ROXICODONE Take 5 mg by mouth every 4-6 hours PRN severe pain   potassium chloride SA 20 MEQ tablet Commonly known as:  K-DUR,KLOR-CON Take 1 tablet (20 mEq total) by mouth daily with breakfast. For 5 days then stop Start taking on:  05/28/2017   rosuvastatin 20 MG tablet Commonly known as:  CRESTOR Take 1 tablet (20 mg total) by mouth daily.   ZANTAC PO Take 150 mg by mouth daily.      The patient has been discharged on:   1.Beta  Blocker:  Yes [ x  ]                              No   [   ]                              If No, reason:  2.Ace Inhibitor/ARB: Yes [ x  ]                                     No  [    ]                                     If No, reason:  3.Statin:   Yes [x   ]                  No  [   ]                  If No, reason:  4.Ecasa:  Yes  [  x ]                  No   [   ]                  If No, reason:  Follow-up Information    Delight Ovens, MD. Go on 06/22/2017.   Specialty:  Cardiothoracic Surgery Why:  Appointment is with physician assistant. PA/LAT CXR to be taken (at [facility] which is in the same building as Dr. Dennie Maizes office) on 06/22/2017 at 1:30 pm;Appointment time is at 2:00 pm Contact information: [address] [place] [address] [phone]        Laurier Nancy, MD. Schedule an appointment as soon as possible for a visit.   Specialty:  Cardiology Why:  Please contact at discharge to set up 2 week follow up Contact information: [address] [place] [address] [phone]           Signed: Elenore Rota 05/27/2017, 9:16 AM

## 2017-05-25 ENCOUNTER — Encounter (HOSPITAL_COMMUNITY): Payer: Self-pay | Admitting: Cardiothoracic Surgery

## 2017-05-25 ENCOUNTER — Inpatient Hospital Stay (HOSPITAL_COMMUNITY): Payer: BLUE CROSS/BLUE SHIELD

## 2017-05-25 LAB — BASIC METABOLIC PANEL
ANION GAP: 9 (ref 5–15)
BUN: 14 mg/dL (ref 6–20)
CALCIUM: 8.2 mg/dL — AB (ref 8.9–10.3)
CO2: 23 mmol/L (ref 22–32)
Chloride: 104 mmol/L (ref 101–111)
Creatinine, Ser: 0.91 mg/dL (ref 0.44–1.00)
GFR calc Af Amer: >60 mL/min (ref >60)
GLUCOSE: 95 mg/dL (ref 65–99)
Potassium: 3.5 mmol/L (ref 3.5–5.1)
Sodium: 136 mmol/L (ref 135–145)

## 2017-05-25 LAB — CBC
HCT: 30.4 % — ABNORMAL LOW (ref 36.0–46.0)
Hemoglobin: 9.9 g/dL — ABNORMAL LOW (ref 12.0–15.0)
MCH: 29.9 pg (ref 26.0–34.0)
MCHC: 32.6 g/dL (ref 30.0–36.0)
MCV: 91.8 fL (ref 78.0–100.0)
PLATELETS: 145 10*3/uL — AB (ref 150–400)
RBC: 3.31 MIL/uL — ABNORMAL LOW (ref 3.87–5.11)
RDW: 15.1 % (ref 11.5–15.5)
WBC: 9.2 10*3/uL (ref 4.0–10.5)

## 2017-05-25 LAB — GLUCOSE, CAPILLARY
Glucose-Capillary: 91 mg/dL (ref 65–99)
Glucose-Capillary: 109 mg/dL — ABNORMAL HIGH (ref 65–99)
Glucose-Capillary: 121 mg/dL — ABNORMAL HIGH (ref 65–99)
Glucose-Capillary: 114 mg/dL — ABNORMAL HIGH (ref 65–99)
Glucose-Capillary: 100 mg/dL — ABNORMAL HIGH (ref 65–99)

## 2017-05-25 MED ORDER — POTASSIUM CHLORIDE CRYS ER 20 MEQ PO TBCR
30.0000 meq | EXTENDED_RELEASE_TABLET | Freq: Once | ORAL | Status: AC
  Administered 2017-05-25: 30 meq via ORAL
  Filled 2017-05-25: qty 1

## 2017-05-25 MED FILL — Heparin Sodium (Porcine) Inj 1000 Unit/ML: INTRAMUSCULAR | Qty: 30 | Status: AC

## 2017-05-25 MED FILL — Dexmedetomidine HCl in NaCl 0.9% IV Soln 200 MCG/50ML: INTRAVENOUS | Qty: 50 | Status: AC

## 2017-05-25 MED FILL — Magnesium Sulfate Inj 50%: INTRAMUSCULAR | Qty: 10 | Status: AC

## 2017-05-25 MED FILL — Potassium Chloride Inj 2 mEq/ML: INTRAVENOUS | Qty: 40 | Status: AC

## 2017-05-25 NOTE — Progress Notes (Addendum)
Patient ID: Audrey BimlerKatherine R Wolf, female   DOB: February 15, 1950, 67 y.o.   MRN: [id number] TCTS DAILY ICU PROGRESS NOTE                   [address]            [place] [id number]          [phone]   3 Days Post-Op Procedure(s) (LRB): CORONARY ARTERY BYPASS GRAFTING (CABG) x 2 WITH ENDOSCOPIC HARVESTING OF RIGHT SAPHENOUS VEIN (N/A) TRANSESOPHAGEAL ECHOCARDIOGRAM (TEE) (N/A)  Total Length of Stay:  LOS: 6 days   Subjective: Patient feels well this morning, walked around the unit without difficulty  Objective: Vital signs in last 24 hours: Temp:  [97.4 F (36.3 C)-99.1 F (37.3 C)] 97.9 F (36.6 C) (04/22 0426) Pulse Rate:  [82-106] 95 (04/22 0600) Cardiac Rhythm: Normal sinus rhythm (04/22 0400) Resp:  [16-30] 23 (04/22 0600) BP: (139-165)/(61-78) 150/75 (04/22 0600) SpO2:  [90 %-98 %] 96 % (04/22 0600) Weight:  [218 lb 11.1 oz (99.2 kg)] 218 lb 11.1 oz (99.2 kg) (04/22 0500)  Filed Weights   05/23/17 0500 05/24/17 0500 05/25/17 0500  Weight: 226 lb 13.7 oz (102.9 kg) 223 lb 12.3 oz (101.5 kg) 218 lb 11.1 oz (99.2 kg)    Weight change: -5 lb 1.1 oz (-2.3 kg)   Hemodynamic parameters for last 24 hours:    Intake/Output from previous day: 04/21 0701 - 04/22 0700 In: -  Out: 2575 [Urine:2575]  Intake/Output this shift: No intake/output data recorded.  Current Meds: Scheduled Meds: . acetaminophen  1,000 mg Oral Q6H  . amLODipine  10 mg Oral Daily  . aspirin EC  325 mg Oral Daily  . bisacodyl  10 mg Oral Daily   Or  . bisacodyl  10 mg Rectal Daily  . docusate sodium  200 mg Oral Daily  . enoxaparin (LOVENOX) injection  30 mg Subcutaneous QHS  . famotidine  10 mg Oral Daily  . furosemide  40 mg Oral Daily  . insulin aspart  0-24 Units Subcutaneous TID AC & HS  . losartan  25 mg Oral Daily  . mouth rinse  15 mL Mouth Rinse BID  . metoprolol tartrate  25 mg Oral BID  . mometasone-formoterol  2 puff Inhalation BID  . pantoprazole  40 mg Oral  Daily  . potassium chloride  20 mEq Oral Q breakfast  . rosuvastatin  20 mg Oral QHS  . sodium chloride flush  3 mL Intravenous Q12H  . sodium chloride flush  3 mL Intravenous Q12H   Continuous Infusions: . sodium chloride    . sodium chloride     PRN Meds:.sodium chloride, albuterol, metoprolol tartrate, morphine injection, ondansetron (ZOFRAN) IV, oxyCODONE, sodium chloride flush, sodium chloride flush, traMADol  General appearance: alert, cooperative and no distress Neurologic: intact Heart: regular rate and rhythm, S1, S2 normal, no murmur, click, rub or gallop Lungs: diminished breath sounds bibasilar Abdomen: soft, non-tender; bowel sounds normal; no masses,  no organomegaly Extremities: extremities normal, atraumatic, no cyanosis or edema and Homans sign is negative, no sign of DVT Wound: Sternum stable Aquacel dressing in place  Lab Results: CBC: Recent Labs    05/24/17 0351 05/25/17 0237  WBC 10.1 9.2  HGB 9.2* 9.9*  HCT 28.2* 30.4*  PLT 119* 145*   BMET:  Recent Labs    05/24/17 0351 05/25/17 0237  NA 137 136  K 4.1 3.5  CL 104 104  CO2 24 23  GLUCOSE 103* 95  BUN 15 14  CREATININE 1.04* 0.91  CALCIUM 8.1* 8.2*    CMET: Lab Results  Component Value Date   WBC 9.2 05/25/2017   HGB 9.9 (L) 05/25/2017   HCT 30.4 (L) 05/25/2017   PLT 145 (L) 05/25/2017   GLUCOSE 95 05/25/2017   CHOL 99 05/17/2017   TRIG 72 05/17/2017   HDL 39 (L) 05/17/2017   LDLCALC 46 05/17/2017   ALT 31 05/21/2017   AST 34 05/21/2017   NA 136 05/25/2017   K 3.5 05/25/2017   CL 104 05/25/2017   CREATININE 0.91 05/25/2017   BUN 14 05/25/2017   CO2 23 05/25/2017   TSH 3.58 06/05/2016   INR 1.31 05/22/2017   HGBA1C 5.5 05/19/2017   MICROALBUR 8.7 (H) 03/22/2015      PT/INR:  Recent Labs    05/22/17 1257  LABPROT 16.2*  INR 1.31   Radiology: No results found. On PA and lateral x-ray this morning bilateral pleural effusions noted  Assessment/Plan: S/P Procedure(s)  (LRB): CORONARY ARTERY BYPASS GRAFTING (CABG) x 2 WITH ENDOSCOPIC HARVESTING OF RIGHT SAPHENOUS VEIN (N/A) TRANSESOPHAGEAL ECHOCARDIOGRAM (TEE) (N/A) Plan for transfer to step-down: see transfer orders Patient still waiting for transfer to stepdown DC pacing wires today possible discharge home tomorrow Continue diuretic   Delight Ovens 05/25/2017 7:19 AM

## 2017-05-25 NOTE — Progress Notes (Signed)
Patient has refused a third walk this evening, educated patient on the need to ambulate also encouraged incentive spirometer. Will offer patient to ambulate prior to breakfast in the morning. Patient resting in bed with call bell in reached.

## 2017-05-25 NOTE — Progress Notes (Signed)
CARDIAC REHAB PHASE I   PRE:  Rate/Rhythm: 98 SR  BP:  Supine: 158/79  Sitting:   Standing:    SaO2: 93%RA  MODE:  Ambulation: 370 ft   POST:  Rate/Rhythm: 110 ST  BP:  Supine: 146/77  Sitting:   Standing:    SaO2: 94%RA 1340-1415 Pt walked 370 ft on RA with rolling walker with steady gait. Tolerated well. Back to bed and set up lunch.   Luetta Nuttingharlene Christyna Letendre, RN BSN  05/25/2017 2:08 PM

## 2017-05-26 ENCOUNTER — Inpatient Hospital Stay (HOSPITAL_COMMUNITY): Payer: BLUE CROSS/BLUE SHIELD

## 2017-05-26 LAB — BASIC METABOLIC PANEL
Anion gap: 10 (ref 5–15)
BUN: 11 mg/dL (ref 6–20)
CO2: 23 mmol/L (ref 22–32)
Calcium: 8.3 mg/dL — ABNORMAL LOW (ref 8.9–10.3)
Chloride: 107 mmol/L (ref 101–111)
Creatinine, Ser: 1 mg/dL (ref 0.44–1.00)
GFR calc Af Amer: >60 mL/min (ref >60)
GFR calc non Af Amer: 57 mL/min — ABNORMAL LOW (ref >60)
Glucose, Bld: 93 mg/dL (ref 65–99)
Potassium: 3.5 mmol/L (ref 3.5–5.1)
Sodium: 140 mmol/L (ref 135–145)

## 2017-05-26 LAB — CBC
HCT: 32.6 % — ABNORMAL LOW (ref 36.0–46.0)
Hemoglobin: 10.6 g/dL — ABNORMAL LOW (ref 12.0–15.0)
MCH: 29.9 pg (ref 26.0–34.0)
MCHC: 32.5 g/dL (ref 30.0–36.0)
MCV: 92.1 fL (ref 78.0–100.0)
Platelets: 179 10*3/uL (ref 150–400)
RBC: 3.54 MIL/uL — ABNORMAL LOW (ref 3.87–5.11)
RDW: 14.7 % (ref 11.5–15.5)
WBC: 8.6 10*3/uL (ref 4.0–10.5)

## 2017-05-26 LAB — GLUCOSE, CAPILLARY
Glucose-Capillary: 101 mg/dL — ABNORMAL HIGH (ref 65–99)
Glucose-Capillary: 103 mg/dL — ABNORMAL HIGH (ref 65–99)
Glucose-Capillary: 98 mg/dL (ref 65–99)
Glucose-Capillary: 93 mg/dL (ref 65–99)

## 2017-05-26 MED ORDER — LOSARTAN POTASSIUM 50 MG PO TABS
50.0000 mg | ORAL_TABLET | Freq: Every day | ORAL | Status: DC
  Administered 2017-05-26 – 2017-05-27 (×2): 50 mg via ORAL
  Filled 2017-05-26 (×2): qty 1

## 2017-05-26 MED ORDER — POTASSIUM CHLORIDE CRYS ER 20 MEQ PO TBCR
40.0000 meq | EXTENDED_RELEASE_TABLET | Freq: Every day | ORAL | Status: DC
  Administered 2017-05-27: 40 meq via ORAL
  Filled 2017-05-26 (×2): qty 2

## 2017-05-26 MED FILL — Sodium Chloride IV Soln 0.9%: INTRAVENOUS | Qty: 2000 | Status: AC

## 2017-05-26 MED FILL — Heparin Sodium (Porcine) Inj 1000 Unit/ML: INTRAMUSCULAR | Qty: 20 | Status: AC

## 2017-05-26 MED FILL — Mannitol IV Soln 20%: INTRAVENOUS | Qty: 500 | Status: AC

## 2017-05-26 MED FILL — Electrolyte-R (PH 7.4) Solution: INTRAVENOUS | Qty: 4000 | Status: AC

## 2017-05-26 MED FILL — Sodium Bicarbonate IV Soln 8.4%: INTRAVENOUS | Qty: 50 | Status: AC

## 2017-05-26 MED FILL — Lidocaine HCl(Cardiac) IV PF Soln Pref Syr 100 MG/5ML (2%): INTRAVENOUS | Qty: 5 | Status: AC

## 2017-05-26 NOTE — Progress Notes (Addendum)
[address]       [place] [id number]             [phone]      4 Days Post-Op Procedure(s) (LRB): CORONARY ARTERY BYPASS GRAFTING (CABG) x 2 WITH ENDOSCOPIC HARVESTING OF RIGHT SAPHENOUS VEIN (N/A) TRANSESOPHAGEAL ECHOCARDIOGRAM (TEE) (N/A) Subjective: Feels ok, had short episode of afib, now sinus, sinus tachy  Objective: Vital signs in last 24 hours: Temp:  [98.3 F (36.8 C)-99.8 F (37.7 C)] 98.9 F (37.2 C) (04/23 0800) Pulse Rate:  [87-111] 93 (04/23 0800) Cardiac Rhythm: Normal sinus rhythm (04/22 2033) Resp:  [19-24] 20 (04/23 0004) BP: (129-179)/(66-82) 161/74 (04/23 0800) SpO2:  [92 %-96 %] 93 % (04/23 0805) Weight:  [96.3 kg (212 lb 4.8 oz)] 96.3 kg (212 lb 4.8 oz) (04/23 0624)  Hemodynamic parameters for last 24 hours:    Intake/Output from previous day: 04/22 0701 - 04/23 0700 In: 240 [P.O.:240] Out: 100 [Urine:100] Intake/Output this shift: No intake/output data recorded.  General appearance: alert, cooperative and no distress Heart: regular rate and rhythm Lungs: min dim in bases Abdomen: soft, nontender Extremities: minor non-pitting edema Wound: incis healing well  Lab Results: Recent Labs    05/25/17 0237 05/26/17 0459  WBC 9.2 8.6  HGB 9.9* 10.6*  HCT 30.4* 32.6*  PLT 145* 179   BMET:  Recent Labs    05/25/17 0237 05/26/17 0459  NA 136 140  K 3.5 3.5  CL 104 107  CO2 23 23  GLUCOSE 95 93  BUN 14 11  CREATININE 0.91 1.00  CALCIUM 8.2* 8.3*    PT/INR: No results for input(s): LABPROT, INR in the last 72 hours. ABG    Component Value Date/Time   PHART 7.328 (L) 05/22/2017 1747   HCO3 17.9 (L) 05/22/2017 1747   TCO2 19 (L) 05/23/2017 1648   ACIDBASEDEF 7.0 (H) 05/22/2017 1747   O2SAT 92.0 05/22/2017 1747   CBG (last 3)  Recent Labs    05/25/17 1823 05/25/17 2210 05/26/17 0631  GLUCAP 114* 100* 101*    Meds Scheduled Meds: . acetaminophen  1,000 mg Oral Q6H  . amLODipine  10 mg Oral Daily   . aspirin EC  325 mg Oral Daily  . bisacodyl  10 mg Oral Daily   Or  . bisacodyl  10 mg Rectal Daily  . docusate sodium  200 mg Oral Daily  . enoxaparin (LOVENOX) injection  30 mg Subcutaneous QHS  . famotidine  10 mg Oral Daily  . furosemide  40 mg Oral Daily  . insulin aspart  0-24 Units Subcutaneous TID AC & HS  . losartan  25 mg Oral Daily  . mouth rinse  15 mL Mouth Rinse BID  . metoprolol tartrate  25 mg Oral BID  . mometasone-formoterol  2 puff Inhalation BID  . pantoprazole  40 mg Oral Daily  . potassium chloride  20 mEq Oral Q breakfast  . rosuvastatin  20 mg Oral QHS  . sodium chloride flush  3 mL Intravenous Q12H  . sodium chloride flush  3 mL Intravenous Q12H   Continuous Infusions: . sodium chloride    . sodium chloride     PRN Meds:.sodium chloride, albuterol, metoprolol tartrate, morphine injection, ondansetron (ZOFRAN) IV, oxyCODONE, sodium chloride flush, sodium chloride flush, traMADol  Xrays Dg Chest 2 View  Result Date: 05/25/2017 CLINICAL DATA:  Status post coronary artery bypass graft. EXAM: CHEST - 2 VIEW COMPARISON:  Radiograph of May 24, 2017. FINDINGS: Stable cardiomegaly. Sternotomy wires are noted. No pneumothorax is noted. Stable moderate bilateral pleural effusions are noted with associated atelectasis. Atherosclerosis of thoracic aorta is noted. Bony thorax is unremarkable. IMPRESSION: Stable moderate bilateral pleural effusions are noted with associated underlying atelectasis. Aortic Atherosclerosis (ICD10-I70.0). Electronically Signed   By: Lupita Raider, M.D.   On: 05/25/2017 07:59    Assessment/Plan: S/P Procedure(s) (LRB): CORONARY ARTERY BYPASS GRAFTING (CABG) x 2 WITH ENDOSCOPIC HARVESTING OF RIGHT SAPHENOUS VEIN (N/A) TRANSESOPHAGEAL ECHOCARDIOGRAM (TEE) (N/A)   1 short episode of poss afib, will cont to monitor. Will increase beta blocker dose. Replace K+ 2 HTN- will increase ARB dose 3 H/H is improved, no leukocytosis 4 renal fxn  is in normal range, cont gentle diuresis 5 cont pulm toilet and rehab 6 poss d/c in am  LOS: 7 days    Rowe Clack 05/26/2017  Poss home tomorrow Chest xray improved today but still with some effusion, home on lasix  Follow up with Dr Milta Deiters, [place] I have seen and examined Audrey Wolf and agree with the above assessment  and plan.  Delight Ovens MD Beeper [phone] Office [phone] 05/26/2017 8:59 AM

## 2017-05-26 NOTE — Op Note (Signed)
NAME: Audrey Wolf, Audrey Wolf MEDICAL RECORD AV:[id number] ACCOUNT [id number] DATE OF BIRTH:[birth year] FACILITY: [facility] LOCATION: [facility] PHYSICIAN:Aibhlinn Kalmar Bari Tenea Sens, MD  OPERATIVE REPORT  DATE OF PROCEDURE:  05/22/2017  PREOPERATIVE DIAGNOSIS:  Coronary occlusive disease with unstable angina.  POSTOPERATIVE DIAGNOSIS:  Coronary occlusive disease with unstable angina.  SURGICAL PROCEDURE:  Coronary artery bypass grafting x2 with the left internal mammary to the left anterior descending coronary artery, and reverse saphenous vein graft to the obtuse marginal coronary artery with right leg greater saphenous thigh  endovein harvesting.  SURGEON:  Sheliah Plane, MD.  FIRST ASSISTANT:  [name], PA.   BRIEF HISTORY:  The patient is a 67 year old female who presented to [facility] with primarily respiratory symptoms and chest discomfort.  She is a lifelong nonsmoker.  Cardiac catheterization was done at [facility] by Dr. Welton Flakes, which demonstrated  significant left main, LAD and circumflex disease.  The right coronary artery was free of significant stenoses.  Overall, ventricular function was preserved.  With the significant left main obstruction and 2-vessel coronary artery disease, coronary  artery bypass grafting was recommended to the patient who agreed and signed informed consent.  DESCRIPTION OF PROCEDURE:  With Swan-Ganz and arterial line monitors in place, the patient underwent general endotracheal anesthesia without incident.  Skin of the chest and legs was prepped with Betadine and draped in the usual sterile manner.   Appropriate timeout was performed and we proceeded with right thigh greater saphenous endoscopic vein harvesting.  A segment of vein was removed, was of adequate quality and caliber.  Median sternotomy was performed.  The left internal mammary artery was  dissected down as pedicle graft.  The distal artery was divided, had good free flow.  The pericardium was  opened.  Overall, ventricular function appeared preserved.  The patient was systemically heparinized.  The ascending aorta was cannulated.  The  right atrium was cannulated with a dual stage venous cannula.  The patient was then placed on cardiopulmonary bypass at 2.4 L/minute/sq m.  Sites of anastomosis were selected and dissected out.  The patient's body temperature was then cooled to 32  degrees.  Aortic crossclamp was applied and 500 mL of cold blood potassium cardioplegia was administered antegrade.  Myocardial septal temperature was monitored throughout the crossclamp.  Attention was turned first to the circumflex and the large obtuse  marginal branch.  This vessel was opened, admitted a 1.5 mm probe.  Using a running 7-0 Prolene, a distal anastomosis was performed with the segment of reverse saphenous vein graft.  Additional cold blood cardioplegia was administered down the vein  graft.  Attention was then turned to the left anterior descending coronary artery, which was opened between the mid and distal third of the vessel, a 1.5 mm probe passed proximally and distally.  Using a running 8-0 Prolene, the left internal mammary  artery was anastomosed to the left anterior descending coronary artery.  With a cross clamp still in place, a single punch aortotomy was performed.  The vein graft was anastomosed to the ascending aorta.  The bulldog was then removed from the mammary  artery with prompt rise in myocardial septal temperature.  The heart was allowed to passively fill and deair and the proximal anastomosis was completed.  The aortic crossclamp was removed with total crossclamp time of 49 minutes.  The patient  spontaneously converted to a sinus rhythm.  The body temperature was rewarmed to 37 degrees.  She was then ventilated and weaned from cardiopulmonary bypass without difficulty.  She remained hemodynamically stable.  She was decannulated in the usual  fashion.  Protamine sulfate was  administered.  With the operative field hemostatic, atrial and ventricular pacing wires were applied.  A graft marker was applied.  A left pleural tube and a Blake mediastinal drain were left in place.  The pericardium was  loosely reapproximated.  The sternum was closed with #6 stainless steel wire.  Fascia closed with interrupted 0 Vicryl, running 3-0 Vicryl in the subcutaneous tissue, 4-0 subcuticular stitch in the skin edges.  Dry dressings were applied.  Sponge and  needle count was reported as correct at completion of the procedure.  The patient tolerated the procedure without obvious complication and was transferred to the surgical intensive care unit for further postoperative care.  AN/NUANCE  D:05/26/2017 T:05/26/2017 JOB:[id number]

## 2017-05-26 NOTE — Progress Notes (Signed)
1100 Came to see pt to walk. Pt standing at sink. Stated she walked with staff without walker and getting ready to wash up. Will follow up later as time permits. Encouraged more walks with staff. Luetta NuttingCharlene Aaliyan Brinkmeier RN BSN 05/26/2017 11:06 AM

## 2017-05-26 NOTE — Progress Notes (Signed)
CARDIAC REHAB PHASE I   PRE:  Rate/Rhythm: 102 ST  BP:  Supine: 137/74  Sitting:   Standing:    SaO2: 93%RA  MODE:  Ambulation: 470 ft   POST:  Rate/Rhythm: 114 ST  BP:  Supine: 153/68  Sitting:   Standing:    SaO2: 95%RA 1325-1345 Pt walked 470 ft on RA with steady gait. No walker needed. Encouraged pt to adhere to sternal precautions. Second walk today. Encouraged third walk before sleep.    Luetta Nuttingharlene Blinda Turek, RN BSN  05/26/2017 1:43 PM

## 2017-05-27 LAB — GLUCOSE, CAPILLARY
Glucose-Capillary: 110 mg/dL — ABNORMAL HIGH (ref 65–99)
Glucose-Capillary: 103 mg/dL — ABNORMAL HIGH (ref 65–99)
Glucose-Capillary: 103 mg/dL — ABNORMAL HIGH (ref 65–99)

## 2017-05-27 MED ORDER — ACETAMINOPHEN 500 MG PO TABS: 500.0000 mg | ORAL_TABLET | Freq: Every day | ORAL | 0 refills | Status: AC | PRN

## 2017-05-27 MED ORDER — ASPIRIN 325 MG PO TBEC: 325.0000 mg | DELAYED_RELEASE_TABLET | Freq: Every day | ORAL | 0 refills | Status: DC

## 2017-05-27 MED ORDER — METOPROLOL TARTRATE 50 MG PO TABS
50.0000 mg | ORAL_TABLET | Freq: Two times a day (BID) | ORAL | Status: DC
  Administered 2017-05-27: 50 mg via ORAL
  Filled 2017-05-27 (×2): qty 1

## 2017-05-27 MED ORDER — LOSARTAN POTASSIUM 50 MG PO TABS: 50.0000 mg | ORAL_TABLET | Freq: Every day | ORAL | 1 refills | Status: DC

## 2017-05-27 MED ORDER — BUDESONIDE-FORMOTEROL FUMARATE 80-4.5 MCG/ACT IN AERO: 2.0000 | INHALATION_SPRAY | Freq: Two times a day (BID) | RESPIRATORY_TRACT | 12 refills | Status: DC

## 2017-05-27 MED ORDER — METOPROLOL TARTRATE 50 MG PO TABS: 50.0000 mg | ORAL_TABLET | Freq: Two times a day (BID) | ORAL | 1 refills | Status: DC

## 2017-05-27 MED ORDER — POTASSIUM CHLORIDE CRYS ER 20 MEQ PO TBCR: 20.0000 meq | EXTENDED_RELEASE_TABLET | Freq: Every day | ORAL | 0 refills | Status: DC

## 2017-05-27 MED ORDER — OXYCODONE HCL 5 MG PO TABS: ORAL_TABLET | ORAL | 0 refills | Status: DC

## 2017-05-27 MED ORDER — FUROSEMIDE 40 MG PO TABS: 40.0000 mg | ORAL_TABLET | Freq: Every day | ORAL | 0 refills | Status: DC

## 2017-05-27 NOTE — Progress Notes (Addendum)
      [address]       [place] [id number]             [phone]        5 Days Post-Op Procedure(s) (LRB): CORONARY ARTERY BYPASS GRAFTING (CABG) x 2 WITH ENDOSCOPIC HARVESTING OF RIGHT SAPHENOUS VEIN (N/A) TRANSESOPHAGEAL ECHOCARDIOGRAM (TEE) (N/A)  Subjective: Patient sitting in chair. She has no complaints this am. She wants to go home.  Objective: Vital signs in last 24 hours: Temp:  [98 F (36.7 C)-99.2 F (37.3 C)] 98.4 F (36.9 C) (04/24 0543) Pulse Rate:  [90-106] 102 (04/24 0543) Cardiac Rhythm: Sinus tachycardia (04/24 0700) Resp:  [18-22] 19 (04/24 0543) BP: (147-174)/(61-84) 161/74 (04/24 0543) SpO2:  [90 %-94 %] 91 % (04/24 0818) Weight:  [210 lb (95.3 kg)] 210 lb (95.3 kg) (04/24 0543)  Pre op weight 96.6 kg Current Weight  05/27/17 210 lb (95.3 kg)      Intake/Output from previous day: 04/23 0701 - 04/24 0700 In: 246 [P.O.:240; I.V.:6] Out: 1 [Urine:1]   Physical Exam:  Cardiovascular: Slightly tachycardic Pulmonary: Slightly diminished at bases Abdomen: Soft, non tender, bowel sounds present. Extremities: Mild bilateral lower extremity edema. Wounds: Clean and dry.  No erythema or signs of infection.  Lab Results: CBC: Recent Labs    05/25/17 0237 05/26/17 0459  WBC 9.2 8.6  HGB 9.9* 10.6*  HCT 30.4* 32.6*  PLT 145* 179   BMET:  Recent Labs    05/25/17 0237 05/26/17 0459  NA 136 140  K 3.5 3.5  CL 104 107  CO2 23 23  GLUCOSE 95 93  BUN 14 11  CREATININE 0.91 1.00  CALCIUM 8.2* 8.3*    PT/INR:  Lab Results  Component Value Date   INR 1.31 05/22/2017   INR 1.08 05/21/2017   INR 1.03 05/18/2017   ABG:  INR: Will add last result for INR, ABG once components are confirmed Will add last 4 CBG results once components are confirmed  Assessment/Plan:  1. CV - Had previous short run of a fib. Slightly tachycardic (and has been) in the low 100's and hypertensive.On Lopressor 25 mg bid, Losartan 50 mg  daily, and Amlodipine 10 mg daily. Will increase Lopressor to 50 mg bid. 2.  Pulmonary - On room air. Encourage incentive spirometer 3. Volume Overload - On Lasix 40 mg daily and will continue at discharge for 5-7 days 4.  Acute blood loss anemia - Last H and H stable at 10.6 and 32.6 5. Remove sutures 6. Discharge later today  Lelon HuhDonielle M Henderson HospitalZimmermanPA-C 05/27/2017,8:33 AM  Plan home today I have seen and examined Audrey Wolf and agree with the above assessment  and plan.  Delight OvensEdward B Annalise Mcdiarmid MD Beeper [phone] Office [phone] 05/27/2017 1:31 PM

## 2017-05-27 NOTE — Progress Notes (Signed)
Patient is ready for discharge. Patient IV has been removed catheter intact, she has been DC'd from telemetry. Patient is alert and oriented and will be transported home by her son, Brayton CavesJessie. Patient is leaving unit by wheelchair and will meet son at the front entrance of the hospital.

## 2017-05-27 NOTE — Care Management Note (Addendum)
Case Management Note Donn PieriniKristi Eliese Kerwood RN, BSN Unit 4E-Case Manager [phone]  Patient Details  Name: Audrey BimlerKatherine R Amrhein MRN: [id number] Date of Birth: 1950-02-24  Subjective/Objective:   [facility]- consult pending                Action/Plan: PTA pt lived at home alone, PCP-Eric BrunsvilleSonnenberg, CM to follow for transition of care needs   Expected Discharge Date:  05/27/17               Expected Discharge Plan:  Home/Self Care  In-House Referral:  NA  Discharge planning Services  CM Consult  Post Acute Care Choice:  NA Choice offered to:  NA  DME Arranged:    DME Agency:     HH Arranged:    HH Agency:     Status of Service:  Completed, signed off  If discussed at Long Length of Stay Meetings, dates discussed:  4/23  Discharge Disposition: home/self care   Additional Comments:  05/27/17- 1200- Donn PieriniKristi Farhana Fellows RN, CM- pt for d/c home today with family suppport- no CM needs noted for transition to home  Darrold SpanWebster, Anali Cabanilla Hall, RN 05/27/2017, 12:07 PM

## 2017-05-27 NOTE — Progress Notes (Signed)
Cardiac Rehab [id number] Completed discharge education with pt. We discussed sternal precautions, risk factors, modifications, exercise guidelines, heart healthy diet and Outpt. CRP. She voices understanding. [facility] CRP. I placed going home after heart surgery video on for pt to watch.

## 2017-05-27 NOTE — Progress Notes (Signed)
Removed chest tube sutures per provider order. Patient tolerated well. Steri Strips applied to area. Area is clean dry and intact.

## 2017-05-30 NOTE — Progress Notes (Signed)
Patient ID: Audrey Wolf, female    DOB: 06-Aug-1950, 67 y.o.   MRN: [id number]  HPI  Audrey Wolf is a 67 y/o female with a history of asthma, CAD, HTN, CKD, NSTEMI (s/p CABG), GERD and chronic heart failure.  Echo report from 05/22/17 reviewed and showed an EF of 50-55% without any regurgitation. Cardiac catheterization done 05/18/17 revealed 70% stenosis Ostial LM with recommendation of CABG.   Admitted 05/19/17 due to needing CABG. Was given IV hydration prior to surgery. 2 vessel bypass performed on 05/22/17. Discharged after 8 days. Admitted 05/16/17 due to NSTEMI. Cardiology consult obtained and catheterization done. Troponins were elevated and heparin drip was started. Needed IV lasix due to acute on chronic HF and then transitioned to oral diuretics. Was transferred to [facility] after 3 days for CABG.   She presents today for her initial visit with a chief complaint of minimal shortness of breath upon moderate exertion. She says that this has been present for several weeks but is slowly improving. She has associated difficulty sleeping (since her CABG), cough and fatigue. She denies any weight gain, abdominal distention, palpitations, edema, chest pain or dizziness. Does endorse some discomfort along the sternal incisional line.  Past Medical History:  Diagnosis Date  . Arthritis   . Asthma   . CHF (congestive heart failure) (HCC)   . Chronic kidney disease   . Coronary artery disease   . GERD (gastroesophageal reflux disease)   . Headache   . Hypertension   . Nephrolithiasis   . NSTEMI (non-ST elevated myocardial infarction) [facility])    Past Surgical History:  Procedure Laterality Date  . ABDOMINAL HYSTERECTOMY    . APPENDECTOMY    . BREAST BIOPSY Left 12/31/2010   neg  . CORONARY ARTERY BYPASS GRAFT N/A 05/22/2017   Procedure: CORONARY ARTERY BYPASS GRAFTING (CABG) x 2 WITH ENDOSCOPIC HARVESTING OF RIGHT SAPHENOUS VEIN;  Surgeon: Delight Ovens, MD;  Location: [facility] OR;   Service: Open Heart Surgery;  Laterality: N/A;  . LEFT HEART CATH AND CORONARY ANGIOGRAPHY N/A 05/18/2017   Procedure: LEFT HEART CATH AND CORONARY ANGIOGRAPHY;  Surgeon: Laurier Nancy, MD;  Location: [facility] INVASIVE CV LAB;  Service: Cardiovascular;  Laterality: N/A;  . LITHOTRIPSY    . TEE WITHOUT CARDIOVERSION N/A 05/22/2017   Procedure: TRANSESOPHAGEAL ECHOCARDIOGRAM (TEE);  Surgeon: Delight Ovens, MD;  Location: [facility] OR;  Service: Open Heart Surgery;  Laterality: N/A;  . TONSILLECTOMY     Family History  Problem Relation Age of Onset  . Arthritis Mother   . Arthritis Father   . Heart disease Father   . Stroke Father   . Sudden Cardiac Death Father   . Breast cancer Maternal Aunt    Social History   Tobacco Use  . Smoking status: Never Smoker  . Smokeless tobacco: Never Used  Substance Use Topics  . Alcohol use: No   Allergies  Allergen Reactions  . Lisinopril Cough  . Ace Inhibitors Other (See Comments)    Has been told to avoid these and any diuretics because "left kidney has shrunk up and doesn't work"   Prior to Admission medications   Medication Sig Start Date End Date Taking? Authorizing Provider  acetaminophen (TYLENOL) 500 MG tablet Take 1 tablet (500 mg total) by mouth daily as needed for mild pain or headache. 05/27/17  Yes Doree Fudge M, PA-C  albuterol (PROVENTIL HFA;VENTOLIN HFA) 108 (90 Base) MCG/ACT inhaler Inhale 2 puffs into the lungs every 6 (six)  hours as needed for wheezing or shortness of breath. 04/20/17  Yes Glori Luis, MD  amLODipine (NORVASC) 10 MG tablet Take 1 tablet (10 mg total) by mouth daily. 06/01/17  Yes Clarisa Kindred A, FNP  aspirin EC 325 MG EC tablet Take 1 tablet (325 mg total) by mouth daily. 05/27/17  Yes Doree Fudge M, PA-C  furosemide (LASIX) 40 MG tablet Take 1 tablet (40 mg total) by mouth daily. For 5 days then stop. Patient taking differently: Take 40 mg by mouth daily. For 5 days then stop. 05/27/17  Yes  Doree Fudge M, PA-C  loratadine (CLARITIN) 10 MG tablet Take 10 mg by mouth daily.    Yes [provider]  losartan (COZAAR) 50 MG tablet Take 1 tablet (50 mg total) by mouth daily. 05/27/17  Yes Doree Fudge M, PA-C  metoprolol tartrate (LOPRESSOR) 50 MG tablet Take 1 tablet (50 mg total) by mouth 2 (two) times daily. 05/27/17  Yes Doree Fudge M, PA-C  oxyCODONE (OXY IR/ROXICODONE) 5 MG immediate release tablet Take 5 mg by mouth every 4-6 hours PRN severe pain 05/27/17  Yes Doree Fudge M, PA-C  potassium chloride SA (K-DUR,KLOR-CON) 20 MEQ tablet Take 1 tablet (20 mEq total) by mouth daily with breakfast. For 5 days then stop Patient taking differently: Take 20 mEq by mouth daily with breakfast. For 5 days then stop 05/28/17  Yes Doree Fudge M, PA-C  RaNITidine HCl (ZANTAC PO) Take 150 mg by mouth daily.    Yes [provider]  rosuvastatin (CRESTOR) 20 MG tablet Take 1 tablet (20 mg total) by mouth daily. 06/06/16  Yes Tommie Sams, DO    Review of Systems  Constitutional: Positive for fatigue.  HENT: Negative for congestion, postnasal drip and sore throat.   Eyes: Negative.   Respiratory: Positive for cough (first thing in the morning) and shortness of breath (improving). Negative for chest tightness.   Cardiovascular: Negative for chest pain, palpitations and leg swelling.  Gastrointestinal: Negative for abdominal distention and abdominal pain.  Endocrine: Negative.   Genitourinary: Negative.   Musculoskeletal: Negative for back pain and neck pain.  Skin:       Healing CABG incision  Allergic/Immunologic: Negative.   Neurological: Negative for dizziness and light-headedness.  Hematological: Negative for adenopathy. Does not bruise/bleed easily.  Psychiatric/Behavioral: Positive for sleep disturbance (sleeping in recliner some since CABG due to pain).    Vitals:   06/01/17 1056  BP: 111/61  Pulse: 94  Resp: 18  SpO2: 100%   Weight: 208 lb 8 oz (94.6 kg)  Height:  (1.753 m)   Wt Readings from Last 3 Encounters:  06/01/17 208 lb 8 oz (94.6 kg)  05/27/17 210 lb (95.3 kg)  05/19/17 221 lb 4.8 oz (100.4 kg)   Lab Results  Component Value Date   CREATININE 1.00 05/26/2017   CREATININE 0.91 05/25/2017   CREATININE 1.04 (H) 05/24/2017    Physical Exam  Constitutional: She is oriented to person, place, and time. She appears well-developed and well-nourished.  HENT:  Head: Normocephalic and atraumatic.  Neck: Normal range of motion. Neck supple. No JVD present.  Cardiovascular: Normal rate and regular rhythm.  Pulmonary/Chest: Effort normal. No respiratory distress. She has no wheezes. She has no rales.  Abdominal: Soft. She exhibits no distension.  Musculoskeletal: She exhibits no edema or tenderness.  Neurological: She is alert and oriented to person, place, and time.  Skin: Skin is warm and dry.  Healing sternal incision/ no  signs of infection  Psychiatric: She has a normal mood and affect. Her behavior is normal.  Nursing note and vitals reviewed.   Assessment & Plan:  1: Chronic heart failure with preserved ejection fraction- - NYHA class II - euvolemic today - weighing daily and says that she's lost weight. Discussed the importance of calling for an overnight weight gain of >2 pounds or a weekly weight gain of >5 pounds - weight down 9 pounds from 05/07/17 - not adding salt and says that her food now tastes "terrible" and that's why she's lost weight. She is using Mrs. Dash for seasoning and trying to adapt to how food now tastes. Reviewed the importance of closely following a  sodium diet and written dietary information was given to her about this. - sees cardiology Welton Flakes) on 06/05/17 - BNP on 05/16/17 was 461.0 - Pharm D reconciled medications with the patient  2: HTN- - BP looks good today - saw PCP (Kordsmeier) 05/07/17 & will see Dr. Birdie Sons 07/27/17 - BMP on 05/26/17 reviewed and  showed sodium 140, potassium 3.5 and GFR 57  3: NSTEMI- - recent CABG done 05/22/17  - sees cardiac surgeon 06/22/17 - discussed cardiac rehab in the future and brochure was provided.  Medication list was reviewed.  Return in 1 month or sooner for any questions/problems before then.

## 2017-06-01 ENCOUNTER — Ambulatory Visit: Payer: BLUE CROSS/BLUE SHIELD | Attending: Family | Admitting: Family

## 2017-06-01 ENCOUNTER — Encounter: Payer: Self-pay | Admitting: Family

## 2017-06-01 VITALS — BP 111/61 | HR 94 | Resp 18 | Ht 69.0 in | Wt 208.5 lb

## 2017-06-01 DIAGNOSIS — Z8261 Family history of arthritis: Secondary | ICD-10-CM | POA: Insufficient documentation

## 2017-06-01 DIAGNOSIS — Z87442 Personal history of urinary calculi: Secondary | ICD-10-CM | POA: Diagnosis not present

## 2017-06-01 DIAGNOSIS — I5032 Chronic diastolic (congestive) heart failure: Secondary | ICD-10-CM | POA: Insufficient documentation

## 2017-06-01 DIAGNOSIS — I214 Non-ST elevation (NSTEMI) myocardial infarction: Secondary | ICD-10-CM | POA: Insufficient documentation

## 2017-06-01 DIAGNOSIS — M1990 Unspecified osteoarthritis, unspecified site: Secondary | ICD-10-CM | POA: Insufficient documentation

## 2017-06-01 DIAGNOSIS — I509 Heart failure, unspecified: Secondary | ICD-10-CM | POA: Diagnosis present

## 2017-06-01 DIAGNOSIS — K219 Gastro-esophageal reflux disease without esophagitis: Secondary | ICD-10-CM | POA: Diagnosis not present

## 2017-06-01 DIAGNOSIS — Z8249 Family history of ischemic heart disease and other diseases of the circulatory system: Secondary | ICD-10-CM | POA: Insufficient documentation

## 2017-06-01 DIAGNOSIS — J45909 Unspecified asthma, uncomplicated: Secondary | ICD-10-CM | POA: Insufficient documentation

## 2017-06-01 DIAGNOSIS — Z90710 Acquired absence of both cervix and uterus: Secondary | ICD-10-CM | POA: Diagnosis not present

## 2017-06-01 DIAGNOSIS — I2510 Atherosclerotic heart disease of native coronary artery without angina pectoris: Secondary | ICD-10-CM | POA: Diagnosis not present

## 2017-06-01 DIAGNOSIS — Z888 Allergy status to other drugs, medicaments and biological substances status: Secondary | ICD-10-CM | POA: Diagnosis not present

## 2017-06-01 DIAGNOSIS — Z79899 Other long term (current) drug therapy: Secondary | ICD-10-CM | POA: Diagnosis not present

## 2017-06-01 DIAGNOSIS — I130 Hypertensive heart and chronic kidney disease with heart failure and stage 1 through stage 4 chronic kidney disease, or unspecified chronic kidney disease: Secondary | ICD-10-CM | POA: Diagnosis not present

## 2017-06-01 DIAGNOSIS — N189 Chronic kidney disease, unspecified: Secondary | ICD-10-CM | POA: Diagnosis not present

## 2017-06-01 DIAGNOSIS — Z951 Presence of aortocoronary bypass graft: Secondary | ICD-10-CM | POA: Insufficient documentation

## 2017-06-01 DIAGNOSIS — I10 Essential (primary) hypertension: Secondary | ICD-10-CM

## 2017-06-01 DIAGNOSIS — Z823 Family history of stroke: Secondary | ICD-10-CM | POA: Insufficient documentation

## 2017-06-01 MED ORDER — AMLODIPINE BESYLATE 10 MG PO TABS: 10.0000 mg | ORAL_TABLET | Freq: Every day | ORAL | 3 refills | Status: DC

## 2017-06-01 NOTE — Patient Instructions (Signed)
Continue weighing daily and call for an overnight weight gain of > 2 pounds or a weekly weight gain of >5 pounds. 

## 2017-06-05 DIAGNOSIS — R079 Chest pain, unspecified: Secondary | ICD-10-CM | POA: Diagnosis not present

## 2017-06-05 DIAGNOSIS — Z955 Presence of coronary angioplasty implant and graft: Secondary | ICD-10-CM | POA: Diagnosis not present

## 2017-06-05 DIAGNOSIS — Z951 Presence of aortocoronary bypass graft: Secondary | ICD-10-CM | POA: Diagnosis not present

## 2017-06-05 DIAGNOSIS — I214 Non-ST elevation (NSTEMI) myocardial infarction: Secondary | ICD-10-CM | POA: Diagnosis not present

## 2017-06-05 DIAGNOSIS — I2510 Atherosclerotic heart disease of native coronary artery without angina pectoris: Secondary | ICD-10-CM | POA: Diagnosis not present

## 2017-06-19 ENCOUNTER — Other Ambulatory Visit: Payer: Self-pay | Admitting: Cardiothoracic Surgery

## 2017-06-19 DIAGNOSIS — Z951 Presence of aortocoronary bypass graft: Secondary | ICD-10-CM

## 2017-06-22 ENCOUNTER — Other Ambulatory Visit: Payer: Self-pay

## 2017-06-22 ENCOUNTER — Ambulatory Visit (INDEPENDENT_AMBULATORY_CARE_PROVIDER_SITE_OTHER): Payer: Self-pay | Admitting: Physician Assistant

## 2017-06-22 ENCOUNTER — Ambulatory Visit
Admission: RE | Admit: 2017-06-22 | Discharge: 2017-06-22 | Disposition: A | Discharge status: Home / Self Care | Payer: BLUE CROSS/BLUE SHIELD | Source: Ambulatory Visit | Attending: Cardiothoracic Surgery | Admitting: Cardiothoracic Surgery

## 2017-06-22 VITALS — BP 109/65 | HR 86 | Resp 16 | Ht 69.0 in | Wt 208.0 lb

## 2017-06-22 DIAGNOSIS — Z951 Presence of aortocoronary bypass graft: Secondary | ICD-10-CM

## 2017-06-22 DIAGNOSIS — I2510 Atherosclerotic heart disease of native coronary artery without angina pectoris: Secondary | ICD-10-CM

## 2017-06-22 MED ORDER — FUROSEMIDE 40 MG PO TABS: 40.0000 mg | ORAL_TABLET | Freq: Every day | ORAL | 0 refills | Status: DC

## 2017-06-22 MED ORDER — POTASSIUM CHLORIDE ER 20 MEQ PO TBCR: 10.0000 meq | EXTENDED_RELEASE_TABLET | Freq: Every day | ORAL | 0 refills | Status: DC

## 2017-06-22 NOTE — Progress Notes (Signed)
HPI:  Patient returns for routine postoperative follow-up having undergone CABG x 2 on 05/15/2017.  The patient's early postoperative recovery while in the hospital was unremarkable. Since hospital discharge the patient reports she is doing fine.  She states that she is ambulating without issue up to 15 min 3 times per day.  She states her incisions are healing without evidence of infection.  Her weight has been stable and she denies swelling.  Her biggest concern is that she has been without pay due to her disability paper work not being completed.     Current Outpatient Medications  Medication Sig Dispense Refill  . acetaminophen (TYLENOL) 500 MG tablet Take 1 tablet (500 mg total) by mouth daily as needed for mild pain or headache. 30 tablet 0  . albuterol (PROVENTIL HFA;VENTOLIN HFA) 108 (90 Base) MCG/ACT inhaler Inhale 2 puffs into the lungs every 6 (six) hours as needed for wheezing or shortness of breath. 1 Inhaler 0  . amLODipine (NORVASC) 10 MG tablet Take 1 tablet (10 mg total) by mouth daily. 90 tablet 3  . aspirin EC 325 MG EC tablet Take 1 tablet (325 mg total) by mouth daily. 30 tablet 0  . loratadine (CLARITIN) 10 MG tablet Take 10 mg by mouth daily.     Marland Kitchen losartan (COZAAR) 50 MG tablet Take 1 tablet (50 mg total) by mouth daily. 30 tablet 1  . metoprolol tartrate (LOPRESSOR) 50 MG tablet Take 1 tablet (50 mg total) by mouth 2 (two) times daily. 60 tablet 1  . RaNITidine HCl (ZANTAC PO) Take 150 mg by mouth daily.     . rosuvastatin (CRESTOR) 20 MG tablet Take 1 tablet (20 mg total) by mouth daily. 90 tablet 3  . furosemide (LASIX) 40 MG tablet Take 1 tablet (40 mg total) by mouth daily. 7 tablet 0  . potassium chloride 20 MEQ TBCR Take 10 mEq by mouth daily. 7 tablet 0   No current facility-administered medications for this visit.     Physical Exam:  BP 109/65 (BP Location: Right Arm, Patient Position: Sitting, Cuff Size: Large)   Pulse 86   Resp 16   Ht  (1.753 m)    Wt 208 lb (94.3 kg)   SpO2 99% Comment: ON RA  BMI 30.72 kg/m   Gen: no apparent distress, she looks great Heart: RRR Lungs: CTA bilaterally Ext: no edema present Incision; well healed  Diagnostic Tests:  CXR: improvement of right pleural effusion, minimal left pleural effusion  A/P:  1. S/P CABG x 2 doing very well overall, she has seen Dr. Welton Flakes in follow up.. She is ready to start cardiac rehab which I placed referral to 2. Left pleural effusion- small patient is asymptomatic, will give a week of lasix, repeat CXR in a few weeks 3. Activity- increase as tolerated, she may resume driving, needs to continue weight restrictions for another 2- 3 weeks, then this can increase as she allows 4. In regards to STD, after review of her emails from STD company.  The patient did not fully understand the process of how to obtain STD approval.  She has been unable to print off the forms or access the site to print out the forms.  I helped the patient contact the STD company and requested the forms to be faxed to our office.  Once received we will complete and return necessary paperwork.  5. RTC in 2 months with EBG with CXR   [name], PA-C [facility] [phone]

## 2017-06-26 DIAGNOSIS — I2510 Atherosclerotic heart disease of native coronary artery without angina pectoris: Secondary | ICD-10-CM | POA: Diagnosis not present

## 2017-06-26 DIAGNOSIS — I10 Essential (primary) hypertension: Secondary | ICD-10-CM | POA: Diagnosis not present

## 2017-06-26 DIAGNOSIS — I25810 Atherosclerosis of coronary artery bypass graft(s) without angina pectoris: Secondary | ICD-10-CM | POA: Diagnosis not present

## 2017-06-26 DIAGNOSIS — E782 Mixed hyperlipidemia: Secondary | ICD-10-CM | POA: Diagnosis not present

## 2017-07-06 ENCOUNTER — Encounter: Payer: Self-pay | Admitting: Family

## 2017-07-06 ENCOUNTER — Ambulatory Visit: Payer: BLUE CROSS/BLUE SHIELD | Attending: Family | Admitting: Family

## 2017-07-06 VITALS — BP 120/62 | HR 66 | Resp 18 | Ht 69.0 in | Wt 209.2 lb

## 2017-07-06 DIAGNOSIS — I25810 Atherosclerosis of coronary artery bypass graft(s) without angina pectoris: Secondary | ICD-10-CM | POA: Diagnosis not present

## 2017-07-06 DIAGNOSIS — I130 Hypertensive heart and chronic kidney disease with heart failure and stage 1 through stage 4 chronic kidney disease, or unspecified chronic kidney disease: Secondary | ICD-10-CM | POA: Diagnosis not present

## 2017-07-06 DIAGNOSIS — N189 Chronic kidney disease, unspecified: Secondary | ICD-10-CM | POA: Insufficient documentation

## 2017-07-06 DIAGNOSIS — Z7982 Long term (current) use of aspirin: Secondary | ICD-10-CM | POA: Insufficient documentation

## 2017-07-06 DIAGNOSIS — I252 Old myocardial infarction: Secondary | ICD-10-CM | POA: Insufficient documentation

## 2017-07-06 DIAGNOSIS — I10 Essential (primary) hypertension: Secondary | ICD-10-CM | POA: Diagnosis not present

## 2017-07-06 DIAGNOSIS — Z951 Presence of aortocoronary bypass graft: Secondary | ICD-10-CM | POA: Diagnosis not present

## 2017-07-06 DIAGNOSIS — I2510 Atherosclerotic heart disease of native coronary artery without angina pectoris: Secondary | ICD-10-CM | POA: Diagnosis not present

## 2017-07-06 DIAGNOSIS — I214 Non-ST elevation (NSTEMI) myocardial infarction: Secondary | ICD-10-CM

## 2017-07-06 DIAGNOSIS — J45909 Unspecified asthma, uncomplicated: Secondary | ICD-10-CM | POA: Insufficient documentation

## 2017-07-06 DIAGNOSIS — Z955 Presence of coronary angioplasty implant and graft: Secondary | ICD-10-CM | POA: Diagnosis not present

## 2017-07-06 DIAGNOSIS — Z87442 Personal history of urinary calculi: Secondary | ICD-10-CM | POA: Diagnosis not present

## 2017-07-06 DIAGNOSIS — Z79899 Other long term (current) drug therapy: Secondary | ICD-10-CM | POA: Insufficient documentation

## 2017-07-06 DIAGNOSIS — I5032 Chronic diastolic (congestive) heart failure: Secondary | ICD-10-CM

## 2017-07-06 DIAGNOSIS — I509 Heart failure, unspecified: Secondary | ICD-10-CM | POA: Diagnosis not present

## 2017-07-06 DIAGNOSIS — K219 Gastro-esophageal reflux disease without esophagitis: Secondary | ICD-10-CM | POA: Diagnosis not present

## 2017-07-06 DIAGNOSIS — E782 Mixed hyperlipidemia: Secondary | ICD-10-CM | POA: Diagnosis not present

## 2017-07-06 DIAGNOSIS — M1990 Unspecified osteoarthritis, unspecified site: Secondary | ICD-10-CM | POA: Diagnosis not present

## 2017-07-06 MED ORDER — METOPROLOL TARTRATE 50 MG PO TABS: 50.0000 mg | ORAL_TABLET | Freq: Two times a day (BID) | ORAL | 3 refills | Status: DC

## 2017-07-06 NOTE — Progress Notes (Signed)
Patient ID: Alvy BimlerKatherine R Wolf, female    DOB: December 03, 1950, 67 y.o.   MRN: [id number]  HPI  Audrey Wolf is a 67 y/o female with a history of asthma, CAD, HTN, CKD, NSTEMI (s/p CABG), GERD and chronic heart failure.  Echo report from 05/22/17 reviewed and showed an EF of 50-55% without any regurgitation. Cardiac catheterization done 05/18/17 revealed 70% stenosis Ostial LM with recommendation of CABG.   Admitted 05/19/17 due to needing CABG. Was given IV hydration prior to surgery. 2 vessel bypass performed on 05/22/17. Discharged after 8 days. Admitted 05/16/17 due to NSTEMI. Cardiology consult obtained and catheterization done. Troponins were elevated and heparin drip was started. Needed IV lasix due to acute on chronic HF and then transitioned to oral diuretics. [facility] for [facility].   She presents today for a follow-up visit with a chief complaint of minimal shortness of breath upon moderate exertion. She describes this as chronic in nature having been present for several months. She does feel like her breathing continues to improve. She has associated fatigue and difficulty sleeping along with this. She denies any abdominal distention, palpitations, edema, chest pain, cough, dizziness or weight gain. Does describes some chest soreness after having an echocardiogram performed earlier today.   Past Medical History:  Diagnosis Date  . Arthritis   . Asthma   . CHF (congestive heart failure) (HCC)   . Chronic kidney disease   . Coronary artery disease   . GERD (gastroesophageal reflux disease)   . Headache   . Hypertension   . Nephrolithiasis   . NSTEMI (non-ST elevated myocardial infarction) [facility](HCC)    Past Surgical History:  Procedure Laterality Date  . ABDOMINAL HYSTERECTOMY    . APPENDECTOMY    . BREAST BIOPSY Left 12/31/2010   neg  . CORONARY ARTERY BYPASS GRAFT N/A 05/22/2017   Procedure: CORONARY ARTERY BYPASS GRAFTING (CABG) x 2 WITH ENDOSCOPIC HARVESTING  OF RIGHT SAPHENOUS VEIN;  Surgeon: Delight OvensGerhardt, Edward B, MD;  [facility];  Service: Open Heart Surgery;  Laterality: N/A;  . LEFT HEART CATH AND CORONARY ANGIOGRAPHY N/A 05/18/2017   Procedure: LEFT HEART CATH AND CORONARY ANGIOGRAPHY;  Surgeon: Laurier NancyKhan, Shaukat A, MD;  Location: [facility] INVASIVE CV LAB;  Service: Cardiovascular;  Laterality: N/A;  . LITHOTRIPSY    . TEE WITHOUT CARDIOVERSION N/A 05/22/2017   Procedure: TRANSESOPHAGEAL ECHOCARDIOGRAM (TEE);  Surgeon: Delight OvensGerhardt, Edward B, MD;  [facility];  Service: Open Heart Surgery;  Laterality: N/A;  . TONSILLECTOMY     Family History  Problem Relation Age of Onset  . Arthritis Mother   . Arthritis Father   . Heart disease Father   . Stroke Father   . Sudden Cardiac Death Father   . Breast cancer Maternal Aunt    Social History   Tobacco Use  . Smoking status: Never Smoker  . Smokeless tobacco: Never Used  Substance Use Topics  . Alcohol use: No   Allergies  Allergen Reactions  . Lisinopril Cough  . Ace Inhibitors Other (See Comments)    Has been told to avoid these and any diuretics because "left kidney has shrunk up and doesn't work"   Prior to Admission medications   Medication Sig Start Date End Date Taking? Authorizing Provider  acetaminophen (TYLENOL) 500 MG tablet Take 1 tablet (500 mg total) by mouth daily as needed for mild pain or headache. 05/27/17  Yes Doree FudgeZimmerman, Donielle M, PA-C  albuterol (PROVENTIL HFA;VENTOLIN HFA) 108 (90  Base) MCG/ACT inhaler Inhale 2 puffs into the lungs every 6 (six) hours as needed for wheezing or shortness of breath. 04/20/17  Yes Glori Luis, MD  amLODipine (NORVASC) 10 MG tablet Take 1 tablet (10 mg total) by mouth daily. 06/01/17  Yes Clarisa Kindred A, FNP  aspirin EC 325 MG EC tablet Take 1 tablet (325 mg total) by mouth daily. 05/27/17  Yes Doree Fudge M, PA-C  loratadine (CLARITIN) 10 MG tablet Take 10 mg by mouth daily.    Yes [provider]  losartan (COZAAR) 50  MG tablet Take 1 tablet (50 mg total) by mouth daily. 05/27/17  Yes Doree Fudge M, PA-C  metoprolol tartrate (LOPRESSOR) 50 MG tablet Take 1 tablet (50 mg total) by mouth 2 (two) times daily. 07/06/17  Yes Clarisa Kindred A, FNP  RaNITidine HCl (ZANTAC PO) Take 150 mg by mouth daily.    Yes [provider]  rosuvastatin (CRESTOR) 20 MG tablet Take 1 tablet (20 mg total) by mouth daily. 06/06/16  Yes Cook, Jayce G, DO  furosemide (LASIX) 40 MG tablet Take 1 tablet (40 mg total) by mouth daily. Patient not taking: Reported on 07/06/2017 06/22/17   Barrett, Denny Peon R, PA-C  potassium chloride 20 MEQ TBCR Take 10 mEq by mouth daily. Patient not taking: Reported on 07/06/2017 06/22/17   Barrett, Rae Roam, PA-C    Review of Systems  Constitutional: Positive for fatigue.  HENT: Negative for congestion, postnasal drip and sore throat.   Eyes: Negative.   Respiratory: Positive for shortness of breath (improving). Negative for cough and chest tightness.   Cardiovascular: Negative for chest pain, palpitations and leg swelling.  Gastrointestinal: Negative for abdominal distention and abdominal pain.  Endocrine: Negative.   Genitourinary: Negative.   Musculoskeletal: Negative for back pain and neck pain.  Skin:       Healing CABG incision  Allergic/Immunologic: Negative.   Neurological: Negative for dizziness and light-headedness.  Hematological: Negative for adenopathy. Does not bruise/bleed easily.  Psychiatric/Behavioral: Positive for sleep disturbance (sleeping in recliner some since CABG due to pain).   Vitals:   07/06/17 1132  BP: 120/62  Pulse: 66  Resp: 18  SpO2: 100%  Weight: 209 lb 4 oz (94.9 kg)  Height: 5\' 9"  (1.753 m)   Wt Readings from Last 3 Encounters:  07/06/17 209 lb 4 oz (94.9 kg)  06/22/17 208 lb (94.3 kg)  06/01/17 208 lb 8 oz (94.6 kg)   Lab Results  Component Value Date   CREATININE 1.00 05/26/2017   CREATININE 0.91 05/25/2017   CREATININE 1.04 (H) 05/24/2017     Physical Exam  Constitutional: She is oriented to person, place, and time. She appears well-developed and well-nourished.  HENT:  Head: Normocephalic and atraumatic.  Neck: Normal range of motion. Neck supple. No JVD present.  Cardiovascular: Normal rate and regular rhythm.  Pulmonary/Chest: Effort normal. No respiratory distress. She has no wheezes. She has no rales.  Abdominal: Soft. She exhibits no distension.  Musculoskeletal: She exhibits no edema or tenderness.  Neurological: She is alert and oriented to person, place, and time.  Skin: Skin is warm and dry.  Healing sternal incision/ no signs of infection  Psychiatric: She has a normal mood and affect. Her behavior is normal.  Nursing note and vitals reviewed.   Assessment & Plan:  1: Chronic heart failure with preserved ejection fraction- - NYHA class II - euvolemic today - weighing daily. Discussed the importance of calling for an overnight weight gain  of >2 pounds or a weekly weight gain of >5 pounds - weight stable from last time she was here on 06/01/17 - not adding salt and trying to keep her daily sodium intake to 2000mg  daily - saw cardiology Welton Flakes) earlier today and had echocardiogram done - BNP on 05/16/17 was 461.0  2: HTN- - BP looks good today - saw PCP (Kordsmeier) 05/07/17 & will see Dr. Birdie Sons 07/27/17 - BMP on 05/26/17 reviewed and showed sodium 140, potassium 3.5 and GFR 57  3: NSTEMI- - recent CABG done 05/22/17  - saw cardiac surgeon 06/22/17 - cardiac rehab referral made by local cardiologist  Medication list was reviewed.   Return in 2 months or sooner for any questions/problems before then.

## 2017-07-06 NOTE — Patient Instructions (Signed)
Continue weighing daily and call for an overnight weight gain of > 2 pounds or a weekly weight gain of >5 pounds. 

## 2017-07-08 ENCOUNTER — Telehealth: Payer: Self-pay | Admitting: Family Medicine

## 2017-07-08 NOTE — Telephone Encounter (Signed)
In red folder. 

## 2017-07-08 NOTE — Telephone Encounter (Signed)
The patient is aware that the money order she filled out for [employer] is not valid for this office and Dr. Birdie SonsSonnenberg will fill out her FMLA paper work and she will be billed accordingly. The money order will be sent back to the patient via US mail.

## 2017-07-08 NOTE — Telephone Encounter (Signed)
FMLA paperwork came in Via interoffice mail. Please fax to [phone] Claim # [id number] Placed in Dr. Birdie SonsSonnenberg color folder upfront

## 2017-07-09 NOTE — Telephone Encounter (Signed)
Please find out what [facility]. Thanks.

## 2017-07-10 NOTE — Telephone Encounter (Signed)
Patient states she does not know how they ended up here, she states this was supposed to have been sent to Dr.Gerheardt for her bypass surgery. Informed patient I will fax forms to Dr.Gerhardt

## 2017-07-21 ENCOUNTER — Ambulatory Visit: Payer: BLUE CROSS/BLUE SHIELD | Admitting: Family Medicine

## 2017-07-27 ENCOUNTER — Encounter: Payer: Self-pay | Admitting: Family Medicine

## 2017-07-27 ENCOUNTER — Ambulatory Visit (INDEPENDENT_AMBULATORY_CARE_PROVIDER_SITE_OTHER): Payer: BLUE CROSS/BLUE SHIELD | Admitting: Family Medicine

## 2017-07-27 ENCOUNTER — Other Ambulatory Visit: Payer: Self-pay | Admitting: Physician Assistant

## 2017-07-27 VITALS — BP 130/80 | HR 78 | Temp 97.6°F | Ht 69.0 in | Wt 214.0 lb

## 2017-07-27 DIAGNOSIS — I5032 Chronic diastolic (congestive) heart failure: Secondary | ICD-10-CM | POA: Diagnosis not present

## 2017-07-27 DIAGNOSIS — I10 Essential (primary) hypertension: Secondary | ICD-10-CM

## 2017-07-27 DIAGNOSIS — E785 Hyperlipidemia, unspecified: Secondary | ICD-10-CM

## 2017-07-27 DIAGNOSIS — D649 Anemia, unspecified: Secondary | ICD-10-CM | POA: Insufficient documentation

## 2017-07-27 DIAGNOSIS — I2510 Atherosclerotic heart disease of native coronary artery without angina pectoris: Secondary | ICD-10-CM

## 2017-07-27 LAB — COMPREHENSIVE METABOLIC PANEL
ALBUMIN: 4 g/dL (ref 3.5–5.2)
ALT: 26 U/L (ref 0–35)
AST: 27 U/L (ref 0–37)
Alkaline Phosphatase: 90 U/L (ref 39–117)
BILIRUBIN TOTAL: 0.5 mg/dL (ref 0.2–1.2)
BUN: 24 mg/dL — AB (ref 6–23)
CALCIUM: 9.6 mg/dL (ref 8.4–10.5)
CHLORIDE: 108 meq/L (ref 96–112)
CO2: 24 mEq/L (ref 19–32)
CREATININE: 0.96 mg/dL (ref 0.40–1.20)
GFR: 61.51 mL/min (ref >60.00)
GLUCOSE: 106 mg/dL — AB (ref 70–99)
Potassium: 4 mEq/L (ref 3.5–5.1)
Sodium: 141 mEq/L (ref 135–145)
Total Protein: 6.8 g/dL (ref 6.0–8.3)

## 2017-07-27 LAB — CBC
HEMATOCRIT: 38.1 % (ref 36.0–46.0)
Hemoglobin: 12.7 g/dL (ref 12.0–15.0)
MCHC: 33.3 g/dL (ref 30.0–36.0)
MCV: 88.9 fl (ref 78.0–100.0)
Platelets: 236 10*3/uL (ref 150.0–400.0)
RBC: 4.28 Mil/uL (ref 3.87–5.11)
RDW: 14.2 % (ref 11.5–15.5)
WBC: 6.9 10*3/uL (ref 4.0–10.5)

## 2017-07-27 NOTE — Assessment & Plan Note (Signed)
Most recent LDL well controlled.  Continue Crestor.

## 2017-07-27 NOTE — Assessment & Plan Note (Signed)
Status post CABG.  She has been doing well.  She has not started cardiac rehab.  She will continue to see cardiology and her thoracic surgeon.

## 2017-07-27 NOTE — Assessment & Plan Note (Signed)
Has been a symptomatic.  She will continue to see cardiology.

## 2017-07-27 NOTE — Assessment & Plan Note (Addendum)
Anemic after surgery.  Appeared to be trending up after discharge.  We will recheck today.

## 2017-07-27 NOTE — Patient Instructions (Addendum)
Nice to see you. We will check some lab work today and contact you with the results. Please monitor your depression and if it worsens please let us know.  If you develop thoughts of harming yourself please seek medical attention in the emergency room. If you develop chest pain or shortness of breath or fluid accumulation please seek medical attention..  Patient previously hospitalized in

## 2017-07-27 NOTE — Progress Notes (Signed)
Pre-visit discussion using our clinic review tool. No additional management support is needed unless otherwise documented below in the visit note.  

## 2017-07-27 NOTE — Progress Notes (Signed)
  Tommi Rumps, MD Phone: [phone]  Audrey Wolf is a 67 y.o. female who presents today for f/u.  CC: htn, CAD, s/p cabg, chf  Patient was hospitalized in April after having an NSTEMI.  She was transferred to [facility] to undergo two-vessel CABG.  She has done well since discharge.  She notes no chest pain.  She notes her breathing is improving.  She has some mild chronic dyspnea.  No edema.  No orthopnea.  No PND.  She has been out of work for the last 3 months and feels somewhat depressed intermittently.  She goes back to work in July if she is cleared.  She notes no SI.  She does not want medication.  She does not want to see a therapist.  She continues on amlodipine, aspirin, losartan, metoprolol, and Crestor.  No right upper quadrant pain or myalgias.  She continues to see cardiology and her cardiothoracic surgeon.  Social History   Tobacco Use  Smoking Status Never Smoker  Smokeless Tobacco Never Used     ROS see history of present illness  Objective  Physical Exam Vitals:   07/27/17 0858  BP: 130/80  Pulse: 78  Temp: 97.6 F (36.4 C)  SpO2: 98%    BP Readings from Last 3 Encounters:  07/27/17 130/80  07/06/17 120/62  06/22/17 109/65   Wt Readings from Last 3 Encounters:  07/27/17 214 lb (97.1 kg)  07/06/17 209 lb 4 oz (94.9 kg)  06/22/17 208 lb (94.3 kg)    Physical Exam  Constitutional: No distress.  Cardiovascular: Normal rate, regular rhythm and normal heart sounds.  Midline sternotomy scar appears well-healing with no signs of infection  Pulmonary/Chest: Effort normal and breath sounds normal.  Musculoskeletal: She exhibits no edema.  Neurological: She is alert.  Skin: Skin is warm and dry. She is not diaphoretic.     Assessment/Plan: Please see individual problem list.  Hypertension Well-controlled.  Continue current regimen.  CAD (coronary artery disease), native coronary artery Status post CABG.  She has been doing well.  She has  not started cardiac rehab.  She will continue to see cardiology and her thoracic surgeon.  Hyperlipidemia Most recent LDL well controlled.  Continue Crestor.  Anemia Anemic after surgery.  Appeared to be trending up after discharge.  We will recheck today.  CHF (congestive heart failure) ([place]) Has been a symptomatic.  She will continue to see cardiology.   Health Maintenance: Patient defers mammogram at this time until more recovered from heart surgery.  Orders Placed This Encounter  Procedures  . Comp Met (CMET)  . CBC    No orders of the defined types were placed in this encounter.    Tommi Rumps, MD [place]

## 2017-07-27 NOTE — Assessment & Plan Note (Signed)
Well-controlled.  Continue current regimen. 

## 2017-08-25 ENCOUNTER — Other Ambulatory Visit: Payer: Self-pay | Admitting: Cardiothoracic Surgery

## 2017-08-25 DIAGNOSIS — Z951 Presence of aortocoronary bypass graft: Secondary | ICD-10-CM

## 2017-08-27 ENCOUNTER — Ambulatory Visit
Admission: RE | Admit: 2017-08-27 | Discharge: 2017-08-27 | Disposition: A | Discharge status: Home / Self Care | Payer: BLUE CROSS/BLUE SHIELD | Source: Ambulatory Visit | Attending: Cardiothoracic Surgery | Admitting: Cardiothoracic Surgery

## 2017-08-27 ENCOUNTER — Ambulatory Visit (INDEPENDENT_AMBULATORY_CARE_PROVIDER_SITE_OTHER): Payer: BLUE CROSS/BLUE SHIELD | Admitting: Cardiothoracic Surgery

## 2017-08-27 ENCOUNTER — Other Ambulatory Visit: Payer: Self-pay

## 2017-08-27 ENCOUNTER — Encounter: Payer: Self-pay | Admitting: Cardiothoracic Surgery

## 2017-08-27 VITALS — BP 140/66 | HR 70 | Resp 16 | Ht 69.0 in | Wt 215.0 lb

## 2017-08-27 DIAGNOSIS — Z951 Presence of aortocoronary bypass graft: Secondary | ICD-10-CM | POA: Diagnosis not present

## 2017-08-27 DIAGNOSIS — I2510 Atherosclerotic heart disease of native coronary artery without angina pectoris: Secondary | ICD-10-CM | POA: Diagnosis not present

## 2017-08-27 NOTE — Progress Notes (Signed)
[address]       [place], [id number]             [phone]                  Audrey Wolf [facility] Medical Record #[id number] Date of Birth: 1950/09/08  Referring OZ:HYQMD:Khan, Audrey PasseyShaukat A, MD Primary Cardiology: Primary Care:Audrey Wolf, Audrey G, MD  Chief Complaint:  Follow Up Visit  05/22/2017  PREOPERATIVE DIAGNOSIS:  Coronary occlusive disease with unstable angina.  POSTOPERATIVE DIAGNOSIS:  Coronary occlusive disease with unstable angina.  SURGICAL PROCEDURE:  Coronary artery bypass grafting x2 with the left internal mammary to the left anterior descending coronary artery, and reverse saphenous vein graft to the obtuse marginal coronary artery with right leg greater saphenous thigh  endovein harvesting.  SURGEON:  Sheliah PlaneEdward Celestia Duva, MD.    History of Present Illness:      Patient returns office in follow-up after coronary artery bypass grafting x2 May 22, 2017.  Patient is returned to near normal activities.  She denies any symptoms of congestive heart failure or recurrent angina.     Audrey Wolf Score: At the time of surgery this patient's most appropriate activity status/level should be described as: []     0    Normal activity, no symptoms [x]     1    Restricted in physical strenuous activity but ambulatory, able to do out light work []     2    Ambulatory and capable of self care, unable to do work activities, up and about                 >50 % of waking hours                                                                                   []     3    Only limited self care, in bed greater than 50% of waking hours []     4    Completely disabled, no self care, confined to bed or chair []     5    Moribund  Social History   Tobacco Use  Smoking Status Never Smoker  Smokeless Tobacco Never Used       Allergies  Allergen Reactions  . Lisinopril Cough  . Ace Inhibitors Other (See Comments)    Has been told to avoid these and any  diuretics because "left kidney has shrunk up and doesn't work"    Current Outpatient Medications  Medication Sig Dispense Refill  . acetaminophen (TYLENOL) 500 MG tablet Take 1 tablet (500 mg total) by mouth daily as needed for mild pain or headache. 30 tablet 0  . albuterol (PROVENTIL HFA;VENTOLIN HFA) 108 (90 Base) MCG/ACT inhaler Inhale 2 puffs into the lungs every 6 (six) hours as needed for wheezing or shortness of breath. 1 Inhaler 0  . amLODipine (NORVASC) 10 MG tablet Take 1 tablet (10 mg total) by mouth daily. 90 tablet 3  . aspirin EC 325 MG EC tablet Take 1 tablet (325 mg total) by mouth daily. 30 tablet 0  . loratadine (CLARITIN) 10 MG tablet Take 10 mg by mouth  daily.     . losartan (COZAAR) 50 MG tablet Take 1 tablet (50 mg total) by mouth daily. 30 tablet 1  . metoprolol tartrate (LOPRESSOR) 50 MG tablet Take 1 tablet (50 mg total) by mouth 2 (two) times daily. 180 tablet 3  . RaNITidine HCl (ZANTAC PO) Take 150 mg by mouth daily.     . rosuvastatin (CRESTOR) 20 MG tablet Take 1 tablet (20 mg total) by mouth daily. 90 tablet 3   No current facility-administered medications for this visit.        Physical Exam: BP 140/66 (BP Location: Left Arm, Patient Position: Sitting, Cuff Size: Large)   Pulse 70   Resp 16   Ht 5\' 9"  (1.753 m)   Wt 215 lb (97.5 kg)   SpO2 98% Comment: ON RA  BMI 31.75 kg/m   General appearance: alert and cooperative Neurologic: intact Heart: regular rate and rhythm, S1, S2 normal, no murmur, click, rub or gallop Lungs: clear to auscultation bilaterally Abdomen: soft, non-tender; bowel sounds normal; no masses,  no organomegaly Extremities: extremities normal, atraumatic, no cyanosis or edema and Homans sign is negative, no sign of DVT Wounds: Sternal incision is healing well, bone is intact  Diagnostic Studies & Laboratory data:         Recent Radiology Findings: Dg Chest 2 View  Result Date: 08/27/2017 CLINICAL DATA:  Status post CABG on  May 22, 2017. No current chest complaints. History of asthma and CHF and previous MI. EXAM: CHEST - 2 VIEW COMPARISON:  PA and lateral chest x-ray of Jun 22, 2017 Wolf FINDINGS: The lungs are well-expanded. There is Wolf stable calcified nodule peripherally in the left upper lobe. The heart and pulmonary vascularity are normal. There is calcification in the wall of the aortic arch. The sternal wires are intact. There is no pleural effusion. IMPRESSION: There is no active cardiopulmonary disease. Previous CABG and previous granulomatous infection. Electronically Signed   By: Audrey  Wolf M.D.   On: 08/27/2017 10:07      I have independently reviewed the above radiology findings and reviewed findings  with the patient.  Recent Labs: Lab Results  Component Value Date   WBC 6.9 07/27/2017   HGB 12.7 07/27/2017   HCT 38.1 07/27/2017   PLT 236.0 07/27/2017   GLUCOSE 106 (H) 07/27/2017   CHOL 99 05/17/2017   TRIG 72 05/17/2017   HDL 39 (L) 05/17/2017   LDLCALC 46 05/17/2017   ALT 26 07/27/2017   AST 27 07/27/2017   NA 141 07/27/2017   K 4.0 07/27/2017   CL 108 07/27/2017   CREATININE 0.96 07/27/2017   BUN 24 (H) 07/27/2017   CO2 24 07/27/2017   TSH 3.58 06/05/2016   INR 1.31 05/22/2017   HGBA1C 5.5 05/19/2017      Assessment / Plan:   Patient doing well following coronary artery bypass grafting without evidence of congestive heart failure or recurrent angina Plan to see back as necessary, to need to be followed by cardiology    Audrey Wolf 08/27/2017 10:41 AM

## 2017-09-07 DIAGNOSIS — I10 Essential (primary) hypertension: Secondary | ICD-10-CM | POA: Diagnosis not present

## 2017-09-07 DIAGNOSIS — I2510 Atherosclerotic heart disease of native coronary artery without angina pectoris: Secondary | ICD-10-CM | POA: Diagnosis not present

## 2017-09-07 DIAGNOSIS — R071 Chest pain on breathing: Secondary | ICD-10-CM | POA: Diagnosis not present

## 2017-09-07 DIAGNOSIS — R0602 Shortness of breath: Secondary | ICD-10-CM | POA: Diagnosis not present

## 2017-09-07 DIAGNOSIS — E782 Mixed hyperlipidemia: Secondary | ICD-10-CM | POA: Diagnosis not present

## 2017-09-07 DIAGNOSIS — I25810 Atherosclerosis of coronary artery bypass graft(s) without angina pectoris: Secondary | ICD-10-CM | POA: Diagnosis not present

## 2017-09-18 ENCOUNTER — Other Ambulatory Visit: Payer: Self-pay | Admitting: Family

## 2017-09-18 DIAGNOSIS — I10 Essential (primary) hypertension: Secondary | ICD-10-CM

## 2017-09-18 MED ORDER — LOSARTAN POTASSIUM 50 MG PO TABS: 50.0000 mg | ORAL_TABLET | Freq: Every day | ORAL | 3 refills | Status: DC

## 2017-09-18 MED ORDER — ROSUVASTATIN CALCIUM 20 MG PO TABS: 20.0000 mg | ORAL_TABLET | Freq: Every day | ORAL | 3 refills | Status: DC

## 2017-10-04 NOTE — Progress Notes (Signed)
Patient ID: Audrey Wolf, female    DOB: 1950-08-22, 67 y.o.   MRN: [id number]  HPI  Audrey Wolf is a 67 y/o female with a history of asthma, CAD, HTN, CKD, NSTEMI (s/p CABG), GERD and chronic heart failure.  Echo report from 05/22/17 reviewed and showed an EF of 50-55% without any regurgitation. Cardiac catheterization done 05/18/17 revealed 70% stenosis Ostial LM with recommendation of CABG.   Admitted 05/19/17 due to needing CABG. Was given IV hydration prior to surgery. 2 vessel bypass performed on 05/22/17. Discharged after 8 days. Admitted 05/16/17 due to NSTEMI. Cardiology consult obtained and catheterization done. Troponins were elevated and heparin drip was started. Needed IV lasix due to acute on chronic HF and then transitioned to oral diuretics. Was transferred to [facility] after 3 days for CABG.   She presents today for a follow-up visit with a chief complaint of minimal shortness of breath upon moderate exertion. She describes this as chronic in nature having been present for several years. She has associated fatigue, leg pain, difficulty sleeping and gradual weight gain along with this. She denies any abdominal distention, palpitations, pedal edema, chest pain, cough or dizziness.   Past Medical History:  Diagnosis Date  . Arthritis   . Asthma   . CHF (congestive heart failure) (HCC)   . Chronic kidney disease   . Coronary artery disease   . GERD (gastroesophageal reflux disease)   . Headache   . Hypertension   . Nephrolithiasis   . NSTEMI (non-ST elevated myocardial infarction) [facility])    Past Surgical History:  Procedure Laterality Date  . ABDOMINAL HYSTERECTOMY    . APPENDECTOMY    . BREAST BIOPSY Left 12/31/2010   neg  . CORONARY ARTERY BYPASS GRAFT N/A 05/22/2017   Procedure: CORONARY ARTERY BYPASS GRAFTING (CABG) x 2 WITH ENDOSCOPIC HARVESTING OF RIGHT SAPHENOUS VEIN;  Surgeon: Delight Ovens, MD;  Location: [facility] OR;  Service: Open Heart Surgery;  Laterality:  N/A;  . LEFT HEART CATH AND CORONARY ANGIOGRAPHY N/A 05/18/2017   Procedure: LEFT HEART CATH AND CORONARY ANGIOGRAPHY;  Surgeon: Laurier Nancy, MD;  Location: [facility] INVASIVE CV LAB;  Service: Cardiovascular;  Laterality: N/A;  . LITHOTRIPSY    . TEE WITHOUT CARDIOVERSION N/A 05/22/2017   Procedure: TRANSESOPHAGEAL ECHOCARDIOGRAM (TEE);  Surgeon: Delight Ovens, MD;  Location: [facility] OR;  Service: Open Heart Surgery;  Laterality: N/A;  . TONSILLECTOMY     Family History  Problem Relation Age of Onset  . Arthritis Mother   . Arthritis Father   . Heart disease Father   . Stroke Father   . Sudden Cardiac Death Father   . Breast cancer Maternal Aunt    Social History   Tobacco Use  . Smoking status: Never Smoker  . Smokeless tobacco: Never Used  Substance Use Topics  . Alcohol use: No   Allergies  Allergen Reactions  . Lisinopril Cough  . Ace Inhibitors Other (See Comments)    Has been told to avoid these and any diuretics because "left kidney has shrunk up and doesn't work"   Prior to Admission medications   Medication Sig Start Date End Date Taking? Authorizing Provider  acetaminophen (TYLENOL) 500 MG tablet Take 1 tablet (500 mg total) by mouth daily as needed for mild pain or headache. 05/27/17  Yes Doree Fudge M, PA-C  albuterol (PROVENTIL HFA;VENTOLIN HFA) 108 (90 Base) MCG/ACT inhaler Inhale 2 puffs into the lungs every 6 (six) hours as needed for wheezing  or shortness of breath. 04/20/17  Yes Glori Luis, MD  amLODipine (NORVASC) 10 MG tablet Take 1 tablet (10 mg total) by mouth daily. 06/01/17  Yes Clarisa Kindred A, FNP  aspirin EC 325 MG EC tablet Take 1 tablet (325 mg total) by mouth daily. 05/27/17  Yes Doree Fudge M, PA-C  loratadine (CLARITIN) 10 MG tablet Take 10 mg by mouth daily.    Yes [provider]  losartan (COZAAR) 50 MG tablet Take 1 tablet (50 mg total) by mouth daily. 09/18/17  Yes Clarisa Kindred A, FNP  metoprolol tartrate  (LOPRESSOR) 50 MG tablet Take 1 tablet (50 mg total) by mouth 2 (two) times daily. 07/06/17  Yes Clarisa Kindred A, FNP  RaNITidine HCl (ZANTAC PO) Take 150 mg by mouth daily.    Yes [provider]  rosuvastatin (CRESTOR) 20 MG tablet Take 1 tablet (20 mg total) by mouth daily. 09/18/17  Yes Delma Freeze, FNP     Review of Systems  Constitutional: Positive for fatigue. Negative for appetite change.  HENT: Negative for congestion, postnasal drip and sore throat.   Eyes: Negative.   Respiratory: Positive for shortness of breath (improving). Negative for cough and chest tightness.   Cardiovascular: Negative for chest pain, palpitations and leg swelling.  Gastrointestinal: Negative for abdominal distention and abdominal pain.  Endocrine: Negative.   Genitourinary: Negative.   Musculoskeletal: Positive for arthralgias (left leg pain). Negative for back pain and neck pain.  Skin: Negative.        Healing CABG incision  Allergic/Immunologic: Negative.   Neurological: Negative for dizziness and light-headedness.  Hematological: Negative for adenopathy. Does not bruise/bleed easily.  Psychiatric/Behavioral: Positive for sleep disturbance (sleeping in recliner some since CABG due to pain).   Vitals:   10/06/17 1109  BP: 128/66  Pulse: 61  Resp: 18  SpO2: 100%  Weight: 218 lb 6 oz (99.1 kg)  Height: 5\' 9"  (1.753 m)   Wt Readings from Last 3 Encounters:  10/06/17 218 lb 6 oz (99.1 kg)  08/27/17 215 lb (97.5 kg)  07/27/17 214 lb (97.1 kg)   Lab Results  Component Value Date   CREATININE 0.96 07/27/2017   CREATININE 1.00 05/26/2017   CREATININE 0.91 05/25/2017    Physical Exam  Constitutional: She is oriented to person, place, and time. She appears well-developed and well-nourished.  HENT:  Head: Normocephalic and atraumatic.  Neck: Normal range of motion. Neck supple. No JVD present.  Cardiovascular: Normal rate and regular rhythm.  Pulmonary/Chest: Effort normal. No  respiratory distress. She has no wheezes. She has no rales.  Abdominal: Soft. She exhibits no distension.  Musculoskeletal: She exhibits no edema or tenderness.  Neurological: She is alert and oriented to person, place, and time.  Skin: Skin is warm and dry.  Healing sternal incision/ no signs of infection  Psychiatric: She has a normal mood and affect. Her behavior is normal.  Nursing note and vitals reviewed.   Assessment & Plan:  1: Chronic heart failure with preserved ejection fraction- - NYHA class II - euvolemic today - weighing daily. Discussed the importance of calling for an overnight weight gain of >2 pounds or a weekly weight gain of >5 pounds - weight up 9 pounds since she was last here 3 months ago - not adding salt and trying to keep her daily sodium intake to 2000mg  daily - saw cardiology Welton Flakes)  - BNP on 05/16/17 was 461.0  2: HTN- - BP looks good today - saw  PCP Birdie Sons) 07/27/17 - BMP on 07/27/17 reviewed and showed sodium 141, potassium 4.0, creatinine 0.96  and GFR 61.51  3: NSTEMI- - CABG done 05/22/17  - saw cardiac surgeon 08/27/17 - cardiac rehab referral made by local cardiologist; unable to work cardiac rehab into her work schedule - she works at [employer] and says that she's quite active at work  Medication list was reviewed.   Return in 6 months or sooner for any questions/problems before then.

## 2017-10-06 ENCOUNTER — Ambulatory Visit: Payer: BLUE CROSS/BLUE SHIELD | Attending: Family | Admitting: Family

## 2017-10-06 ENCOUNTER — Encounter: Payer: Self-pay | Admitting: Family

## 2017-10-06 VITALS — BP 128/66 | HR 61 | Resp 18 | Ht 69.0 in | Wt 218.4 lb

## 2017-10-06 DIAGNOSIS — Z7982 Long term (current) use of aspirin: Secondary | ICD-10-CM | POA: Diagnosis not present

## 2017-10-06 DIAGNOSIS — Z888 Allergy status to other drugs, medicaments and biological substances status: Secondary | ICD-10-CM | POA: Diagnosis not present

## 2017-10-06 DIAGNOSIS — Z90710 Acquired absence of both cervix and uterus: Secondary | ICD-10-CM | POA: Diagnosis not present

## 2017-10-06 DIAGNOSIS — I10 Essential (primary) hypertension: Secondary | ICD-10-CM

## 2017-10-06 DIAGNOSIS — Z9889 Other specified postprocedural states: Secondary | ICD-10-CM | POA: Diagnosis not present

## 2017-10-06 DIAGNOSIS — I2510 Atherosclerotic heart disease of native coronary artery without angina pectoris: Secondary | ICD-10-CM | POA: Diagnosis not present

## 2017-10-06 DIAGNOSIS — I5032 Chronic diastolic (congestive) heart failure: Secondary | ICD-10-CM | POA: Diagnosis not present

## 2017-10-06 DIAGNOSIS — Z803 Family history of malignant neoplasm of breast: Secondary | ICD-10-CM | POA: Insufficient documentation

## 2017-10-06 DIAGNOSIS — Z951 Presence of aortocoronary bypass graft: Secondary | ICD-10-CM | POA: Diagnosis not present

## 2017-10-06 DIAGNOSIS — Z79899 Other long term (current) drug therapy: Secondary | ICD-10-CM | POA: Diagnosis not present

## 2017-10-06 DIAGNOSIS — N189 Chronic kidney disease, unspecified: Secondary | ICD-10-CM | POA: Insufficient documentation

## 2017-10-06 DIAGNOSIS — I130 Hypertensive heart and chronic kidney disease with heart failure and stage 1 through stage 4 chronic kidney disease, or unspecified chronic kidney disease: Secondary | ICD-10-CM | POA: Diagnosis not present

## 2017-10-06 DIAGNOSIS — Z823 Family history of stroke: Secondary | ICD-10-CM | POA: Diagnosis not present

## 2017-10-06 DIAGNOSIS — Z8249 Family history of ischemic heart disease and other diseases of the circulatory system: Secondary | ICD-10-CM | POA: Diagnosis not present

## 2017-10-06 DIAGNOSIS — I252 Old myocardial infarction: Secondary | ICD-10-CM | POA: Diagnosis not present

## 2017-10-06 DIAGNOSIS — I214 Non-ST elevation (NSTEMI) myocardial infarction: Secondary | ICD-10-CM

## 2017-10-06 NOTE — Patient Instructions (Signed)
Continue weighing daily and call for an overnight weight gain of > 2 pounds or a weekly weight gain of >5 pounds. 

## 2017-10-09 ENCOUNTER — Ambulatory Visit (INDEPENDENT_AMBULATORY_CARE_PROVIDER_SITE_OTHER): Payer: Medicare Other | Admitting: Urology

## 2017-10-09 ENCOUNTER — Encounter: Payer: Self-pay | Admitting: Urology

## 2017-10-09 VITALS — BP 154/78 | HR 76 | Ht 69.0 in | Wt 217.0 lb

## 2017-10-09 DIAGNOSIS — N1330 Unspecified hydronephrosis: Secondary | ICD-10-CM

## 2017-10-09 NOTE — Progress Notes (Signed)
10/09/2017 4:44 PM   Audrey Wolf 1950/07/31 [id number]  Referring provider: Anthonette Legato, MD [place], [place] [id number]  CC: History of left hydronephrosis  HPI: I had the pleasure of seeing Audrey Wolf in urology clinic today for possible left hydronephrosis.  She is a 67 year old female who recently underwent cardiac bypass in April 2019.  She has a complex stone history reportedly with multiple large stones on the left side and has undergone numerous shockwave lithotripsies and stent placements previously.  Her outside notes are not entirely clear.  She does not have any recent cross-sectional imaging of the abdomen.  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  Her last renal function testing was June 2019, and her creatinine was normal at 0.96 with an eGFR of 62.  She is completely asymptomatic, and denies flank pain, gross hematuria, or any recent stone passage.   PMH: Past Medical History:  Diagnosis Date  . Arthritis   . Asthma   . CHF (congestive heart failure) ([place])   . Chronic kidney disease   . Coronary artery disease   . GERD (gastroesophageal reflux disease)   . Headache   . Hypertension   . Nephrolithiasis   . NSTEMI (non-ST elevated myocardial infarction) [facility])     Surgical History: Past Surgical History:  Procedure Laterality Date  . ABDOMINAL HYSTERECTOMY    . APPENDECTOMY    . BREAST BIOPSY Left 12/31/2010   neg  . CORONARY ARTERY BYPASS GRAFT N/A 05/22/2017   Procedure: CORONARY ARTERY BYPASS GRAFTING (CABG) x 2 WITH ENDOSCOPIC HARVESTING OF RIGHT SAPHENOUS VEIN;  Surgeon: Grace Isaac, MD;  Location: [place];  Service: Open Heart Surgery;  Laterality: N/A;  . LEFT HEART CATH AND CORONARY ANGIOGRAPHY N/A 05/18/2017   Procedure: LEFT HEART CATH AND CORONARY ANGIOGRAPHY;  Surgeon: Dionisio David, MD;  Location: [place] CV LAB;  Service: Cardiovascular;  Laterality:  N/A;  . LITHOTRIPSY    . TEE WITHOUT CARDIOVERSION N/A 05/22/2017   Procedure: TRANSESOPHAGEAL ECHOCARDIOGRAM (TEE);  Surgeon: Grace Isaac, MD;  Location: [place];  Service: Open Heart Surgery;  Laterality: N/A;  . TONSILLECTOMY      Home Medications:  Allergies as of 10/09/2017      Reactions   Lisinopril Cough   Ace Inhibitors Other (See Comments)   Has been told to avoid these and any diuretics because "left kidney has shrunk up and doesn't work"      Medication List        Accurate as of 10/09/17  4:44 PM. Always use your most recent med list.          acetaminophen 500 MG tablet Commonly known as:  TYLENOL Take 1 tablet (500 mg total) by mouth daily as needed for mild pain or headache.   albuterol 108 (90 Base) MCG/ACT inhaler Commonly known as:  PROVENTIL HFA;VENTOLIN HFA Inhale 2 puffs into the lungs every 6 (six) hours as needed for wheezing or shortness of breath.   amLODipine 10 MG tablet Commonly known as:  NORVASC Take 1 tablet (10 mg total) by mouth daily.   aspirin 325 MG EC tablet Take 1 tablet (325 mg total) by mouth daily.   loratadine 10 MG tablet Commonly known as:  CLARITIN Take 10 mg by mouth daily.   losartan 50 MG tablet Commonly known as:  COZAAR Take 1 tablet (50 mg total) by mouth daily.  metoprolol tartrate 50 MG tablet Commonly known as:  LOPRESSOR Take 1 tablet (50 mg total) by mouth 2 (two) times daily.   rosuvastatin 20 MG tablet Commonly known as:  CRESTOR Take 1 tablet (20 mg total) by mouth daily.   ZANTAC PO Take 150 mg by mouth daily.       Allergies:  Allergies  Allergen Reactions  . Lisinopril Cough  . Ace Inhibitors Other (See Comments)    Has been told to avoid these and any diuretics because "left kidney has shrunk up and doesn't work"    Family History: Family History  Problem Relation Age of Onset  . Arthritis Mother   . Arthritis Father   . Heart disease Father   . Stroke Father   . Sudden Cardiac  Death Father   . Breast cancer Maternal Aunt     Social History:  reports that she has never smoked. She has never used smokeless tobacco. She reports that she does not drink alcohol or use drugs.  ROS: Please see flowsheet from today's date for complete review of systems.  Physical Exam: BP (!) 154/78   Pulse 76   Ht 5' 9"  (1.753 m)   Wt 217 lb (98.4 kg)   BMI 32.05 kg/m    Constitutional:  Alert and oriented, No acute distress. Cardiovascular: No clubbing, cyanosis, or edema. Respiratory: Normal respiratory effort, no increased work of breathing. GI: Abdomen is soft, nontender, nondistended, no abdominal masses GU: No CVA tenderness Lymph: No cervical or inguinal lymphadenopathy. Skin: No rashes, bruises or suspicious lesions. Neurologic: Grossly intact, no focal deficits, moving all 4 extremities. Psychiatric: Normal mood and affect.  Assessment & Plan:   In summary, Audrey Wolf is a 67 year old female with recent cardiac bypass and alleged history of left hydronephrosis and atrophic kidney.  There is no cross-sectional imaging for me to review today.  Her renal function is normal.  She is completely asymptomatic.  I recommended obtaining a renal ultrasound as a baseline to evaluate her kidneys bilaterally.  If there is an abnormality found we can obtain further cross-sectional imaging if needed. I will call her with the results of her renal ultrasound to determine the next steps.  Billey Co, [facility] [address], [place], [place] [address] [phone]

## 2017-10-20 ENCOUNTER — Ambulatory Visit
Admission: RE | Admit: 2017-10-20 | Discharge: 2017-10-20 | Disposition: A | Discharge status: Home / Self Care | Payer: BLUE CROSS/BLUE SHIELD | Source: Ambulatory Visit | Attending: Urology | Admitting: Urology

## 2017-10-20 DIAGNOSIS — N1330 Unspecified hydronephrosis: Secondary | ICD-10-CM

## 2017-10-20 DIAGNOSIS — N261 Atrophy of kidney (terminal): Secondary | ICD-10-CM | POA: Insufficient documentation

## 2017-10-20 DIAGNOSIS — N132 Hydronephrosis with renal and ureteral calculous obstruction: Secondary | ICD-10-CM | POA: Diagnosis not present

## 2017-10-27 ENCOUNTER — Telehealth: Payer: Self-pay | Admitting: Urology

## 2017-10-27 NOTE — Telephone Encounter (Signed)
App mailed to patient   MB

## 2017-11-10 ENCOUNTER — Ambulatory Visit (INDEPENDENT_AMBULATORY_CARE_PROVIDER_SITE_OTHER): Payer: BLUE CROSS/BLUE SHIELD | Admitting: Family Medicine

## 2017-11-10 ENCOUNTER — Encounter: Payer: Self-pay | Admitting: Family Medicine

## 2017-11-10 VITALS — BP 158/70 | HR 100 | Temp 98.1°F | Ht 69.0 in | Wt 216.0 lb

## 2017-11-10 DIAGNOSIS — R918 Other nonspecific abnormal finding of lung field: Secondary | ICD-10-CM

## 2017-11-10 DIAGNOSIS — E785 Hyperlipidemia, unspecified: Secondary | ICD-10-CM | POA: Diagnosis not present

## 2017-11-10 DIAGNOSIS — J309 Allergic rhinitis, unspecified: Secondary | ICD-10-CM

## 2017-11-10 DIAGNOSIS — I10 Essential (primary) hypertension: Secondary | ICD-10-CM | POA: Diagnosis not present

## 2017-11-10 DIAGNOSIS — R1013 Epigastric pain: Secondary | ICD-10-CM | POA: Diagnosis not present

## 2017-11-10 DIAGNOSIS — R911 Solitary pulmonary nodule: Secondary | ICD-10-CM

## 2017-11-10 DIAGNOSIS — D649 Anemia, unspecified: Secondary | ICD-10-CM

## 2017-11-10 DIAGNOSIS — I2510 Atherosclerotic heart disease of native coronary artery without angina pectoris: Secondary | ICD-10-CM

## 2017-11-10 LAB — LIPID PANEL
Cholesterol: 94 mg/dL (ref 0–200)
HDL: 32.9 mg/dL — AB (ref >39.00)
LDL CALC: 41 mg/dL (ref 0–99)
NonHDL: 61.33
Total CHOL/HDL Ratio: 3
Triglycerides: 102 mg/dL (ref 0.0–149.0)
VLDL: 20.4 mg/dL (ref 0.0–40.0)

## 2017-11-10 LAB — COMPREHENSIVE METABOLIC PANEL
ALT: 22 U/L (ref 0–35)
AST: 23 U/L (ref 0–37)
Albumin: 4 g/dL (ref 3.5–5.2)
Alkaline Phosphatase: 70 U/L (ref 39–117)
BILIRUBIN TOTAL: 0.5 mg/dL (ref 0.2–1.2)
BUN: 20 mg/dL (ref 6–23)
CALCIUM: 9 mg/dL (ref 8.4–10.5)
CO2: 24 meq/L (ref 19–32)
CREATININE: 0.95 mg/dL (ref 0.40–1.20)
Chloride: 108 mEq/L (ref 96–112)
GFR: 62.2 mL/min (ref >60.00)
GLUCOSE: 125 mg/dL — AB (ref 70–99)
Potassium: 3.6 mEq/L (ref 3.5–5.1)
SODIUM: 140 meq/L (ref 135–145)
Total Protein: 6.7 g/dL (ref 6.0–8.3)

## 2017-11-10 LAB — CBC WITH DIFFERENTIAL/PLATELET
BASOS ABS: 0.1 10*3/uL (ref 0.0–0.1)
Basophils Relative: 0.7 % (ref 0.0–3.0)
EOS ABS: 0.3 10*3/uL (ref 0.0–0.7)
Eosinophils Relative: 3.5 % (ref 0.0–5.0)
HCT: 36.6 % (ref 36.0–46.0)
Hemoglobin: 12.5 g/dL (ref 12.0–15.0)
LYMPHS ABS: 2 10*3/uL (ref 0.7–4.0)
LYMPHS PCT: 27.5 % (ref 12.0–46.0)
MCHC: 34.1 g/dL (ref 30.0–36.0)
MCV: 88.9 fl (ref 78.0–100.0)
Monocytes Absolute: 0.4 10*3/uL (ref 0.1–1.0)
Monocytes Relative: 6.1 % (ref 3.0–12.0)
NEUTROS ABS: 4.5 10*3/uL (ref 1.4–7.7)
Neutrophils Relative %: 62.2 % (ref 43.0–77.0)
PLATELETS: 234 10*3/uL (ref 150.0–400.0)
RBC: 4.12 Mil/uL (ref 3.87–5.11)
RDW: 14.3 % (ref 11.5–15.5)
WBC: 7.3 10*3/uL (ref 4.0–10.5)

## 2017-11-10 LAB — LIPASE: Lipase: 19 U/L (ref 11.0–59.0)

## 2017-11-10 MED ORDER — FLUTICASONE PROPIONATE 50 MCG/ACT NA SUSP: 2.0000 | Freq: Every day | NASAL | 6 refills | Status: DC

## 2017-11-10 NOTE — Assessment & Plan Note (Signed)
Check lipid panel  

## 2017-11-10 NOTE — Assessment & Plan Note (Addendum)
Symptoms seem consistent with gastritis particularly given location and improvement with Zantac.  Potentially could be a gallbladder issue.  We will start with basic lab work to evaluate for potential causes.  Discussed possible need for right upper quadrant ultrasound.  Given return precautions.  She will switch to Pepcid given recent recall of Zantac.  She will do this twice daily for a month to see if that will resolve her symptoms.

## 2017-11-10 NOTE — Progress Notes (Signed)
Tommi Rumps, MD Phone: [phone]  Audrey Wolf is a 67 y.o. female who presents today for f/u.  CC: cough, htn, epigastric discomfort  HYPERTENSION  Disease Monitoring  Home BP Monitoring 116/60 Chest pain- no    Dyspnea- no Medications  Compliance-  Taking amlodipine, losartan, metoprolol  Edema- no  Cough: Patient notes starting 1 week ago she developed rhinorrhea, postnasal drip, then lost her voice.  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  The next day she had cough.  It is nonproductive.  She said no fevers.  No significant sinus congestion.  She notes on day 2 of the illness she went to urgent care for evaluation and they did a chest x-ray.  She brought the report in and it appeared to be negative for acute changes.  They placed her on Augmentin and cough syrup.  She notes she continues to cough.  No significant improvement with the antibiotic.  She does take an allergy pill.  Epigastric pain: Patient notes intermittently for some time now she will have epigastric and upper abdominal stomach discomfort after eating fatty foods.  Notes it is not solely located in the right upper quadrant.  No diarrhea, nausea, or vomiting.  No blood in her stool.  No melena.  She still has her gallbladder.  She takes Zantac and that will resolve the symptoms when she has them.  Lung nodule: Noted on review of prior imaging.  There is recommendation for 3 to 73-monthfollow-up.  This will be ordered today.  Social History   Tobacco Use  Smoking Status Never Smoker  Smokeless Tobacco Never Used     ROS see history of present illness  Objective  Physical Exam Vitals:   11/10/17 0857  BP: (!) 158/70  Pulse: 100  Temp: 98.1 F (36.7 C)  SpO2: 99%    BP Readings from Last 3 Encounters:  11/10/17 (!) 158/70  10/09/17 (!) 154/78  10/06/17 128/66   Wt Readings from Last 3 Encounters:  11/10/17 216 lb (98 kg)  10/09/17 217 lb (98.4 kg)    10/06/17 218 lb 6 oz (99.1 kg)    Physical Exam  Constitutional: No distress.  HENT:  Head: Normocephalic and atraumatic.  Mild posterior oropharyngeal erythema with no exudate, bilateral TMs obscured by cerumen, irrigated by CMA revealing normal TMs, tolerated well by patient  Eyes: Pupils are equal, round, and reactive to light. Conjunctivae are normal.  Neck: Neck supple.  Cardiovascular: Normal rate, regular rhythm and normal heart sounds.  Pulmonary/Chest: Effort normal and breath sounds normal.  Abdominal: Soft. Bowel sounds are normal. She exhibits no distension. There is no tenderness. There is no rebound and no guarding.  Musculoskeletal: She exhibits no edema.  Lymphadenopathy:    She has no cervical adenopathy.  Neurological: She is alert.  Skin: Skin is warm and dry. She is not diaphoretic.     Assessment/Plan: Please see individual problem list.  Hypertension Controlled at home.  She will continue her current regimen.  Hyperlipidemia Check lipid panel.  Epigastric pain Symptoms seem consistent with gastritis particularly given location and improvement with Zantac.  Potentially could be a gallbladder issue.  We will start with basic lab work to evaluate for potential causes.  Discussed possible need for right upper quadrant ultrasound.  Given return precautions.  She will switch to Pepcid given recent recall of Zantac.  She will do this twice daily for a month to see if that will resolve her symptoms.  Anemia Postsurgical anemia.  This has resolved on recent check.  Allergic rhinitis I suspect her current symptoms are likely allergy related given excessive dust that she has been exposed to at work.  I do not believe she warrants further antibiotic therapy.  There are no focal findings to indicate bacterial illness.  We will treat with Flonase and her current allergy medication.  If not improving she will let us know.  Incidental lung nodule, > 47m and < 813mNoted  on review of prior imaging.  They recommended 3 to 6-61-monthllow-up.  CT scan has been ordered.  Discussed getting this scheduled with referral coordinator.    Orders Placed This Encounter  Procedures  . CT Chest Wo Contrast    Standing Status:   Future    Standing Expiration Date:   01/11/2019    Order Specific Question:   Preferred imaging location?    Answer:   [facility]    Order Specific Question:   Radiology Contrast Protocol - do NOT remove file path    Answer:   \\[email or web address]\CTProtocols.pdf  . Comp Met (CMET)  . CBC w/Diff  . Lipase  . Lipid panel    Meds ordered this encounter  Medications  . fluticasone (FLONASE) 50 MCG/ACT nasal spray    Sig: Place 2 sprays into both nostrils daily.    Dispense:  16 g    Ref[place]:  6  [place]

## 2017-11-10 NOTE — Patient Instructions (Addendum)
Nice to see you. We will treat you with Flonase.  Please see if this will help with your cough and runny nose. Please switch to Pepcid and try to take this twice daily for the next month to see if that resolves her symptoms.  We will get some lab work today and then determine if you need an ultrasound of your gallbladder. We will get a CT scan scheduled to follow-up on the lung nodule that was previously seen.

## 2017-11-10 NOTE — Assessment & Plan Note (Signed)
Noted on review of prior imaging.  They recommended 3 to 26-month follow-up.  CT scan has been ordered.  Discussed getting this scheduled with referral coordinator.

## 2017-11-10 NOTE — Assessment & Plan Note (Signed)
Postsurgical anemia.  This has resolved on recent check.

## 2017-11-10 NOTE — Assessment & Plan Note (Signed)
Controlled at home.  She will continue her current regimen.

## 2017-11-10 NOTE — Assessment & Plan Note (Signed)
I suspect her current symptoms are likely allergy related given excessive dust that she has been exposed to at work.  I do not believe she warrants further antibiotic therapy.  There are no focal findings to indicate bacterial illness.  We will treat with Flonase and her current allergy medication.  If not improving she will let us know.

## 2017-11-17 ENCOUNTER — Other Ambulatory Visit: Payer: Self-pay

## 2017-11-17 ENCOUNTER — Other Ambulatory Visit: Payer: Self-pay | Admitting: Family Medicine

## 2017-11-17 ENCOUNTER — Ambulatory Visit (INDEPENDENT_AMBULATORY_CARE_PROVIDER_SITE_OTHER): Payer: Medicare Other | Admitting: Urology

## 2017-11-17 ENCOUNTER — Encounter: Payer: Self-pay | Admitting: Urology

## 2017-11-17 VITALS — BP 137/71 | HR 73 | Ht 69.0 in | Wt 200.0 lb

## 2017-11-17 DIAGNOSIS — N131 Hydronephrosis with ureteral stricture, not elsewhere classified: Secondary | ICD-10-CM | POA: Diagnosis not present

## 2017-11-17 DIAGNOSIS — R1011 Right upper quadrant pain: Secondary | ICD-10-CM

## 2017-11-17 NOTE — Progress Notes (Signed)
   11/17/2017 4:06 PM   Audrey Wolf 01/13/1951 [id number]  Reason for visit: Follow up renal ultrasound results  HPI: I saw Audrey Wolf back in urology clinic today to discuss her renal ultrasound results.  She is a 67 year old female with a recent cardiac bypass in April 2019 and a complex urologic history.  She was previously followed by Dr.Cope until establishing care with me in September 2019.  She is completely asymptomatic from a urology standpoint, with no urinary infections, flank pain, or hematuria.  From chart review, it seems that in 2014 she had a obstructing left distal ureteral stone and a possible ureteral stricture.  In 2015 there is an operative note from Dr. Achilles Dunk in which she attempted to treat the stone, however he noted only scar tissue on ureteroscopy and was unable to pass a wire to treat the stone or place a stent.  He elected observation at that time.  On review of her CT scan from 2014, she has severe left hydronephrosis with very minimal residual renal parenchyma and a left distal ureteral stone.  It appears Dr. Achilles Dunk discussed possible nephrectomy with her, however this was never pursued.  She reportedly has a history of poorly controlled hypertension, however she has normal blood pressure currently on a single medication (amlodipine).  Her renal ultrasound from 10/21/2017 does confirm severe left-sided hydronephrosis with very minimal residual parenchyma.  Recent serum creatinine is 0.95.    ROS: Please see flowsheet from today's date for complete review of systems.  Physical Exam: BP 137/71   Pulse 73   Ht 5\' 9"  (1.753 m)   Wt 200 lb (90.7 kg)   BMI 29.53 kg/m    Constitutional:  Alert and oriented, No acute distress. Respiratory: Normal respiratory effort, no increased work of breathing. GI: Abdomen is soft, nontender, nondistended, no abdominal masses GU: No CVA tenderness Skin: No rashes, bruises or suspicious lesions. Neurologic: Grossly  intact, no focal deficits, moving all 4 extremities. Psychiatric: Normal mood and affect  Laboratory Data: Creatinine 0.95  Pertinent Imaging: I have personally reviewed the CT abdomen pelvis from 2014, as well as the renal ultrasound from September 2019.  Findings discussed in HPI.  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  She is completely asymptomatic with good renal function and normal blood pressure.  We discussed options moving forward including observation versus hand-assisted laparoscopic left nephrectomy.  As she has been asymptomatic for greater than 5 years, and her blood pressure is currently very well controlled, I would lean towards observation at this time.  She is in agreement, and would like to recover from her recent CABG.  I stressed the importance of presenting to the emergency room immediately if she develops fever and flank pain, or if she develops right-sided flank pain and/or anuria.  Follow-up in 6 to 7 months with BMP and blood pressure  Sondra Come, MD  [facility] [address], [address] [place], [place] [address] [phone]

## 2017-11-24 ENCOUNTER — Ambulatory Visit
Admission: RE | Admit: 2017-11-24 | Discharge: 2017-11-24 | Disposition: A | Discharge status: Home / Self Care | Payer: BLUE CROSS/BLUE SHIELD | Source: Ambulatory Visit | Attending: Family Medicine | Admitting: Family Medicine

## 2017-11-24 DIAGNOSIS — R911 Solitary pulmonary nodule: Secondary | ICD-10-CM | POA: Diagnosis not present

## 2017-11-24 DIAGNOSIS — R918 Other nonspecific abnormal finding of lung field: Secondary | ICD-10-CM

## 2017-12-07 ENCOUNTER — Telehealth: Payer: Self-pay

## 2017-12-07 NOTE — Telephone Encounter (Signed)
Copied from CRM [id number]. Topic: General - Other >> Dec 07, 2017  1:39 PM Marylen Ponto wrote: Reason for CRM: Pt returned call to office. Pt states she would like the ultrasound scheduled either on a Tuesday or Friday because those are her days off of work. Pt requests call back.

## 2017-12-08 NOTE — Telephone Encounter (Signed)
Pt's preference for ultrasound.

## 2017-12-08 NOTE — Telephone Encounter (Signed)
Thank you pt has been scheduled

## 2017-12-15 ENCOUNTER — Ambulatory Visit
Admission: RE | Admit: 2017-12-15 | Discharge: 2017-12-15 | Disposition: A | Discharge status: Home / Self Care | Payer: BLUE CROSS/BLUE SHIELD | Source: Ambulatory Visit | Attending: Family Medicine | Admitting: Family Medicine

## 2017-12-15 DIAGNOSIS — K8020 Calculus of gallbladder without cholecystitis without obstruction: Secondary | ICD-10-CM | POA: Insufficient documentation

## 2017-12-15 DIAGNOSIS — R1011 Right upper quadrant pain: Secondary | ICD-10-CM | POA: Diagnosis present

## 2018-01-28 ENCOUNTER — Other Ambulatory Visit: Payer: Self-pay | Admitting: Family

## 2018-03-23 ENCOUNTER — Telehealth: Payer: Self-pay | Admitting: *Deleted

## 2018-03-23 NOTE — Telephone Encounter (Signed)
Copied from CRM [id number]. Topic: General - Other >> Mar 23, 2018 12:11 PM Gerrianne Scale wrote: Reason for CRM: pt calling to see if she can get an earlier appt with Birdie Sons to f/u on her U/S  she has an appt on 07-05-18

## 2018-03-23 NOTE — Telephone Encounter (Signed)
Sent to PCP to advise if able to see pt sooner.

## 2018-03-23 NOTE — Telephone Encounter (Signed)
I am happy to see her at 430 on a Monday or Wednesday.  It appears that I wanted to refer her to a surgeon previously though I did not get the result note back after the PEC spoke with her.  If she would like the referral I can go ahead and place it.

## 2018-03-23 NOTE — Telephone Encounter (Signed)
Called and spoke with patient. Pt is aware that she has been scheduled for an appt on 03/31/2018 @ 4:30 to f/u on [place].   Pt wanted to hold off on the referral until she follows up with PCP.   Sent to [place] to schedule appt

## 2018-03-24 NOTE — Telephone Encounter (Signed)
scheduled

## 2018-03-24 NOTE — Telephone Encounter (Signed)
Sent to PCP as an FYI  

## 2018-03-31 ENCOUNTER — Encounter: Payer: Self-pay | Admitting: Family Medicine

## 2018-03-31 ENCOUNTER — Ambulatory Visit (INDEPENDENT_AMBULATORY_CARE_PROVIDER_SITE_OTHER): Payer: BLUE CROSS/BLUE SHIELD | Admitting: Family Medicine

## 2018-03-31 ENCOUNTER — Ambulatory Visit (INDEPENDENT_AMBULATORY_CARE_PROVIDER_SITE_OTHER): Payer: BLUE CROSS/BLUE SHIELD

## 2018-03-31 VITALS — BP 160/70 | HR 78 | Temp 98.1°F | Wt 224.4 lb

## 2018-03-31 DIAGNOSIS — M5442 Lumbago with sciatica, left side: Secondary | ICD-10-CM

## 2018-03-31 DIAGNOSIS — I10 Essential (primary) hypertension: Secondary | ICD-10-CM

## 2018-03-31 DIAGNOSIS — M545 Low back pain: Secondary | ICD-10-CM | POA: Diagnosis not present

## 2018-03-31 DIAGNOSIS — R1013 Epigastric pain: Secondary | ICD-10-CM | POA: Diagnosis not present

## 2018-03-31 DIAGNOSIS — R7303 Prediabetes: Secondary | ICD-10-CM | POA: Insufficient documentation

## 2018-03-31 DIAGNOSIS — R7309 Other abnormal glucose: Secondary | ICD-10-CM

## 2018-03-31 NOTE — Assessment & Plan Note (Signed)
Elevated today though she did not take her blood pressure medication.  Encouraged her to take this and she will return in 1 week for BP check with nursing.

## 2018-03-31 NOTE — Patient Instructions (Signed)
Nice to see you. Please take your blood pressure medicine at home. Please do these exercises for your back.  We will contact you with your x-ray results. We will get you to see GI for your stomach discomfort.   Back Exercises If you have pain in your back, do these exercises 2-3 times each day or as told by your doctor. When the pain goes away, do the exercises once each day, but repeat the steps more times for each exercise (do more repetitions). If you do not have pain in your back, do these exercises once each day or as told by your doctor. Exercises Single Knee to Chest Do these steps 3-5 times in a row for each leg: 1. Lie on your back on a firm bed or the floor with your legs stretched out. 2. Bring one knee to your chest. 3. Hold your knee to your chest by grabbing your knee or thigh. 4. Pull on your knee until you feel a gentle stretch in your lower back. 5. Keep doing the stretch for 10-30 seconds. 6. Slowly let go of your leg and straighten it. Pelvic Tilt Do these steps 5-10 times in a row: 1. Lie on your back on a firm bed or the floor with your legs stretched out. 2. Bend your knees so they point up to the ceiling. Your feet should be flat on the floor. 3. Tighten your lower belly (abdomen) muscles to press your lower back against the floor. This will make your tailbone point up to the ceiling instead of pointing down to your feet or the floor. 4. Stay in this position for 5-10 seconds while you gently tighten your muscles and breathe evenly. Cat-Cow Do these steps until your lower back bends more easily: 1. Get on your hands and knees on a firm surface. Keep your hands under your shoulders, and keep your knees under your hips. You may put padding under your knees. 2. Let your head hang down, and make your tailbone point down to the floor so your lower back is round like the back of a cat. 3. Stay in this position for 5 seconds. 4. Slowly lift your head and make your tailbone  point up to the ceiling so your back hangs low (sags) like the back of a cow. 5. Stay in this position for 5 seconds.  Press-Ups Do these steps 5-10 times in a row: 1. Lie on your belly (face-down) on the floor. 2. Place your hands near your head, about shoulder-width apart. 3. While you keep your back relaxed and keep your hips on the floor, slowly straighten your arms to raise the top half of your body and lift your shoulders. Do not use your back muscles. To make yourself more comfortable, you may change where you place your hands. 4. Stay in this position for 5 seconds. 5. Slowly return to lying flat on the floor.  Bridges Do these steps 10 times in a row: 1. Lie on your back on a firm surface. 2. Bend your knees so they point up to the ceiling. Your feet should be flat on the floor. 3. Tighten your butt muscles and lift your butt off of the floor until your waist is almost as high as your knees. If you do not feel the muscles working in your butt and the back of your thighs, slide your feet 1-2 inches farther away from your butt. 4. Stay in this position for 3-5 seconds. 5. Slowly lower your butt to  the floor, and let your butt muscles relax. If this exercise is too easy, try doing it with your arms crossed over your chest. Belly Crunches Do these steps 5-10 times in a row: 1. Lie on your back on a firm bed or the floor with your legs stretched out. 2. Bend your knees so they point up to the ceiling. Your feet should be flat on the floor. 3. Cross your arms over your chest. 4. Tip your chin a little bit toward your chest but do not bend your neck. 5. Tighten your belly muscles and slowly raise your chest just enough to lift your shoulder blades a tiny bit off of the floor. 6. Slowly lower your chest and your head to the floor. Back Lifts Do these steps 5-10 times in a row: 1. Lie on your belly (face-down) with your arms at your sides, and rest your forehead on the floor. 2. Tighten  the muscles in your legs and your butt. 3. Slowly lift your chest off of the floor while you keep your hips on the floor. Keep the back of your head in line with the curve in your back. Look at the floor while you do this. 4. Stay in this position for 3-5 seconds. 5. Slowly lower your chest and your face to the floor. Contact a doctor if:  Your back pain gets a lot worse when you do an exercise.  Your back pain does not lessen 2 hours after you exercise. If you have any of these problems, stop doing the exercises. Do not do them again unless your doctor says it is okay. Get help right away if:  You have sudden, very bad back pain. If this happens, stop doing the exercises. Do not do them again unless your doctor says it is okay. This information is not intended to replace advice given to you by your health care provider. Make sure you discuss any questions you have with your health care provider. Document Released: 02/22/2010 Document Revised: 10/14/2017 Document Reviewed: 03/16/2014 Elsevier Interactive Patient Education  [employer].

## 2018-03-31 NOTE — Assessment & Plan Note (Signed)
Check A1c and BMP.

## 2018-03-31 NOTE — Assessment & Plan Note (Signed)
Muscular strain versus disc issue with nerve impingement.  Given possible radicular symptoms will obtain an x-ray.  Given exercises to complete at home.

## 2018-03-31 NOTE — Assessment & Plan Note (Signed)
Undetermined cause.  History does not sound as though this would be related to cholelithiasis.  Potentially could be gastritis or ulcer related.  No clear indicator for gastric ulcer or duodenal ulcer.  Will refer to GI to consider EGD.

## 2018-03-31 NOTE — Progress Notes (Signed)
[name], MD Phone: [phone]  Audrey Wolf is a 68 y.o. female who presents today for follow-up.  CC: Epigastric pain, left leg pain, hypertension  Epigastric pain: Patient notes intermittent issues with a pressure sensation in her epigastric region.  Sometimes it occurs before she eats and sometimes it occurs after she eats.  Sometimes it is relieved by eating.  Almost always occurs around the time that she eats.  No chest pain, shortness of breath, radiation of the pain, diaphoresis, nausea, vomiting, diarrhea, or blood in her stool.  Prior ultrasound with gallstones.  Left leg pain: Possibly originating in her back.  Notes her upper thigh anteriorly and laterally hurts.  Notes bending over hurts.  Notes her back hurts.  No numbness, weakness, or bowel or bladder incontinence.  No injury.  Hypertension: Typically 130/60.  Has not taken her medication today.  She takes amlodipine, losartan, and metoprolol.  No chest pain or shortness of breath.  Social History   Tobacco Use  Smoking Status Never Smoker  Smokeless Tobacco Never Used     ROS see history of present illness  Objective  Physical Exam Vitals:   03/31/18 1600  BP: (!) 160/70  Pulse: 78  Temp: 98.1 F (36.7 C)  SpO2: 98%    BP Readings from Last 3 Encounters:  03/31/18 (!) 160/70  11/17/17 137/71  11/10/17 (!) 158/70   Wt Readings from Last 3 Encounters:  03/31/18 224 lb 6.4 oz (101.8 kg)  11/17/17 200 lb (90.7 kg)  11/10/17 216 lb (98 kg)    Physical Exam Constitutional:      General: She is not in acute distress.    Appearance: She is not diaphoretic.  Cardiovascular:     Rate and Rhythm: Normal rate and regular rhythm.     Heart sounds: Normal heart sounds.  Pulmonary:     Effort: Pulmonary effort is normal.     Breath sounds: Normal breath sounds.  Musculoskeletal:     Comments: Chaperone used, no midline spine tenderness, no midline spine step-off, there is left lumbar  muscular back tenderness, there is upper thigh tenderness, no overlying skin changes  Skin:    General: Skin is warm and dry.  Neurological:     Mental Status: She is alert.     Comments: 5/5 strength bilateral hip flexors, quads, hamstrings, plantar flexion, and dorsiflexion, sensation to light touch intact bilateral lower extremities      Assessment/Plan: Please see individual problem list.  Hypertension Elevated today though she did not take her blood pressure medication.  Encouraged her to take this and she will return in 1 week for BP check with nursing.  Epigastric pain Undetermined cause.  History does not sound as though this would be related to cholelithiasis.  Potentially could be gastritis or ulcer related.  No clear indicator for gastric ulcer or duodenal ulcer.  Will refer to GI to consider EGD.  Acute left-sided low back pain with left-sided sciatica Muscular strain versus disc issue with nerve impingement.  Given possible radicular symptoms will obtain an x-ray.  Given exercises to complete at home.  Elevated glucose Check A1c and BMP.   Orders Placed This Encounter  Procedures  . DG Lumbar Spine Complete    Standing Status:   Future    Number of Occurrences:   1    Standing Expiration Date:   05/30/2019    Order Specific Question:   Reason for Exam (SYMPTOM  OR DIAGNOSIS REQUIRED)    Answer:  low back pain radiating to anterior and lateral right thigh    Order Specific Question:   Preferred imaging location?    Answer:   [facility] Specific Question:   Radiology Contrast Protocol - do NOT remove file path    Answer:   \\[email or web address]\[email or web address].pdf  . HgB A1c  . Basic Metabolic Panel (BMET)  . Ambulatory referral to Gastroenterology    Referral Priority:   Routine    Referral Type:   Consultation    Referral Reason:   Specialty Services Required    Number of Visits Requested:   1    No orders of the  defined types were placed in this encounter.    [name], MD [facility]

## 2018-04-01 LAB — BASIC METABOLIC PANEL
BUN: 25 mg/dL — ABNORMAL HIGH (ref 6–23)
CO2: 25 mEq/L (ref 19–32)
Calcium: 9.6 mg/dL (ref 8.4–10.5)
Chloride: 106 mEq/L (ref 96–112)
Creatinine, Ser: 1.1 mg/dL (ref 0.40–1.20)
GFR: 49.36 mL/min — ABNORMAL LOW (ref >60.00)
Glucose, Bld: 89 mg/dL (ref 70–99)
Potassium: 3.9 mEq/L (ref 3.5–5.1)
Sodium: 140 mEq/L (ref 135–145)

## 2018-04-01 LAB — HEMOGLOBIN A1C: HEMOGLOBIN A1C: 5.7 % (ref 4.6–6.5)

## 2018-04-04 ENCOUNTER — Other Ambulatory Visit: Payer: Self-pay | Admitting: Family Medicine

## 2018-04-04 DIAGNOSIS — N179 Acute kidney failure, unspecified: Secondary | ICD-10-CM

## 2018-04-05 NOTE — Progress Notes (Signed)
Patient ID: Audrey Wolf, female    DOB: 09/04/1950, 68 y.o.   MRN: [id number]  HPI  Ms Bieser is a 68 y/o female with a history of asthma, CAD, HTN, CKD, NSTEMI (s/p CABG), GERD and chronic heart failure.  Echo report from 05/22/17 reviewed and showed an EF of 50-55% without any regurgitation.   Cardiac catheterization done 05/18/17 revealed 70% stenosis Ostial LM with recommendation of CABG.   Has not been admitted or been in the ED in the last 6 months.   She presents today for a follow-up visit with a chief complaint of minimal fatigue upon moderate exertion. She describes this as chronic in nature having been present for several years. She has associated difficulty sleeping and slight weight gain along with this. She denies any dizziness, abdominal distention, palpitations, pedal edema, chest pain, shortness of breath or cough. She continues to have left leg pain and may be having an MRI in the future.   Past Medical History:  Diagnosis Date  . Arthritis   . Asthma   . CHF (congestive heart failure) (HCC)   . Chronic kidney disease   . Coronary artery disease   . GERD (gastroesophageal reflux disease)   . Headache   . Hypertension   . Nephrolithiasis   . NSTEMI (non-ST elevated myocardial infarction) [facility])    Past Surgical History:  Procedure Laterality Date  . ABDOMINAL HYSTERECTOMY    . APPENDECTOMY    . BREAST BIOPSY Left 12/31/2010   neg  . CORONARY ARTERY BYPASS GRAFT N/A 05/22/2017   Procedure: CORONARY ARTERY BYPASS GRAFTING (CABG) x 2 WITH ENDOSCOPIC HARVESTING OF RIGHT SAPHENOUS VEIN;  Surgeon: Delight Ovens, MD;  Location: [facility] OR;  Service: Open Heart Surgery;  Laterality: N/A;  . LEFT HEART CATH AND CORONARY ANGIOGRAPHY N/A 05/18/2017   Procedure: LEFT HEART CATH AND CORONARY ANGIOGRAPHY;  Surgeon: Laurier Nancy, MD;  Location: [facility] INVASIVE CV LAB;  Service: Cardiovascular;  Laterality: N/A;  . LITHOTRIPSY    . TEE WITHOUT CARDIOVERSION N/A 05/22/2017    Procedure: TRANSESOPHAGEAL ECHOCARDIOGRAM (TEE);  Surgeon: Delight Ovens, MD;  Location: [facility] OR;  Service: Open Heart Surgery;  Laterality: N/A;  . TONSILLECTOMY     Family History  Problem Relation Age of Onset  . Arthritis Mother   . Arthritis Father   . Heart disease Father   . Stroke Father   . Sudden Cardiac Death Father   . Breast cancer Maternal Aunt    Social History   Tobacco Use  . Smoking status: Never Smoker  . Smokeless tobacco: Never Used  Substance Use Topics  . Alcohol use: No   Allergies  Allergen Reactions  . Lisinopril Cough  . Ace Inhibitors Other (See Comments)    Has been told to avoid these and any diuretics because "left kidney has shrunk up and doesn't work"    Prior to Admission medications   Medication Sig Start Date End Date Taking? Authorizing Provider  acetaminophen (TYLENOL) 500 MG tablet Take 1 tablet (500 mg total) by mouth daily as needed for mild pain or headache. 05/27/17  Yes Doree Fudge M, PA-C  albuterol (PROVENTIL HFA;VENTOLIN HFA) 108 (90 Base) MCG/ACT inhaler Inhale 2 puffs into the lungs every 6 (six) hours as needed for wheezing or shortness of breath. 04/20/17  Yes Glori Luis, MD  amLODipine (NORVASC) 10 MG tablet TAKE 1 TABLET BY MOUTH ONCE DAILY 01/29/18  Yes Clarisa Kindred A, FNP  aspirin EC 325 MG  EC tablet Take 1 tablet (325 mg total) by mouth daily. 05/27/17  Yes Joycelyn Man, Donielle M, PA-C  fluticasone (FLONASE) 50 MCG/ACT nasal spray Place 2 sprays into both nostrils daily. 11/10/17  Yes Glori Luis, MD  loratadine (CLARITIN) 10 MG tablet Take 10 mg by mouth daily.    Yes [provider]  losartan (COZAAR) 50 MG tablet Take 1 tablet (50 mg total) by mouth daily. 09/18/17  Yes Clarisa Kindred A, FNP  metoprolol tartrate (LOPRESSOR) 50 MG tablet Take 1 tablet (50 mg total) by mouth 2 (two) times daily. 07/06/17  Yes Clarisa Kindred A, FNP  rosuvastatin (CRESTOR) 20 MG tablet Take 1 tablet (20 mg total)  by mouth daily. 09/18/17  Yes Delma Freeze, FNP    Review of Systems  Constitutional: Positive for fatigue. Negative for appetite change.  HENT: Negative for congestion, postnasal drip and sore throat.   Eyes: Negative.   Respiratory: Negative for cough, chest tightness and shortness of breath.   Cardiovascular: Negative for chest pain, palpitations and leg swelling.  Gastrointestinal: Negative for abdominal distention and abdominal pain.  Endocrine: Negative.   Genitourinary: Negative.   Musculoskeletal: Positive for arthralgias (left leg pain). Negative for back pain and neck pain.  Skin: Negative.        Healing CABG incision  Allergic/Immunologic: Negative.   Neurological: Negative for dizziness and light-headedness.  Hematological: Negative for adenopathy. Does not bruise/bleed easily.  Psychiatric/Behavioral: Positive for sleep disturbance (sleeping in recliner some since CABG).   Vitals:   04/06/18 1118  BP: (!) 183/69  Pulse: 71  Resp: 18  SpO2: 99%  Weight: 223 lb 8 oz (101.4 kg)  Height: 5\' 9"  (1.753 m)   Wt Readings from Last 3 Encounters:  04/06/18 223 lb 8 oz (101.4 kg)  03/31/18 224 lb 6.4 oz (101.8 kg)  11/17/17 200 lb (90.7 kg)   Lab Results  Component Value Date   CREATININE 1.10 03/31/2018   CREATININE 0.95 11/10/2017   CREATININE 0.96 07/27/2017    Physical Exam Vitals signs and nursing note reviewed.  Constitutional:      Appearance: She is well-developed.  HENT:     Head: Normocephalic and atraumatic.  Neck:     Musculoskeletal: Normal range of motion and neck supple.     Vascular: No JVD.  Cardiovascular:     Rate and Rhythm: Normal rate and regular rhythm.  Pulmonary:     Effort: Pulmonary effort is normal. No respiratory distress.     Breath sounds: No wheezing or rales.  Abdominal:     General: There is no distension.     Palpations: Abdomen is soft.  Musculoskeletal:        General: No tenderness.  Skin:    General: Skin is warm  and dry.     Comments: Healing sternal incision/ no signs of infection  Neurological:     Mental Status: She is alert and oriented to person, place, and time.  Psychiatric:        Behavior: Behavior normal.     Assessment & Plan:  1: Chronic heart failure with preserved ejection fraction- - NYHA class II - euvolemic today - weighing daily. Reminded to call for an overnight weight gain of >2 pounds or a weekly weight gain of >5 pounds - weight up 5 pounds from last visit 6 months ago - not adding salt and trying to keep her daily sodium intake to 2000mg  daily - saw cardiology Welton Flakes)  - BNP on  05/16/17 was 461.0 - she says that she's received her flu vaccine for this season  2: HTN- - BP elevated today - will increase her metoprolol to 100mg  BID. She was instructed that she could take 2 of her 50mg  tablets twice daily until they are gone and then I can call in a new RX for the 100mg  tablet.  - saw PCP Birdie Sons) 07/27/17 - BMP on 03/31/2018 reviewed and showed sodium 140, potassium 3.9, creatinine 1.10  and GFR 49.36  3: NSTEMI- - CABG done 05/22/17  - saw cardiac surgeon 08/27/17 - she works at [employer] and says that she's quite active at work  Patient did not bring her medications nor a list. Each medication was verbally reviewed with the patient and she was encouraged to bring the bottles to every visit to confirm accuracy of list.  Return in 1 month or sooner for any questions/problems before then.

## 2018-04-06 ENCOUNTER — Encounter: Payer: Self-pay | Admitting: Pharmacist

## 2018-04-06 ENCOUNTER — Encounter: Payer: Self-pay | Admitting: *Deleted

## 2018-04-06 ENCOUNTER — Encounter: Payer: Self-pay | Admitting: Family

## 2018-04-06 ENCOUNTER — Ambulatory Visit: Payer: BLUE CROSS/BLUE SHIELD | Attending: Family | Admitting: Family

## 2018-04-06 VITALS — BP 183/69 | HR 71 | Resp 18 | Ht 69.0 in | Wt 223.5 lb

## 2018-04-06 DIAGNOSIS — I214 Non-ST elevation (NSTEMI) myocardial infarction: Secondary | ICD-10-CM

## 2018-04-06 DIAGNOSIS — I5032 Chronic diastolic (congestive) heart failure: Secondary | ICD-10-CM

## 2018-04-06 DIAGNOSIS — Z951 Presence of aortocoronary bypass graft: Secondary | ICD-10-CM | POA: Insufficient documentation

## 2018-04-06 DIAGNOSIS — R5383 Other fatigue: Secondary | ICD-10-CM | POA: Diagnosis present

## 2018-04-06 DIAGNOSIS — I252 Old myocardial infarction: Secondary | ICD-10-CM | POA: Insufficient documentation

## 2018-04-06 DIAGNOSIS — I2510 Atherosclerotic heart disease of native coronary artery without angina pectoris: Secondary | ICD-10-CM | POA: Diagnosis not present

## 2018-04-06 DIAGNOSIS — M1990 Unspecified osteoarthritis, unspecified site: Secondary | ICD-10-CM | POA: Insufficient documentation

## 2018-04-06 DIAGNOSIS — Z7982 Long term (current) use of aspirin: Secondary | ICD-10-CM | POA: Insufficient documentation

## 2018-04-06 DIAGNOSIS — I130 Hypertensive heart and chronic kidney disease with heart failure and stage 1 through stage 4 chronic kidney disease, or unspecified chronic kidney disease: Secondary | ICD-10-CM | POA: Insufficient documentation

## 2018-04-06 DIAGNOSIS — G479 Sleep disorder, unspecified: Secondary | ICD-10-CM | POA: Diagnosis not present

## 2018-04-06 DIAGNOSIS — J45909 Unspecified asthma, uncomplicated: Secondary | ICD-10-CM | POA: Insufficient documentation

## 2018-04-06 DIAGNOSIS — Z79899 Other long term (current) drug therapy: Secondary | ICD-10-CM | POA: Insufficient documentation

## 2018-04-06 DIAGNOSIS — Z7901 Long term (current) use of anticoagulants: Secondary | ICD-10-CM | POA: Insufficient documentation

## 2018-04-06 DIAGNOSIS — N189 Chronic kidney disease, unspecified: Secondary | ICD-10-CM | POA: Diagnosis not present

## 2018-04-06 DIAGNOSIS — I10 Essential (primary) hypertension: Secondary | ICD-10-CM

## 2018-04-06 DIAGNOSIS — Z8249 Family history of ischemic heart disease and other diseases of the circulatory system: Secondary | ICD-10-CM | POA: Diagnosis not present

## 2018-04-06 MED ORDER — METOPROLOL TARTRATE 100 MG PO TABS: 100.0000 mg | ORAL_TABLET | Freq: Two times a day (BID) | ORAL | 3 refills | Status: DC

## 2018-04-06 NOTE — Progress Notes (Signed)
[facility] - HEART FAILURE CLINIC - PHARMACIST COUNSELING NOTE  ADHERENCE ASSESSMENT  Adherence strategy: puts morning medications in an Altoid can to take with her to work. Goes to work at 4 am so she takes her medications at work once she eats. Takes second dose of metoprolol at home.   Do you ever forget to take your medication? [] Yes (1) [x] No (0)  Do you ever skip doses due to side effects? [] Yes (1) [x] No (0)  Do you have trouble affording your medicines? [] Yes (1) [x] No (0)  Are you ever unable to pick up your medication due to transportation difficulties? [] Yes (1) [x] No (0)  Do you ever stop taking your medications because you don't believe they are helping? [] Yes (1) [x] No (0)  Total score _0______    Recommendations given to patient about increasing adherence: Patient reports compliance with current method of remembering  Guideline-Directed Medical Therapy/Evidence Based Medicine    ACE/ARB/ARNI: losartan 50 mg daily   Beta Blocker: metoprolol tartrate 50 mg twice daily   Aldosterone Antagonist: none (HFpEF) Diuretic: none    SUBJECTIVE   HPI: Here for follow up visit. Denies shortness of breath or issues with swelling. Says she sees PCP next week as well as nephrologist this month. She says they think she may be having issues with kidneys.   Past Medical History:  Diagnosis Date  . Arthritis   . Asthma   . CHF (congestive heart failure) (HCC)   . Chronic kidney disease   . Coronary artery disease   . GERD (gastroesophageal reflux disease)   . Headache   . Hypertension   . Nephrolithiasis   . NSTEMI (non-ST elevated myocardial infarction) (HCC)         OBJECTIVE    Vital signs: HR 71, BP 183/69, weight (pounds) 223.8  ECHO: Date 05/22/17, EF 50-55%   BMP Latest Ref Rng & Units 03/31/2018 11/10/2017 07/27/2017  Glucose 70 - 99 mg/dL 89 797(K) 820(U)  BUN 6 - 23 mg/dL 01(V) 20 61(B)  Creatinine 0.40 - 1.20 mg/dL 3.79 4.32 7.61   Sodium 135 - 145 mEq/L 140 140 141  Potassium 3.5 - 5.1 mEq/L 3.9 3.6 4.0  Chloride 96 - 112 mEq/L 106 108 108  CO2 19 - 32 mEq/L 25 24 24   Calcium 8.4 - 10.5 mg/dL 9.6 9.0 9.6    ASSESSMENT 68 year old female with HFpEF. Denies swelling or shortness of breath. Reports good compliance with medications. BP elevated at 183/69 today. She did take her medications this morning. BP elevated at recent PCP visit as well. She was taken off of Zantac. Said PCP thought she might have issues with gallbladder, but now think it could be related to kidneys.   PLAN Will avoid increasing losartan today since patient reports concern for kidney function and she is to see a nephrologist soon. Increase metoprolol tartrate to 100 mg BID and follow up BP.    Time spent: 10 minutes  Abby K Ellington, Pharm.D. 04/06/2018 11:35 AM    Current Outpatient Medications:  .  acetaminophen (TYLENOL) 500 MG tablet, Take 1 tablet (500 mg total) by mouth daily as needed for mild pain or headache., Disp: 30 tablet, Rfl: 0 .  albuterol (PROVENTIL HFA;VENTOLIN HFA) 108 (90 Base) MCG/ACT inhaler, Inhale 2 puffs into the lungs every 6 (six) hours as needed for wheezing or shortness of breath., Disp: 1 Inhaler, Rfl: 0 .  amLODipine (NORVASC) 10 MG tablet, TAKE 1 TABLET BY MOUTH ONCE DAILY, Disp:  90 tablet, Rfl: 3 .  aspirin EC 325 MG EC tablet, Take 1 tablet (325 mg total) by mouth daily., Disp: 30 tablet, Rfl: 0 .  fluticasone (FLONASE) 50 MCG/ACT nasal spray, Place 2 sprays into both nostrils daily., Disp: 16 g, Rfl: 6 .  loratadine (CLARITIN) 10 MG tablet, Take 10 mg by mouth daily. , Disp: , Rfl:  .  losartan (COZAAR) 50 MG tablet, Take 1 tablet (50 mg total) by mouth daily., Disp: 90 tablet, Rfl: 3 .  metoprolol tartrate (LOPRESSOR) 50 MG tablet, Take 1 tablet (50 mg total) by mouth 2 (two) times daily., Disp: 180 tablet, Rfl: 3 .  rosuvastatin (CRESTOR) 20 MG tablet, Take 1 tablet (20 mg total) by mouth daily., Disp: 90  tablet, Rfl: 3   COUNSELING POINTS/CLINICAL PEARLS  Losartan (Goal: 150 mg once daily)  Warn female patient to avoid pregnancy and to report a pregnancy that occurs during therapy.  Side effects may include dizziness, upper respiratory infection, nasal congestion, and back pain.  Warn patient to avoid use of potassium supplements or potassium-containing salt substitutes unless they consult healthcare provider.   DRUGS TO AVOID IN HEART FAILURE  Drug or Class Mechanism  Analgesics . NSAIDs . COX-2 inhibitors . Glucocorticoids  Sodium and water retention, increased systemic vascular resistance, decreased response to diuretics   Diabetes Medications . Metformin . Thiazolidinediones o Rosiglitazone (Avandia) o Pioglitazone (Actos) . DPP4 Inhibitors o Saxagliptin (Onglyza) o Sitagliptin (Januvia)   Lactic acidosis Possible calcium channel blockade   Unknown  Antiarrhythmics . Class I  o Flecainide o Disopyramide . Class III o Sotalol . Other o Dronedarone  Negative inotrope, proarrhythmic   Proarrhythmic, beta blockade  Negative inotrope  Antihypertensives . Alpha Blockers o Doxazosin . Calcium Channel Blockers o Diltiazem o Verapamil o Nifedipine . Central Alpha Adrenergics o Moxonidine . Peripheral Vasodilators o Minoxidil  Increases renin and aldosterone  Negative inotrope    Possible sympathetic withdrawal  Unknown  Anti-infective . Itraconazole . Amphotericin B  Negative inotrope Unknown  Hematologic . Anagrelide . Cilostazol   Possible inhibition of PD IV Inhibition of PD III causing arrhythmias  Neurologic/Psychiatric . Stimulants . Anti-Seizure Drugs o Carbamazepine o Pregabalin . Antidepressants o Tricyclics o Citalopram . Parkinsons o Bromocriptine o Pergolide o Pramipexole . Antipsychotics o Clozapine . Antimigraine o Ergotamine o Methysergide . Appetite suppressants . Bipolar o Lithium  Peripheral alpha and beta  agonist activity  Negative inotrope and chronotrope Calcium channel blockade  Negative inotrope, proarrhythmic Dose-dependent QT prolongation  Excessive serotonin activity/valvular damage Excessive serotonin activity/valvular damage Unknown  IgE mediated hypersensitivy, calcium channel blockade  Excessive serotonin activity/valvular damage Excessive serotonin activity/valvular damage Valvular damage  Direct myofibrillar degeneration, adrenergic stimulation  Antimalarials . Chloroquine . Hydroxychloroquine Intracellular inhibition of lysosomal enzymes  Urologic Agents . Alpha Blockers o Doxazosin o Prazosin o Tamsulosin o Terazosin  Increased renin and aldosterone  Adapted from Page RL, et al. "Drugs That May Cause or Exacerbate Heart Failure: A Scientific Statement from the American Heart  Association." Circulation 2016; 134:e32-e69. DOI: 10.1161/CIR.0000000000000426   MEDICATION ADHERENCES TIPS AND STRATEGIES 1. Taking medication as prescribed improves patient outcomes in heart failure (reduces hospitalizations, improves symptoms, increases survival) 2. Side effects of medications can be managed by decreasing doses, switching agents, stopping drugs, or adding additional therapy. Please let someone in the Heart Failure Clinic know if you have having bothersome side effects so we can modify your regimen. Do not alter your medication regimen without talking to [place].  3. Medication reminders can help patients remember to take drugs on time. If you are missing or forgetting doses you can try linking behaviors, using pill boxes, or an electronic reminder like an alarm on your phone or an app. Some people can also get automated phone calls as medication reminders.

## 2018-04-06 NOTE — Patient Instructions (Addendum)
Continue weighing daily and call for an overnight weight gain of > 2 pounds or a weekly weight gain of >5 pounds.  Increase metoprolol tartrate to 100mg  twice daily. You can finish your current bottle of 50mg  tablets by taking 2 tablets in the morning and 2 tablets in the evening until they are gone.

## 2018-04-11 ENCOUNTER — Other Ambulatory Visit: Payer: Self-pay | Admitting: Family Medicine

## 2018-04-11 ENCOUNTER — Telehealth: Payer: Self-pay | Admitting: Family Medicine

## 2018-04-11 DIAGNOSIS — M5416 Radiculopathy, lumbar region: Secondary | ICD-10-CM

## 2018-04-11 NOTE — Telephone Encounter (Signed)
Please contact the patient and let her know GI has been trying to contact her to schedule an appointment. Please give her the contact number for [facility] so she can call to arrange an appointment. Thanks.

## 2018-04-12 NOTE — Telephone Encounter (Signed)
Called and spoke with patient. Pt advised and voiced understanding. Gave pt the phone number to call @ [phone].

## 2018-04-13 ENCOUNTER — Ambulatory Visit (INDEPENDENT_AMBULATORY_CARE_PROVIDER_SITE_OTHER): Payer: BLUE CROSS/BLUE SHIELD

## 2018-04-13 VITALS — BP 130/62

## 2018-04-13 DIAGNOSIS — I10 Essential (primary) hypertension: Secondary | ICD-10-CM

## 2018-04-13 NOTE — Progress Notes (Signed)
Patient came in today for blood pressure check. BP was checked in left arm with large cuff. Reading was 130/62 after taking her losartan 50 mg & metoprolol 100 mg around 8 am this morning.

## 2018-04-14 ENCOUNTER — Ambulatory Visit: Payer: Self-pay

## 2018-04-22 NOTE — Progress Notes (Signed)
LMTCB

## 2018-04-22 NOTE — Progress Notes (Signed)
I spoke with patient & she stated that she is checking at home daily. BP's remain consistent in the 130's/60's range. Patient will call back if the trend up.

## 2018-05-04 ENCOUNTER — Ambulatory Visit: Payer: BLUE CROSS/BLUE SHIELD | Attending: Family | Admitting: Family

## 2018-05-04 ENCOUNTER — Telehealth: Payer: Self-pay

## 2018-05-04 ENCOUNTER — Other Ambulatory Visit: Payer: Self-pay

## 2018-05-04 ENCOUNTER — Encounter: Payer: Self-pay | Admitting: Family

## 2018-05-04 VITALS — BP 160/70 | Wt 220.0 lb

## 2018-05-04 DIAGNOSIS — I5032 Chronic diastolic (congestive) heart failure: Secondary | ICD-10-CM

## 2018-05-04 DIAGNOSIS — I10 Essential (primary) hypertension: Secondary | ICD-10-CM

## 2018-05-04 MED ORDER — METOPROLOL TARTRATE 100 MG PO TABS: 100.0000 mg | ORAL_TABLET | Freq: Two times a day (BID) | ORAL | 3 refills | Status: DC

## 2018-05-04 NOTE — Patient Instructions (Signed)
Continue weighing daily and call for an overnight weight gain of > 2 pounds or a weekly weight gain of >5 pounds. 

## 2018-05-04 NOTE — Progress Notes (Signed)
Virtual Visit via Telephone Note     Evaluation Performed:  Follow-up visit  This visit type was conducted due to national recommendations for restrictions regarding the COVID-19 Pandemic (e.g. social distancing).  This format is felt to be most appropriate for this patient at this time.  All issues noted in this document were discussed and addressed.  No physical exam was performed (except for noted visual exam findings with Video Visits).  Please refer to the patient's chart (MyChart message for video visits and phone note for telephone visits) for the patient's consent to telehealth for [facility]  Date:  05/04/2018   ID:  Audrey BimlerKatherine R Wentland, DOB 11-24-1950, MRN [id number]  Patient Location:  [address] [place] [id number]   Provider location:   [facility] [address] [place], [place] [address]  PCP:  Glori LuisSonnenberg, Eric G, MD  Cardiologist:  Adrian BlackwaterKhan, Shaukat, MD Electrophysiologist:  None   Chief Complaint:  Moderate fatigue  History of Present Illness:    Audrey Wolf is a 68 y.o. female who presents via audio/video conferencing for a telehealth visit today.  The patient does not have symptoms concerning for COVID-19 infection (fever, chills, cough, or new SHORTNESS OF BREATH).   Patient reports a moderate amount of shortness of breath upon minimal exertion. She says that this has been present for several years. She has associated shortness of breath along with this. She denies any weight gain, difficulty sleeping, fever, sore throat, cough, wheezing, chest pain, palpitations, edema in her legs/ abdomen or dizziness. Has been working ~ 60 hours / week at [employer].  Prior CV studies:   The following studies were reviewed today:  Echo report from 05/16/17 showed an EF of 50% along with mild MR. TEE done 05/22/17 showed normal EF and no patent foramen ovale present.   Past Medical History:  Diagnosis Date  . Arthritis   . Asthma   .  CHF (congestive heart failure) (HCC)   . Chronic kidney disease   . Coronary artery disease   . GERD (gastroesophageal reflux disease)   . Headache   . Hypertension   . Nephrolithiasis   . NSTEMI (non-ST elevated myocardial infarction) [facility](HCC)    Past Surgical History:  Procedure Laterality Date  . ABDOMINAL HYSTERECTOMY    . APPENDECTOMY    . BREAST BIOPSY Left 12/31/2010   neg  . CORONARY ARTERY BYPASS GRAFT N/A 05/22/2017   Procedure: CORONARY ARTERY BYPASS GRAFTING (CABG) x 2 WITH ENDOSCOPIC HARVESTING OF RIGHT SAPHENOUS VEIN;  Surgeon: Delight OvensGerhardt, Edward B, MD;  [facility];  Service: Open Heart Surgery;  Laterality: N/A;  . LEFT HEART CATH AND CORONARY ANGIOGRAPHY N/A 05/18/2017   Procedure: LEFT HEART CATH AND CORONARY ANGIOGRAPHY;  Surgeon: Laurier NancyKhan, Shaukat A, MD;  Location: [facility] INVASIVE CV LAB;  Service: Cardiovascular;  Laterality: N/A;  . LITHOTRIPSY    . TEE WITHOUT CARDIOVERSION N/A 05/22/2017   Procedure: TRANSESOPHAGEAL ECHOCARDIOGRAM (TEE);  Surgeon: Delight OvensGerhardt, Edward B, MD;  [facility];  Service: Open Heart Surgery;  Laterality: N/A;  . TONSILLECTOMY       Current Meds  Medication Sig  . acetaminophen (TYLENOL) 500 MG tablet Take 1 tablet (500 mg total) by mouth daily as needed for mild pain or headache.  . albuterol (PROVENTIL HFA;VENTOLIN HFA) 108 (90 Base) MCG/ACT inhaler Inhale 2 puffs into the lungs every 6 (six) hours as needed for wheezing or shortness of breath.  Marland Kitchen. amLODipine (  NORVASC) 10 MG tablet TAKE 1 TABLET BY MOUTH ONCE DAILY  . aspirin EC 325 MG EC tablet Take 1 tablet (325 mg total) by mouth daily.  . fluticasone (FLONASE) 50 MCG/ACT nasal spray Place 2 sprays into both nostrils daily.  Marland Kitchen loratadine (CLARITIN) 10 MG tablet Take 10 mg by mouth daily.   Marland Kitchen losartan (COZAAR) 50 MG tablet Take 1 tablet (50 mg total) by mouth daily.  . metoprolol tartrate (LOPRESSOR) 100 MG tablet Take 1 tablet (100 mg total) by mouth 2 (two) times daily.  . Multiple  Vitamins-Minerals (PRESERVISION AREDS 2) CAPS Take 2 capsules by mouth daily.  . rosuvastatin (CRESTOR) 20 MG tablet Take 1 tablet (20 mg total) by mouth daily.  . [DISCONTINUED] metoprolol tartrate (LOPRESSOR) 100 MG tablet Take 1 tablet (100 mg total) by mouth 2 (two) times daily.     Allergies:   Lisinopril and Ace inhibitors   Social History   Tobacco Use  . Smoking status: Never Smoker  . Smokeless tobacco: Never Used  Substance Use Topics  . Alcohol use: No  . Drug use: No     Family Hx: The patient's family history includes Arthritis in her father and mother; Breast cancer in her maternal aunt; Heart disease in her father; Stroke in her father; Sudden Cardiac Death in her father.  ROS:   Please see the history of present illness.     All other systems reviewed and are negative.   Labs/Other Tests and Data Reviewed:    Recent Labs: 05/16/2017: B Natriuretic Peptide 461.0 05/23/2017: Magnesium 2.4 11/10/2017: ALT 22; Hemoglobin 12.5; Platelets 234.0 03/31/2018: BUN 25; Creatinine, Ser 1.10; Potassium 3.9; Sodium 140   Recent Lipid Panel Lab Results  Component Value Date/Time   CHOL 94 11/10/2017 09:30 AM   CHOL 153 03/01/2012 11:32 AM   TRIG 102.0 11/10/2017 09:30 AM   TRIG 127 03/01/2012 11:32 AM   HDL 32.90 (L) 11/10/2017 09:30 AM   HDL 27 (L) 03/01/2012 11:32 AM   CHOLHDL 3 11/10/2017 09:30 AM   LDLCALC 41 11/10/2017 09:30 AM   LDLCALC 101 (H) 03/01/2012 11:32 AM    Wt Readings from Last 3 Encounters:  05/04/18 220 lb (99.8 kg)  04/06/18 223 lb 8 oz (101.4 kg)  03/31/18 224 lb 6.4 oz (101.8 kg)     Exam:    Vital Signs:  BP (!) 160/70 Comment: self-reported  Wt 220 lb (99.8 kg) Comment: self-reported  BMI 32.49 kg/m    Well nourished, well developed female in no  acute distress.   ASSESSMENT & PLAN:    1.  Chronic heart failure with preserved ejection fraction- - NYHA class III - euvolemic per patient's description - weighing daily and says  that her home weight ranges from 218-220 pounds. Reminded to call for an overnight weight gain of >2 pounds or a weekly weight gain of >5 pounds - does add salt but "just a bit" to her foods and she says that she's cut way back with her sodium intake. Encouraged her to not add any salt to her food.  - saw cardiology Welton Flakes) end of 2019 and says that she has an upcoming appointment with him - new RX of metoprolol 100mg  BID sent in today  2: HTN- - self-reported BP was elevated today at 160/70 but she says that was taken prior to taking her medications and she hasn't checked it since - that BP is better than last BP in the clinic and metoprolol was increased at  that time to 100mg  BID - saw PCP Birdie Sons) 03/31/2018 - BMP from 03/31/2018 reviewed and showed sodium 140, potassium 3.9, creatinine 1.10 and GFR 49.36  COVID-19 Education: The signs and symptoms of COVID-19 were discussed with the patient and how to seek care for testing (follow up with PCP or arrange E-visit).  The importance of social distancing was discussed today.  Patient Risk:   After full review of this patients clinical status, I feel that they are at least moderate risk at this time.  Time:   Today, I have spent with the patient with telehealth technology discussing medications, daily weight and symptoms to call about.     Medication Adjustments/Labs and Tests Ordered: Current medicines are reviewed at length with the patient today.  Concerns regarding medicines are outlined above.   Tests Ordered: No orders of the defined types were placed in this encounter.  Medication Changes: Meds ordered this encounter  Medications  . metoprolol tartrate (LOPRESSOR) 100 MG tablet    Sig: Take 1 tablet (100 mg total) by mouth 2 (two) times daily.    Dispense:  180 tablet    Refill:  3    Disposition:  Follow-up in 4 months or sooner for any questions/problems before then.   Signed, Delma Freeze, FNP  05/04/2018  11:09 AM    [facility]

## 2018-05-04 NOTE — Telephone Encounter (Signed)
   TELEPHONE CALL NOTE  This patient has been deemed a candidate for follow-up tele-health visit to limit community exposure during the Covid-19 pandemic. I spoke with the patient via phone to discuss instructions. The patient was advised to review the section on consent for treatment as well. The patient will receive a phone call 2-3 days prior to their E-Visit at which time consent will be verbally confirmed. A Virtual Office Visit appointment type has been scheduled for 05/04/2018 with Clarisa Kindred FNP.  [name], CMA 05/04/2018 8:55 AM

## 2018-05-04 NOTE — Telephone Encounter (Signed)
TELEPHONE CALL NOTE  Audrey Wolf has been deemed a candidate for a follow-up tele-health visit to limit community exposure during the Covid-19 pandemic. I spoke with the patient via phone to ensure availability of phone/video source, confirm preferred email & phone number, discuss instructions and expectations, and review consent.   I reminded Audrey Wolf to be prepared with any vital sign and/or heart rhythm information that could potentially be obtained via home monitoring, at the time of her visit.  Finally, I reminded Audrey Wolf to expect an e-mail containing a link for their video-based visit approximately 15 minutes before her visit, or alternatively, a phone call at the time of her visit if her visit is planned to be a phone encounter.  Did the patient verbally consent to treatment as below? YES  Audrey Wolf, CMA 05/04/2018 8:56 AM  CONSENT FOR TELE-HEALTH VISIT - PLEASE REVIEW  I hereby voluntarily request, consent and authorize [facility] and its employed or contracted physicians, physician assistants, nurse practitioners or other licensed health care professionals (the Practitioner), to provide me with telemedicine health care services (the "Services") as deemed necessary by the treating Practitioner. I acknowledge and consent to receive the Services by the Practitioner via telemedicine. I understand that the telemedicine visit will involve communicating with the Practitioner through telephonic communication technology and the disclosure of certain medical information by electronic transmission. I acknowledge that I have been given the opportunity to request an in-person assessment or other available alternative prior to the telemedicine visit and am voluntarily participating in the telemedicine visit.  I understand that I have the right to withhold or withdraw my consent to the use of telemedicine in the course of my care at any time, without  affecting my right to future care or treatment, and that the Practitioner or I may terminate the telemedicine visit at any time. I understand that I have the right to inspect all information obtained and/or recorded in the course of the telemedicine visit and may receive copies of available information for a reasonable fee.  I understand that some of the potential risks of receiving the Services via telemedicine include:  Marland Kitchen Delay or interruption in medical evaluation due to technological equipment failure or disruption; . Information transmitted may not be sufficient (e.g. poor resolution of images) to allow for appropriate medical decision making by the Practitioner; and/or  . In rare instances, security protocols could fail, causing a breach of personal health information.  Furthermore, I acknowledge that it is my responsibility to provide information about my medical history, conditions and care that is complete and accurate to the best of my ability. I acknowledge that Practitioner's advice, recommendations, and/or decision may be based on factors not within their control, such as incomplete or inaccurate data provided by me or lack of visual representation. I understand that the practice of medicine is not an exact science and that Practitioner makes no warranties or guarantees regarding treatment outcomes. I acknowledge that I will receive a copy of this consent concurrently upon execution via email to the email address I last provided but may also request a printed copy by calling the office of [facility].    I understand that my insurance may be billed for this visit.   I have read or had this consent read to me. . I understand the contents of this consent, which adequately explains the benefits and risks of the Services being provided via telemedicine.  Marland Kitchen  I have been provided ample opportunity to ask questions regarding this consent and the Services and have had my questions  answered to my satisfaction. . I give my informed consent for the services to be provided through the use of telemedicine in my medical care  By participating in this telemedicine visit I agree to the above.

## 2018-05-19 ENCOUNTER — Other Ambulatory Visit: Payer: Self-pay

## 2018-05-28 DIAGNOSIS — N183 Chronic kidney disease, stage 3 (moderate): Secondary | ICD-10-CM | POA: Diagnosis not present

## 2018-05-28 DIAGNOSIS — N1330 Unspecified hydronephrosis: Secondary | ICD-10-CM | POA: Diagnosis not present

## 2018-05-28 DIAGNOSIS — I10 Essential (primary) hypertension: Secondary | ICD-10-CM | POA: Diagnosis not present

## 2018-05-28 DIAGNOSIS — E559 Vitamin D deficiency, unspecified: Secondary | ICD-10-CM | POA: Diagnosis not present

## 2018-05-29 ENCOUNTER — Other Ambulatory Visit: Payer: Self-pay | Admitting: Family

## 2018-06-22 ENCOUNTER — Ambulatory Visit: Payer: Medicare Other | Admitting: Urology

## 2018-07-05 ENCOUNTER — Other Ambulatory Visit: Payer: Self-pay

## 2018-07-05 ENCOUNTER — Ambulatory Visit (INDEPENDENT_AMBULATORY_CARE_PROVIDER_SITE_OTHER): Payer: BC Managed Care – PPO | Admitting: Family Medicine

## 2018-07-05 ENCOUNTER — Encounter: Payer: Self-pay | Admitting: Family Medicine

## 2018-07-05 ENCOUNTER — Telehealth: Payer: Self-pay | Admitting: Family Medicine

## 2018-07-05 DIAGNOSIS — R1013 Epigastric pain: Secondary | ICD-10-CM | POA: Diagnosis not present

## 2018-07-05 DIAGNOSIS — I10 Essential (primary) hypertension: Secondary | ICD-10-CM

## 2018-07-05 DIAGNOSIS — J45909 Unspecified asthma, uncomplicated: Secondary | ICD-10-CM | POA: Insufficient documentation

## 2018-07-05 DIAGNOSIS — R911 Solitary pulmonary nodule: Secondary | ICD-10-CM

## 2018-07-05 DIAGNOSIS — J4520 Mild intermittent asthma, uncomplicated: Secondary | ICD-10-CM | POA: Diagnosis not present

## 2018-07-05 DIAGNOSIS — E785 Hyperlipidemia, unspecified: Secondary | ICD-10-CM

## 2018-07-05 NOTE — Telephone Encounter (Signed)
Please contact the patient.  She needs to be scheduled for lab work and a nurse BP check later this week.  Orders have been placed.  Please also get her scheduled for follow-up with me in 3 months.  Thanks.

## 2018-07-05 NOTE — Assessment & Plan Note (Signed)
Continue Crestor 

## 2018-07-05 NOTE — Progress Notes (Signed)
Virtual Visit via video Note  This visit type was conducted due to national recommendations for restrictions regarding the COVID-19 pandemic (e.g. social distancing).  This format is felt to be most appropriate for this patient at this time.  All issues noted in this document were discussed and addressed.  No physical exam was performed (except for noted visual exam findings with Video Visits).   I connected with Audrey Wolf today at  2:45 PM EDT by a video enabled telemedicine application and verified that I am speaking with the correct person using two identifiers. Location patient: home Location provider: work Persons participating in the virtual visit: patient, provider  I discussed the limitations, risks, security and privacy concerns of performing an evaluation and management service by telephone and the availability of in person appointments. I also discussed with the patient that there may be a patient responsible charge related to this service. The patient expressed understanding and agreed to proceed.  Reason for visit: follow-up  HPI: HYPERTENSION  Disease Monitoring  Home BP Monitoring 160's-170s/60-80 Chest pain- no    Dyspnea- no Medications  Compliance-  Taking amlodipine, losaratan, metoprolol.  Edema- no  HYPERLIPIDEMIA Symptoms Chest pain on exertion:  no   Medications: Compliance- taking crestor Right upper quadrant pain-epigastric pain improved from previously  Muscle aches- no  Epigastric pain: She notes this has improved though has not completely resolved.  Now it seems to happen before she eats.  No nausea, vomiting, diarrhea, or blood in her stool.  She did have an ultrasound that revealed gallstones.  Asthma: Patient reports history of this.  She does occasionally have to use her inhaler particularly if it is quite cool outside or quite warm.  She last used this several weeks ago.  No cough or wheezing.    ROS: See pertinent positives and negatives  per HPI.  Past Medical History:  Diagnosis Date  . Arthritis   . Asthma   . CHF (congestive heart failure) (HCC)   . Chronic kidney disease   . Coronary artery disease   . GERD (gastroesophageal reflux disease)   . Headache   . Hypertension   . Nephrolithiasis   . NSTEMI (non-ST elevated myocardial infarction) [facility](HCC)     Past Surgical History:  Procedure Laterality Date  . ABDOMINAL HYSTERECTOMY    . APPENDECTOMY    . BREAST BIOPSY Left 12/31/2010   neg  . CORONARY ARTERY BYPASS GRAFT N/A 05/22/2017   Procedure: CORONARY ARTERY BYPASS GRAFTING (CABG) x 2 WITH ENDOSCOPIC HARVESTING OF RIGHT SAPHENOUS VEIN;  Surgeon: Delight OvensGerhardt, Edward B, MD;  [facility];  Service: Open Heart Surgery;  Laterality: N/A;  . LEFT HEART CATH AND CORONARY ANGIOGRAPHY N/A 05/18/2017   Procedure: LEFT HEART CATH AND CORONARY ANGIOGRAPHY;  Surgeon: Laurier NancyKhan, Shaukat A, MD;  Location: [facility] INVASIVE CV LAB;  Service: Cardiovascular;  Laterality: N/A;  . LITHOTRIPSY    . TEE WITHOUT CARDIOVERSION N/A 05/22/2017   Procedure: TRANSESOPHAGEAL ECHOCARDIOGRAM (TEE);  Surgeon: Delight OvensGerhardt, Edward B, MD;  [facility];  Service: Open Heart Surgery;  Laterality: N/A;  . TONSILLECTOMY      Family History  Problem Relation Age of Onset  . Arthritis Mother   . Arthritis Father   . Heart disease Father   . Stroke Father   . Sudden Cardiac Death Father   . Breast cancer Maternal Aunt     SOCIAL HX: Non-smoker   Current Outpatient Medications:  .  acetaminophen (TYLENOL) 500 MG tablet, Take 1 tablet (  500 mg total) by mouth daily as needed for mild pain or headache., Disp: 30 tablet, Rfl: 0 .  albuterol (PROVENTIL HFA;VENTOLIN HFA) 108 (90 Base) MCG/ACT inhaler, Inhale 2 puffs into the lungs every 6 (six) hours as needed for wheezing or shortness of breath., Disp: 1 Inhaler, Rfl: 0 .  amLODipine (NORVASC) 10 MG tablet, Take 1 tablet by mouth once daily, Disp: 90 tablet, Rfl: 3 .  aspirin EC 325 MG EC tablet, Take 1  tablet (325 mg total) by mouth daily., Disp: 30 tablet, Rfl: 0 .  fluticasone (FLONASE) 50 MCG/ACT nasal spray, Place 2 sprays into both nostrils daily., Disp: 16 g, Rfl: 6 .  loratadine (CLARITIN) 10 MG tablet, Take 10 mg by mouth daily. , Disp: , Rfl:  .  losartan (COZAAR) 50 MG tablet, Take 1 tablet (50 mg total) by mouth daily., Disp: 90 tablet, Rfl: 3 .  metoprolol tartrate (LOPRESSOR) 100 MG tablet, Take 1 tablet (100 mg total) by mouth 2 (two) times daily., Disp: 180 tablet, Rfl: 3 .  Multiple Vitamins-Minerals (PRESERVISION AREDS 2) CAPS, Take 2 capsules by mouth daily., Disp: , Rfl:  .  rosuvastatin (CRESTOR) 20 MG tablet, Take 1 tablet (20 mg total) by mouth daily., Disp: 90 tablet, Rfl: 3  EXAM:  VITALS per patient if applicable: None.  GENERAL: alert, oriented, appears well and in no acute distress  HEENT: atraumatic, conjunttiva clear, no obvious abnormalities on inspection of external nose and ears  NECK: normal movements of the head and neck  LUNGS: on inspection no signs of respiratory distress, breathing rate appears normal, no obvious gross SOB, gasping or wheezing  CV: no obvious cyanosis  MS: moves all visible extremities without noticeable abnormality  PSYCH/NEURO: pleasant and cooperative, no obvious depression or anxiety, speech and thought processing grossly intact  ASSESSMENT AND PLAN:  Discussed the following assessment and plan:  Hyperlipidemia, unspecified hyperlipidemia type  Incidental lung nodule, > 85mm and < 30mm  Essential hypertension  Epigastric pain  Mild intermittent asthma, unspecified whether complicated  Hyperlipidemia Continue Crestor.  Incidental lung nodule, > 30mm and < 7mm Resolved on recent CT scan.  Hypertension Uncontrolled.  We will have her into the office for lab work and a nurse BP check this week.  She will continue her current regimen once her lab work and blood pressures have been checked in the office will determine  changes to medications.  Epigastric pain Undetermined cause.  Potentially could be gastritis versus cholelithiasis.  Discussed referral to surgery.  Previously referred to GI.  She wants to defer these things given it has improved.  She would like to revisit this once COVID-19 pandemic has improved to a certain degree.  Asthma Rare use of albuterol inhaler.  She will monitor and if worsens let us know.  CMA will contact the patient to get her scheduled for lab work and a nurse BP check later this week and follow-up with me in 3 months.  Social distancing precautions and sick precautions given regarding COVID-19.   I discussed the assessment and treatment plan with the patient. The patient was provided an opportunity to ask questions and all were answered. The patient agreed with the plan and demonstrated an understanding of the instructions.   The patient was advised to call back or seek an in-person evaluation if the symptoms worsen or if the condition fails to improve as anticipated.   [name], MD

## 2018-07-05 NOTE — Assessment & Plan Note (Signed)
Uncontrolled.  We will have her into the office for lab work and a nurse BP check this week.  She will continue her current regimen once her lab work and blood pressures have been checked in the office will determine changes to medications.

## 2018-07-05 NOTE — Assessment & Plan Note (Signed)
Rare use of albuterol inhaler.  She will monitor and if worsens let us know.

## 2018-07-05 NOTE — Assessment & Plan Note (Signed)
Resolved on recent CT scan.

## 2018-07-05 NOTE — Assessment & Plan Note (Signed)
Undetermined cause.  Potentially could be gastritis versus cholelithiasis.  Discussed referral to surgery.  Previously referred to GI.  She wants to defer these things given it has improved.  She would like to revisit this once COVID-19 pandemic has improved to a certain degree.

## 2018-07-07 NOTE — Telephone Encounter (Signed)
Lm on voicemail informing the patient to call back and schedule a lab appt and a nurse visit to check her BP., and a 3 month f/up with the provider.  Nina,cma

## 2018-07-13 ENCOUNTER — Other Ambulatory Visit (INDEPENDENT_AMBULATORY_CARE_PROVIDER_SITE_OTHER): Payer: BC Managed Care – PPO

## 2018-07-13 ENCOUNTER — Ambulatory Visit (INDEPENDENT_AMBULATORY_CARE_PROVIDER_SITE_OTHER): Payer: BC Managed Care – PPO

## 2018-07-13 ENCOUNTER — Other Ambulatory Visit: Payer: Self-pay

## 2018-07-13 VITALS — BP 152/70

## 2018-07-13 DIAGNOSIS — I10 Essential (primary) hypertension: Secondary | ICD-10-CM

## 2018-07-13 NOTE — Progress Notes (Signed)
Patient came in today for BP check done in left arm with large cuff. BP read 152/70. Patient was experiencing no chest pain, HA, arm pain or dizziness. Pt stated that she felt fine.

## 2018-07-14 LAB — BASIC METABOLIC PANEL
BUN: 26 mg/dL — ABNORMAL HIGH (ref 6–23)
CO2: 24 mEq/L (ref 19–32)
Calcium: 9 mg/dL (ref 8.4–10.5)
Chloride: 108 mEq/L (ref 96–112)
Creatinine, Ser: 0.97 mg/dL (ref 0.40–1.20)
GFR: 57.02 mL/min — ABNORMAL LOW (ref >60.00)
Glucose, Bld: 105 mg/dL — ABNORMAL HIGH (ref 70–99)
Potassium: 3.9 mEq/L (ref 3.5–5.1)
Sodium: 141 mEq/L (ref 135–145)

## 2018-07-15 NOTE — Progress Notes (Signed)
Patient blood pressure is uncontrolled.  I would like to increase her losartan.  Please contact her and let her know this.  Once you speak with her I can send the higher dose in.  She would need lab work 7 days after starting the new dose.

## 2018-07-16 NOTE — Progress Notes (Signed)
Patient is agreeable to increase in losartan. She has lab appointment for 6/22.

## 2018-07-22 ENCOUNTER — Other Ambulatory Visit: Payer: Self-pay | Admitting: Family Medicine

## 2018-07-22 DIAGNOSIS — N183 Chronic kidney disease, stage 3 unspecified: Secondary | ICD-10-CM

## 2018-07-25 MED ORDER — LOSARTAN POTASSIUM 100 MG PO TABS: 100.0000 mg | ORAL_TABLET | Freq: Every day | ORAL | 1 refills | Status: DC

## 2018-07-25 NOTE — Progress Notes (Addendum)
New dose of losartan sent to pharmacy.  The patient does not need labs on 07/26/2018.  Please inform her that the new dose of losartan has been sent to her pharmacy and get her labs rescheduled for 1 week after she starts the new dose.  Thanks.

## 2018-07-25 NOTE — Addendum Note (Signed)
Addended by: Leone Haven on: 07/25/2018 05:54 PM   Modules accepted: Orders

## 2018-07-26 ENCOUNTER — Other Ambulatory Visit: Payer: BC Managed Care – PPO

## 2018-07-26 NOTE — Progress Notes (Signed)
Patient scheduled for labs 6/29.

## 2018-08-02 ENCOUNTER — Other Ambulatory Visit (INDEPENDENT_AMBULATORY_CARE_PROVIDER_SITE_OTHER): Payer: BC Managed Care – PPO

## 2018-08-02 ENCOUNTER — Other Ambulatory Visit: Payer: Self-pay

## 2018-08-02 DIAGNOSIS — I10 Essential (primary) hypertension: Secondary | ICD-10-CM | POA: Diagnosis not present

## 2018-08-02 DIAGNOSIS — N183 Chronic kidney disease, stage 3 unspecified: Secondary | ICD-10-CM

## 2018-08-02 LAB — POCT URINALYSIS DIPSTICK
Blood, UA: NEGATIVE
Glucose, UA: NEGATIVE
Leukocytes, UA: NEGATIVE
Nitrite, UA: NEGATIVE
Protein, UA: POSITIVE — AB
Spec Grav, UA: >=1.03 — AB (ref 1.010–1.025)
Urobilinogen, UA: 1 E.U./dL
pH, UA: 5.5 (ref 5.0–8.0)

## 2018-08-02 LAB — BASIC METABOLIC PANEL
BUN: 26 mg/dL — ABNORMAL HIGH (ref 6–23)
CO2: 23 mEq/L (ref 19–32)
Calcium: 8.9 mg/dL (ref 8.4–10.5)
Chloride: 109 mEq/L (ref 96–112)
Creatinine, Ser: 1.19 mg/dL (ref 0.40–1.20)
GFR: 45.03 mL/min — ABNORMAL LOW (ref >60.00)
Glucose, Bld: 91 mg/dL (ref 70–99)
Potassium: 4.4 mEq/L (ref 3.5–5.1)
Sodium: 142 mEq/L (ref 135–145)

## 2018-08-06 ENCOUNTER — Other Ambulatory Visit: Payer: Self-pay | Admitting: Family Medicine

## 2018-08-06 DIAGNOSIS — R809 Proteinuria, unspecified: Secondary | ICD-10-CM

## 2018-08-06 DIAGNOSIS — N183 Chronic kidney disease, stage 3 unspecified: Secondary | ICD-10-CM

## 2018-08-10 ENCOUNTER — Telehealth: Payer: Self-pay | Admitting: *Deleted

## 2018-08-10 NOTE — Telephone Encounter (Signed)
Pt given results per Dr Caryl Bis, "Please let the patient know that her urine does have some protein in it. There is also a small amount of bilirubin. I would like to recheck her urine and check liver enzymes in 1 to 2 weeks. Orders have been placed. Her kidney function has worsened mildly likely related to the increased dose of losartan. We will recheck this with her lab work": she verbalized understanding; link for my chart sent to pt's email: [email or web address]; the pt can be contacted at [phone], and a message can be left on voicemail; will route to office for final disposition; she sees Dr Caryl Bis, [facility].

## 2018-08-10 NOTE — Telephone Encounter (Signed)
Called patient and scheduled labs in 2 weeks.  Audrey Wolf,cma

## 2018-08-18 ENCOUNTER — Other Ambulatory Visit: Payer: Self-pay

## 2018-08-18 ENCOUNTER — Encounter: Payer: Self-pay | Admitting: Urology

## 2018-08-18 ENCOUNTER — Ambulatory Visit (INDEPENDENT_AMBULATORY_CARE_PROVIDER_SITE_OTHER): Payer: BC Managed Care – PPO | Admitting: Urology

## 2018-08-18 VITALS — BP 156/74 | HR 77 | Ht 69.0 in | Wt 225.0 lb

## 2018-08-18 DIAGNOSIS — N261 Atrophy of kidney (terminal): Secondary | ICD-10-CM

## 2018-08-18 NOTE — Progress Notes (Signed)
   08/18/2018 9:44 AM   Audrey Wolf 06-Sep-1950 [id number]  Reason for visit: Follow up atrophic left kidney  HPI: I saw Audrey Wolf back in urology clinic today for follow-up of an atrophic left kidney.  To briefly summarize, it seems that in 2014 she had a obstructing left distal ureteral stone and a possible ureteral stricture.  In 2015 there is an operative note from Dr. Jacqlyn Larsen in which he attempted to treat the stone, however he noted only scar tissue on ureteroscopy and was unable to pass a wire to treat the stone or place a stent.  He elected observation at that time.  On review of her CT scan from 2014, she has severe left hydronephrosis with very minimal residual renal parenchyma and a left distal ureteral stone.  It appears Dr. Jacqlyn Larsen discussed possible nephrectomy with her, however this was never pursued.    Her renal ultrasound from 10/21/2017 does confirm severe left-sided hydronephrosis with very minimal residual parenchyma.  She denies any left-sided flank pain, UTIs, or episodes of pyelonephritis since she was last seen in the fall.  She does report her blood pressure has been increasing over the last few months, and her primary is adding an additional medication.  Her renal function is relatively stable with a recent creatinine of 1.19, eGFR 45, and creatinine over the last 2 years has ranged from 0.95-1.18.   ROS: Please see flowsheet from today's date for complete review of systems.  Physical Exam: BP (!) 156/74 (BP Location: Left Arm, Patient Position: Sitting)   Pulse 77   Ht _0  (1.753 m)   Wt 225 lb (102.1 kg)   BMI 33.23 kg/m    Constitutional:  Alert and oriented, No acute distress. Respiratory: Normal respiratory effort, no increased work of breathing. GI: Abdomen is soft, nontender, nondistended, no abdominal masses GU: No CVA tenderness Skin: No rashes, bruises or suspicious lesions. Neurologic: Grossly intact, no focal deficits, moving all 4  extremities. 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  She is completely asymptomatic with stable renal function.  Her blood pressure has been elevated over the last few months requiring additional medication.  We discussed options moving forward including observation versus hand-assisted laparoscopic left nephrectomy. As she has been asymptomatic for greater than 5 years with an atrophic kidney,I would strongly lean towards ongoing observation at this time.   I stressed the importance of presenting to the emergency room immediately if she develops fever and left flank pain, or if she develops right-sided flank pain and/or anuria.  I do not feel that her longstanding left atrophic kidney is contributing to her blood pressure proteinuria, and would recommend ongoing optimization of renal function with medical management and close blood pressure control for her essentially solitary right kidney.  RTC 1 year for BMP and symptom check  A total of 15 minutes were spent face-to-face with the patient, greater than 50% was spent in patient education, counseling, and coordination of care regarding atrophic left kidney, renal function, and management options.    Billey Co, [facility] [address], [place], [place] [address] [phone]

## 2018-08-27 ENCOUNTER — Other Ambulatory Visit: Payer: Self-pay

## 2018-08-27 ENCOUNTER — Other Ambulatory Visit (INDEPENDENT_AMBULATORY_CARE_PROVIDER_SITE_OTHER): Payer: BC Managed Care – PPO

## 2018-08-27 DIAGNOSIS — R809 Proteinuria, unspecified: Secondary | ICD-10-CM | POA: Diagnosis not present

## 2018-08-27 DIAGNOSIS — N183 Chronic kidney disease, stage 3 unspecified: Secondary | ICD-10-CM

## 2018-08-27 LAB — POCT URINALYSIS DIPSTICK
Bilirubin, UA: NEGATIVE
Glucose, UA: NEGATIVE
Ketones, UA: NEGATIVE
Nitrite, UA: NEGATIVE
Protein, UA: NEGATIVE
Spec Grav, UA: 1.02 (ref 1.010–1.025)
Urobilinogen, UA: 0.2 E.U./dL
pH, UA: 6 (ref 5.0–8.0)

## 2018-08-27 LAB — COMPREHENSIVE METABOLIC PANEL
ALT: 22 U/L (ref 0–35)
AST: 20 U/L (ref 0–37)
Albumin: 4.4 g/dL (ref 3.5–5.2)
Alkaline Phosphatase: 87 U/L (ref 39–117)
BUN: 18 mg/dL (ref 6–23)
CO2: 26 mEq/L (ref 19–32)
Calcium: 9.6 mg/dL (ref 8.4–10.5)
Chloride: 107 mEq/L (ref 96–112)
Creatinine, Ser: 0.91 mg/dL (ref 0.40–1.20)
GFR: 61.36 mL/min (ref >60.00)
Glucose, Bld: 104 mg/dL — ABNORMAL HIGH (ref 70–99)
Potassium: 4.1 mEq/L (ref 3.5–5.1)
Sodium: 141 mEq/L (ref 135–145)
Total Bilirubin: 0.6 mg/dL (ref 0.2–1.2)
Total Protein: 7 g/dL (ref 6.0–8.3)

## 2018-08-28 LAB — PROTEIN / CREATININE RATIO, URINE
Creatinine, Urine: 72 mg/dL (ref 20–275)
Protein/Creat Ratio: 97 mg/g creat (ref 21–161)
Protein/Creatinine Ratio: 0.097 mg/mg creat (ref 0.021–0.16)
Total Protein, Urine: 7 mg/dL (ref 5–24)

## 2018-08-30 NOTE — Progress Notes (Signed)
Patient ID: Audrey Wolf, female    DOB: Apr 07, 1950, 68 y.o.   MRN: [id number]  HPI  Audrey Wolf is a 68 y/o female with a history of asthma, CAD, HTN, CKD, NSTEMI (s/p CABG), GERD and chronic heart failure.  Echo report from 05/22/17 reviewed and showed an EF of 50-55% without any regurgitation.   Cardiac catheterization done 05/18/17 revealed 70% stenosis Ostial LM with recommendation of CABG.   Has not been admitted or been in the ED in the last 6 months.   She presents today for a follow-up visit with a chief complaint of minimal shortness of breath upon moderate exertion. She describes this as chronic in nature having been present for several years. She has associated fatigue, cough, palpitations and difficulty sleeping along with this. She denies any dizziness, abdominal distention, pedal edema, chest pain or weight gain.   Past Medical History:  Diagnosis Date  . Arthritis   . Asthma   . CHF (congestive heart failure) ([place])   . Chronic kidney disease   . Coronary artery disease   . GERD (gastroesophageal reflux disease)   . Headache   . Hypertension   . Nephrolithiasis   . NSTEMI (non-ST elevated myocardial infarction) [facility])    Past Surgical History:  Procedure Laterality Date  . ABDOMINAL HYSTERECTOMY    . APPENDECTOMY    . BREAST BIOPSY Left 12/31/2010   neg  . CORONARY ARTERY BYPASS GRAFT N/A 05/22/2017   Procedure: CORONARY ARTERY BYPASS GRAFTING (CABG) x 2 WITH ENDOSCOPIC HARVESTING OF RIGHT SAPHENOUS VEIN;  Surgeon: Grace Isaac, MD;  Location: [place];  Service: Open Heart Surgery;  Laterality: N/A;  . LEFT HEART CATH AND CORONARY ANGIOGRAPHY N/A 05/18/2017   Procedure: LEFT HEART CATH AND CORONARY ANGIOGRAPHY;  Surgeon: Dionisio David, MD;  Location: [place] [facility];  Service: Cardiovascular;  Laterality: N/A;  . LITHOTRIPSY    . TEE WITHOUT CARDIOVERSION N/A 05/22/2017   Procedure: TRANSESOPHAGEAL ECHOCARDIOGRAM (TEE);  Surgeon: Grace Isaac,  MD;  Location: [place];  Service: Open Heart Surgery;  Laterality: N/A;  . TONSILLECTOMY     Family History  Problem Relation Age of Onset  . Arthritis Mother   . Arthritis Father   . Heart disease Father   . Stroke Father   . Sudden Cardiac Death Father   . Breast cancer Maternal Aunt    Social History   Tobacco Use  . Smoking status: Never Smoker  . Smokeless tobacco: Never Used  Substance Use Topics  . Alcohol use: No   Allergies  Allergen Reactions  . Lisinopril Cough  . Ace Inhibitors Other (See Comments)    Has been told to avoid these and any diuretics because "left kidney has shrunk up and doesn't work"   Prior to Admission medications   Medication Sig Start Date End Date Taking? Authorizing Provider  acetaminophen (TYLENOL) 500 MG tablet Take 1 tablet (500 mg total) by mouth daily as needed for mild pain or headache. 05/27/17  Yes Lars Pinks M, PA-C  albuterol (VENTOLIN HFA) 108 (90 Base) MCG/ACT inhaler Inhale 2 puffs into the lungs every 6 (six) hours as needed for wheezing or shortness of breath. 08/31/18  Yes Darylene Price A, FNP  amLODipine (NORVASC) 10 MG tablet Take 1 tablet by mouth once daily 05/30/18  Yes Darylene Price A, FNP  aspirin EC 325 MG EC tablet Take 1 tablet (325 mg total) by mouth daily. 05/27/17  Yes Nani Skillern, PA-C  fluticasone (FLONASE) 50 MCG/ACT nasal spray Place 2 sprays into both nostrils daily. 11/10/17  Yes Glori LuisSonnenberg, Eric G, MD  loratadine (CLARITIN) 10 MG tablet Take 10 mg by mouth daily.    Yes [provider]  losartan (COZAAR) 100 MG tablet Take 1 tablet (100 mg total) by mouth daily. 07/25/18  Yes Glori LuisSonnenberg, Eric G, MD  metoprolol tartrate (LOPRESSOR) 100 MG tablet Take 1 tablet (100 mg total) by mouth 2 (two) times daily. 05/04/18 10/31/18 Yes Delma FreezeHackney, Tina A, FNP  Multiple Vitamins-Minerals (PRESERVISION AREDS 2) CAPS Take 2 capsules by mouth daily.   Yes [provider]  rosuvastatin (CRESTOR) 20 MG  tablet Take 1 tablet (20 mg total) by mouth daily. 08/31/18  Yes Delma FreezeHackney, Tina A, FNP    Review of Systems  Constitutional: Positive for fatigue. Negative for appetite change.  HENT: Negative for congestion, postnasal drip and sore throat.   Eyes: Negative.   Respiratory: Positive for cough (dry cough in the mornings) and shortness of breath. Negative for chest tightness.   Cardiovascular: Positive for palpitations (on occasion). Negative for chest pain and leg swelling.  Gastrointestinal: Negative for abdominal distention and abdominal pain.  Endocrine: Negative.   Genitourinary: Negative.   Musculoskeletal: Positive for arthralgias (left leg pain). Negative for back pain and neck pain.  Skin: Negative.   Allergic/Immunologic: Negative.   Neurological: Negative for dizziness and light-headedness.  Hematological: Negative for adenopathy. Does not bruise/bleed easily.  Psychiatric/Behavioral: Positive for sleep disturbance (now sleeping in the bed).   Vitals:   08/31/18 0951  BP: (!) 163/65  Pulse: 64  Resp: 18  SpO2: 99%  Weight: 226 lb 6 oz (102.7 kg)  Height: 5\' 9"  (1.753 m)   Wt Readings from Last 3 Encounters:  08/31/18 226 lb 6 oz (102.7 kg)  08/18/18 225 lb (102.1 kg)  05/04/18 220 lb (99.8 kg)   Lab Results  Component Value Date   CREATININE 0.91 08/27/2018   CREATININE 1.19 08/02/2018   CREATININE 0.97 07/13/2018    Physical Exam Vitals signs and nursing note reviewed.  Constitutional:      Appearance: She is well-developed.  HENT:     Head: Normocephalic and atraumatic.  Neck:     Musculoskeletal: Normal range of motion and neck supple.     Vascular: No JVD.  Cardiovascular:     Rate and Rhythm: Normal rate and regular rhythm.  Pulmonary:     Effort: Pulmonary effort is normal. No respiratory distress.     Breath sounds: No wheezing or rales.  Abdominal:     General: There is no distension.     Palpations: Abdomen is soft.  Musculoskeletal:         General: No tenderness.  Skin:    General: Skin is warm and dry.  Neurological:     Mental Status: She is alert and oriented to person, place, and time.  Psychiatric:        Behavior: Behavior normal.    Assessment & Plan:  1: Chronic heart failure with preserved ejection fraction- - NYHA class II - euvolemic today - weighing daily. Reminded to call for an overnight weight gain of >2 pounds or a weekly weight gain of >5 pounds - weight up 3 pounds from last visit 4 months ago - not adding salt and trying to keep her daily sodium intake to 2000mg  daily - saw cardiology Welton Flakes(Khan)  - BNP on 05/16/17 was 461.0  2: HTN- - BP initially elevated (163/65) but had improved  with recheck with manual cuff (138/70) - saw PCP Birdie Sons(Sonnenberg) 07/05/2018 - BMP on 7/24//2020 reviewed and showed sodium 141, potassium 4.1, creatinine 0.91 and GFR 61.36  3: NSTEMI- - CABG done 05/22/17  - saw cardiac surgeon 08/27/17 - she works at [employer] and says that she's quite active at work  Patient did not bring her medications nor a list. Each medication was verbally reviewed with the patient and she was encouraged to bring the bottles to every visit to confirm accuracy of list.  Return in 6 months or sooner for any questions/problems before then.

## 2018-08-31 ENCOUNTER — Encounter: Payer: Self-pay | Admitting: Family

## 2018-08-31 ENCOUNTER — Ambulatory Visit: Payer: BC Managed Care – PPO | Attending: Family | Admitting: Family

## 2018-08-31 ENCOUNTER — Other Ambulatory Visit: Payer: Self-pay

## 2018-08-31 VITALS — BP 138/70 | HR 64 | Resp 18 | Ht 69.0 in | Wt 226.4 lb

## 2018-08-31 DIAGNOSIS — Z7982 Long term (current) use of aspirin: Secondary | ICD-10-CM | POA: Diagnosis not present

## 2018-08-31 DIAGNOSIS — I2510 Atherosclerotic heart disease of native coronary artery without angina pectoris: Secondary | ICD-10-CM | POA: Diagnosis not present

## 2018-08-31 DIAGNOSIS — Z8249 Family history of ischemic heart disease and other diseases of the circulatory system: Secondary | ICD-10-CM | POA: Insufficient documentation

## 2018-08-31 DIAGNOSIS — I5032 Chronic diastolic (congestive) heart failure: Secondary | ICD-10-CM | POA: Diagnosis not present

## 2018-08-31 DIAGNOSIS — I130 Hypertensive heart and chronic kidney disease with heart failure and stage 1 through stage 4 chronic kidney disease, or unspecified chronic kidney disease: Secondary | ICD-10-CM | POA: Insufficient documentation

## 2018-08-31 DIAGNOSIS — Z888 Allergy status to other drugs, medicaments and biological substances status: Secondary | ICD-10-CM | POA: Diagnosis not present

## 2018-08-31 DIAGNOSIS — I214 Non-ST elevation (NSTEMI) myocardial infarction: Secondary | ICD-10-CM

## 2018-08-31 DIAGNOSIS — J45909 Unspecified asthma, uncomplicated: Secondary | ICD-10-CM | POA: Insufficient documentation

## 2018-08-31 DIAGNOSIS — I252 Old myocardial infarction: Secondary | ICD-10-CM | POA: Diagnosis not present

## 2018-08-31 DIAGNOSIS — Z79899 Other long term (current) drug therapy: Secondary | ICD-10-CM | POA: Insufficient documentation

## 2018-08-31 DIAGNOSIS — Z951 Presence of aortocoronary bypass graft: Secondary | ICD-10-CM | POA: Diagnosis not present

## 2018-08-31 DIAGNOSIS — K219 Gastro-esophageal reflux disease without esophagitis: Secondary | ICD-10-CM | POA: Insufficient documentation

## 2018-08-31 DIAGNOSIS — N189 Chronic kidney disease, unspecified: Secondary | ICD-10-CM | POA: Insufficient documentation

## 2018-08-31 DIAGNOSIS — I10 Essential (primary) hypertension: Secondary | ICD-10-CM

## 2018-08-31 MED ORDER — ALBUTEROL SULFATE HFA 108 (90 BASE) MCG/ACT IN AERS: 2.0000 | INHALATION_SPRAY | Freq: Four times a day (QID) | RESPIRATORY_TRACT | 3 refills | Status: DC | PRN

## 2018-08-31 MED ORDER — ROSUVASTATIN CALCIUM 20 MG PO TABS: 20.0000 mg | ORAL_TABLET | Freq: Every day | ORAL | 3 refills | Status: DC

## 2018-08-31 NOTE — Patient Instructions (Signed)
Continue weighing daily and call for an overnight weight gain of > 2 pounds or a weekly weight gain of >5 pounds. 

## 2018-09-02 ENCOUNTER — Other Ambulatory Visit: Payer: Self-pay | Admitting: Family Medicine

## 2018-09-02 DIAGNOSIS — R8290 Unspecified abnormal findings in urine: Secondary | ICD-10-CM

## 2018-09-07 ENCOUNTER — Other Ambulatory Visit: Payer: Self-pay

## 2018-09-07 ENCOUNTER — Other Ambulatory Visit (INDEPENDENT_AMBULATORY_CARE_PROVIDER_SITE_OTHER): Payer: BC Managed Care – PPO

## 2018-09-07 DIAGNOSIS — R8290 Unspecified abnormal findings in urine: Secondary | ICD-10-CM

## 2018-09-07 LAB — URINALYSIS, ROUTINE W REFLEX MICROSCOPIC
Bilirubin Urine: NEGATIVE
Hgb urine dipstick: NEGATIVE
Ketones, ur: NEGATIVE
Nitrite: NEGATIVE
Specific Gravity, Urine: >=1.03 — AB (ref 1.000–1.030)
Urine Glucose: NEGATIVE
Urobilinogen, UA: 2 — AB (ref 0.0–1.0)
pH: 6 (ref 5.0–8.0)

## 2018-09-19 ENCOUNTER — Other Ambulatory Visit: Payer: Self-pay | Admitting: Family Medicine

## 2018-09-19 DIAGNOSIS — R8290 Unspecified abnormal findings in urine: Secondary | ICD-10-CM

## 2018-10-01 DIAGNOSIS — I10 Essential (primary) hypertension: Secondary | ICD-10-CM | POA: Diagnosis not present

## 2018-10-01 DIAGNOSIS — N182 Chronic kidney disease, stage 2 (mild): Secondary | ICD-10-CM | POA: Diagnosis not present

## 2018-10-01 DIAGNOSIS — N1330 Unspecified hydronephrosis: Secondary | ICD-10-CM | POA: Diagnosis not present

## 2018-10-14 ENCOUNTER — Other Ambulatory Visit: Payer: Self-pay

## 2018-10-15 ENCOUNTER — Encounter: Payer: Self-pay | Admitting: Family Medicine

## 2018-10-15 ENCOUNTER — Ambulatory Visit (INDEPENDENT_AMBULATORY_CARE_PROVIDER_SITE_OTHER): Payer: BC Managed Care – PPO | Admitting: Family Medicine

## 2018-10-15 ENCOUNTER — Other Ambulatory Visit: Payer: Self-pay

## 2018-10-15 VITALS — BP 158/90 | HR 67 | Temp 97.7°F | Ht 69.0 in | Wt 224.8 lb

## 2018-10-15 DIAGNOSIS — E785 Hyperlipidemia, unspecified: Secondary | ICD-10-CM

## 2018-10-15 DIAGNOSIS — I2510 Atherosclerotic heart disease of native coronary artery without angina pectoris: Secondary | ICD-10-CM

## 2018-10-15 DIAGNOSIS — Z1239 Encounter for other screening for malignant neoplasm of breast: Secondary | ICD-10-CM

## 2018-10-15 DIAGNOSIS — R7303 Prediabetes: Secondary | ICD-10-CM | POA: Diagnosis not present

## 2018-10-15 DIAGNOSIS — Z23 Encounter for immunization: Secondary | ICD-10-CM | POA: Diagnosis not present

## 2018-10-15 DIAGNOSIS — Z1231 Encounter for screening mammogram for malignant neoplasm of breast: Secondary | ICD-10-CM

## 2018-10-15 DIAGNOSIS — I10 Essential (primary) hypertension: Secondary | ICD-10-CM | POA: Diagnosis not present

## 2018-10-15 NOTE — Patient Instructions (Addendum)
Nice to see you. We are going to start you on a new blood pressure medication.  I would like to check with our clinical pharmacist to help determine what the best option is for you given your kidney issues. Please call to schedule your mammogram.

## 2018-10-15 NOTE — Assessment & Plan Note (Signed)
I encouraged dietary changes though the patient does not like vegetables and never has.  We will continue to reinforce good dietary decisions.

## 2018-10-15 NOTE — Progress Notes (Signed)
Tommi Rumps, MD Phone: [phone]  Audrey Wolf is a 68 y.o. female who presents today for f/u.  HYPERTENSION  Disease Monitoring  Home BP Monitoring 242-683 systolic Chest pain- no    Dyspnea- no Medications  Compliance-  Taking amlodipine, losartan, metoprolol.  Edema- no  HYPERLIPIDEMIA Symptoms Chest pain on exertion:  no    Medications: Compliance- taking crestor Right upper quadrant pain- no  Muscle aches- no  Prediabetes: No polyuria or polydipsia.  Diet is relatively unhealthy.  She eats a lot of chicken.  Eats lots of carbs.  Occasional salad though denies any other vegetable intake.  She is active at work.      Social History   Tobacco Use  Smoking Status Never Smoker  Smokeless Tobacco Never Used     ROS see history of present illness  Objective  Physical Exam Vitals:   10/15/18 0950 10/15/18 1029  BP: (!) 160/90 (!) 158/90  Pulse: 67   Temp: 97.7 F (36.5 C)   SpO2: 99%     BP Readings from Last 3 Encounters:  10/15/18 (!) 158/90  08/31/18 138/70  08/18/18 (!) 156/74   Wt Readings from Last 3 Encounters:  10/15/18 224 lb 12.8 oz (102 kg)  08/31/18 226 lb 6 oz (102.7 kg)  08/18/18 225 lb (102.1 kg)    Physical Exam Constitutional:      General: She is not in acute distress.    Appearance: She is not diaphoretic.  Cardiovascular:     Rate and Rhythm: Normal rate and regular rhythm.     Heart sounds: Normal heart sounds.  Pulmonary:     Effort: Pulmonary effort is normal.     Breath sounds: Normal breath sounds.  Musculoskeletal:     Right lower leg: No edema.     Left lower leg: No edema.  Skin:    General: Skin is warm and dry.  Neurological:     Mental Status: She is alert.      Assessment/Plan: Please see individual problem list.  Hyperlipidemia Continue Crestor.  Plan to recheck labs next month.  Prediabetes I encouraged dietary changes though the patient does not like vegetables and never has.  We will  continue to reinforce good dietary decisions.  Hypertension Uncontrolled.  Discussed addition of medication and we will discuss with her clinical pharmacist regarding an appropriate option.  She will continue her current regimen.  Follow-up in 1 month.   Health Maintenance: Mammogram ordered.  Patient know she needs to call to schedule.  Orders Placed This Encounter  Procedures  . MM 3D SCREEN BREAST BILATERAL    Standing Status:   Future    Standing Expiration Date:   12/15/2019    Order Specific Question:   Reason for Exam (SYMPTOM  OR DIAGNOSIS REQUIRED)    Answer:   breast cancer screening    Order Specific Question:   Preferred imaging location?    Answer:   [facility]  . Flu Vaccine QUAD High Dose(Fluad)  . Pneumococcal polysaccharide vaccine 23-valent greater than or equal to 2yo subcutaneous/IM  . Lipid panel    Standing Status:   Future    Standing Expiration Date:   10/15/2019  . Comp Met (CMET)    Standing Status:   Future    Standing Expiration Date:   10/15/2019  . Hemoglobin A1c    Standing Status:   Future    Standing Expiration Date:   10/15/2019    No orders of the defined types  were placed in this encounter.    Tommi Rumps, MD [place]

## 2018-10-15 NOTE — Assessment & Plan Note (Signed)
Continue Crestor.  Plan to recheck labs next month.

## 2018-10-15 NOTE — Assessment & Plan Note (Signed)
Uncontrolled.  Discussed addition of medication and we will discuss with her clinical pharmacist regarding an appropriate option.  She will continue her current regimen.  Follow-up in 1 month.

## 2018-10-26 ENCOUNTER — Telehealth: Payer: Self-pay | Admitting: Family Medicine

## 2018-10-26 NOTE — Telephone Encounter (Signed)
Please let the patient know that I heard back from the pharmacist regarding changing to coreg. She recommended changing to coreg 25 mg twice daily and discontinue the metoprolol. Please make sure the patient is ok making this change and then I can send it in for her to start with.

## 2018-10-26 NOTE — Telephone Encounter (Signed)
-----   Message from De Hollingshead, [facility] sent at 10/18/2018  9:22 AM EDT ----- Hmm. This is tricky.   I can't find anything that would prevent use of thiazide diuretics in chronic hydronephrosis. However, it looks like a hx kidney stone may have exacerbated her issues? So I actually would avoid HCTZ, to prevent increasing the risk of recurrent issues.   I think switching to carvedilol would be a great option, would do carvedilol 25 mg PO BID. Looks like she had been on it before and was switched around the time of the CABG; I think I learned that they typically switch to metoprolol perioperatively because it can be more quickly titrated.   Catie ----- Message ----- From: Leone Haven, MD Sent: 10/15/2018   5:46 PM EDT To: De Hollingshead, [facility]  Hey Catie,   This patient has uncontrolled HTN. She is on amlodipine, losartan, and metoprolol. She has a unilateral small kidney with chronic hydronephrosis. My first thought was to start her on HCTZ, though with her abnormal kidney I was not sure about that and wanted to see what you thought. I can always reach out to the nephrologist to see what they think. I also thought of changing her metoprolol to coreg and would need to check with her cardiologist on that. Any other thoughts on BP meds for her? Thanks.   Randall Hiss

## 2018-10-27 NOTE — Telephone Encounter (Signed)
Lmtcb. [employer] for [employer] to advise. Please let the patient know that the provider  heard back from the pharmacist regarding changing to coreg. She recommended changing to coreg 25 mg twice daily and discontinue the metoprolol. Please make sure the patient is ok making this change and then I can send it in for her to start with.  Journii Nierman,cma

## 2018-11-10 ENCOUNTER — Other Ambulatory Visit: Payer: Self-pay

## 2018-11-12 ENCOUNTER — Ambulatory Visit (INDEPENDENT_AMBULATORY_CARE_PROVIDER_SITE_OTHER): Payer: BC Managed Care – PPO | Admitting: Family Medicine

## 2018-11-12 ENCOUNTER — Other Ambulatory Visit: Payer: Self-pay

## 2018-11-12 ENCOUNTER — Encounter: Payer: Self-pay | Admitting: Family Medicine

## 2018-11-12 VITALS — BP 140/70 | HR 76 | Temp 98.2°F | Ht 69.0 in | Wt 227.2 lb

## 2018-11-12 DIAGNOSIS — J4520 Mild intermittent asthma, uncomplicated: Secondary | ICD-10-CM | POA: Diagnosis not present

## 2018-11-12 DIAGNOSIS — N2889 Other specified disorders of kidney and ureter: Secondary | ICD-10-CM

## 2018-11-12 DIAGNOSIS — R7303 Prediabetes: Secondary | ICD-10-CM

## 2018-11-12 DIAGNOSIS — R5382 Chronic fatigue, unspecified: Secondary | ICD-10-CM | POA: Diagnosis not present

## 2018-11-12 DIAGNOSIS — I2510 Atherosclerotic heart disease of native coronary artery without angina pectoris: Secondary | ICD-10-CM

## 2018-11-12 DIAGNOSIS — E785 Hyperlipidemia, unspecified: Secondary | ICD-10-CM

## 2018-11-12 DIAGNOSIS — I151 Hypertension secondary to other renal disorders: Secondary | ICD-10-CM | POA: Diagnosis not present

## 2018-11-12 NOTE — Progress Notes (Signed)
  Tommi Rumps, MD Phone: [phone]  Audrey Wolf is a 68 y.o. female who presents today for f/u.  HYPERTENSION  Disease Monitoring  Home BP Monitoring 138-160/60-70 Chest pain- no    Dyspnea- no Medications  Compliance-  Taking metoprolol, amlodipine, losartan.  Edema- no  Fatigue: Patient notes since her last visit she has had some fatigue and decreased energy.  She has a lack of motivation and just does not feel like doing anything.  She does note some depression and that has been going on for a while.  Occasional anxiety.  She does not physically feel bad.  She notes she scheduled an appointment with her cardiologist regarding the fatigue though she notes no chest pain or shortness of breath.  Asthma: Notes this is typically seasonal and typically only has to use her albuterol inhaler with weather changes.  She has used it once or twice this week though had not used it in quite some time prior to now.    Social History   Tobacco Use  Smoking Status Never Smoker  Smokeless Tobacco Never Used     ROS see history of present illness  Objective  Physical Exam Vitals:   11/12/18 1410  BP: 140/70  Pulse: 76  Temp: 98.2 F (36.8 C)  SpO2: 97%    BP Readings from Last 3 Encounters:  11/12/18 140/70  10/15/18 (!) 158/90  08/31/18 138/70   Wt Readings from Last 3 Encounters:  11/12/18 227 lb 3.2 oz (103.1 kg)  10/15/18 224 lb 12.8 oz (102 kg)  08/31/18 226 lb 6 oz (102.7 kg)    Physical Exam Constitutional:      General: She is not in acute distress.    Appearance: She is not diaphoretic.  Neck:     Thyroid: No thyroid mass, thyromegaly or thyroid tenderness.  Cardiovascular:     Rate and Rhythm: Normal rate and regular rhythm.     Heart sounds: Normal heart sounds.  Pulmonary:     Effort: Pulmonary effort is normal.     Breath sounds: Normal breath sounds.  Musculoskeletal:     Right lower leg: No edema.     Left lower leg: No edema.  Skin:  General: Skin is warm and dry.  Neurological:     Mental Status: She is alert.      Assessment/Plan: Please see individual problem list.  Hypertension Above goal.  Noted that she does have asthma and thus carvedilol would likely not be a great option for her.  We will discuss with our clinical pharmacist to determine the next best medication given her renal issues.  She will continue her current regimen.  Asthma Rare use of albuterol.  She will continue to monitor.  Chronic fatigue I suspect depression is playing a role.  Will obtain lab work to evaluate for other causes.  Discussed treatment with medication or therapy for depression though she declined this at this time.  Advised that if she develops suicidal ideation she needs to seek medical attention in the ED.    Orders Placed This Encounter  Procedures  . Vitamin D (25 hydroxy)  . B12  . CBC  . TSH  . Lipid panel  . Comp Met (CMET)  . HgB A1c    No orders of the defined types were placed in this encounter.    Tommi Rumps, MD [place]

## 2018-11-12 NOTE — Assessment & Plan Note (Signed)
I suspect depression is playing a role.  Will obtain lab work to evaluate for other causes.  Discussed treatment with medication or therapy for depression though she declined this at this time.  Advised that if she develops suicidal ideation she needs to seek medical attention in the ED.

## 2018-11-12 NOTE — Assessment & Plan Note (Signed)
Rare use of albuterol.  She will continue to monitor.

## 2018-11-12 NOTE — Addendum Note (Signed)
Addended by: Leeanne Rio on: 11/12/2018 02:57 PM   Modules accepted: Orders

## 2018-11-12 NOTE — Patient Instructions (Signed)
Nice to see you. We will get lab work today and contact you with the results. We will contact you with your new blood pressure medication as well.

## 2018-11-12 NOTE — Assessment & Plan Note (Signed)
Above goal.  Noted that she does have asthma and thus carvedilol would likely not be a great option for her.  We will discuss with our clinical pharmacist to determine the next best medication given her renal issues.  She will continue her current regimen.

## 2018-11-13 LAB — CBC
HCT: 37.2 % (ref 35.0–45.0)
Hemoglobin: 12.8 g/dL (ref 11.7–15.5)
MCH: 30.5 pg (ref 27.0–33.0)
MCHC: 34.4 g/dL (ref 32.0–36.0)
MCV: 88.6 fL (ref 80.0–100.0)
MPV: 11.5 fL (ref 7.5–12.5)
Platelets: 234 10*3/uL (ref 140–400)
RBC: 4.2 10*6/uL (ref 3.80–5.10)
RDW: 12.8 % (ref 11.0–15.0)
WBC: 7 10*3/uL (ref 3.8–10.8)

## 2018-11-13 LAB — COMPREHENSIVE METABOLIC PANEL
AG Ratio: 1.9 (calc) (ref 1.0–2.5)
ALT: 19 U/L (ref 6–29)
AST: 19 U/L (ref 10–35)
Albumin: 4.1 g/dL (ref 3.6–5.1)
Alkaline phosphatase (APISO): 81 U/L (ref 37–153)
BUN/Creatinine Ratio: 20 (calc) (ref 6–22)
BUN: 21 mg/dL (ref 7–25)
CO2: 23 mmol/L (ref 20–32)
Calcium: 9.6 mg/dL (ref 8.6–10.4)
Chloride: 108 mmol/L (ref 98–110)
Creat: 1.04 mg/dL — ABNORMAL HIGH (ref 0.50–0.99)
Globulin: 2.2 g/dL (calc) (ref 1.9–3.7)
Glucose, Bld: 74 mg/dL (ref 65–99)
Potassium: 4.3 mmol/L (ref 3.5–5.3)
Sodium: 141 mmol/L (ref 135–146)
Total Bilirubin: 0.6 mg/dL (ref 0.2–1.2)
Total Protein: 6.3 g/dL (ref 6.1–8.1)

## 2018-11-13 LAB — LIPID PANEL
Cholesterol: 102 mg/dL (ref <200)
HDL: 34 mg/dL — ABNORMAL LOW (ref >=50)
LDL Cholesterol (Calc): 50 mg/dL (calc)
Non-HDL Cholesterol (Calc): 68 mg/dL (calc) (ref <130)
Total CHOL/HDL Ratio: 3 (calc) (ref <5.0)
Triglycerides: 96 mg/dL (ref <150)

## 2018-11-13 LAB — HEMOGLOBIN A1C
Hgb A1c MFr Bld: 5.4 % of total Hgb (ref <5.7)
Mean Plasma Glucose: 108 (calc)
eAG (mmol/L): 6 (calc)

## 2018-11-13 LAB — VITAMIN B12: Vitamin B-12: 524 pg/mL (ref 200–1100)

## 2018-11-13 LAB — TSH: TSH: 2.9 mIU/L (ref 0.40–4.50)

## 2018-11-13 LAB — VITAMIN D 25 HYDROXY (VIT D DEFICIENCY, FRACTURES): Vit D, 25-Hydroxy: 40 ng/mL (ref 30–100)

## 2018-11-14 ENCOUNTER — Telehealth: Payer: Self-pay | Admitting: Family Medicine

## 2018-11-14 NOTE — Telephone Encounter (Signed)
-----  Message from De Hollingshead, [facility] sent at 11/12/2018  5:16 PM EDT ----- I thought I recognized this name!  Since her asthma is well controlled, I'm not too concerned about carvedilol. If someone had poorly controlled lung disease, I would be more concerned. Additionally metoprolol becomes less cardioselective with higher doses, so I don't think we would necessarily be increasing her risk, switching from metoprolol 100 mg BID to carvediol 25 mg BID.   I'm not finding a hard contraindication to use spironolactone in this situation, but I would probably want to get nephrology blessing before starting. EGFR appears appropriate for initiation (pending the ones drawn today), but I wonder if the single kidney would decrease efficacy.   Hydralazine would also be an option. I always hate adding a TID medication, but would be an option if you're uncomfortable with the above.   Would also see about her taking either losartan or amlodipine in the evening, and the other in the morning. Try to balance things out better across the day, and the data we have supporting nighttime dosing of antihypertensives possibly reducing CV risk.   Catie  ----- Message ----- From: Leone Haven, MD Sent: 11/12/2018   2:53 PM EDT To: De Hollingshead, [place] Catie,   I wanted to switch this patient to coreg from metoprolol though noticed she has a history of asthma and did not think the coreg would be a good idea. She has one kidney at this time and does have renal dysfunction which makes me hesitant to use HCTZ. What do you think about spironolactone? Or Hydralazine? I would prefer not to use clonidine. Thanks for your help.  Randall Hiss

## 2018-11-14 NOTE — Telephone Encounter (Signed)
Please let the patient know I heard back from the clinical pharmacist and she noted that the coreg should be ok to use in her and I would like to make the change from metoprolol to coreg. I can send this in once you speak with her. Also, please find out what time of day she is taking her amlodipine and losartan. It would likely be helpful for her to take one in the morning and one in the evening. Thanks.

## 2018-11-15 MED ORDER — CARVEDILOL 25 MG PO TABS: 25.0000 mg | ORAL_TABLET | Freq: Two times a day (BID) | ORAL | 3 refills | Status: DC

## 2018-11-15 NOTE — Telephone Encounter (Signed)
Lm informing the patient that her medication was sent to pharmacy and she is to stopt the metroprolol  and then start the coreg the next day.  Nina,cma

## 2018-11-15 NOTE — Telephone Encounter (Signed)
Coreg sent to pharmacy.  Metoprolol discontinued in the computer.  Please let the patient know that I sent this to her pharmacy and she should discontinue the metoprolol 1 day and then start the Coreg the next day.

## 2018-11-15 NOTE — Addendum Note (Signed)
Addended by: Caryl Bis Aspen Deterding G on: 11/15/2018 12:44 PM   Modules accepted: Orders

## 2018-11-15 NOTE — Telephone Encounter (Signed)
I called and in formed the patient that you wanted to switch from metropolol to coreg and she is ok with the change and I also informed her to take her BP meds one in the evening and one in the morning and pt understood.   Nina,.cma

## 2018-11-22 DIAGNOSIS — R0602 Shortness of breath: Secondary | ICD-10-CM | POA: Diagnosis not present

## 2018-11-22 DIAGNOSIS — E782 Mixed hyperlipidemia: Secondary | ICD-10-CM | POA: Diagnosis not present

## 2018-11-22 DIAGNOSIS — I25810 Atherosclerosis of coronary artery bypass graft(s) without angina pectoris: Secondary | ICD-10-CM | POA: Diagnosis not present

## 2018-11-22 DIAGNOSIS — M79662 Pain in left lower leg: Secondary | ICD-10-CM | POA: Diagnosis not present

## 2018-11-22 DIAGNOSIS — I2510 Atherosclerotic heart disease of native coronary artery without angina pectoris: Secondary | ICD-10-CM | POA: Diagnosis not present

## 2018-11-22 DIAGNOSIS — R071 Chest pain on breathing: Secondary | ICD-10-CM | POA: Diagnosis not present

## 2018-11-22 DIAGNOSIS — I10 Essential (primary) hypertension: Secondary | ICD-10-CM | POA: Diagnosis not present

## 2018-11-30 DIAGNOSIS — I25810 Atherosclerosis of coronary artery bypass graft(s) without angina pectoris: Secondary | ICD-10-CM | POA: Diagnosis not present

## 2018-11-30 DIAGNOSIS — R071 Chest pain on breathing: Secondary | ICD-10-CM | POA: Diagnosis not present

## 2018-11-30 DIAGNOSIS — M79662 Pain in left lower leg: Secondary | ICD-10-CM | POA: Diagnosis not present

## 2018-11-30 DIAGNOSIS — I2510 Atherosclerotic heart disease of native coronary artery without angina pectoris: Secondary | ICD-10-CM | POA: Diagnosis not present

## 2018-11-30 DIAGNOSIS — E782 Mixed hyperlipidemia: Secondary | ICD-10-CM | POA: Diagnosis not present

## 2018-11-30 DIAGNOSIS — I10 Essential (primary) hypertension: Secondary | ICD-10-CM | POA: Diagnosis not present

## 2018-11-30 DIAGNOSIS — R0602 Shortness of breath: Secondary | ICD-10-CM | POA: Diagnosis not present

## 2018-12-24 ENCOUNTER — Ambulatory Visit
Admission: RE | Admit: 2018-12-24 | Discharge: 2018-12-24 | Disposition: A | Discharge status: Home / Self Care | Payer: BC Managed Care – PPO | Source: Ambulatory Visit | Attending: Family Medicine | Admitting: Family Medicine

## 2018-12-24 DIAGNOSIS — Z1231 Encounter for screening mammogram for malignant neoplasm of breast: Secondary | ICD-10-CM | POA: Insufficient documentation

## 2019-02-02 DIAGNOSIS — I10 Essential (primary) hypertension: Secondary | ICD-10-CM | POA: Diagnosis not present

## 2019-02-02 DIAGNOSIS — N1330 Unspecified hydronephrosis: Secondary | ICD-10-CM | POA: Diagnosis not present

## 2019-02-02 DIAGNOSIS — N182 Chronic kidney disease, stage 2 (mild): Secondary | ICD-10-CM | POA: Diagnosis not present

## 2019-02-15 ENCOUNTER — Other Ambulatory Visit: Payer: Self-pay

## 2019-02-15 ENCOUNTER — Ambulatory Visit (INDEPENDENT_AMBULATORY_CARE_PROVIDER_SITE_OTHER): Payer: BC Managed Care – PPO | Admitting: Family Medicine

## 2019-02-15 ENCOUNTER — Encounter: Payer: Self-pay | Admitting: Family Medicine

## 2019-02-15 DIAGNOSIS — I151 Hypertension secondary to other renal disorders: Secondary | ICD-10-CM | POA: Diagnosis not present

## 2019-02-15 DIAGNOSIS — I5032 Chronic diastolic (congestive) heart failure: Secondary | ICD-10-CM | POA: Diagnosis not present

## 2019-02-15 DIAGNOSIS — R1013 Epigastric pain: Secondary | ICD-10-CM | POA: Diagnosis not present

## 2019-02-15 DIAGNOSIS — R5382 Chronic fatigue, unspecified: Secondary | ICD-10-CM

## 2019-02-15 DIAGNOSIS — I2510 Atherosclerotic heart disease of native coronary artery without angina pectoris: Secondary | ICD-10-CM | POA: Diagnosis not present

## 2019-02-15 DIAGNOSIS — N2889 Other specified disorders of kidney and ureter: Secondary | ICD-10-CM | POA: Diagnosis not present

## 2019-02-15 DIAGNOSIS — J309 Allergic rhinitis, unspecified: Secondary | ICD-10-CM

## 2019-02-15 MED ORDER — CARVEDILOL 25 MG PO TABS: 25.0000 mg | ORAL_TABLET | Freq: Two times a day (BID) | ORAL | 3 refills | Status: DC

## 2019-02-15 NOTE — Assessment & Plan Note (Signed)
Uncontrolled.  We will send a message to her nephrologist to get their input regarding the next step in BP management.  She does have an atrophic kidney though her kidney function seems to be relatively stable.  I wonder if we could use spironolactone or HCTZ though I would like to get her nephrologist input first.

## 2019-02-15 NOTE — Assessment & Plan Note (Signed)
Single episode of this after eating barbecue.  Suspect gastritis.  She will monitor for recurrence.

## 2019-02-15 NOTE — Assessment & Plan Note (Signed)
Some mild symptoms.  Discussed consistent use of Flonase.  She will continue Claritin.

## 2019-02-15 NOTE — Assessment & Plan Note (Signed)
Asymptomatic.  She will continue her current regimen. 

## 2019-02-15 NOTE — Assessment & Plan Note (Signed)
No orthopnea, PND, or edema.  She will continue her current regimen.

## 2019-02-15 NOTE — Progress Notes (Signed)
Virtual Visit via video Note  This visit type was conducted due to national recommendations for restrictions regarding the COVID-19 pandemic (e.g. social distancing).  This format is felt to be most appropriate for this patient at this time.  All issues noted in this document were discussed and addressed.  No physical exam was performed (except for noted visual exam findings with Video Visits).   I connected with Audrey Wolf today at  9:30 AM EST by a video enabled telemedicine application or telephone and verified that I am speaking with the correct person using two identifiers. Location patient: home Location provider: work  Persons participating in the virtual visit: patient, provider  I discussed the limitations, risks, security and privacy concerns of performing an evaluation and management service by telephone and the availability of in person appointments. I also discussed with the patient that there may be a patient responsible charge related to this service. The patient expressed understanding and agreed to proceed.   Reason for visit: Follow-up.  HPI: Hypertension/CAD/CHF: Taking amlodipine, carvedilol, losartan.  Also on aspirin.  No chest pain, shortness of breath, edema, orthopnea, or PND.  BPs have been in the 160s over 80s.  Pulse in the 60-70 range.  It appears that her nephrologist advised her to take losartan 100 mg once daily though she notes she was already doing that.  Hyperlipidemia: Taking Crestor.  No right upper quadrant pain.  No myalgias.  Epigastric discomfort: This occurred yesterday after eating some barbecue.  It lasted very briefly.  No nausea, vomiting, or diarrhea.  No symptoms currently.  Allergic rhinitis: She does note occasional nasal stuffiness with some cough.  This occurs with weather changes.  No rhinorrhea.  No fevers.  She is taking Claritin.  She is not using Flonase.  Fatigue: Patient notes this has improved.  No depression.   ROS: See  pertinent positives and negatives per HPI.  Past Medical History:  Diagnosis Date  . Arthritis   . Asthma   . CHF (congestive heart failure) (HCC)   . Chronic kidney disease   . Coronary artery disease   . GERD (gastroesophageal reflux disease)   . Headache   . Hypertension   . Nephrolithiasis   . NSTEMI (non-ST elevated myocardial infarction) [facility])     Past Surgical History:  Procedure Laterality Date  . ABDOMINAL HYSTERECTOMY    . APPENDECTOMY    . BREAST BIOPSY Left 12/31/2010   neg  . CORONARY ARTERY BYPASS GRAFT N/A 05/22/2017   Procedure: CORONARY ARTERY BYPASS GRAFTING (CABG) x 2 WITH ENDOSCOPIC HARVESTING OF RIGHT SAPHENOUS VEIN;  Surgeon: Delight Ovens, MD;  Location: [facility] OR;  Service: Open Heart Surgery;  Laterality: N/A;  . LEFT HEART CATH AND CORONARY ANGIOGRAPHY N/A 05/18/2017   Procedure: LEFT HEART CATH AND CORONARY ANGIOGRAPHY;  Surgeon: Laurier Nancy, MD;  Location: [facility] INVASIVE CV LAB;  Service: Cardiovascular;  Laterality: N/A;  . LITHOTRIPSY    . TEE WITHOUT CARDIOVERSION N/A 05/22/2017   Procedure: TRANSESOPHAGEAL ECHOCARDIOGRAM (TEE);  Surgeon: Delight Ovens, MD;  Location: [facility] OR;  Service: Open Heart Surgery;  Laterality: N/A;  . TONSILLECTOMY      Family History  Problem Relation Age of Onset  . Arthritis Mother   . Arthritis Father   . Heart disease Father   . Stroke Father   . Sudden Cardiac Death Father   . Breast cancer Maternal Aunt     SOCIAL HX: Non-smoker   Current Outpatient Medications:  .  acetaminophen (TYLENOL) 500 MG tablet, Take 1 tablet (500 mg total) by mouth daily as needed for mild pain or headache., Disp: 30 tablet, Rfl: 0 .  albuterol (VENTOLIN HFA) 108 (90 Base) MCG/ACT inhaler, Inhale 2 puffs into the lungs every 6 (six) hours as needed for wheezing or shortness of breath., Disp: 3 g, Rfl: 3 .  amLODipine (NORVASC) 10 MG tablet, Take 1 tablet by mouth once daily, Disp: 90 tablet, Rfl: 3 .  aspirin EC 325 MG EC  tablet, Take 1 tablet (325 mg total) by mouth daily., Disp: 30 tablet, Rfl: 0 .  carvedilol (COREG) 25 MG tablet, Take 1 tablet (25 mg total) by mouth 2 (two) times daily with a meal., Disp: 180 tablet, Rfl: 3 .  fluticasone (FLONASE) 50 MCG/ACT nasal spray, Place 2 sprays into both nostrils daily., Disp: 16 g, Rfl: 6 .  loratadine (CLARITIN) 10 MG tablet, Take 10 mg by mouth daily. , Disp: , Rfl:  .  losartan (COZAAR) 100 MG tablet, Take 1 tablet (100 mg total) by mouth daily., Disp: 90 tablet, Rfl: 1 .  Multiple Vitamins-Minerals (PRESERVISION AREDS 2) CAPS, Take 2 capsules by mouth daily., Disp: , Rfl:  .  rosuvastatin (CRESTOR) 20 MG tablet, Take 1 tablet (20 mg total) by mouth daily., Disp: 90 tablet, Rfl: 3  EXAM:  VITALS per patient if applicable:  GENERAL: alert, oriented, appears well and in no acute distress  HEENT: atraumatic, conjunttiva clear, no obvious abnormalities on inspection of external nose and ears  NECK: normal movements of the head and neck  LUNGS: on inspection no signs of respiratory distress, breathing rate appears normal, no obvious gross SOB, gasping or wheezing  CV: no obvious cyanosis  MS: moves all visible extremities without noticeable abnormality  PSYCH/NEURO: pleasant and cooperative, no obvious depression or anxiety, speech and thought processing grossly intact  ASSESSMENT AND PLAN:  Discussed the following assessment and plan:  CAD (coronary artery disease), native coronary artery Asymptomatic.  She will continue her current regimen.  CHF (congestive heart failure) (HCC) No orthopnea, PND, or edema.  She will continue her current regimen.  Hypertension Uncontrolled.  We will send a message to her nephrologist to get their input regarding the next step in BP management.  She does have an atrophic kidney though her kidney function seems to be relatively stable.  I wonder if we could use spironolactone or HCTZ though I would like to get her  nephrologist input first.  Allergic rhinitis Some mild symptoms.  Discussed consistent use of Flonase.  She will continue Claritin.  Epigastric pain Single episode of this after eating barbecue.  Suspect gastritis.  She will monitor for recurrence.  Chronic fatigue Improved.  Monitor for any additional symptoms.   Message sent to Dr Anthonette Legato regarding the patients BP.  No orders of the defined types were placed in this encounter.   Meds ordered this encounter  Medications  . carvedilol (COREG) 25 MG tablet    Sig: Take 1 tablet (25 mg total) by mouth 2 (two) times daily with a meal.    Dispense:  180 tablet    Refill:  3     I discussed the assessment and treatment plan with the patient. The patient was provided an opportunity to ask questions and all were answered. The patient agreed with the plan and demonstrated an understanding of the instructions.   The patient was advised to call back or seek an in-person evaluation if the symptoms worsen or  if the condition fails to improve as anticipated.   [name], MD

## 2019-02-15 NOTE — Assessment & Plan Note (Signed)
Improved.  Monitor for any additional symptoms.

## 2019-02-16 ENCOUNTER — Telehealth: Payer: Self-pay | Admitting: Family Medicine

## 2019-02-16 MED ORDER — HYDRALAZINE HCL 25 MG PO TABS: 25.0000 mg | ORAL_TABLET | Freq: Three times a day (TID) | ORAL | 1 refills | Status: DC

## 2019-02-16 NOTE — Telephone Encounter (Signed)
I called and informed the patient that the provider sent over a new medication if she has any side effects call ad let us know, if she feels lightheaded let us know and if her BP drops less than 100/60 call and let us know. Patient understood.  Audrey Wolf,cma

## 2019-02-16 NOTE — Telephone Encounter (Signed)
Please let the patient know I heard back from her nephrologist.  He recommended adding on hydralazine for her blood pressure and then seeing how she does on that.  I am going to send this into her pharmacy.  She needs a blood pressure check in the office in 1 week after starting this medication.  If she notices that she starts to get lightheaded with this regimen she needs to let us know.  If she notices her blood pressure drops at home to less than 100/60 she needs to let us know as well.  Thanks.

## 2019-02-16 NOTE — Telephone Encounter (Signed)
-----   Message from [name], MD sent at 02/15/2019 10:19 AM EST ----- Regarding: RE: Patient BP Hey Dr. Sloan Leiter,  thanks for the update.  I'd recommend adding hydralzine 25mg  po TID next and see her response to this.  We could consider HCTZ after that but would like to try hydralazine first.  Thanks,  [place] ----- Message ----- From: , MD Sent: 02/15/2019   9:57 AM EST To: 04/15/2019, MD Subject: Patient BP                                     Hi Dr [name],   I hope you are doing well. I saw Audrey Wolf for follow-up today and her BP remains elevated in to the 160s/80s. It looks like you advised her to increase her losartan to 100 mg on your last visit with her, though she noted she has been on that dose since the middle of 2020. Given her CKD history and atrophic kidney I wanted to check with you on the next step for her BP management. Would spironolactone or HCTZ be options with her kidney issues or should we look at other options? Thank you for your help.  2021

## 2019-02-23 ENCOUNTER — Other Ambulatory Visit: Payer: Self-pay

## 2019-02-23 ENCOUNTER — Ambulatory Visit (INDEPENDENT_AMBULATORY_CARE_PROVIDER_SITE_OTHER): Payer: BC Managed Care – PPO | Admitting: *Deleted

## 2019-02-23 VITALS — BP 160/74 | HR 68 | Temp 97.9°F | Resp 18

## 2019-02-23 DIAGNOSIS — I151 Hypertension secondary to other renal disorders: Secondary | ICD-10-CM | POA: Diagnosis not present

## 2019-02-23 DIAGNOSIS — N2889 Other specified disorders of kidney and ureter: Secondary | ICD-10-CM

## 2019-02-23 NOTE — Progress Notes (Signed)
Patient here for nurse visit BP check per order from 02/16/19  Patient reports compliance with prescribed BP medications: yes, Takes Carvedilol 25 mg , amlodipine 10 mg, losartan 100 mg, hydralazine 25 mg at 10 am and takes carvedilol 25 mg and hydralazine 25 mg again at 6 PM and takes lat dose of hydralazine at 11 pm .  Last dose of BP medication: last dose medication was morning meds at 10  am  BP Readings from Last 3 Encounters:  02/23/19 (!) 168/74  11/12/18 140/70  10/15/18 (!) 158/90   Pulse Readings from Last 3 Encounters:  02/23/19 64  11/12/18 76  10/15/18 67      Patient verbalized understanding of instructions.   Henrene Pastor, RN

## 2019-02-24 NOTE — Progress Notes (Signed)
BP appears uncontrolled.  Can you see what his been running at home if she has been checking it?  If it is not controlled at home we may need to increase the hydralazine again.

## 2019-02-25 NOTE — Progress Notes (Signed)
Left message for patient to return call to office. 

## 2019-02-28 NOTE — Progress Notes (Signed)
Patient ID: Audrey Wolf, female    DOB: 07/12/1950, 69 y.o.   MRN: [id number]  HPI  [name] is a 69 y/o female with a history of asthma, CAD, HTN, CKD, NSTEMI (s/p CABG), GERD and chronic heart failure.  Echo report from 05/22/17 reviewed and showed an EF of 50-55% without any regurgitation.   Cardiac catheterization done 05/18/17 revealed 70% stenosis Ostial LM with recommendation of CABG.   Has not been admitted or been in the ED in the last 6 months.   She presents today for a follow-up visit with a chief complaint of minimal shortness of breath upon moderate exertion. She describes this as chronic in nature having been present for several years. She has associated fatigue, cough, wheezing, palpitations and chronic difficulty sleeping along with this. She denies any dizziness, abdominal distention, pedal edema, chest pain or weight gain.   Admits to being irritated with her BP and the number of medications that she's currently taking. She says that at home the top number is usually between 130-140's and when she goes to work or gets irritated, she can tell that it goes up. She is concerned with the number of medications she is taking with only having 1 kidney and says that she's not going to take anymore.   Past Medical History:  Diagnosis Date  . Arthritis   . Asthma   . CHF (congestive heart failure) (HCC)   . Chronic kidney disease   . Coronary artery disease   . GERD (gastroesophageal reflux disease)   . Headache   . Hypertension   . Nephrolithiasis   . NSTEMI (non-ST elevated myocardial infarction) [facility])    Past Surgical History:  Procedure Laterality Date  . ABDOMINAL HYSTERECTOMY    . APPENDECTOMY    . BREAST BIOPSY Left 12/31/2010   neg  . CORONARY ARTERY BYPASS GRAFT N/A 05/22/2017   Procedure: CORONARY ARTERY BYPASS GRAFTING (CABG) x 2 WITH ENDOSCOPIC HARVESTING OF RIGHT SAPHENOUS VEIN;  Surgeon: Delight Ovens, MD;  Location: [facility] OR;  Service: Open Heart  Surgery;  Laterality: N/A;  . LEFT HEART CATH AND CORONARY ANGIOGRAPHY N/A 05/18/2017   Procedure: LEFT HEART CATH AND CORONARY ANGIOGRAPHY;  Surgeon: Laurier Nancy, MD;  Location: [facility] INVASIVE CV LAB;  Service: Cardiovascular;  Laterality: N/A;  . LITHOTRIPSY    . TEE WITHOUT CARDIOVERSION N/A 05/22/2017   Procedure: TRANSESOPHAGEAL ECHOCARDIOGRAM (TEE);  Surgeon: Delight Ovens, MD;  Location: [facility] OR;  Service: Open Heart Surgery;  Laterality: N/A;  . TONSILLECTOMY     Family History  Problem Relation Age of Onset  . Arthritis Mother   . Arthritis Father   . Heart disease Father   . Stroke Father   . Sudden Cardiac Death Father   . Breast cancer Maternal Aunt    Social History   Tobacco Use  . Smoking status: Never Smoker  . Smokeless tobacco: Never Used  Substance Use Topics  . Alcohol use: No   Allergies  Allergen Reactions  . Lisinopril Cough  . Ace Inhibitors Other (See Comments)    Has been told to avoid these and any diuretics because "left kidney has shrunk up and doesn't work"   Prior to Admission medications   Medication Sig Start Date End Date Taking? Authorizing Provider  acetaminophen (TYLENOL) 500 MG tablet Take 1 tablet (500 mg total) by mouth daily as needed for mild pain or headache. 05/27/17  Yes Doree Fudge M, PA-C  albuterol (VENTOLIN HFA) 108 (  90 Base) MCG/ACT inhaler Inhale 2 puffs into the lungs every 6 (six) hours as needed for wheezing or shortness of breath. 08/31/18  Yes Clarisa Kindred A, FNP  amLODipine (NORVASC) 10 MG tablet Take 1 tablet by mouth once daily 05/30/18  Yes Romani Wilbon A, FNP  aspirin EC 325 MG EC tablet Take 1 tablet (325 mg total) by mouth daily. 05/27/17  Yes Doree Fudge M, PA-C  carvedilol (COREG) 25 MG tablet Take 1 tablet (25 mg total) by mouth 2 (two) times daily with a meal. 02/15/19  Yes Glori Luis, MD  fluticasone (FLONASE) 50 MCG/ACT nasal spray Place 2 sprays into both nostrils daily. 11/10/17  Yes  Glori Luis, MD  hydrALAZINE (APRESOLINE) 25 MG tablet Take 1 tablet (25 mg total) by mouth 3 (three) times daily. 02/16/19  Yes Glori Luis, MD  loratadine (CLARITIN) 10 MG tablet Take 10 mg by mouth daily.    Yes [provider]  losartan (COZAAR) 100 MG tablet Take 1 tablet (100 mg total) by mouth daily. 07/25/18  Yes Glori Luis, MD  Multiple Vitamins-Minerals (PRESERVISION AREDS 2) CAPS Take 2 capsules by mouth daily.   Yes [provider]  rosuvastatin (CRESTOR) 20 MG tablet Take 1 tablet (20 mg total) by mouth daily. 08/31/18  Yes Delma Freeze, FNP    Review of Systems  Constitutional: Positive for fatigue. Negative for appetite change.  HENT: Negative for congestion, postnasal drip and sore throat.   Eyes: Negative.   Respiratory: Positive for cough (dry cough in the mornings), shortness of breath and wheezing (at times). Negative for chest tightness.   Cardiovascular: Positive for palpitations (on occasion). Negative for chest pain and leg swelling.  Gastrointestinal: Negative for abdominal distention and abdominal pain.  Endocrine: Negative.   Genitourinary: Negative.   Musculoskeletal: Negative for arthralgias, back pain and neck pain.  Skin: Negative.   Allergic/Immunologic: Negative.   Neurological: Negative for dizziness and light-headedness.  Hematological: Negative for adenopathy. Does not bruise/bleed easily.  Psychiatric/Behavioral: Positive for sleep disturbance (chronic trouble sleeping).   Vitals:   03/01/19 1009  BP: (!) 183/82  Pulse: 69  Resp: 18  SpO2: 100%  Weight: 227 lb 6.4 oz (103.1 kg)  Height: 5\' 9"  (1.753 m)   Wt Readings from Last 3 Encounters:  03/01/19 227 lb 6.4 oz (103.1 kg)  02/15/19 230 lb (104.3 kg)  11/12/18 227 lb 3.2 oz (103.1 kg)   Lab Results  Component Value Date   CREATININE 1.04 (H) 11/12/2018   CREATININE 0.91 08/27/2018   CREATININE 1.19 08/02/2018    Physical Exam Vitals and nursing  note reviewed.  Constitutional:      Appearance: She is well-developed.  HENT:     Head: Normocephalic and atraumatic.  Neck:     Vascular: No JVD.  Cardiovascular:     Rate and Rhythm: Normal rate and regular rhythm.  Pulmonary:     Effort: Pulmonary effort is normal. No respiratory distress.     Breath sounds: No wheezing or rales.  Abdominal:     General: There is no distension.     Palpations: Abdomen is soft.  Musculoskeletal:        General: No tenderness.     Cervical back: Normal range of motion and neck supple.  Skin:    General: Skin is warm and dry.  Neurological:     Mental Status: She is alert and oriented to person, place, and time.  Psychiatric:  Behavior: Behavior normal.    Assessment & Plan:  1: Chronic heart failure with preserved ejection fraction- - NYHA class II - euvolemic today - weighing daily. Reminded to call for an overnight weight gain of >2 pounds or a weekly weight gain of >5 pounds - weight unchanged from last visit 6 months ago - not adding salt and trying to keep her daily sodium intake to 2000mg  daily - sees cardiology Humphrey Rolls)  - BNP on 05/16/17 was 461.0 - says that she has received her flu and pneumonia vaccines for this season  2: HTN- - BP elevated even after recheck; says that she took her medications prior to coming to the office - is not interested in adding/ changing medications today - saw PCP Caryl Bis) 02/15/19 - BMP on 02/02/2019 reviewed and showed sodium 143, potassium 4.2, creatinine 1.38 and GFR 45  - saw nephrology Holley Raring) 02/02/2019   Patient did not bring her medications nor a list. Each medication was verbally reviewed with the patient and she was encouraged to bring the bottles to every visit to confirm accuracy of list.  Will not make a return appointment for patient at this time as her HF has been quite stable. Advised her that she could call back at anytime to schedule another appointment. Encouraged her  to continue close follow-up with PCP and nephrology regarding her BP. Patient was comfortable with this plan.

## 2019-03-01 ENCOUNTER — Encounter: Payer: Self-pay | Admitting: Family

## 2019-03-01 ENCOUNTER — Ambulatory Visit: Payer: BC Managed Care – PPO | Attending: Family | Admitting: Family

## 2019-03-01 ENCOUNTER — Other Ambulatory Visit: Payer: Self-pay

## 2019-03-01 VITALS — BP 180/80 | HR 69 | Resp 18 | Ht 69.0 in | Wt 227.4 lb

## 2019-03-01 DIAGNOSIS — I130 Hypertensive heart and chronic kidney disease with heart failure and stage 1 through stage 4 chronic kidney disease, or unspecified chronic kidney disease: Secondary | ICD-10-CM | POA: Diagnosis not present

## 2019-03-01 DIAGNOSIS — Z79899 Other long term (current) drug therapy: Secondary | ICD-10-CM | POA: Insufficient documentation

## 2019-03-01 DIAGNOSIS — I5032 Chronic diastolic (congestive) heart failure: Secondary | ICD-10-CM | POA: Diagnosis not present

## 2019-03-01 DIAGNOSIS — N189 Chronic kidney disease, unspecified: Secondary | ICD-10-CM | POA: Insufficient documentation

## 2019-03-01 DIAGNOSIS — Z8249 Family history of ischemic heart disease and other diseases of the circulatory system: Secondary | ICD-10-CM | POA: Insufficient documentation

## 2019-03-01 DIAGNOSIS — R05 Cough: Secondary | ICD-10-CM | POA: Insufficient documentation

## 2019-03-01 DIAGNOSIS — R0602 Shortness of breath: Secondary | ICD-10-CM | POA: Diagnosis not present

## 2019-03-01 DIAGNOSIS — I2510 Atherosclerotic heart disease of native coronary artery without angina pectoris: Secondary | ICD-10-CM | POA: Diagnosis not present

## 2019-03-01 DIAGNOSIS — N2889 Other specified disorders of kidney and ureter: Secondary | ICD-10-CM

## 2019-03-01 DIAGNOSIS — Z951 Presence of aortocoronary bypass graft: Secondary | ICD-10-CM | POA: Diagnosis not present

## 2019-03-01 DIAGNOSIS — I252 Old myocardial infarction: Secondary | ICD-10-CM | POA: Insufficient documentation

## 2019-03-01 DIAGNOSIS — R002 Palpitations: Secondary | ICD-10-CM | POA: Insufficient documentation

## 2019-03-01 DIAGNOSIS — M1990 Unspecified osteoarthritis, unspecified site: Secondary | ICD-10-CM | POA: Insufficient documentation

## 2019-03-01 DIAGNOSIS — G479 Sleep disorder, unspecified: Secondary | ICD-10-CM | POA: Diagnosis not present

## 2019-03-01 DIAGNOSIS — I151 Hypertension secondary to other renal disorders: Secondary | ICD-10-CM

## 2019-03-01 DIAGNOSIS — R5383 Other fatigue: Secondary | ICD-10-CM | POA: Insufficient documentation

## 2019-03-01 DIAGNOSIS — Z7982 Long term (current) use of aspirin: Secondary | ICD-10-CM | POA: Diagnosis not present

## 2019-03-01 DIAGNOSIS — J45909 Unspecified asthma, uncomplicated: Secondary | ICD-10-CM | POA: Insufficient documentation

## 2019-03-01 NOTE — Patient Instructions (Addendum)
Continue weighing daily and call for an overnight weight gain of > 2 pounds or a weekly weight gain of >5 pounds.  Call us at anytime to schedule another appointment 

## 2019-03-24 ENCOUNTER — Encounter: Payer: Self-pay | Admitting: *Deleted

## 2019-03-24 NOTE — Progress Notes (Signed)
Sent message to patient have been unable to reach by phone.

## 2019-04-04 ENCOUNTER — Encounter: Payer: Self-pay | Admitting: *Deleted

## 2019-04-04 NOTE — Progress Notes (Signed)
Patient has seen cardiology but I have not been able to reach patient I have called numerous times left message.

## 2019-04-04 NOTE — Progress Notes (Signed)
Letter mailed to patient asking her to call office

## 2019-04-04 NOTE — Progress Notes (Signed)
I have called patient multiple times since nurse visit and I have sent my Chart message, patient has not answered calls or returned message and has not returned my chart message or letter.

## 2019-04-13 NOTE — Progress Notes (Addendum)
Patient received my letter and returned call today she stated that she had a death in the family besides losing her mother and she had just put off calling. Patient says she does not use my Chart. Patient stated she  Took her BP yesterday and was 158/60 pulse 62. Advised I would give this to PCP and see his advice patient has a follow up scheduled for 05/17/19.

## 2019-04-15 NOTE — Progress Notes (Signed)
The patients diastolic BP precludes changes currently. She needs to monitor her BP and call with her readings in 1-2 weeks to get a better idea of what is going on with her BP on a daily basis.

## 2019-05-17 ENCOUNTER — Ambulatory Visit (INDEPENDENT_AMBULATORY_CARE_PROVIDER_SITE_OTHER): Payer: BC Managed Care – PPO | Admitting: Family Medicine

## 2019-05-17 ENCOUNTER — Other Ambulatory Visit: Payer: Self-pay

## 2019-05-17 ENCOUNTER — Encounter: Payer: Self-pay | Admitting: Family Medicine

## 2019-05-17 VITALS — BP 141/70 | HR 78 | Temp 97.2°F | Ht 69.0 in | Wt 229.6 lb

## 2019-05-17 DIAGNOSIS — I2510 Atherosclerotic heart disease of native coronary artery without angina pectoris: Secondary | ICD-10-CM

## 2019-05-17 DIAGNOSIS — R002 Palpitations: Secondary | ICD-10-CM

## 2019-05-17 DIAGNOSIS — R079 Chest pain, unspecified: Secondary | ICD-10-CM | POA: Diagnosis not present

## 2019-05-17 DIAGNOSIS — E785 Hyperlipidemia, unspecified: Secondary | ICD-10-CM

## 2019-05-17 DIAGNOSIS — N183 Chronic kidney disease, stage 3 unspecified: Secondary | ICD-10-CM | POA: Diagnosis not present

## 2019-05-17 DIAGNOSIS — I10 Essential (primary) hypertension: Secondary | ICD-10-CM

## 2019-05-17 DIAGNOSIS — E669 Obesity, unspecified: Secondary | ICD-10-CM | POA: Diagnosis not present

## 2019-05-17 DIAGNOSIS — E66811 Obesity, class 1: Secondary | ICD-10-CM

## 2019-05-17 LAB — COMPREHENSIVE METABOLIC PANEL
ALT: 21 U/L (ref 0–35)
AST: 19 U/L (ref 0–37)
Albumin: 4.2 g/dL (ref 3.5–5.2)
Alkaline Phosphatase: 79 U/L (ref 39–117)
BUN: 18 mg/dL (ref 6–23)
CO2: 25 mEq/L (ref 19–32)
Calcium: 9.2 mg/dL (ref 8.4–10.5)
Chloride: 107 mEq/L (ref 96–112)
Creatinine, Ser: 0.93 mg/dL (ref 0.40–1.20)
GFR: 59.71 mL/min — ABNORMAL LOW (ref >60.00)
Glucose, Bld: 155 mg/dL — ABNORMAL HIGH (ref 70–99)
Potassium: 3.6 mEq/L (ref 3.5–5.1)
Sodium: 140 mEq/L (ref 135–145)
Total Bilirubin: 0.6 mg/dL (ref 0.2–1.2)
Total Protein: 6.7 g/dL (ref 6.0–8.3)

## 2019-05-17 LAB — LDL CHOLESTEROL, DIRECT: Direct LDL: 64 mg/dL

## 2019-05-17 MED ORDER — ROSUVASTATIN CALCIUM 20 MG PO TABS: 20.0000 mg | ORAL_TABLET | Freq: Every day | ORAL | 3 refills | Status: DC

## 2019-05-17 MED ORDER — AMLODIPINE BESYLATE 10 MG PO TABS: 10.0000 mg | ORAL_TABLET | Freq: Every day | ORAL | 3 refills | Status: DC

## 2019-05-17 NOTE — Assessment & Plan Note (Signed)
She will continue to see her nephrologist and urologist.

## 2019-05-17 NOTE — Assessment & Plan Note (Signed)
Check LDL.  Continue Crestor. 

## 2019-05-17 NOTE — Patient Instructions (Signed)
Nice to see you. We will get labs today. We will get you to see your cardiologist. If you have recurrent or persistent chest pain please seek medical attention.

## 2019-05-17 NOTE — Assessment & Plan Note (Signed)
Control seems to have improved.  She will continue her current regimen.  Check labs.

## 2019-05-17 NOTE — Progress Notes (Signed)
Tommi Rumps, MD Phone: [phone]  Audrey Wolf is a 69 y.o. female who presents today for f/u.  HYPERTENSION  Disease Monitoring  Home BP Monitoring [id number] systolic, excursion to the 180s Chest pain- yes, see below    Dyspnea- no Medications  Compliance-  Taking amlodipine, hydralazine, coreg, losartan.    HYPERLIPIDEMIA Symptoms Chest pain on exertion:  Yes, see below   Medications: Compliance- taking crestor Right upper quadrant pain- no  Muscle aches- no  Chest pain: Patient notes this has occurred on a few occasions.  Notes its like a grabbing pain in her chest.  Typically occurs when she is active at work.  No radiation or diaphoresis.  No shortness of breath.  No issues when she is at home.  She does note quite a bit of stress in the last several months with the loss of her mother and her son's father.  CKD: She follows with nephrology and urology.  Obesity: No dietary changes.  She does not like vegetables or fruits.  She is getting 15,000 steps per day.  While in the office patient felt as though her heart rate went up.  She notes it was 92 on her Fitbit.  She notes this happens occasionally and she feels off when this occurs.  Heart rate and rhythm were normal on auscultation at that time.  Improved symptoms prior to leaving.    Social History   Tobacco Use  Smoking Status Never Smoker  Smokeless Tobacco Never Used     ROS see history of present illness  Objective  Physical Exam Vitals:   05/17/19 1034  BP: (!) 141/70  Pulse: 78  Temp: (!) 97.2 F (36.2 C)  SpO2: 98%    BP Readings from Last 3 Encounters:  05/17/19 (!) 141/70  03/01/19 (!) 180/80  02/23/19 (!) 160/74   Wt Readings from Last 3 Encounters:  05/17/19 229 lb 9.6 oz (104.1 kg)  03/01/19 227 lb 6.4 oz (103.1 kg)  02/15/19 230 lb (104.3 kg)    Physical Exam Constitutional:      General: She is not in acute distress.    Appearance: She is not diaphoretic.    Cardiovascular:     Rate and Rhythm: Normal rate and regular rhythm.     Heart sounds: Normal heart sounds.  Pulmonary:     Effort: Pulmonary effort is normal.     Breath sounds: Normal breath sounds.  Chest:     Chest wall: Tenderness (Left costochondral joints) present.  Musculoskeletal:     Right lower leg: No edema.     Left lower leg: No edema.  Skin:    General: Skin is warm and dry.  Neurological:     Mental Status: She is alert.    EKG: Normal sinus rhythm, rate 79, no acute ischemic changes noted  Assessment/Plan: Please see individual problem list.  Hypertension Control seems to have improved.  She will continue her current regimen.  Check labs.  CAD (coronary artery disease), native coronary artery She does report some chest pain symptoms that seem to occur when she is active.  EKG is reassuring.  Symptoms could be costochondritis related versus cardiac related.  We will have her see her cardiologist for follow-up.  CKD (chronic kidney disease) stage 3, GFR 30-59 ml/min (HCC) She will continue to see her nephrologist and urologist.  Hyperlipidemia Check LDL.  Continue Crestor.  Obesity (BMI 30.0-34.9) Encouraged healthy diet.  Palpitations Episode in the office and return to normal prior to  leaving.  No abnormalities noted on auscultation.  EKG done prior to these symptoms was reassuring.  We will have her see cardiology.   Orders Placed This Encounter  Procedures  . Comp Met (CMET)  . Direct LDL  . Ambulatory referral to Cardiology    Referral Priority:   Routine    Referral Type:   Consultation    Referral Reason:   Specialty Services Required    Requested Specialty:   Cardiology    Number of Visits Requested:   1  . EKG 12-Lead    Meds ordered this encounter  Medications  . rosuvastatin (CRESTOR) 20 MG tablet    Sig: Take 1 tablet (20 mg total) by mouth daily.    Dispense:  90 tablet    Refill:  3  . amLODipine (NORVASC) 10 MG tablet    Sig:  Take 1 tablet (10 mg total) by mouth daily.    Dispense:  90 tablet    Refill:  3    This visit occurred during the SARS-CoV-2 public health emergency.  Safety protocols were in place, including screening questions prior to the visit, additional usage of staff PPE, and extensive cleaning of exam room while observing appropriate contact time as indicated for disinfecting solutions.    Eric Sonnenberg, MD [facility] [place] [facility]  

## 2019-05-17 NOTE — Assessment & Plan Note (Signed)
Encouraged healthy diet. 

## 2019-05-17 NOTE — Assessment & Plan Note (Signed)
She does report some chest pain symptoms that seem to occur when she is active.  EKG is reassuring.  Symptoms could be costochondritis related versus cardiac related.  We will have her see her cardiologist for follow-up.

## 2019-05-17 NOTE — Assessment & Plan Note (Signed)
Episode in the office and return to normal prior to leaving.  No abnormalities noted on auscultation.  EKG done prior to these symptoms was reassuring.  We will have her see cardiology.

## 2019-05-25 ENCOUNTER — Telehealth: Payer: Self-pay | Admitting: Family Medicine

## 2019-05-25 ENCOUNTER — Other Ambulatory Visit: Payer: Self-pay

## 2019-05-25 ENCOUNTER — Ambulatory Visit: Payer: BC Managed Care – PPO | Admitting: Nurse Practitioner

## 2019-05-25 ENCOUNTER — Encounter: Payer: Self-pay | Admitting: Nurse Practitioner

## 2019-05-25 VITALS — BP 144/60 | HR 81 | Temp 98.2°F | Ht 69.0 in | Wt 224.8 lb

## 2019-05-25 DIAGNOSIS — E669 Obesity, unspecified: Secondary | ICD-10-CM

## 2019-05-25 DIAGNOSIS — R7303 Prediabetes: Secondary | ICD-10-CM

## 2019-05-25 DIAGNOSIS — R1013 Epigastric pain: Secondary | ICD-10-CM

## 2019-05-25 DIAGNOSIS — R1011 Right upper quadrant pain: Secondary | ICD-10-CM | POA: Insufficient documentation

## 2019-05-25 DIAGNOSIS — K219 Gastro-esophageal reflux disease without esophagitis: Secondary | ICD-10-CM

## 2019-05-25 HISTORY — DX: Right upper quadrant pain: R10.11

## 2019-05-25 LAB — HEMOGLOBIN A1C: Hgb A1c MFr Bld: 5.6 % (ref 4.6–6.5)

## 2019-05-25 MED ORDER — FAMOTIDINE 20 MG PO TABS: 20.0000 mg | ORAL_TABLET | Freq: Two times a day (BID) | ORAL | 1 refills | Status: DC

## 2019-05-25 NOTE — Progress Notes (Addendum)
Established Patient Office Visit  Subjective:  Patient ID: Audrey Wolf, female    DOB: 1950-07-17  Age: 69 y.o. MRN: [id number]  CC:  Chief Complaint  Patient presents with  . Abdominal Pain    HPI Audrey Wolf is a 69 y.o.with HTN, CHF, CAD, CKD, left side hydronephrosis,  HLD,  Obesity, history of gallstones who presents for " trouble with my stomach" . She prefers the name Audrey Wolf.   Onset, 05/22/19-  Sun morning, she woke up and sat up and said to herself, " I can't do it" and called out of work. She had no pain or symptoms that she could describe. No fever/chills/ aches, sore throat, congestion, CP or cough. She slept off and on during the day, ate regular meals and had no GI symptoms until later in the day. She ate bacon and scrambled eggs, toast with butter at 1730. At 2030, she had cramps in bilateral upper abdomen and urgent non-bloody diarrhea x3-4 and then resolved. She took Pepto-Bismol. She ate food later that eve and had no further symptoms. She has had normal BM since and no further diarrhea or cramps.That was the first diarrhea that she has had in a long time.   She has been experiencing RUQ pressure,  post prandial especially after eating BBQ and bacon and this occurs 1-2  a week for over a year.  No radiation into back or lower abdomen. No association with N/V. Triggers are fatty food and stress at work. She takes an as needed Pepcid for heartburn that started after her CABG in 2019.  She was taken off of 81 mg ASA and laced on 325 mg ASA and has wondered if that is upsetting her stomach. She admits that her diet is poor and she often eats out at restaurants. She does not desire to change her eating habits.  Currently,  she is well and has the RUQ pressure sensation off an on in the abdomen now.  Normal appetite and diet today. She feels well and is working. No hx of screening  Colonoscopy or EGD.     Past Medical History:  Diagnosis Date  . Arthritis   . Asthma     . CHF (congestive heart failure) (HCC)   . Chronic kidney disease   . Coronary artery disease   . GERD (gastroesophageal reflux disease)   . Headache   . Hypertension   . Nephrolithiasis   . NSTEMI (non-ST elevated myocardial infarction) [facility])     Past Surgical History:  Procedure Laterality Date  . ABDOMINAL HYSTERECTOMY    . APPENDECTOMY    . BREAST BIOPSY Left 12/31/2010   neg  . CORONARY ARTERY BYPASS GRAFT N/A 05/22/2017   Procedure: CORONARY ARTERY BYPASS GRAFTING (CABG) x 2 WITH ENDOSCOPIC HARVESTING OF RIGHT SAPHENOUS VEIN;  Surgeon: Delight Ovens, MD;  Location: [facility] OR;  Service: Open Heart Surgery;  Laterality: N/A;  . LEFT HEART CATH AND CORONARY ANGIOGRAPHY N/A 05/18/2017   Procedure: LEFT HEART CATH AND CORONARY ANGIOGRAPHY;  Surgeon: Laurier Nancy, MD;  Location: [facility] INVASIVE CV LAB;  Service: Cardiovascular;  Laterality: N/A;  . LITHOTRIPSY    . TEE WITHOUT CARDIOVERSION N/A 05/22/2017   Procedure: TRANSESOPHAGEAL ECHOCARDIOGRAM (TEE);  Surgeon: Delight Ovens, MD;  Location: [facility] OR;  Service: Open Heart Surgery;  Laterality: N/A;  . TONSILLECTOMY      Family History  Problem Relation Age of Onset  . Arthritis Mother   . Arthritis Father   .  Heart disease Father   . Stroke Father   . Sudden Cardiac Death Father   . Breast cancer Maternal Aunt     Social History   Socioeconomic History  . Marital status: Single    Spouse name: Not on file  . Number of children: Not on file  . Years of education: Not on file  . Highest education level: Not on file  Occupational History    Employer: [employer]  Tobacco Use  . Smoking status: Never Smoker  . Smokeless tobacco: Never Used  Substance and Sexual Activity  . Alcohol use: No  . Drug use: No  . Sexual activity: Never    Partners: Male  Other Topics Concern  . Not on file  Social History Narrative  . Not on file   Social Determinants of Health   Financial Resource Strain:   . Difficulty of  Paying Living Expenses:   Food Insecurity:   . Worried About Programme researcher, broadcasting/film/video in the Last Year:   . Barista in the Last Year:   Transportation Needs:   . Freight forwarder (Medical):   Marland Kitchen Lack of Transportation (Non-Medical):   Physical Activity:   . Days of Exercise per Week:   . Minutes of Exercise per Session:   Stress:   . Feeling of Stress :   Social Connections:   . Frequency of Communication with Friends and Family:   . Frequency of Social Gatherings with Friends and Family:   . Attends Religious Services:   . Active Member of Clubs or Organizations:   . Attends Banker Meetings:   Marland Kitchen Marital Status:   Intimate Partner Violence:   . Fear of Current or Ex-Partner:   . Emotionally Abused:   Marland Kitchen Physically Abused:   . Sexually Abused:     Outpatient Medications Prior to Visit  Medication Sig Dispense Refill  . acetaminophen (TYLENOL) 500 MG tablet Take 1 tablet (500 mg total) by mouth daily as needed for mild pain or headache. 30 tablet 0  . albuterol (VENTOLIN HFA) 108 (90 Base) MCG/ACT inhaler Inhale 2 puffs into the lungs every 6 (six) hours as needed for wheezing or shortness of breath. 3 g 3  . amLODipine (NORVASC) 10 MG tablet Take 1 tablet (10 mg total) by mouth daily. 90 tablet 3  . aspirin EC 325 MG EC tablet Take 1 tablet (325 mg total) by mouth daily. 30 tablet 0  . carvedilol (COREG) 25 MG tablet Take 1 tablet (25 mg total) by mouth 2 (two) times daily with a meal. 180 tablet 3  . fluticasone (FLONASE) 50 MCG/ACT nasal spray Place 2 sprays into both nostrils daily. 16 g 6  . hydrALAZINE (APRESOLINE) 25 MG tablet Take 1 tablet (25 mg total) by mouth 3 (three) times daily. 90 tablet 1  . loratadine (CLARITIN) 10 MG tablet Take 10 mg by mouth daily.     Marland Kitchen losartan (COZAAR) 100 MG tablet Take 1 tablet (100 mg total) by mouth daily. 90 tablet 1  . Multiple Vitamins-Minerals (PRESERVISION AREDS 2) CAPS Take 2 capsules by mouth daily.    .  rosuvastatin (CRESTOR) 20 MG tablet Take 1 tablet (20 mg total) by mouth daily. 90 tablet 3   No facility-administered medications prior to visit.    Allergies  Allergen Reactions  . Lisinopril Cough  . Ace Inhibitors Other (See Comments)    Has been told to avoid these and any diuretics because "left kidney  has shrunk up and doesn't work"     Review of Systems  Constitutional: Negative for chills, fever and unexpected weight change.  HENT: Negative.   Eyes: Negative.   Respiratory: Negative for cough and shortness of breath.   Cardiovascular: Negative for chest pain, palpitations and leg swelling.  Genitourinary: Negative.   Musculoskeletal: Negative.   Skin: Negative.   Neurological: Negative.   Psychiatric/Behavioral: Negative.      Objective:    Physical Exam  Constitutional: She is oriented to person, place, and time. She appears well-developed and well-nourished.  HENT:  Head: Normocephalic and atraumatic.  Cardiovascular: Normal rate and regular rhythm.  Pulmonary/Chest: Effort normal and breath sounds normal.  Abdominal: She exhibits no mass. There is no abdominal tenderness. There is no rebound and no guarding.  Musculoskeletal:        General: Normal range of motion.     Cervical back: Normal range of motion.  Neurological: She is alert and oriented to person, place, and time.  Skin: Skin is warm and dry.  She has a chronic dry skin condition and skin is leathery.  Psychiatric: She has a normal mood and affect. Her behavior is normal.  Vitals reviewed.   BP (!) 144/60   Pulse 81   Temp 98.2 F (36.8 C) (Skin)   Ht 5\' 9"  (1.753 m)   Wt 224 lb 12.8 oz (102 kg)   SpO2 97%   BMI 33.20 kg/m  Wt Readings from Last 3 Encounters:  05/25/19 224 lb 12.8 oz (102 kg)  05/17/19 229 lb 9.6 oz (104.1 kg)  03/01/19 227 lb 6.4 oz (103.1 kg)     Health Maintenance Due  Topic Date Due  . COVID-19 Vaccine (1) Never done    There are no preventive care reminders  to display for this patient.  Lab Results  Component Value Date   TSH 2.90 11/12/2018   Lab Results  Component Value Date   WBC 7.0 11/12/2018   HGB 12.8 11/12/2018   HCT 37.2 11/12/2018   MCV 88.6 11/12/2018   PLT 234 11/12/2018   Lab Results  Component Value Date   NA 140 05/17/2019   K 3.6 05/17/2019   CO2 25 05/17/2019   GLUCOSE 155 (H) 05/17/2019   BUN 18 05/17/2019   CREATININE 0.93 05/17/2019   BILITOT 0.6 05/17/2019   ALKPHOS 79 05/17/2019   AST 19 05/17/2019   ALT 21 05/17/2019   PROT 6.7 05/17/2019   ALBUMIN 4.2 05/17/2019   CALCIUM 9.2 05/17/2019   ANIONGAP 10 05/26/2017   GFR 59.71 (L) 05/17/2019   Lab Results  Component Value Date   CHOL 102 11/12/2018   Lab Results  Component Value Date   HDL 34 (L) 11/12/2018   Lab Results  Component Value Date   LDLCALC 50 11/12/2018   Lab Results  Component Value Date   TRIG 96 11/12/2018   Lab Results  Component Value Date   CHOLHDL 3.0 11/12/2018   Lab Results  Component Value Date   HGBA1C 5.4 11/12/2018   CLINICAL DATA:  Upper abdominal pain  EXAM: ULTRASOUND ABDOMEN LIMITED RIGHT UPPER QUADRANT  COMPARISON:  CT abdomen and pelvis December 28, 2012  FINDINGS: Gallbladder:  Within the gallbladder, there are multiple echogenic foci which move and shadow consistent with cholelithiasis. Largest gallstone measures 1.0 cm in length. There is no appreciable gallbladder wall thickening or pericholecystic fluid. No sonographic Murphy sign noted by sonographer.  Common bile duct:  Diameter: 5 mm. No  intrahepatic or extrahepatic biliary duct dilatation.  Liver:  No focal lesion identified. Within normal limits in parenchymal echogenicity. Portal vein is patent on color Doppler imaging with normal direction of blood flow towards the liver.  IMPRESSION: Cholelithiasis. No appreciable gallbladder wall thickening or pericholecystic fluid.  Study otherwise  unremarkable.   Electronically Signed   By: Bretta Bang III M.D.   On: 12/15/2017 08:59   Assessment & Plan:   Problem List Items Addressed This Visit      Digestive   GERD (gastroesophageal reflux disease)   Relevant Medications   famotidine (PEPCID) 20 MG tablet   Other Relevant Orders   Ambulatory referral to Gastroenterology     Other   Epigastric pain - Primary   Relevant Orders   CBC with Differential/Platelet   US Abdomen Complete   Ambulatory referral to Gastroenterology   Prediabetes   Relevant Orders   HgB A1c   Obesity (BMI 30.0-34.9)    Other Visit Diagnoses    RUQ pain       Relevant Orders   CBC with Differential/Platelet   US Abdomen Complete   Ambulatory referral to Gastroenterology      Meds ordered this encounter  Medications  . famotidine (PEPCID) 20 MG tablet    Sig: Take 1 tablet (20 mg total) by mouth 2 (two) times daily.    Dispense:  60 tablet    Refill:  1    Order Specific Question:   Supervising Provider    Answer:   Dale [place] [[id number]]   DDX: Cholecystitis,  known gallstones (see [place] above 2019), cholangitis, biliary colic, peptic ulcer disease, known GERD, gastritis, doubt pancreatitis.  Food is a trigger for RUQ pressure.  No tenderness or pain with palpation today, neg Murphy sign.  Liver palpated normal.  VSS. No cardiac c/o. No SOB O2 sat 98% and lung fields are clear.  Plan: Recent liver panel is normal.  No sign of hepatitis.  No pain or tenderness suspicious for pancreatitis. Increase Pepcid to BID  for treatment of gastritis and  heartburn. Consider H pylori testing if ABD [place] is negative.    Would she be able to lower her ASA to 81 mg?  Will check with cardiology.  She reports recurrent  Sx for > 1 year. What was new was the diarrhea x3 in a short period of time. Resolved. She also gets period upper abdominal cramps.  Obtain ultrasound to get a look at the gallbladder, bile duct, and liver.  She is followed by  nephrology, will hold on CT at this time since she had no tenderness on exam, normal vital signs. Referral to GI placed for continued work up.   Follow-up: Return in about 1 week (around 06/01/2019).     Amedeo Kinsman, NP

## 2019-05-25 NOTE — Assessment & Plan Note (Signed)
Plan: Recent liver panel is normal.  No sign of hepatitis.  No pain or tenderness suspicious for pancreatitis. Increase Pepcid to BID  for treatment of gastritis and  heartburn. Consider H pylori testing if ABD [place] is negative.    Obtain ultrasound to get a look at the gallbladder, bile ducts, and liver.  She is followed by nephrology, will hold on CT at this time since she had no tenderness on exam, normal vital signs. Referral to GI placed for continued work up.

## 2019-05-25 NOTE — Telephone Encounter (Signed)
LVM that pt needs a 1 week follow up with Amedeo Kinsman.

## 2019-05-25 NOTE — Assessment & Plan Note (Signed)
She is on Pepcid  as needed for heartburn. She gets bilateral upper cramps and believes 325 mg  ASA is bothering her stomach.  She is followed by cardiology.  Increase Pepcid 20 mg to BID> Referral to GI.  

## 2019-05-25 NOTE — Assessment & Plan Note (Signed)
She is on Pepcid  as needed for heartburn. She gets bilateral upper cramps and believes 325 mg  ASA is bothering her stomach.  She is followed by cardiology.  Increase Pepcid 20 mg to BID> Referral to GI.

## 2019-05-25 NOTE — Telephone Encounter (Signed)
I left vm for pt to call ofc to sch [place]. I did not leave reason no name on vm.

## 2019-05-25 NOTE — Patient Instructions (Addendum)
It was nice to meet you today.  Please go to the lab after the visit today.  Begin Pepcid 20 mg and take it twice a day.  If you have heartburn in the evening you may take your Pepcid as needed.   I have ordered abdominal ultrasound to check your liver and gallbladder.  I have put in a referral for gastroenterology.  Carbohydrate Counting for Diabetes Mellitus, Adult  Carbohydrate counting is a method of keeping track of how many carbohydrates you eat. Eating carbohydrates naturally increases the amount of sugar (glucose) in the blood. Counting how many carbohydrates you eat helps keep your blood glucose within normal limits, which helps you manage your diabetes (diabetes mellitus). It is important to know how many carbohydrates you can safely have in each meal. This is different for every person. A diet and nutrition specialist (registered dietitian) can help you make a meal plan and calculate how many carbohydrates you should have at each meal and snack. Carbohydrates are found in the following foods:  Grains, such as breads and cereals.  Dried beans and soy products.  Starchy vegetables, such as potatoes, peas, and corn.  Fruit and fruit juices.  Milk and yogurt.  Sweets and snack foods, such as cake, cookies, candy, chips, and soft drinks. How do I count carbohydrates? There are two ways to count carbohydrates in food. You can use either of the methods or a combination of both. Reading "Nutrition Facts" on packaged food The "Nutrition Facts" list is included on the labels of almost all packaged foods and beverages in the [place] It includes:  The serving size.  Information about nutrients in each serving, including the grams (g) of carbohydrate per serving. To use the "Nutrition Facts":  Decide how many servings you will have.  Multiply the number of servings by the number of carbohydrates per serving.  The resulting number is the total amount of carbohydrates that you will  be having. Learning standard serving sizes of other foods When you eat carbohydrate foods that are not packaged or do not include "Nutrition Facts" on the label, you need to measure the servings in order to count the amount of carbohydrates:  Measure the foods that you will eat with a food scale or measuring cup, if needed.  Decide how many standard-size servings you will eat.  Multiply the number of servings by 15. Most carbohydrate-rich foods have about 15 g of carbohydrates per serving. ? For example, if you eat 8 oz (170 g) of strawberries, you will have eaten 2 servings and 30 g of carbohydrates (2 servings x 15 g = 30 g).  For foods that have more than one food mixed, such as soups and casseroles, you must count the carbohydrates in each food that is included. The following list contains standard serving sizes of common carbohydrate-rich foods. Each of these servings has about 15 g of carbohydrates:   hamburger bun or  English muffin.   oz (15 mL) syrup.   oz (14 g) jelly.  1 slice of bread.  1 six-inch tortilla.  3 oz (85 g) cooked rice or pasta.  4 oz (113 g) cooked dried beans.  4 oz (113 g) starchy vegetable, such as peas, corn, or potatoes.  4 oz (113 g) hot cereal.  4 oz (113 g) mashed potatoes or  of a large baked potato.  4 oz (113 g) canned or frozen fruit.  4 oz (120 mL) fruit juice.  4-6 crackers.  6 chicken nuggets.  6 oz (170 g) unsweetened dry cereal.  6 oz (170 g) plain fat-free yogurt or yogurt sweetened with artificial sweeteners.  8 oz (240 mL) milk.  8 oz (170 g) fresh fruit or one small piece of fruit.  24 oz (680 g) popped popcorn. Example of carbohydrate counting Sample meal  3 oz (85 g) chicken breast.  6 oz (170 g) brown rice.  4 oz (113 g) corn.  8 oz (240 mL) milk.  8 oz (170 g) strawberries with sugar-free whipped topping. Carbohydrate calculation 1. Identify the foods that contain  carbohydrates: ? Rice. ? Corn. ? Milk. ? Strawberries. 2. Calculate how many servings you have of each food: ? 2 servings rice. ? 1 serving corn. ? 1 serving milk. ? 1 serving strawberries. 3. Multiply each number of servings by 15 g: ? 2 servings rice x 15 g = 30 g. ? 1 serving corn x 15 g = 15 g. ? 1 serving milk x 15 g = 15 g. ? 1 serving strawberries x 15 g = 15 g. 4. Add together all of the amounts to find the total grams of carbohydrates eaten: ? 30 g + 15 g + 15 g + 15 g = 75 g of carbohydrates total. Summary  Carbohydrate counting is a method of keeping track of how many carbohydrates you eat.  Eating carbohydrates naturally increases the amount of sugar (glucose) in the blood.  Counting how many carbohydrates you eat helps keep your blood glucose within normal limits, which helps you manage your diabetes.  A diet and nutrition specialist (registered dietitian) can help you make a meal plan and calculate how many carbohydrates you should have at each meal and snack. This information is not intended to replace advice given to you by your health care provider. Make sure you discuss any questions you have with your health care provider. Document Revised: 08/14/2016 Document Reviewed: 07/04/2015 Elsevier Patient Education  2020 [employer]  Heartburn Heartburn is a type of pain or discomfort that can happen in the throat or chest. It is often described as a burning pain. It may also cause a bad, acid-like taste in the mouth. Heartburn may feel worse when you lie down or bend over, and it is often worse at night. Heartburn may be caused by stomach contents that move back up into the esophagus (reflux). Follow these instructions at home: Eating and drinking   Avoid certain foods and drinks as told by your health care provider. This may include: ? Coffee and tea (with or without caffeine). ? Drinks that contain alcohol. ? Energy drinks and sports drinks. ? Carbonated  drinks or sodas. ? Chocolate and cocoa. ? Peppermint and mint flavorings. ? Garlic and onions. ? Horseradish. ? Spicy and acidic foods, including peppers, chili powder, curry powder, vinegar, hot sauces, and barbecue sauce. ? Citrus fruit juices and citrus fruits, such as oranges, lemons, and limes. ? Tomato-based foods, such as red sauce, chili, salsa, and pizza with red sauce. ? Fried and fatty foods, such as donuts, french fries, potato chips, and high-fat dressings. ? High-fat meats, such as hot dogs and fatty cuts of red and white meats, such as rib eye steak, sausage, ham, and bacon. ? High-fat dairy items, such as whole milk, butter, and cream cheese.  Eat small, frequent meals instead of large meals.  Avoid drinking large amounts of liquid with your meals.  Avoid eating meals during the 2-3 hours before bedtime.  Avoid lying down right after  you eat.  Do not exercise right after you eat. Lifestyle      If you are overweight, reduce your weight to an amount that is healthy for you. Ask your health care provider for guidance about a safe weight loss goal.  Do not use any products that contain nicotine or tobacco, such as cigarettes, e-cigarettes, and chewing tobacco. These can make your symptoms worse. If you need help quitting, ask your health care provider.  Wear loose-fitting clothing. Do not wear anything tight around your waist that causes pressure on your abdomen.  Raise (elevate) the head of your bed about 6 inches (15 cm) when you sleep.  Try to reduce your stress, such as with yoga or meditation. If you need help reducing stress, ask your health care provider. General instructions  Pay attention to any changes in your symptoms.  Take over-the-counter and prescription medicines only as told by your health care provider. ? Do not take aspirin, ibuprofen, or other NSAIDs unless your health care provider told you to do so. ? Stop medicines only as told by your  health care provider. If you stop taking some medicines too quickly, your symptoms may get worse.  Keep all follow-up visits as told by your health care provider. This is important. Contact a health care provider if:  You have new symptoms.  You have unexplained weight loss.  You have difficulty swallowing, or it hurts to swallow.  You have wheezing or a persistent cough.  Your symptoms do not improve with treatment.  You have frequent heartburn for more than 2 weeks. Get help right away if:  You have pain in your arms, neck, jaw, teeth, or back.  You feel sweaty, dizzy, or light-headed.  You have chest pain or shortness of breath.  You vomit and your vomit looks like blood or coffee grounds.  Your stool is bloody or black. These symptoms may represent a serious problem that is an emergency. Do not wait to see if the symptoms will go away. Get medical help right away. Call your local emergency services (911 in the [place] Do not drive yourself to the hospital. Summary  Heartburn is a type of pain or discomfort that can happen in the throat or chest. It is often described as a burning pain. It may also cause a bad, acid-like taste in the mouth.  Avoid certain foods and drinks as told by your health care provider.  Take over-the-counter and prescription medicines only as told by your health care provider. Do not take aspirin, ibuprofen, or other NSAIDs unless your health care provider told you to do so.  Contact a health care provider if your symptoms do not improve or they get worse. This information is not intended to replace advice given to you by your health care provider. Make sure you discuss any questions you have with your health care provider. Document Revised: 06/22/2017 Document Reviewed: 06/22/2017 Elsevier Patient Education  2020 [employer].

## 2019-05-25 NOTE — Progress Notes (Signed)
Pre visit review using our clinic review tool, if applicable. No additional management support is needed unless otherwise documented below in the visit note. 

## 2019-05-26 ENCOUNTER — Telehealth: Payer: Self-pay | Admitting: Family Medicine

## 2019-05-26 LAB — CBC WITH DIFFERENTIAL/PLATELET
Basophils Absolute: 0 10*3/uL (ref 0.0–0.1)
Basophils Relative: 0.6 % (ref 0.0–3.0)
Eosinophils Absolute: 0.1 10*3/uL (ref 0.0–0.7)
Eosinophils Relative: 1 % (ref 0.0–5.0)
HCT: 35 % — ABNORMAL LOW (ref 36.0–46.0)
Hemoglobin: 11.9 g/dL — ABNORMAL LOW (ref 12.0–15.0)
Lymphocytes Relative: 27.7 % (ref 12.0–46.0)
Lymphs Abs: 1.9 10*3/uL (ref 0.7–4.0)
MCHC: 33.9 g/dL (ref 30.0–36.0)
MCV: 92.3 fl (ref 78.0–100.0)
Monocytes Absolute: 0.6 10*3/uL (ref 0.1–1.0)
Monocytes Relative: 8.4 % (ref 3.0–12.0)
Neutro Abs: 4.2 10*3/uL (ref 1.4–7.7)
Neutrophils Relative %: 62.3 % (ref 43.0–77.0)
Platelets: 202 10*3/uL (ref 150.0–400.0)
RBC: 3.8 Mil/uL — ABNORMAL LOW (ref 3.87–5.11)
RDW: 13.6 % (ref 11.5–15.5)
WBC: 6.8 10*3/uL (ref 4.0–10.5)

## 2019-05-26 NOTE — Telephone Encounter (Signed)
I left vm for pt to call ofc to [facility]. 

## 2019-05-27 ENCOUNTER — Telehealth: Payer: Self-pay

## 2019-05-27 NOTE — Telephone Encounter (Signed)
LMTCB for lab results.  

## 2019-05-30 ENCOUNTER — Telehealth: Payer: Self-pay | Admitting: Family Medicine

## 2019-05-30 NOTE — Telephone Encounter (Signed)
LM once again for nurse/CMA at cardiologist Dr. Santo Held office please call me back.

## 2019-05-30 NOTE — Telephone Encounter (Signed)
[facility] in [place] was returning your call regarding this patient. They said you can call them back @ [phone].

## 2019-05-31 NOTE — Telephone Encounter (Signed)
Seward Meth, FNP-C from [facility] called back & he stated that Dr. Park Breed did recommend that patient stay on 325 ASA due to her patient refusing plavix plus hx of coronary artery bypass surgery. Also he said that patient had not been seen in over 6 months & was due for follow-up.

## 2019-05-31 NOTE — Telephone Encounter (Signed)
Sorry this was supposed to go to [facility] [employer]!

## 2019-06-03 ENCOUNTER — Ambulatory Visit: Payer: Medicare Other | Admitting: Nurse Practitioner

## 2019-06-03 ENCOUNTER — Ambulatory Visit
Admission: RE | Admit: 2019-06-03 | Discharge: 2019-06-03 | Disposition: A | Discharge status: Home / Self Care | Payer: BC Managed Care – PPO | Source: Ambulatory Visit | Attending: Nurse Practitioner | Admitting: Nurse Practitioner

## 2019-06-03 ENCOUNTER — Encounter: Payer: Self-pay | Admitting: Nurse Practitioner

## 2019-06-03 ENCOUNTER — Other Ambulatory Visit: Payer: Self-pay

## 2019-06-03 VITALS — BP 138/70 | HR 76 | Temp 97.7°F | Ht 69.0 in | Wt 227.4 lb

## 2019-06-03 DIAGNOSIS — K219 Gastro-esophageal reflux disease without esophagitis: Secondary | ICD-10-CM

## 2019-06-03 DIAGNOSIS — K8020 Calculus of gallbladder without cholecystitis without obstruction: Secondary | ICD-10-CM | POA: Diagnosis not present

## 2019-06-03 DIAGNOSIS — K824 Cholesterolosis of gallbladder: Secondary | ICD-10-CM

## 2019-06-03 DIAGNOSIS — R1013 Epigastric pain: Secondary | ICD-10-CM | POA: Insufficient documentation

## 2019-06-03 DIAGNOSIS — R1011 Right upper quadrant pain: Secondary | ICD-10-CM | POA: Diagnosis not present

## 2019-06-03 NOTE — Patient Instructions (Addendum)
I will send in a referral to general surgeon about gallbladder polyp and gallstones with periodic right upper abdominal pain.   Try to eat healthier! See below.   Stay on the Pepcid daily since you must take the full strength aspirin per your heart doctor.   Calorie Counting for Weight Loss Calories are units of energy. Your body needs a certain amount of calories from food to keep you going throughout the day. When you eat more calories than your body needs, your body stores the extra calories as fat. When you eat fewer calories than your body needs, your body burns fat to get the energy it needs. Calorie counting means keeping track of how many calories you eat and drink each day. Calorie counting can be helpful if you need to lose weight. If you make sure to eat fewer calories than your body needs, you should lose weight. Ask your health care provider what a healthy weight is for you. For calorie counting to work, you will need to eat the right number of calories in a day in order to lose a healthy amount of weight per week. A dietitian can help you determine how many calories you need in a day and will give you suggestions on how to reach your calorie goal.  A healthy amount of weight to lose per week is usually 1-2 lb (0.5-0.9 kg). This usually means that your daily calorie intake should be reduced by 500-750 calories.  Eating 1,200 - 1,500 calories per day can help most women lose weight.  Eating 1,500 - 1,800 calories per day can help most men lose weight. What is my plan? My goal is to have __________ calories per day. If I have this many calories per day, I should lose around __________ pounds per week. What do I need to know about calorie counting? In order to meet your daily calorie goal, you will need to:  Find out how many calories are in each food you would like to eat. Try to do this before you eat.  Decide how much of the food you plan to eat.  Write down what you ate and  how many calories it had. Doing this is called keeping a food log. To successfully lose weight, it is important to balance calorie counting with a healthy lifestyle that includes regular activity. Aim for 150 minutes of moderate exercise (such as walking) or 75 minutes of vigorous exercise (such as running) each week. Where do I find calorie information?  The number of calories in a food can be found on a Nutrition Facts label. If a food does not have a Nutrition Facts label, try to look up the calories online or ask your dietitian for help. Remember that calories are listed per serving. If you choose to have more than one serving of a food, you will have to multiply the calories per serving by the amount of servings you plan to eat. For example, the label on a package of bread might say that a serving size is 1 slice and that there are 90 calories in a serving. If you eat 1 slice, you will have eaten 90 calories. If you eat 2 slices, you will have eaten 180 calories. How do I keep a food log? Immediately after each meal, record the following information in your food log:  What you ate. Don't forget to include toppings, sauces, and other extras on the food.  How much you ate. This can be measured in cups,  ounces, or number of items.  How many calories each food and drink had.  The total number of calories in the meal. Keep your food log near you, such as in a small notebook in your pocket, or use a mobile app or website. Some programs will calculate calories for you and show you how many calories you have left for the day to meet your goal. What are some calorie counting tips?   Use your calories on foods and drinks that will fill you up and not leave you hungry: ? Some examples of foods that fill you up are nuts and nut butters, vegetables, lean proteins, and high-fiber foods like whole grains. High-fiber foods are foods with more than 5 g fiber per serving. ? Drinks such as sodas, specialty  coffee drinks, alcohol, and juices have a lot of calories, yet do not fill you up.  Eat nutritious foods and avoid empty calories. Empty calories are calories you get from foods or beverages that do not have many vitamins or protein, such as candy, sweets, and soda. It is better to have a nutritious high-calorie food (such as an avocado) than a food with few nutrients (such as a bag of chips).  Know how many calories are in the foods you eat most often. This will help you calculate calorie counts faster.  Pay attention to calories in drinks. Low-calorie drinks include water and unsweetened drinks.  Pay attention to nutrition labels for "low fat" or "fat free" foods. These foods sometimes have the same amount of calories or more calories than the full fat versions. They also often have added sugar, starch, or salt, to make up for flavor that was removed with the fat.  Find a way of tracking calories that works for you. Get creative. Try different apps or programs if writing down calories does not work for you. What are some portion control tips?  Know how many calories are in a serving. This will help you know how many servings of a certain food you can have.  Use a measuring cup to measure serving sizes. You could also try weighing out portions on a kitchen scale. With time, you will be able to estimate serving sizes for some foods.  Take some time to put servings of different foods on your favorite plates, bowls, and cups so you know what a serving looks like.  Try not to eat straight from a bag or box. Doing this can lead to overeating. Put the amount you would like to eat in a cup or on a plate to make sure you are eating the right portion.  Use smaller plates, glasses, and bowls to prevent overeating.  Try not to multitask (for example, watch TV or use your computer) while eating. If it is time to eat, sit down at a table and enjoy your food. This will help you to know when you are full.  It will also help you to be aware of what you are eating and how much you are eating. What are tips for following this plan? Reading food labels  Check the calorie count compared to the serving size. The serving size may be smaller than what you are used to eating.  Check the source of the calories. Make sure the food you are eating is high in vitamins and protein and low in saturated and trans fats. Shopping  Read nutrition labels while you shop. This will help you make healthy decisions before you decide to purchase your  food.  Make a grocery list and stick to it. Cooking  Try to cook your favorite foods in a healthier way. For example, try baking instead of frying.  Use low-fat dairy products. Meal planning  Use more fruits and vegetables. Half of your plate should be fruits and vegetables.  Include lean proteins like poultry and fish. How do I count calories when eating out?  Ask for smaller portion sizes.  Consider sharing an entree and sides instead of getting your own entree.  If you get your own entree, eat only half. Ask for a box at the beginning of your meal and put the rest of your entree in it so you are not tempted to eat it.  If calories are listed on the menu, choose the lower calorie options.  Choose dishes that include vegetables, fruits, whole grains, low-fat dairy products, and lean protein.  Choose items that are boiled, broiled, grilled, or steamed. Stay away from items that are buttered, battered, fried, or served with cream sauce. Items labeled "crispy" are usually fried, unless stated otherwise.  Choose water, low-fat milk, unsweetened iced tea, or other drinks without added sugar. If you want an alcoholic beverage, choose a lower calorie option such as a glass of wine or light beer.  Ask for dressings, sauces, and syrups on the side. These are usually high in calories, so you should limit the amount you eat.  If you want a salad, choose a garden salad  and ask for grilled meats. Avoid extra toppings like bacon, cheese, or fried items. Ask for the dressing on the side, or ask for olive oil and vinegar or lemon to use as dressing.  Estimate how many servings of a food you are given. For example, a serving of cooked rice is  cup or about the size of half a baseball. Knowing serving sizes will help you be aware of how much food you are eating at restaurants. The list below tells you how big or small some common portion sizes are based on everyday objects: ? 1 oz-4 stacked dice. ? 3 oz-1 deck of cards. ? 1 tsp-1 die. ? 1 Tbsp- a ping-pong ball. ? 2 Tbsp-1 ping-pong ball. ?  cup- baseball. ? 1 cup-1 baseball. Summary  Calorie counting means keeping track of how many calories you eat and drink each day. If you eat fewer calories than your body needs, you should lose weight.  A healthy amount of weight to lose per week is usually 1-2 lb (0.5-0.9 kg). This usually means reducing your daily calorie intake by 500-750 calories.  The number of calories in a food can be found on a Nutrition Facts label. If a food does not have a Nutrition Facts label, try to look up the calories online or ask your dietitian for help.  Use your calories on foods and drinks that will fill you up, and not on foods and drinks that will leave you hungry.  Use smaller plates, glasses, and bowls to prevent overeating. This information is not intended to replace advice given to you by your health care provider. Make sure you discuss any questions you have with your health care provider. Document Revised: 10/09/2017 Document Reviewed: 12/21/2015 Elsevier Patient Education  2020 [employer]  Obesity, Adult Obesity is having too much body fat. Being obese means that your weight is more than what is healthy for you. BMI is a number that explains how much body fat you have. If you have a BMI of 30  or more, you are obese. Obesity is often caused by eating or drinking more  calories than your body uses. Changing your lifestyle can help you lose weight. Obesity can cause serious health problems, such as:  Stroke.  Coronary artery disease (CAD).  Type 2 diabetes.  Some types of cancer, including cancers of the colon, breast, uterus, and gallbladder.  Osteoarthritis.  High blood pressure (hypertension).  High cholesterol.  Sleep apnea.  Gallbladder stones.  Infertility problems. What are the causes?  Eating meals each day that are high in calories, sugar, and fat.  Being born with genes that may make you more likely to become obese.  Having a medical condition that causes obesity.  Taking certain medicines.  Sitting a lot (having a sedentary lifestyle).  Not getting enough sleep.  Drinking a lot of drinks that have sugar in them. What increases the risk?  Having a family history of obesity.  Being an [place] American woman.  Being a Hispanic man.  Living in an area with limited access to: ? [facility], recreation centers, or sidewalks. ? Healthy food choices, such as grocery stores and farmers' markets. What are the signs or symptoms? The main sign is having too much body fat. How is this treated?  Treatment for this condition often includes changing your lifestyle. Treatment may include: ? Changing your diet. This may include making a healthy meal plan. ? Exercise. This may include activity that causes your heart to beat faster (aerobic exercise) and strength training. Work with your doctor to design a program that works for you. ? Medicine to help you lose weight. This may be used if you are not able to lose 1 pound a week after 6 weeks of healthy eating and more exercise. ? Treating conditions that cause the obesity. ? Surgery. Options may include gastric banding and gastric bypass. This may be done if:  Other treatments have not helped to improve your condition.  You have a BMI of 40 or higher.  You have life-threatening health  problems related to obesity. Follow these instructions at home: Eating and drinking   Follow advice from your doctor about what to eat and drink. Your doctor may tell you to: ? Limit fast food, sweets, and processed snack foods. ? Choose low-fat options. For example, choose low-fat milk instead of whole milk. ? Eat 5 or more servings of fruits or vegetables each day. ? Eat at home more often. This gives you more control over what you eat. ? Choose healthy foods when you eat out. ? Learn to read food labels. This will help you learn how much food is in 1 serving. ? Keep low-fat snacks available. ? Avoid drinks that have a lot of sugar in them. These include soda, fruit juice, iced tea with sugar, and flavored milk.  Drink enough water to keep your pee (urine) pale yellow.  Do not go on fad diets. Physical activity  Exercise often, as told by your doctor. Most adults should get up to 150 minutes of moderate-intensity exercise every week.Ask your doctor: ? What types of exercise are safe for you. ? How often you should exercise.  Warm up and stretch before being active.  Do slow stretching after being active (cool down).  Rest between times of being active. Lifestyle  Work with your doctor and a food expert (dietitian) to set a weight-loss goal that is best for you.  Limit your screen time.  Find ways to reward yourself that do not involve food.  Do not drink alcohol if: ? Your doctor tells you not to drink. ? You are pregnant, may be pregnant, or are planning to become pregnant.  If you drink alcohol: ? Limit how much you use to:  0-1 drink a day for women.  0-2 drinks a day for men. ? Be aware of how much alcohol is in your drink. In the [place] one drink equals one 12 oz bottle of beer (355 mL), one 5 oz glass of wine (148 mL), or one 1 oz glass of hard liquor (44 mL). General instructions  Keep a weight-loss journal. This can help you keep track of: ? The food that  you eat. ? How much exercise you get.  Take over-the-counter and prescription medicines only as told by your doctor.  Take vitamins and supplements only as told by your doctor.  Think about joining a support group.  Keep all follow-up visits as told by your doctor. This is important. Contact a doctor if:  You cannot meet your weight loss goal after you have changed your diet and lifestyle for 6 weeks. Get help right away if you:  Are having trouble breathing.  Are having thoughts of harming yourself. Summary  Obesity is having too much body fat.  Being obese means that your weight is more than what is healthy for you.  Work with your doctor to set a weight-loss goal.  Get regular exercise as told by your doctor. This information is not intended to replace advice given to you by your health care provider. Make sure you discuss any questions you have with your health care provider. Document Revised: 09/24/2017 Document Reviewed: 09/24/2017 Elsevier Patient Education  2020 [employer].  Cholelithiasis  Cholelithiasis is also called "gallstones." It is a kind of gallbladder disease. The gallbladder is an organ that stores a liquid (bile) that helps you digest fat. Gallstones may not cause symptoms (may be silent gallstones) until they cause a blockage, and then they can cause pain (gallbladder attack). Follow these instructions at home:  Take over-the-counter and prescription medicines only as told by your doctor.  Stay at a healthy weight.  Eat healthy foods. This includes: ? Eating fewer fatty foods, like fried foods. ? Eating fewer refined carbs (refined carbohydrates). Refined carbs are breads and grains that are highly processed, like white bread and white rice. Instead, choose whole grains like whole-wheat bread and brown rice. ? Eating more fiber. Almonds, fresh fruit, and beans are healthy sources of fiber.  Keep all follow-up visits as told by your doctor. This is  important. Contact a doctor if:  You have sudden pain in the upper right side of your belly (abdomen). Pain might spread to your right shoulder or your chest. This may be a sign of a gallbladder attack.  You feel sick to your stomach (are nauseous).  You throw up (vomit).  You have been diagnosed with gallstones that have no symptoms and you get: ? Belly pain. ? Discomfort, burning, or fullness in the upper part of your belly (indigestion). Get help right away if:  You have sudden pain in the upper right side of your belly, and it lasts for more than 2 hours.  You have belly pain that lasts for more than 5 hours.  You have a fever or chills.  You keep feeling sick to your stomach or you keep throwing up.  Your skin or the whites of your eyes turn yellow (jaundice).  You have dark-colored pee (urine).  You  have light-colored poop (stool). Summary  Cholelithiasis is also called "gallstones."  The gallbladder is an organ that stores a liquid (bile) that helps you digest fat.  Silent gallstones are gallstones that do not cause symptoms.  A gallbladder attack may cause sudden pain in the upper right side of your belly. Pain might spread to your right shoulder or your chest. If this happens, contact your doctor.  If you have sudden pain in the upper right side of your belly that lasts for more than 2 hours, get help right away. This information is not intended to replace advice given to you by your health care provider. Make sure you discuss any questions you have with your health care provider. Document Revised: 01/02/2017 Document Reviewed: 10/07/2015 Elsevier Patient Education  2020 [employer].

## 2019-06-03 NOTE — Progress Notes (Signed)
Established Patient Office Visit  Subjective:  Patient ID: Audrey Wolf, female    DOB: Jul 17, 1950  Age: 69 y.o. MRN: [id number]  CC:  Chief Complaint  Patient presents with  . Follow-up    HPI Audrey Wolf is a 69 y.o.with HTN, CHF, CAD, CKD, left side hydronephrosis,  HLD,  obesity, history of gallstones who presents for a one week follow up of RUQ/epigastric intermittent pain 1-2 times per week over the last year. This is usually a few hours after eating a high fat meal or having stress at work. She had an isolated episode of diarrhea recently. She has known gallstones. A GI referral was recommended, but it is not scheduled. She has never had an EGD/colonoscopy. She started taking Pepcid 20 mg once daily after her CABG in 2019. She is on a 325 mg ASA and per her cardiologist, Dr.Kahn, she must stay on this dose because the patient refused Plavix.    Since her visit on 05/25/2019, her stomach pain resolved and has not returned.  She has had no further diarrhea.  Bowel habits are normal.  She has noted no blood or melena in stool.  She ate sausage, gravy and eggs this morning and her stomach feels fine. She has no nausea/vomiting. Her liver panel is normal. Normocytic anemia.  The abdominal ultrasound revealed gallstones noted within the gallbladder, the largest 9 mm.  There is a 6 mm anterior gallbladder wall polyp.  No wall thickening or sonographic Murphy sign.  Common bile duct normal caliber at 6 mm.  The liver showed no focal lesion identified, within normal limits parenchymal echogenicity.  Portal vein is patent on color Doppler imaging with normal directional blood flow towards the liver.  No sonographic evidence of acute cholecystitis.  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   Past Medical History:  Diagnosis Date  . Arthritis   . Asthma   . CHF (congestive heart failure) (HCC)   . Chronic kidney disease   . Coronary artery  disease   . GERD (gastroesophageal reflux disease)   . Headache   . Hypertension   . Nephrolithiasis   . NSTEMI (non-ST elevated myocardial infarction) (HCC)   . Postprandial RUQ pain 05/25/2019    Past Surgical History:  Procedure Laterality Date  . ABDOMINAL HYSTERECTOMY    . APPENDECTOMY    . BREAST BIOPSY Left 12/31/2010   neg  . CORONARY ARTERY BYPASS GRAFT N/A 05/22/2017   Procedure: CORONARY ARTERY BYPASS GRAFTING (CABG) x 2 WITH ENDOSCOPIC HARVESTING OF RIGHT SAPHENOUS VEIN;  Surgeon: Delight Ovens, MD;  Location: [facility] OR;  Service: Open Heart Surgery;  Laterality: N/A;  . LEFT HEART CATH AND CORONARY ANGIOGRAPHY N/A 05/18/2017   Procedure: LEFT HEART CATH AND CORONARY ANGIOGRAPHY;  Surgeon: Laurier Nancy, MD;  Location: [facility] INVASIVE CV LAB;  Service: Cardiovascular;  Laterality: N/A;  . LITHOTRIPSY    . TEE WITHOUT CARDIOVERSION N/A 05/22/2017   Procedure: TRANSESOPHAGEAL ECHOCARDIOGRAM (TEE);  Surgeon: Delight Ovens, MD;  Location: [facility] OR;  Service: Open Heart Surgery;  Laterality: N/A;  . TONSILLECTOMY      Family History  Problem Relation Age of Onset  . Arthritis Mother   . Arthritis Father   . Heart disease Father   . Stroke Father   . Sudden Cardiac Death Father   . Breast cancer Maternal Aunt     Social History   Socioeconomic History  . Marital status: Single  Spouse name: Not on file  . Number of children: Not on file  . Years of education: Not on file  . Highest education level: Not on file  Occupational History    Employer: [employer]  Tobacco Use  . Smoking status: Never Smoker  . Smokeless tobacco: Never Used  Substance and Sexual Activity  . Alcohol use: No  . Drug use: No  . Sexual activity: Never    Partners: Male  Other Topics Concern  . Not on file  Social History Narrative  . Not on file   Social Determinants of Health   Financial Resource Strain:   . Difficulty of Paying Living Expenses:   Food Insecurity:   . Worried  About Programme researcher, broadcasting/film/video in the Last Year:   . Barista in the Last Year:   Transportation Needs:   . Freight forwarder (Medical):   Marland Kitchen Lack of Transportation (Non-Medical):   Physical Activity:   . Days of Exercise per Week:   . Minutes of Exercise per Session:   Stress:   . Feeling of Stress :   Social Connections:   . Frequency of Communication with Friends and Family:   . Frequency of Social Gatherings with Friends and Family:   . Attends Religious Services:   . Active Member of Clubs or Organizations:   . Attends Banker Meetings:   Marland Kitchen Marital Status:   Intimate Partner Violence:   . Fear of Current or Ex-Partner:   . Emotionally Abused:   Marland Kitchen Physically Abused:   . Sexually Abused:     Outpatient Medications Prior to Visit  Medication Sig Dispense Refill  . acetaminophen (TYLENOL) 500 MG tablet Take 1 tablet (500 mg total) by mouth daily as needed for mild pain or headache. 30 tablet 0  . albuterol (VENTOLIN HFA) 108 (90 Base) MCG/ACT inhaler Inhale 2 puffs into the lungs every 6 (six) hours as needed for wheezing or shortness of breath. 3 g 3  . amLODipine (NORVASC) 10 MG tablet Take 1 tablet (10 mg total) by mouth daily. 90 tablet 3  . aspirin EC 325 MG EC tablet Take 1 tablet (325 mg total) by mouth daily. 30 tablet 0  . carvedilol (COREG) 25 MG tablet Take 1 tablet (25 mg total) by mouth 2 (two) times daily with a meal. 180 tablet 3  . famotidine (PEPCID) 20 MG tablet Take 1 tablet (20 mg total) by mouth 2 (two) times daily. 60 tablet 1  . fluticasone (FLONASE) 50 MCG/ACT nasal spray Place 2 sprays into both nostrils daily. 16 g 6  . hydrALAZINE (APRESOLINE) 25 MG tablet Take 1 tablet (25 mg total) by mouth 3 (three) times daily. 90 tablet 1  . loratadine (CLARITIN) 10 MG tablet Take 10 mg by mouth daily.     Marland Kitchen losartan (COZAAR) 100 MG tablet Take 1 tablet (100 mg total) by mouth daily. 90 tablet 1  . Multiple Vitamins-Minerals (PRESERVISION AREDS  2) CAPS Take 2 capsules by mouth daily.    . rosuvastatin (CRESTOR) 20 MG tablet Take 1 tablet (20 mg total) by mouth daily. 90 tablet 3   No facility-administered medications prior to visit.    Allergies  Allergen Reactions  . Lisinopril Cough  . Ace Inhibitors Other (See Comments)    Has been told to avoid these and any diuretics because "left kidney has shrunk up and doesn't work"    ROS Review of Systems  Constitutional: Negative for appetite  change and unexpected weight change.  HENT: Negative for congestion and sinus pain.   Respiratory: Negative for cough and shortness of breath.   Cardiovascular: Negative for chest pain and palpitations.  Gastrointestinal: Negative for abdominal pain, constipation, diarrhea, nausea and vomiting.  Genitourinary: Negative for difficulty urinating.  Neurological: Negative.       Objective:    Physical Exam  Constitutional: She is oriented to person, place, and time. She appears well-developed and well-nourished.  HENT:  Head: Normocephalic and atraumatic.  Cardiovascular: Normal rate and regular rhythm.  Pulmonary/Chest: Effort normal and breath sounds normal.  Abdominal: Soft. She exhibits no distension. There is no abdominal tenderness.  No RUQ or epigastric tenderness. Abdominal exam is negative today.   Musculoskeletal:     Cervical back: Normal range of motion and neck supple.  Neurological: She is alert and oriented to person, place, and time.  Skin: Skin is warm and dry.  Vitals reviewed.   BP 138/70 (BP Location: Left Arm, Patient Position: Sitting, Cuff Size: Small)   Pulse 76   Temp 97.7 F (36.5 C) (Skin)   Ht 5\' 9"  (1.753 m)   Wt 227 lb 6.4 oz (103.1 kg)   SpO2 97%   BMI 33.58 kg/m  Wt Readings from Last 3 Encounters:  06/03/19 227 lb 6.4 oz (103.1 kg)  05/25/19 224 lb 12.8 oz (102 kg)  05/17/19 229 lb 9.6 oz (104.1 kg)     Health Maintenance Due  Topic Date Due  . COVID-19 Vaccine (1) Never done     There are no preventive care reminders to display for this patient.  Lab Results  Component Value Date   TSH 2.90 11/12/2018   Lab Results  Component Value Date   WBC 6.8 05/25/2019   HGB 11.9 (L) 05/25/2019   HCT 35.0 (L) 05/25/2019   MCV 92.3 05/25/2019   PLT 202.0 05/25/2019   Lab Results  Component Value Date   NA 140 05/17/2019   K 3.6 05/17/2019   CO2 25 05/17/2019   GLUCOSE 155 (H) 05/17/2019   BUN 18 05/17/2019   CREATININE 0.93 05/17/2019   BILITOT 0.6 05/17/2019   ALKPHOS 79 05/17/2019   AST 19 05/17/2019   ALT 21 05/17/2019   PROT 6.7 05/17/2019   ALBUMIN 4.2 05/17/2019   CALCIUM 9.2 05/17/2019   ANIONGAP 10 05/26/2017   GFR 59.71 (L) 05/17/2019   Lab Results  Component Value Date   CHOL 102 11/12/2018   Lab Results  Component Value Date   HDL 34 (L) 11/12/2018   Lab Results  Component Value Date   LDLCALC 50 11/12/2018   Lab Results  Component Value Date   TRIG 96 11/12/2018   Lab Results  Component Value Date   CHOLHDL 3.0 11/12/2018   Lab Results  Component Value Date   HGBA1C 5.6 05/25/2019   CLINICAL DATA:  Epigastric pain, right upper quadrant pain postprandial  EXAM: ABDOMEN ULTRASOUND COMPLETE  COMPARISON:  12/15/2017. Renal ultrasound 10/20/2017. CT 12/28/2012.  FINDINGS: Gallbladder: Gallstones noted within the gallbladder, the largest 9 mm. 6 mm anterior gallbladder wall polyp. No wall thickening or sonographic Murphy sign.  Common bile duct: Diameter: Normal caliber, 6 mm.  Liver: No focal lesion identified. Within normal limits in parenchymal echogenicity. Portal vein is patent on color Doppler imaging with normal direction of blood flow towards the liver.  IVC: No abnormality visualized.  Pancreas: Visualized portion unremarkable.  Spleen: Size and appearance within normal limits.  Right Kidney: Length: 11.4  cm. Echogenicity within normal limits. No mass or hydronephrosis visualized.  Left  Kidney: Length: 12.7 cm. Severe left hydronephrosis with severe left renal cortical thinning suggesting a chronic longstanding process. This is stable dating back to 2014 CT.  Abdominal aorta: No aneurysm visualized.  Other findings: None.  IMPRESSION: Cholelithiasis. Small gallbladder wall polyp. No sonographic evidence of acute cholecystitis.  Chronic severe left hydronephrosis with cortical atrophy. This is stable dating back to CT from 2014.   Electronically Signed   By: Charlett Nose M.D.   On: 06/03/2019 11:32   Assessment & Plan:   Problem List Items Addressed This Visit      Digestive   Calculus of gallbladder without cholecystitis without obstruction - Primary   Relevant Orders   Ambulatory referral to General Surgery   Gallbladder polyp   Relevant Orders   Ambulatory referral to General Surgery     Other   Epigastric pain     She has been experiencing RUQ pressure,  epigastric pain post prandial especially after eating BBQ and bacon and this occurs 1-2  a week for over a year.  No radiation into back or lower abdomen. No association with N/V. Triggers are fatty food and stress at work. She did agree to take a daily Pepcid for heartburn that started after her CABG in 2019.  She was taken off of 81 mg ASA and placed on 325 mg ASA and has wondered if that is upsetting her stomach. She admits that her diet is poor and she often eats out at restaurants. She cannot lower her ASA dose from the 325 mg per Dr. Santo Held recommendation since she refused Plavix.   I did recommend a GI referral that has not been completed, yet. Her symptoms could be from gastritis, erosive gastritis, H pylori, PUD, and  biliary colic as it is brought on by fatty meals. It is also brought on by stress to consider functional cause. She declines any medication other than Pepcid at this time. Advised to take her Pepcid twice daily, but she will agree to once daily dosing .   There was no  cholecystis seen on the [place] and her liver panel is normal. I will send in a referral to general surgeon to look at her [place] and recommend follow-up for the  gallbladder polyp and gallstones with periodic right upper abdominal pain. Dr. Lemar Livings can also evaluate her for EGD/colonoscopy.   She is over due for her cardiology follow-up. She was advised to make an appt. There is no unintentional weight loss. She has a single kidney with GFR 59.71. She is borderline pre- diabetic, A1C 5.6  with CHF and ASCVD history. Hgb 11.9 g/dL , Hct 50%, MCV 09.3. Direct LDL 64.   She was advised about heart healthy diet and if she cuts back on the high fat foods, she may have less stomach problems. Patient was told that gallstones can move and get lodged and if she develops worsening pain or vomiting to seek medical care.  She was provided AVS handouts on obesity, calorie counting and healthy food choices and I reviewed the gallstone recommendation treatment and alarm signs  with her. She voices understanding.   No orders of the defined types were placed in this encounter.   Follow-up: Return in about 3 months (around 09/02/2019) with Dr. Birdie Sons.    This visit occurred during the SARS-CoV-2 public health emergency.  Safety protocols were in place, including screening questions prior to the visit, additional usage of  staff PPE, and extensive cleaning of exam room while observing appropriate contact time as indicated for disinfecting solutions.   Denice Paradise, NP

## 2019-06-05 ENCOUNTER — Encounter: Payer: Self-pay | Admitting: Nurse Practitioner

## 2019-06-05 NOTE — Assessment & Plan Note (Signed)
There was no cholecystis seen on the US and her liver panel is normal. I will send in a referral to general surgeon to look at her US and recommend follow-up for the  gallbladder polyp and gallstones with periodic right upper abdominal pain.    

## 2019-06-05 NOTE — Assessment & Plan Note (Addendum)
She will agree to Pepcid once daily. Per her cardiologist, Dr. Park Breed, she must remain on 325 mg ASA because she declined Plavix. Referral to GI.

## 2019-06-05 NOTE — Assessment & Plan Note (Signed)
There was no cholecystis seen on the [place] and her liver panel is normal. I will send in a referral to general surgeon to look at her [place] and recommend follow-up for the  gallbladder polyp and gallstones with periodic right upper abdominal pain.

## 2019-06-09 ENCOUNTER — Telehealth: Payer: Self-pay | Admitting: Family Medicine

## 2019-06-09 DIAGNOSIS — N1832 Chronic kidney disease, stage 3b: Secondary | ICD-10-CM | POA: Diagnosis not present

## 2019-06-09 DIAGNOSIS — N1330 Unspecified hydronephrosis: Secondary | ICD-10-CM | POA: Diagnosis not present

## 2019-06-09 DIAGNOSIS — I10 Essential (primary) hypertension: Secondary | ICD-10-CM | POA: Diagnosis not present

## 2019-06-09 NOTE — Telephone Encounter (Signed)
Rejection Reason - Patient did not respond" [facility] said about 1 hour ago   

## 2019-06-21 DIAGNOSIS — K824 Cholesterolosis of gallbladder: Secondary | ICD-10-CM | POA: Diagnosis not present

## 2019-06-21 DIAGNOSIS — K8020 Calculus of gallbladder without cholecystitis without obstruction: Secondary | ICD-10-CM | POA: Diagnosis not present

## 2019-08-16 ENCOUNTER — Encounter: Payer: Self-pay | Admitting: Family Medicine

## 2019-08-16 ENCOUNTER — Other Ambulatory Visit: Payer: Self-pay

## 2019-08-16 ENCOUNTER — Ambulatory Visit (INDEPENDENT_AMBULATORY_CARE_PROVIDER_SITE_OTHER): Payer: BC Managed Care – PPO | Admitting: Family Medicine

## 2019-08-16 VITALS — BP 130/70 | HR 73 | Temp 98.0°F | Ht 69.0 in | Wt 229.0 lb

## 2019-08-16 DIAGNOSIS — I2510 Atherosclerotic heart disease of native coronary artery without angina pectoris: Secondary | ICD-10-CM | POA: Diagnosis not present

## 2019-08-16 DIAGNOSIS — E785 Hyperlipidemia, unspecified: Secondary | ICD-10-CM

## 2019-08-16 DIAGNOSIS — N2889 Other specified disorders of kidney and ureter: Secondary | ICD-10-CM | POA: Diagnosis not present

## 2019-08-16 DIAGNOSIS — I151 Hypertension secondary to other renal disorders: Secondary | ICD-10-CM | POA: Diagnosis not present

## 2019-08-16 DIAGNOSIS — R0789 Other chest pain: Secondary | ICD-10-CM | POA: Diagnosis not present

## 2019-08-16 LAB — TROPONIN I (HIGH SENSITIVITY): High Sens Troponin I: 3 ng/L (ref 2–17)

## 2019-08-16 NOTE — Patient Instructions (Signed)
Nice to see you. Please continue with your current medications. We will check lab today and contact you with the results. If you develop recurrent chest pain or progression of those symptoms please seek medical attention.

## 2019-08-16 NOTE — Assessment & Plan Note (Signed)
Currently asymptomatic though has had some chest tightness over the last couple of days.  Discussed ED evaluation though she declines.  EKG performed today.  We will get a troponin which I think will be adequate to help rule out MI given that she has had symptoms for several days.  Discussed if she had any recurrence of symptoms or progression of symptoms she needs to seek medical attention in the emergency department.

## 2019-08-16 NOTE — Assessment & Plan Note (Signed)
Decent control for her.  She will continue with her current regimen.

## 2019-08-16 NOTE — Progress Notes (Signed)
  [name], MD Phone: [phone]  Audrey Wolf is a 69 y.o. female who presents today for f/u.  HYPERTENSION  Disease Monitoring  Home BP Monitoring similar to today typically, did have several days last week with systolic BP near 702  Chest pain- see below    Dyspnea- no Medications  Compliance-  Taking amlodipine, coreg, hydralazine, losartan.   Edema- no  HYPERLIPIDEMIA Symptoms Chest pain on exertion:  See below    Medications: Compliance- taking crestor Right upper quadrant pain- no  Muscle aches- no  Chest tightness: Patient notes over the last few days she has had intermittent chest tightness.  This can occur at rest or with activity.  Does not get worse with activity.  No diaphoresis.  No radiation.  No shortness of breath.  She does not have any symptoms now.  She did previously have similar symptoms when she had a heart attack.    Social History   Tobacco Use  Smoking Status Never Smoker  Smokeless Tobacco Never Used     ROS see history of present illness  Objective  Physical Exam Vitals:   08/16/19 1031  BP: 130/70  Pulse: 73  Temp: 98 F (36.7 C)  SpO2: 97%    BP Readings from Last 3 Encounters:  08/16/19 130/70  06/03/19 138/70  05/25/19 (!) 144/60   Wt Readings from Last 3 Encounters:  08/16/19 229 lb (103.9 kg)  06/03/19 227 lb 6.4 oz (103.1 kg)  05/25/19 224 lb 12.8 oz (102 kg)    Physical Exam Constitutional:      General: She is not in acute distress.    Appearance: She is not diaphoretic.  Cardiovascular:     Rate and Rhythm: Normal rate and regular rhythm.     Heart sounds: Normal heart sounds.  Pulmonary:     Effort: Pulmonary effort is normal.     Breath sounds: Normal breath sounds.  Musculoskeletal:     Right lower leg: No edema.     Left lower leg: No edema.  Skin:    General: Skin is warm and dry.  Neurological:     Mental Status: She is alert.    EKG: Sinus rhythm, rate 70, no apparent ST or T wave  changes compared to prior EKG  Assessment/Plan: Please see individual problem list.  Hypertension Decent control for her.  She will continue with her current regimen.  Hyperlipidemia Continue Crestor.  Chest tightness Currently asymptomatic though has had some chest tightness over the last couple of days.  Discussed ED evaluation though she declines.  EKG performed today.  We will get a troponin which I think will be adequate to help rule out MI given that she has had symptoms for several days.  Discussed if she had any recurrence of symptoms or progression of symptoms she needs to seek medical attention in the emergency department.    Orders Placed This Encounter  Procedures  . EKG 12-Lead    No orders of the defined types were placed in this encounter.   This visit occurred during the SARS-CoV-2 public health emergency.  Safety protocols were in place, including screening questions prior to the visit, additional usage of staff PPE, and extensive cleaning of exam room while observing appropriate contact time as indicated for disinfecting solutions.    [name], MD [facility]

## 2019-08-16 NOTE — Assessment & Plan Note (Signed)
Continue Crestor 

## 2019-08-18 ENCOUNTER — Other Ambulatory Visit: Payer: Self-pay

## 2019-08-18 DIAGNOSIS — N261 Atrophy of kidney (terminal): Secondary | ICD-10-CM

## 2019-08-19 ENCOUNTER — Other Ambulatory Visit: Payer: Self-pay

## 2019-08-19 ENCOUNTER — Other Ambulatory Visit: Payer: Medicare Other

## 2019-08-19 DIAGNOSIS — N261 Atrophy of kidney (terminal): Secondary | ICD-10-CM

## 2019-08-20 LAB — BASIC METABOLIC PANEL
BUN/Creatinine Ratio: 20 (ref 12–28)
BUN: 20 mg/dL (ref 8–27)
CO2: 22 mmol/L (ref 20–29)
Calcium: 9.6 mg/dL (ref 8.7–10.3)
Chloride: 107 mmol/L — ABNORMAL HIGH (ref 96–106)
Creatinine, Ser: 0.99 mg/dL (ref 0.57–1.00)
GFR calc Af Amer: 67 mL/min/{1.73_m2} (ref >59)
GFR calc non Af Amer: 58 mL/min/{1.73_m2} — ABNORMAL LOW (ref >59)
Glucose: 94 mg/dL (ref 65–99)
Potassium: 4.1 mmol/L (ref 3.5–5.2)
Sodium: 144 mmol/L (ref 134–144)

## 2019-08-25 ENCOUNTER — Encounter: Payer: Self-pay | Admitting: Urology

## 2019-08-25 ENCOUNTER — Ambulatory Visit (INDEPENDENT_AMBULATORY_CARE_PROVIDER_SITE_OTHER): Payer: BC Managed Care – PPO | Admitting: Urology

## 2019-08-25 VITALS — BP 131/66 | HR 64 | Ht 69.0 in | Wt 229.0 lb

## 2019-08-25 DIAGNOSIS — N200 Calculus of kidney: Secondary | ICD-10-CM

## 2019-08-25 DIAGNOSIS — N1330 Unspecified hydronephrosis: Secondary | ICD-10-CM

## 2019-08-25 DIAGNOSIS — N3281 Overactive bladder: Secondary | ICD-10-CM

## 2019-08-25 DIAGNOSIS — E7800 Pure hypercholesterolemia, unspecified: Secondary | ICD-10-CM | POA: Insufficient documentation

## 2019-08-25 DIAGNOSIS — J449 Chronic obstructive pulmonary disease, unspecified: Secondary | ICD-10-CM | POA: Insufficient documentation

## 2019-08-25 MED ORDER — OXYBUTYNIN CHLORIDE ER 10 MG PO TB24: 10.0000 mg | ORAL_TABLET | Freq: Every day | ORAL | 11 refills | Status: DC

## 2019-08-25 NOTE — Progress Notes (Signed)
   08/25/2019 12:58 PM   Audrey Wolf 1950-04-20 [id number]  Reason for visit: Follow up chronic left hydronephrosis, new bladder symptoms  HPI: I saw Audrey Wolf back in urology clinic today for the above issues.  She is a 69 year old female with chronic left hydronephrosis since at least 2014.  She was previously managed by Audrey Wolf who attempted to treat her left distal ureteral stone in 2015, but was unable to access the stone or place a stent and they opted for observation for her chronically obstructed left kidney. They opted for observation and her left distal ureteral stone remains in situ with chronic left hydronephrosis and left renal atrophy.   She has been doing well over the last year. She occasionally has some low left back pain in the morning, but this resolves once she is up and moving around. Denies any hematuria or UTIs. BP has been better controlled. Renal function stable with sCr of 0.99, eGFR 60. She reports chronic bothersome urinary symptoms of urgency, frequency, urge incontinence, and nocturia. Has never tried medications for this before.  Physical Exam: BP (!) 131/66 (BP Location: Left Arm, Patient Position: Sitting, Cuff Size: Large)   Pulse 64   Ht '5\' 9"'$  (1.753 m)   Wt (!) 229 lb (103.9 kg)   BMI 33.82 kg/m    Chronic left hydro and left ureteral stone since 2014 with atrophic kidney. Renal function normal and stable. Chronic OAB symptoms and interested in trial of meds.  We discussed that overactive bladder (OAB) is not a disease, but is a symptom complex that is generally not life-threatening.  Symptoms typically include urinary urgency, frequency, and urge incontinence.  There are numerous treatment options, however there are risks and benefits with both medical and surgical management.  First-line treatment is behavioral therapies including bladder training, pelvic floor muscle training, and fluid management.  Second line treatments include oral  antimuscarinics(Ditropan er, Trospium) and beta-3 agonist (Mybetriq). There is typically a period of medication trial (4-8 weeks) to find the optimal therapy and dosing. If symptoms are bothersome despite the above management, third line options include intra-detrusor botox, peripheral tibial nerve stimulation (PTNS), and interstim (SNS). These are more invasive treatments with higher side effect profile, but may improve quality of life for patients with severe OAB symptoms.   Trial of oxybutynin 10 mg XL daily for OAB symptoms RTC 6 weeks virtual visit for symptom check   Audrey Wolf, [facility] [address] [place], [place] [address] [phone]

## 2019-08-25 NOTE — Patient Instructions (Signed)

## 2019-10-06 ENCOUNTER — Other Ambulatory Visit: Payer: Self-pay

## 2019-10-06 ENCOUNTER — Telehealth (INDEPENDENT_AMBULATORY_CARE_PROVIDER_SITE_OTHER): Payer: BC Managed Care – PPO | Admitting: Urology

## 2019-10-06 DIAGNOSIS — N1330 Unspecified hydronephrosis: Secondary | ICD-10-CM | POA: Diagnosis not present

## 2019-10-06 DIAGNOSIS — N3281 Overactive bladder: Secondary | ICD-10-CM | POA: Diagnosis not present

## 2019-10-06 DIAGNOSIS — N1831 Chronic kidney disease, stage 3a: Secondary | ICD-10-CM | POA: Diagnosis not present

## 2019-10-06 DIAGNOSIS — N2581 Secondary hyperparathyroidism of renal origin: Secondary | ICD-10-CM | POA: Diagnosis not present

## 2019-10-06 DIAGNOSIS — I10 Essential (primary) hypertension: Secondary | ICD-10-CM | POA: Diagnosis not present

## 2019-10-06 NOTE — Progress Notes (Signed)
Virtual Visit via Telephone Note  I connected with Audrey Wolf on 10/06/19 at  8:15 AM EDT by telephone and verified that I am speaking with the correct person using two identifiers.   Patient location: Home Provider location: [facility]   I discussed the limitations, risks, security and privacy concerns of performing an evaluation and management service by telephone and the availability of in person appointments. We discussed the impact of the COVID-19 pandemic on the healthcare system, and the importance of social distancing and reducing patient and provider exposure. I also discussed with the patient that there may be a patient responsible charge related to this service. The patient expressed understanding and agreed to proceed.  Reason for visit: Discuss overactive bladder  History of Present Illness: Briefly, she is a complex 69 year old female with history of chronic left hydronephrosis since at least 2014 he was previously followed by Dr. Achilles Dunk who opted observation for left distal ureteral stone and chronically obstructed atrophic left kidney.  Renal function has been stable, and her blood pressure is improved.  At her last visit, she mentioned long-term OAB symptoms of urgency, frequency, urge incontinence, nocturia.  We opted for a trial of oxybutynin 10 mg XL.  She reports significant improvement in her urinary symptoms on this medication, and she is no longer having the incontinence, and urgency has improved  She does not want to increase the dose and is happy with how things are going right now.  She would like to continue this medication.  Risks and benefits/side effects discussed.   Follow Up: Continue oxybutynin 10 mg XL for OAB symptoms 1 year follow-up with BMP prior   I discussed the assessment and treatment plan with the patient. The patient was provided an opportunity to ask questions and all were answered. The patient agreed with the plan and  demonstrated an understanding of the instructions.   The patient was advised to call back or seek an in-person evaluation if the symptoms worsen or if the condition fails to improve as anticipated.  I provided 13 minutes of non-face-to-face time during this encounter.   Sondra Come, MD

## 2019-11-18 ENCOUNTER — Ambulatory Visit: Payer: BC Managed Care – PPO | Admitting: Family Medicine

## 2019-11-29 ENCOUNTER — Other Ambulatory Visit: Payer: Self-pay

## 2019-11-29 ENCOUNTER — Ambulatory Visit (INDEPENDENT_AMBULATORY_CARE_PROVIDER_SITE_OTHER): Payer: BC Managed Care – PPO | Admitting: Family Medicine

## 2019-11-29 ENCOUNTER — Encounter: Payer: Self-pay | Admitting: Family Medicine

## 2019-11-29 VITALS — BP 121/70 | HR 78 | Temp 98.3°F | Ht 69.0 in | Wt 228.4 lb

## 2019-11-29 DIAGNOSIS — I2510 Atherosclerotic heart disease of native coronary artery without angina pectoris: Secondary | ICD-10-CM

## 2019-11-29 DIAGNOSIS — J4520 Mild intermittent asthma, uncomplicated: Secondary | ICD-10-CM | POA: Diagnosis not present

## 2019-11-29 DIAGNOSIS — K8020 Calculus of gallbladder without cholecystitis without obstruction: Secondary | ICD-10-CM | POA: Diagnosis not present

## 2019-11-29 DIAGNOSIS — N2889 Other specified disorders of kidney and ureter: Secondary | ICD-10-CM | POA: Diagnosis not present

## 2019-11-29 DIAGNOSIS — Z1231 Encounter for screening mammogram for malignant neoplasm of breast: Secondary | ICD-10-CM | POA: Diagnosis not present

## 2019-11-29 DIAGNOSIS — E785 Hyperlipidemia, unspecified: Secondary | ICD-10-CM | POA: Diagnosis not present

## 2019-11-29 DIAGNOSIS — M1990 Unspecified osteoarthritis, unspecified site: Secondary | ICD-10-CM | POA: Insufficient documentation

## 2019-11-29 DIAGNOSIS — I151 Hypertension secondary to other renal disorders: Secondary | ICD-10-CM

## 2019-11-29 LAB — COMPREHENSIVE METABOLIC PANEL
ALT: 18 U/L (ref 0–35)
AST: 18 U/L (ref 0–37)
Albumin: 3.8 g/dL (ref 3.5–5.2)
Alkaline Phosphatase: 75 U/L (ref 39–117)
BUN: 20 mg/dL (ref 6–23)
CO2: 27 mEq/L (ref 19–32)
Calcium: 9.1 mg/dL (ref 8.4–10.5)
Chloride: 106 mEq/L (ref 96–112)
Creatinine, Ser: 0.99 mg/dL (ref 0.40–1.20)
GFR: 58.01 mL/min — ABNORMAL LOW (ref >60.00)
Glucose, Bld: 94 mg/dL (ref 70–99)
Potassium: 4 mEq/L (ref 3.5–5.1)
Sodium: 140 mEq/L (ref 135–145)
Total Bilirubin: 0.5 mg/dL (ref 0.2–1.2)
Total Protein: 5.4 g/dL — ABNORMAL LOW (ref 6.0–8.3)

## 2019-11-29 LAB — LIPID PANEL
Cholesterol: 105 mg/dL (ref 0–200)
HDL: 32 mg/dL — ABNORMAL LOW (ref >39.00)
LDL Cholesterol: 55 mg/dL (ref 0–99)
NonHDL: 73.46
Total CHOL/HDL Ratio: 3
Triglycerides: 94 mg/dL (ref 0.0–149.0)
VLDL: 18.8 mg/dL (ref 0.0–40.0)

## 2019-11-29 NOTE — Progress Notes (Signed)
Tommi Rumps, MD Phone: [phone]  Audrey Wolf is a 69 y.o. female who presents today for f/u.  HYPERTENSION  Disease Monitoring  Home BP Monitoring not checking     Chest pain- no    Dyspnea- no Medications  Compliance-  Taking amlodipine, coreg, hydralazine, losartan.                    Edema- no  HYPERLIPIDEMIA Symptoms Chest pain on exertion:  no    Medications: Compliance- taking crestor     Right upper quadrant pain- no            Muscle aches- no  Gallstones: Patient notes occasional discomfort related to this.  She has seen general surgery and they recommended surgery though she is trying to wait until after the holidays for this.  Asthma: Patient notes occasionally she will have issues with this when the weather changes.  No wheezing, cough, or shortness of breath currently.  Rarely uses her albuterol though it has good benefit.  Arthritis: Notes in her bilateral hands.  They hurt and are stiff at times.  Taking Tylenol with some benefit  Social History   Tobacco Use  Smoking Status Never Smoker  Smokeless Tobacco Never Used     ROS see history of present illness  Objective  Physical Exam Vitals:   11/29/19 1203  BP: 121/70  Pulse: 78  Temp: 98.3 F (36.8 C)  SpO2: 96%    BP Readings from Last 3 Encounters:  11/29/19 121/70  08/25/19 (!) 131/66  08/16/19 130/70   Wt Readings from Last 3 Encounters:  11/29/19 228 lb 6.4 oz (103.6 kg)  08/25/19 (!) 229 lb (103.9 kg)  08/16/19 229 lb (103.9 kg)    Physical Exam Constitutional:      General: She is not in acute distress.    Appearance: She is not diaphoretic.  Cardiovascular:     Rate and Rhythm: Normal rate and regular rhythm.     Heart sounds: Normal heart sounds.  Pulmonary:     Effort: Pulmonary effort is normal.     Breath sounds: Normal breath sounds.  Musculoskeletal:     Right lower leg: No edema.     Left lower leg: No edema.     Comments: Bilateral hands with  slight discomfort to palpation on the dorsum  Skin:    General: Skin is warm and dry.  Neurological:     Mental Status: She is alert.   Assessment/Plan: Please see individual problem list.  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  She can continue Tylenol over-the-counter.      Asthma    Stable.  She notes her insurance is no longer going to cover albuterol.  She will bring the insurance letter in for me to review.      Calculus of gallbladder without cholecystitis without obstruction    Discussed monitoring.  Advised if she develops persistent or worsening pain she needs to be evaluated by surgery again to consider removal of her gallbladder.  She will follow-up with them for cholecystectomy when she is ready.      Hyperlipidemia    Continue Crestor 20 mg once daily.  Check lipid panel.      Hypertension    Well-controlled.  Continue amlodipine 10 mg once daily, carvedilol 25 mg twice daily, hydralazine 25 mg 3 times daily, and losartan 100 mg daily.  Check CMP.  Relevant Orders   Lipid panel   Comp Met (CMET)    Other Visit Diagnoses    Encounter for screening mammogram for malignant neoplasm of breast    -  Primary   Relevant Orders   MM 3D SCREEN BREAST BILATERAL     Health Maintenance: Patient will call to schedule her mammogram for breast cancer screening.   This visit occurred during the SARS-CoV-2 public health emergency.  Safety protocols were in place, including screening questions prior to the visit, additional usage of staff PPE, and extensive cleaning of exam room while observing appropriate contact time as indicated for disinfecting solutions.    Tommi Rumps, MD [place]

## 2019-11-29 NOTE — Assessment & Plan Note (Signed)
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  She can continue Tylenol over-the-counter.

## 2019-11-29 NOTE — Assessment & Plan Note (Signed)
Stable.  She notes her insurance is no longer going to cover albuterol.  She will bring the insurance letter in for me to review.

## 2019-11-29 NOTE — Assessment & Plan Note (Signed)
Continue Crestor 20 mg once daily.  Check lipid panel. 

## 2019-11-29 NOTE — Patient Instructions (Signed)
Nice to see you. We will get labs today. Please bring me the letter regarding your albuterol. Please call to schedule your mammogram.

## 2019-11-29 NOTE — Assessment & Plan Note (Signed)
Well-controlled.  Continue amlodipine 10 mg once daily, carvedilol 25 mg twice daily, hydralazine 25 mg 3 times daily, and losartan 100 mg daily.  Check CMP.

## 2019-11-29 NOTE — Assessment & Plan Note (Signed)
Discussed monitoring.  Advised if she develops persistent or worsening pain she needs to be evaluated by surgery again to consider removal of her gallbladder.  She will follow-up with them for cholecystectomy when she is ready.

## 2020-01-05 ENCOUNTER — Encounter: Payer: Self-pay | Admitting: Family Medicine

## 2020-02-23 IMAGING — DX DG CHEST 1V PORT
1 series · 1 of 1 positions shown · non-contrast
Comparison: May 19, 2017

CLINICAL DATA: Status post coronary artery bypass grafting.
Hypoxia.

EXAM:
PORTABLE CHEST 1 VIEW

[chest ap]
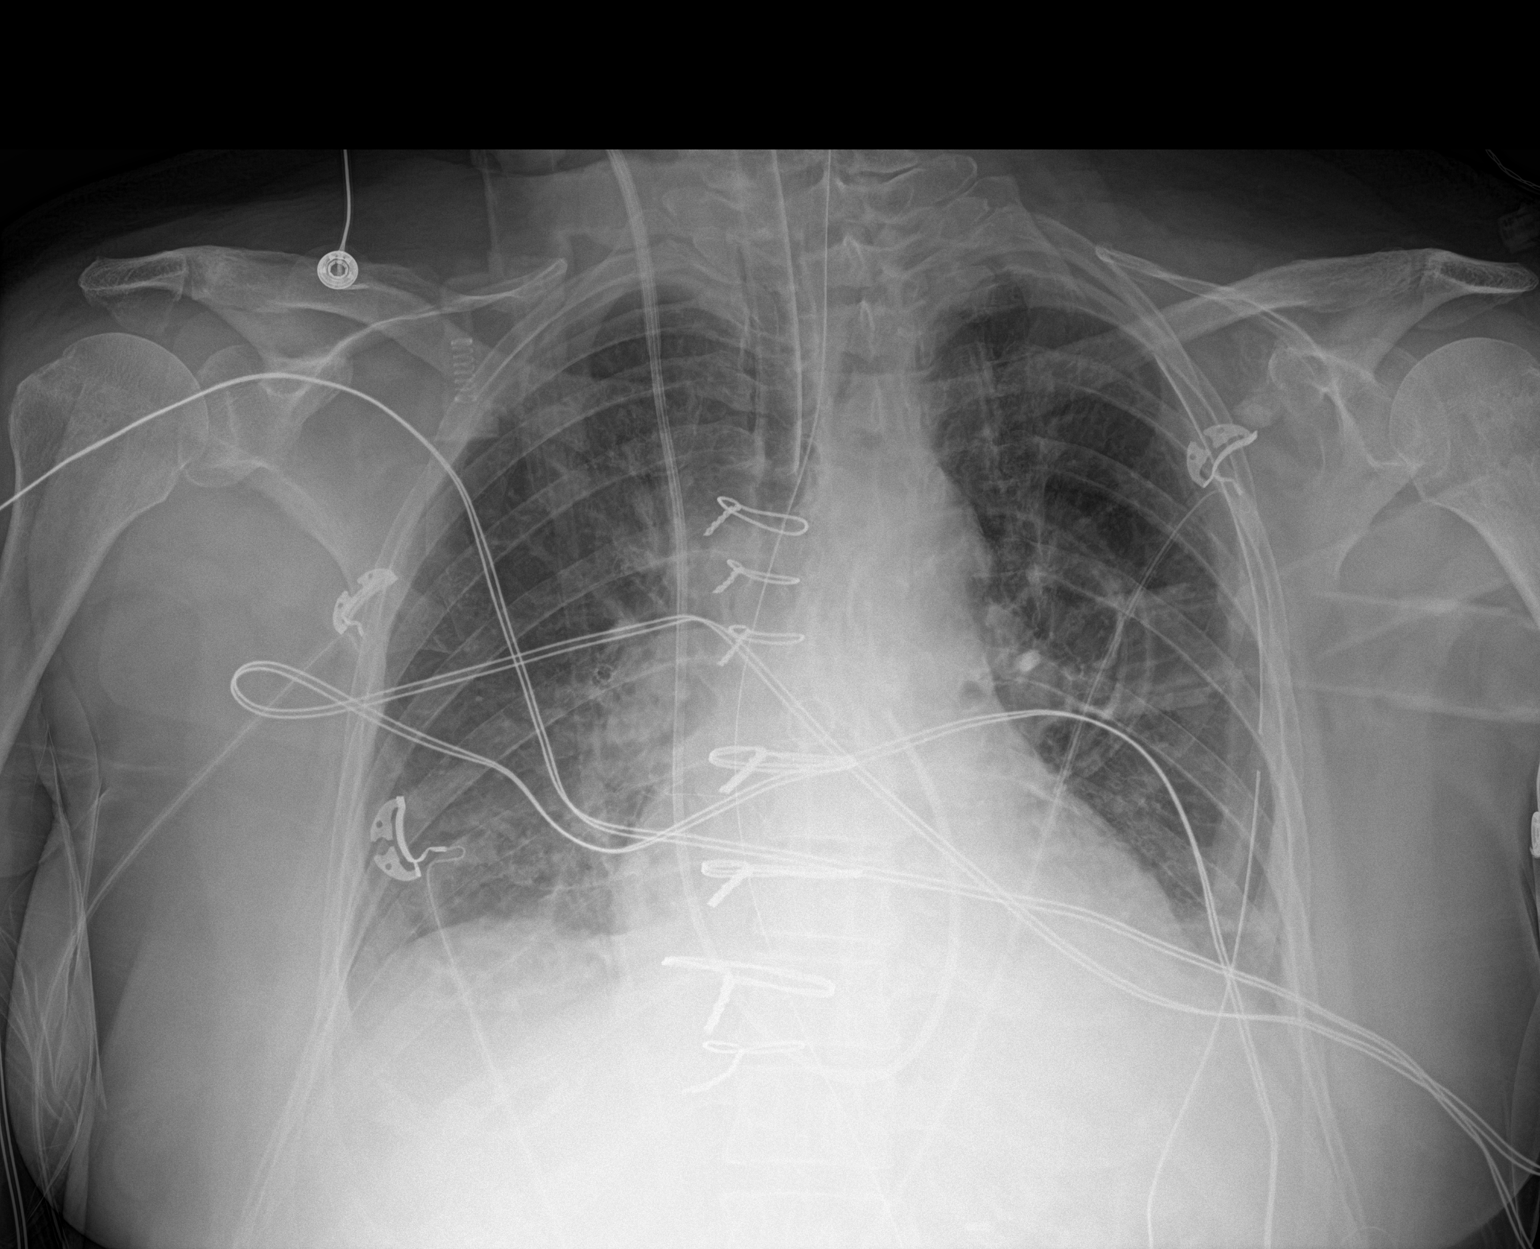

[1 of 1 positions shown; findings below may reference images not displayed]

FINDINGS: Endotracheal tube tip is 4.0 cm above the carina. Nasogastric tube
tip and side port are below the diaphragm. Swan-Ganz catheter tip is
in the main pulmonary outflow tract. There is a chest tube on the
left. No pneumothorax.

There is bibasilar atelectasis. Lungs elsewhere clear. Heart is
upper normal in size with pulmonary vascularity within normal
limits. No adenopathy. Patient is status post coronary artery bypass
grafting. No bone lesions.
IMPRESSION: Tube and catheter positions as described without pneumothorax. Mild
bibasilar atelectasis. Stable cardiac silhouette.

## 2020-02-24 IMAGING — DX DG CHEST 1V PORT
1 series · 1 of 1 positions shown · non-contrast
Comparison: 05/22/2017

CLINICAL DATA: Status post CABG.

EXAM:
PORTABLE CHEST 1 VIEW

[chest ap]
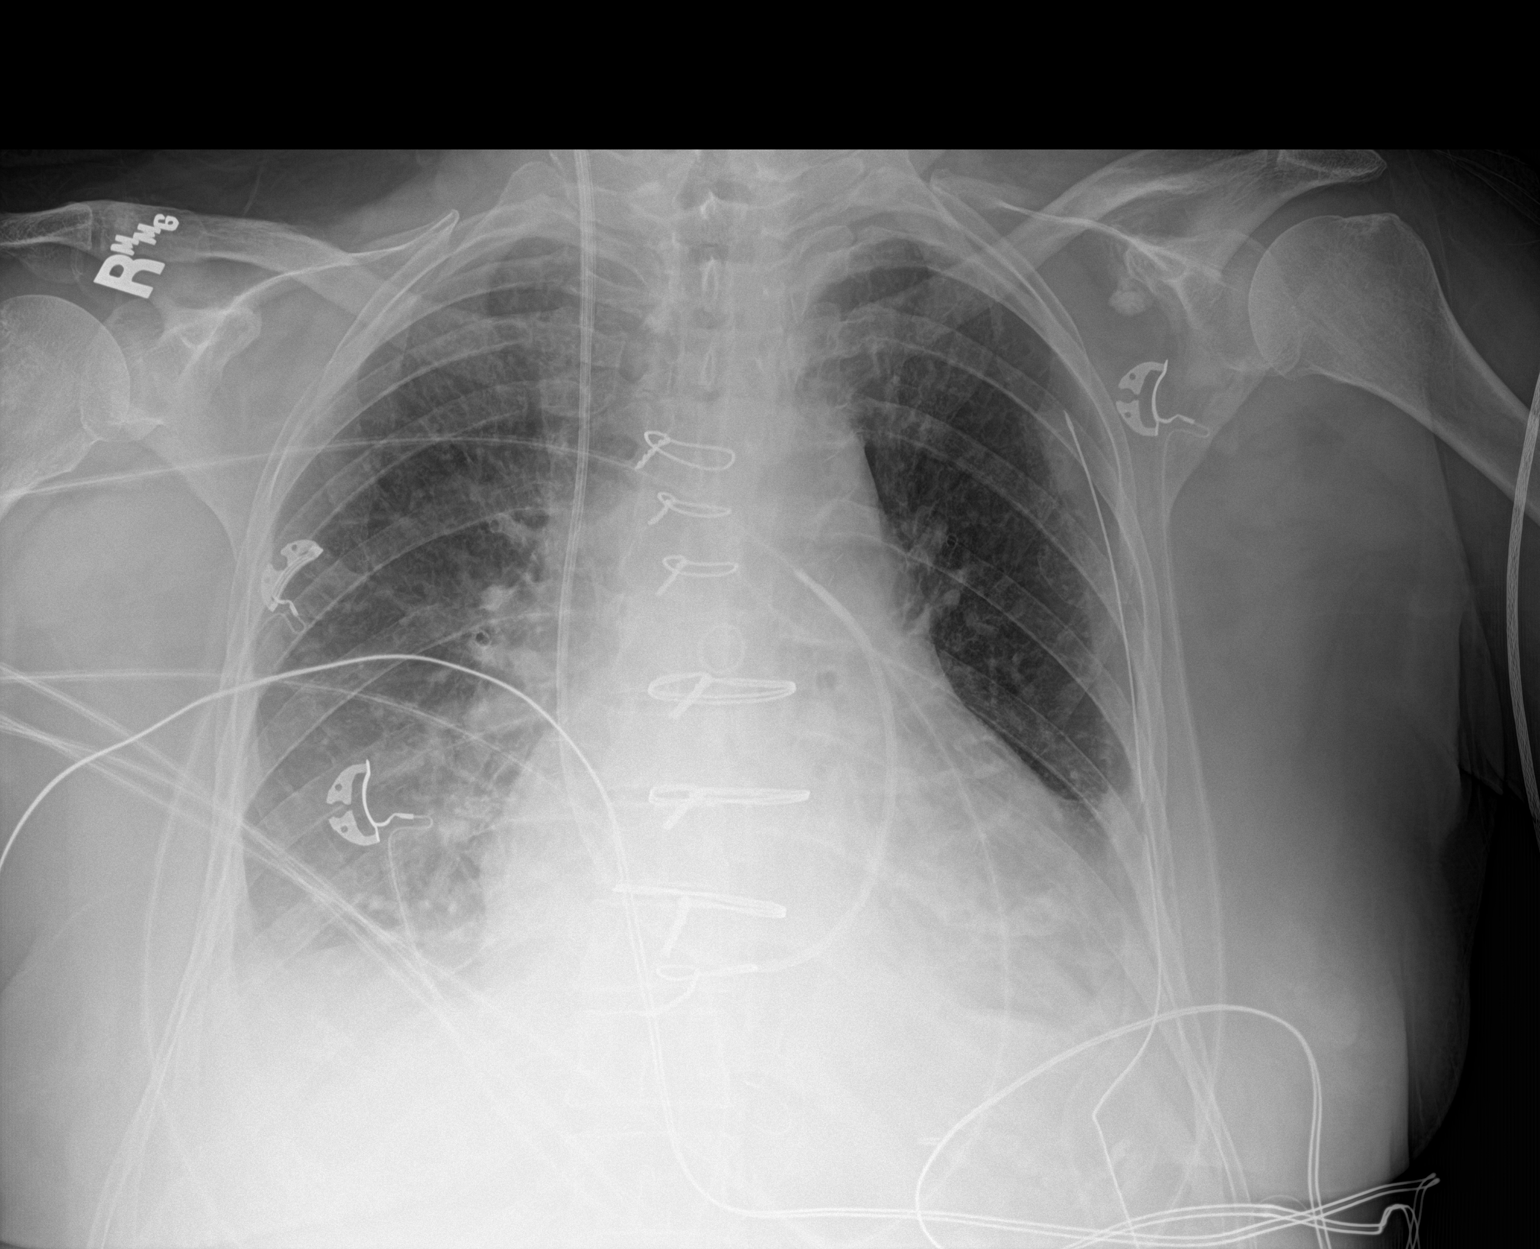

[1 of 1 positions shown; findings below may reference images not displayed]

FINDINGS: Sequelae of recent CABG are again identified. Endotracheal tube and
enteric tube have been removed. Right Swan-Ganz catheter terminates
over the main pulmonary outflow tract. Left chest tube remains in
place and appears to have been slightly retracted in the interim.
Bibasilar opacities are stable to slightly increased and likely
represent atelectasis. There are likely small bilateral pleural
effusions. No pneumothorax is identified.
IMPRESSION: 1. Interval extubation.
2. Persistent bibasilar atelectasis and likely small pleural
effusions.

## 2020-02-25 IMAGING — DX DG CHEST 1V PORT
1 series · 1 of 1 positions shown · non-contrast
Comparison: 05/23/2017

CLINICAL DATA: Atelectasis.

EXAM:
PORTABLE CHEST 1 VIEW

[chest ap]
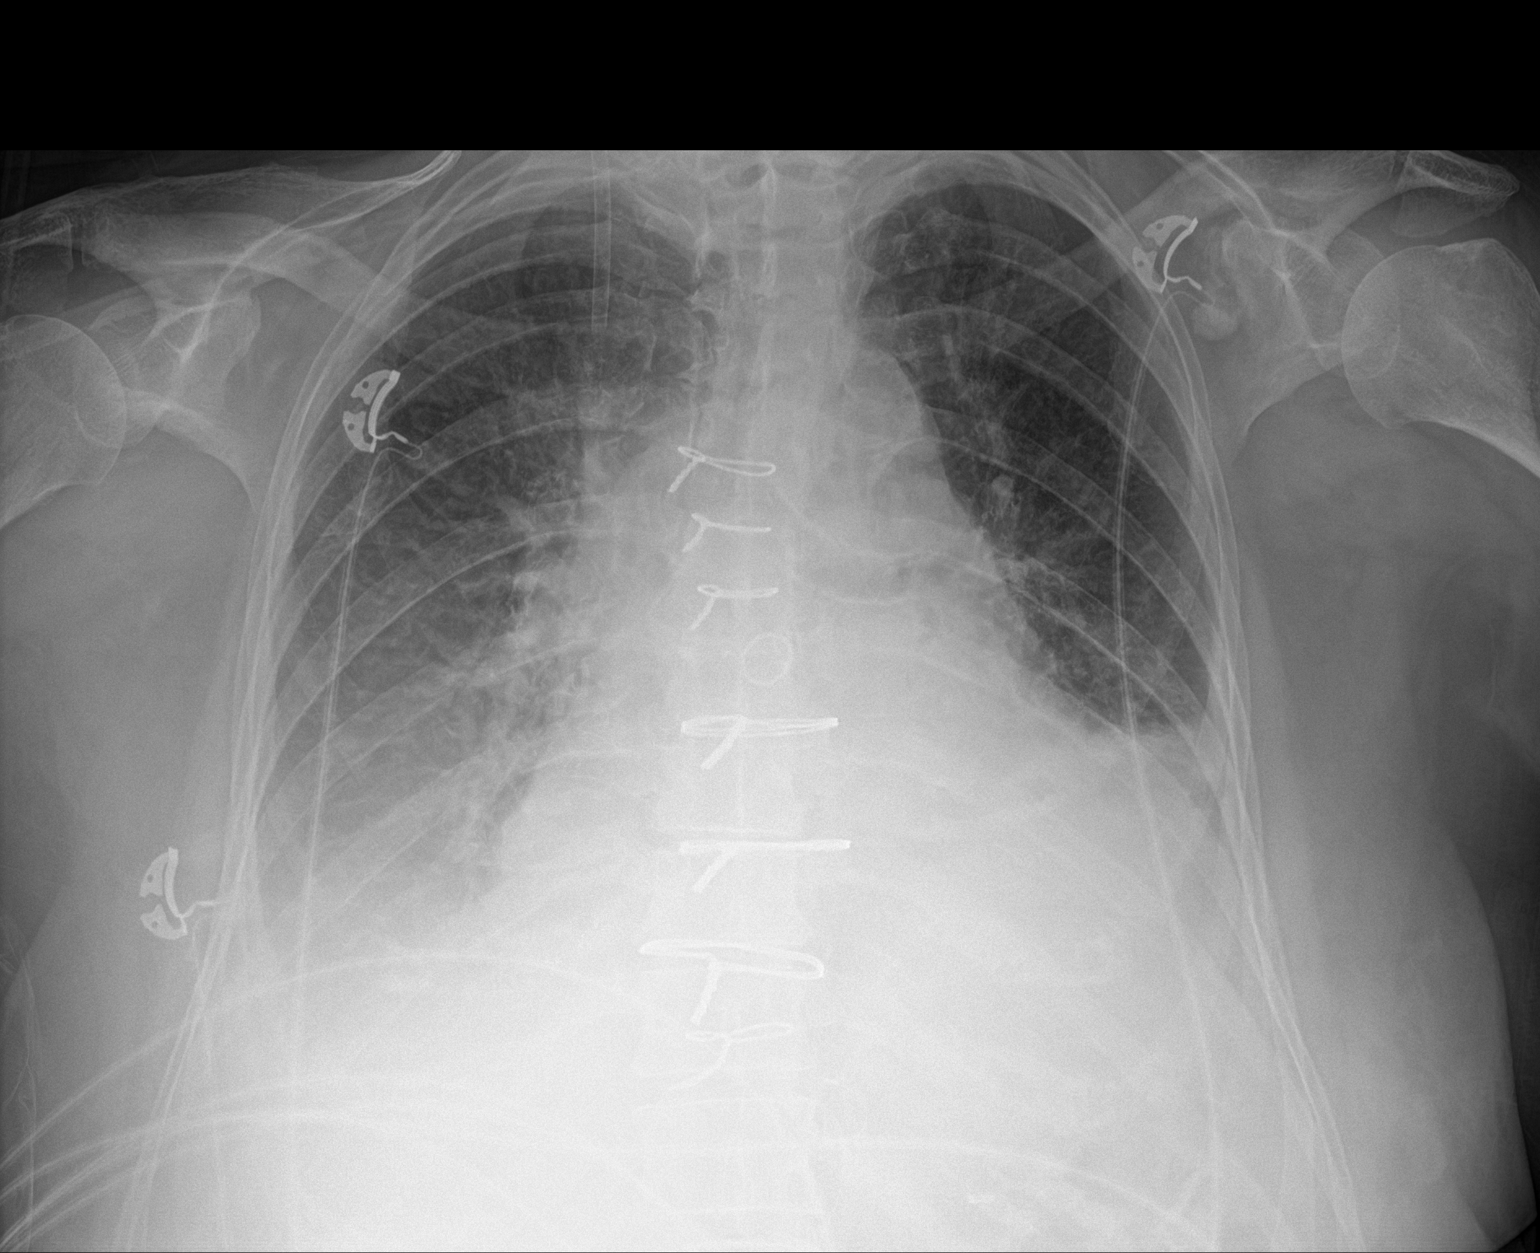

[1 of 1 positions shown; findings below may reference images not displayed]

FINDINGS: Sequelae of recent CABG are again identified. Swan-Ganz catheter and
left chest tube have been removed. A right jugular sheath remains.
The cardiac silhouette remains enlarged. There are persistent
pleural effusions and bibasilar opacities suggesting atelectasis
with overall mild worsening from the prior study. No pneumothorax is
identified.
IMPRESSION: Bilateral pleural effusions with worsening bibasilar lung aeration.

## 2020-02-27 IMAGING — DX DG CHEST 2V
2 series · 2 of 2 positions shown · non-contrast
Comparison: Radiographs May 25, 2017.

CLINICAL DATA: Status post coronary artery bypass graft.

EXAM:
CHEST - 2 VIEW

[chest pa]
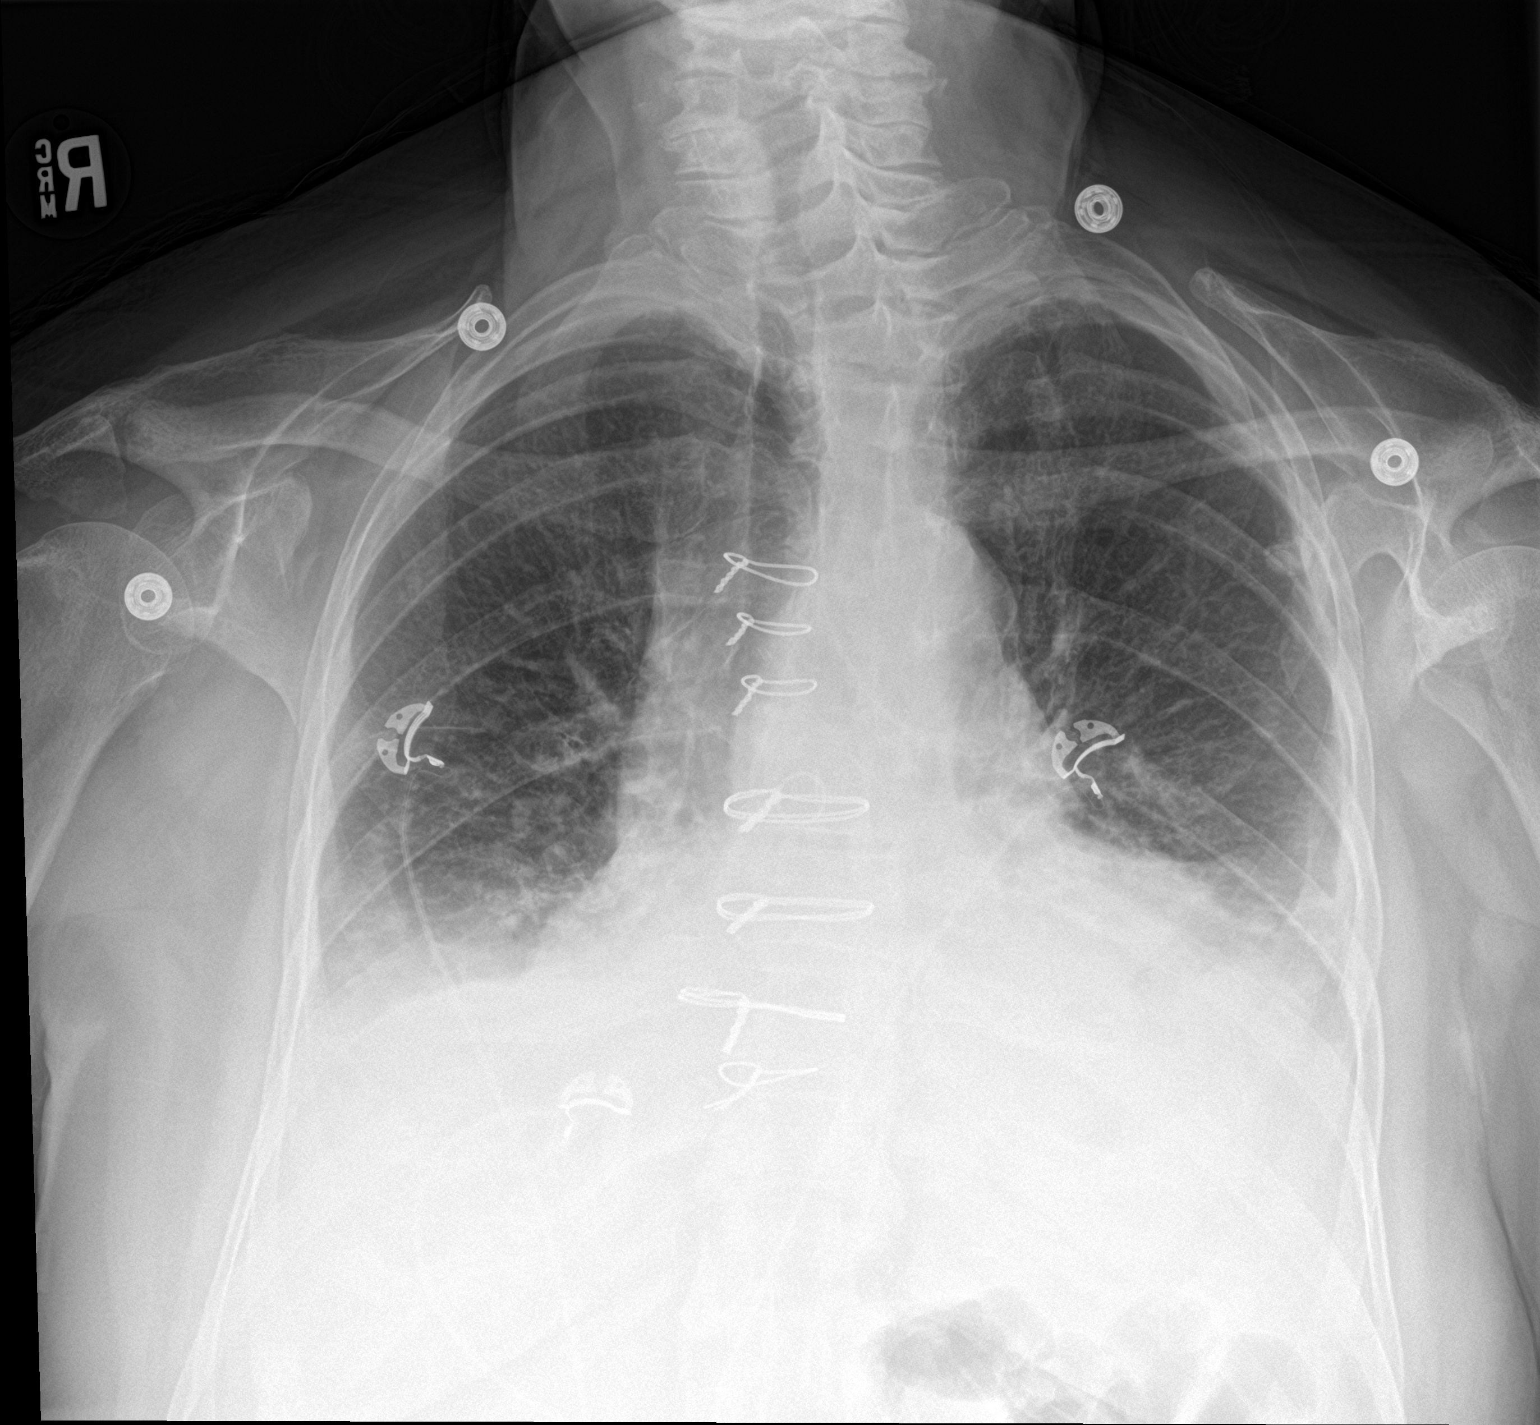

[chest lat]
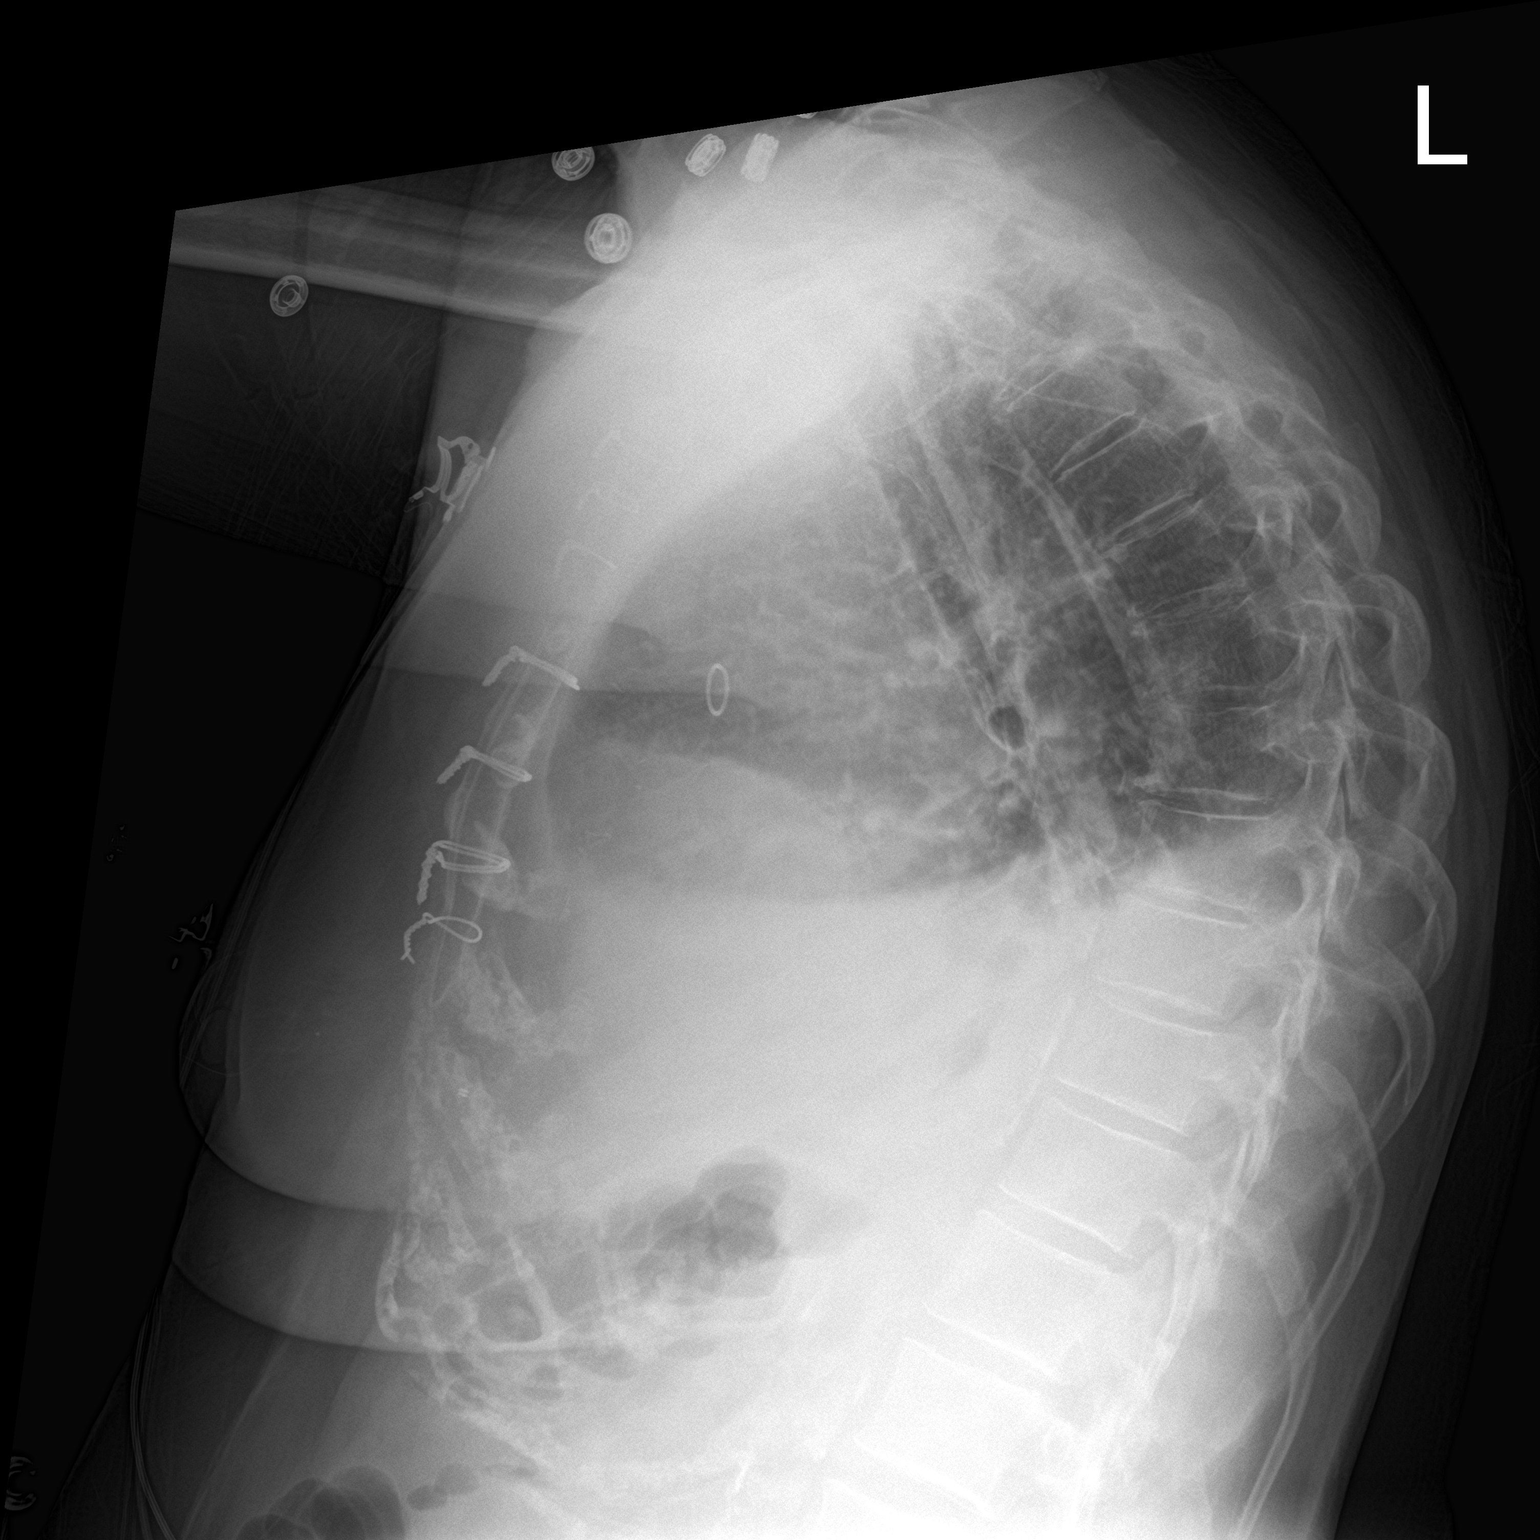

[2 of 2 positions shown; findings below may reference images not displayed]

FINDINGS: Stable cardiomegaly. Stable moderate bilateral pleural effusions are
noted with associated atelectasis. No pneumothorax is noted. Bony
thorax is unremarkable. Atherosclerosis of thoracic aorta is noted.
Sternotomy wires are noted.
IMPRESSION: Stable moderate pleural effusions are noted with associated
underlying atelectasis.

Aortic Atherosclerosis ([id number]-[id number].E).

## 2020-04-05 ENCOUNTER — Other Ambulatory Visit: Payer: Self-pay | Admitting: General Surgery

## 2020-04-05 DIAGNOSIS — K824 Cholesterolosis of gallbladder: Secondary | ICD-10-CM

## 2020-05-24 ENCOUNTER — Other Ambulatory Visit: Payer: Self-pay | Admitting: Family

## 2020-05-24 ENCOUNTER — Other Ambulatory Visit: Payer: Self-pay | Admitting: Family Medicine

## 2020-06-01 ENCOUNTER — Encounter: Payer: Self-pay | Admitting: Family Medicine

## 2020-06-01 ENCOUNTER — Other Ambulatory Visit: Payer: Self-pay

## 2020-06-01 ENCOUNTER — Ambulatory Visit (INDEPENDENT_AMBULATORY_CARE_PROVIDER_SITE_OTHER): Payer: BC Managed Care – PPO | Admitting: Family Medicine

## 2020-06-01 DIAGNOSIS — N2889 Other specified disorders of kidney and ureter: Secondary | ICD-10-CM

## 2020-06-01 DIAGNOSIS — E785 Hyperlipidemia, unspecified: Secondary | ICD-10-CM

## 2020-06-01 DIAGNOSIS — I151 Hypertension secondary to other renal disorders: Secondary | ICD-10-CM

## 2020-06-01 DIAGNOSIS — I5032 Chronic diastolic (congestive) heart failure: Secondary | ICD-10-CM | POA: Diagnosis not present

## 2020-06-01 DIAGNOSIS — T148XXA Other injury of unspecified body region, initial encounter: Secondary | ICD-10-CM | POA: Insufficient documentation

## 2020-06-01 DIAGNOSIS — R600 Localized edema: Secondary | ICD-10-CM | POA: Insufficient documentation

## 2020-06-01 LAB — COMPREHENSIVE METABOLIC PANEL
ALT: 22 U/L (ref 0–35)
AST: 22 U/L (ref 0–37)
Albumin: 4 g/dL (ref 3.5–5.2)
Alkaline Phosphatase: 77 U/L (ref 39–117)
BUN: 22 mg/dL (ref 6–23)
CO2: 25 mEq/L (ref 19–32)
Calcium: 9.1 mg/dL (ref 8.4–10.5)
Chloride: 106 mEq/L (ref 96–112)
Creatinine, Ser: 0.96 mg/dL (ref 0.40–1.20)
GFR: 59.98 mL/min — ABNORMAL LOW (ref >60.00)
Glucose, Bld: 93 mg/dL (ref 70–99)
Potassium: 3.9 mEq/L (ref 3.5–5.1)
Sodium: 140 mEq/L (ref 135–145)
Total Bilirubin: 0.7 mg/dL (ref 0.2–1.2)
Total Protein: 6.4 g/dL (ref 6.0–8.3)

## 2020-06-01 LAB — CBC
HCT: 38.7 % (ref 36.0–46.0)
Hemoglobin: 13.2 g/dL (ref 12.0–15.0)
MCHC: 34.2 g/dL (ref 30.0–36.0)
MCV: 94.6 fl (ref 78.0–100.0)
Platelets: 203 10*3/uL (ref 150.0–400.0)
RBC: 4.09 Mil/uL (ref 3.87–5.11)
RDW: 13.1 % (ref 11.5–15.5)
WBC: 6.4 10*3/uL (ref 4.0–10.5)

## 2020-06-01 LAB — TSH: TSH: 3.16 u[IU]/mL (ref 0.35–4.50)

## 2020-06-01 LAB — BRAIN NATRIURETIC PEPTIDE: Pro B Natriuretic peptide (BNP): 101 pg/mL — ABNORMAL HIGH (ref 0.0–100.0)

## 2020-06-01 NOTE — Assessment & Plan Note (Signed)
Well-controlled for the patient.  She will continue her current regimen of amlodipine 10 mg once daily, carvedilol 25 mg twice daily, hydralazine 25 mg 3 times daily, and losartan 100 mg daily.

## 2020-06-01 NOTE — Assessment & Plan Note (Addendum)
I suspect venous insufficiency given lack of other cardiac related symptoms.  Discussed that there are multiple other causes we need to evaluate for.  We will check labs to evaluate for other causes as outlined.  She does have a skin tear on the top of her right foot.  Advised that she should not use any compression stockings until that has healed up.  Advised to only wear compression stockings during the daytime.  Advised to elevate her legs is much as possible.

## 2020-06-01 NOTE — Assessment & Plan Note (Signed)
Continue Crestor 20 mg once daily.  Lipid panel is up-to-date.

## 2020-06-01 NOTE — Patient Instructions (Addendum)
Nice to see you. We will get lab work to evaluate for cause of your leg swelling. If you develop shortness of breath or chest pain please call [phone]. Please monitor the skin tear on the top of your right foot.  If its not healing please let us know.  If you develop redness, drainage of pus, or pain with this please let us know right away. Please try to elevate your legs is much as possible.  Please do not wear compression stockings on your right leg until the wound is healed.

## 2020-06-01 NOTE — Assessment & Plan Note (Signed)
Patient noted no trauma to produce this.  I advised that she could continue to use topical antibiotic ointment.  Discussed it may take several weeks to completely heal up.  Advised to seek medical attention for spreading redness, purulent drainage, or increasing pain.  Discussed if it was not improving she should let us know.

## 2020-06-01 NOTE — Assessment & Plan Note (Signed)
The patient has had some edema recently though does not have any other symptoms consistent with a CHF exacerbation or fluid retention.  We will check a BNP to evaluate for fluid retention.

## 2020-06-01 NOTE — Progress Notes (Signed)
Tommi Rumps, MD Phone: [phone]  Audrey Wolf is a 70 y.o. female who presents today for f/u.  HYPERTENSION  Disease Monitoring  Home BP Monitoring 130-140/70 Chest pain- no    Dyspnea- no Medications  Compliance-  Taking amlodipine, coreg, hydralazine, losartan.  Edema- see below  HYPERLIPIDEMIA Symptoms Chest pain on exertion:  no   Medications: Compliance- taking crestor Right upper quadrant pain- no  Muscle aches- no  Bilateral leg swelling: This has been going on for about 2 weeks.  She notes the swelling started first and then she started to develop some discomfort in her feet and lower legs with the swelling.  She soaks them with some benefit.  She does note soaking them with Epsom salts somehow led to a skin tear on the top of her right foot a couple of days ago.  She did not injure the area.  She has been using topical Neosporin on this.  She has not injured her legs.  She notes the swelling completely resolves with propping her legs up or overnight.  When she wakes up in the morning there is no swelling and no pain.  She has no orthopnea or PND.    Social History   Tobacco Use  Smoking Status Never Smoker  Smokeless Tobacco Never Used    Current Outpatient Medications on File Prior to Visit  Medication Sig Dispense Refill  . acetaminophen (TYLENOL) 500 MG tablet Take 1 tablet (500 mg total) by mouth daily as needed for mild pain or headache. 30 tablet 0  . albuterol (VENTOLIN HFA) 108 (90 Base) MCG/ACT inhaler Inhale 2 puffs into the lungs every 6 (six) hours as needed for wheezing or shortness of breath. 3 g 3  . amLODipine (NORVASC) 10 MG tablet Take 1 tablet by mouth once daily 90 tablet 0  . aspirin EC 325 MG EC tablet Take 1 tablet (325 mg total) by mouth daily. 30 tablet 0  . carvedilol (COREG) 25 MG tablet Take 1 tablet (25 mg total) by mouth 2 (two) times daily with a meal. 180 tablet 3  . famotidine (PEPCID) 20 MG tablet Take 1 tablet (20 mg  total) by mouth 2 (two) times daily. 60 tablet 1  . fluticasone (FLONASE) 50 MCG/ACT nasal spray Place 2 sprays into both nostrils daily. 16 g 6  . hydrALAZINE (APRESOLINE) 25 MG tablet Take 1 tablet (25 mg total) by mouth 3 (three) times daily. 90 tablet 1  . loratadine (CLARITIN) 10 MG tablet Take 10 mg by mouth daily.     Marland Kitchen losartan (COZAAR) 100 MG tablet Take 1 tablet (100 mg total) by mouth daily. 90 tablet 1  . Multiple Vitamins-Minerals (PRESERVISION AREDS 2) CAPS Take 2 capsules by mouth daily.    Marland Kitchen oxybutynin (DITROPAN-XL) 10 MG 24 hr tablet Take 1 tablet (10 mg total) by mouth at bedtime. 30 tablet 11  . rosuvastatin (CRESTOR) 20 MG tablet Take 1 tablet by mouth once daily 90 tablet 0   No current facility-administered medications on file prior to visit.     ROS see history of present illness  Objective  Physical Exam Vitals:   06/01/20 1014  BP: 140/70  Pulse: 73  Temp: 98.5 F (36.9 C)  SpO2: 99%    BP Readings from Last 3 Encounters:  06/01/20 140/70  11/29/19 121/70  08/25/19 (!) 131/66   Wt Readings from Last 3 Encounters:  06/01/20 228 lb 6.4 oz (103.6 kg)  11/29/19 228 lb 6.4 oz (103.6 kg)  08/25/19 (!) 229 lb (103.9 kg)    Physical Exam Constitutional:      General: She is not in acute distress.    Appearance: She is not diaphoretic.  Cardiovascular:     Rate and Rhythm: Normal rate and regular rhythm.     Heart sounds: Normal heart sounds.  Pulmonary:     Effort: Pulmonary effort is normal.     Breath sounds: Normal breath sounds.  Musculoskeletal:     Comments: 2+ pitting edema bilateral lower extremities to the upper shin, no calf tenderness, there is anterior left lower leg muscular tenderness  Skin:    General: Skin is warm and dry.     Comments: There is a skin tear on the dorsum of her right foot with no surrounding signs of infection, this is about the size of a half dollar  Neurological:     Mental Status: She is alert.       Assessment/Plan: Please see individual problem list.  Problem List Items Addressed This Visit    Hypertension    Well-controlled for the patient.  She will continue her current regimen of amlodipine 10 mg once daily, carvedilol 25 mg twice daily, hydralazine 25 mg 3 times daily, and losartan 100 mg daily.      Hyperlipidemia    Continue Crestor 20 mg once daily.  Lipid panel is up-to-date.      CHF (congestive heart failure) (HCC)    The patient has had some edema recently though does not have any other symptoms consistent with a CHF exacerbation or fluid retention.  We will check a BNP to evaluate for fluid retention.      Relevant Orders   B Nat Peptide   Bilateral lower extremity edema    I suspect venous insufficiency given lack of other cardiac related symptoms.  Discussed that there are multiple other causes we need to evaluate for.  We will check labs to evaluate for other causes as outlined.  She does have a skin tear on the top of her right foot.  Advised that she should not use any compression stockings until that has healed up.  Advised to only wear compression stockings during the daytime.  Advised to elevate her legs is much as possible.      Relevant Orders   Comp Met (CMET)   CBC   TSH   B Nat Peptide   Nontraumatic tear of skin    Patient noted no trauma to produce this.  I advised that she could continue to use topical antibiotic ointment.  Discussed it may take several weeks to completely heal up.  Advised to seek medical attention for spreading redness, purulent drainage, or increasing pain.  Discussed if it was not improving she should let us know.         This visit occurred during the SARS-CoV-2 public health emergency.  Safety protocols were in place, including screening questions prior to the visit, additional usage of staff PPE, and extensive cleaning of exam room while observing appropriate contact time as indicated for disinfecting solutions.     Tommi Rumps, MD [place]

## 2020-06-14 DIAGNOSIS — I10 Essential (primary) hypertension: Secondary | ICD-10-CM | POA: Diagnosis not present

## 2020-06-14 DIAGNOSIS — N1831 Chronic kidney disease, stage 3a: Secondary | ICD-10-CM | POA: Diagnosis not present

## 2020-06-14 DIAGNOSIS — N2581 Secondary hyperparathyroidism of renal origin: Secondary | ICD-10-CM | POA: Diagnosis not present

## 2020-06-14 DIAGNOSIS — N1330 Unspecified hydronephrosis: Secondary | ICD-10-CM | POA: Diagnosis not present

## 2020-06-19 ENCOUNTER — Other Ambulatory Visit: Payer: Self-pay

## 2020-06-19 ENCOUNTER — Ambulatory Visit (INDEPENDENT_AMBULATORY_CARE_PROVIDER_SITE_OTHER): Payer: BC Managed Care – PPO

## 2020-06-19 VITALS — BP 156/74 | HR 77

## 2020-06-19 DIAGNOSIS — I151 Hypertension secondary to other renal disorders: Secondary | ICD-10-CM | POA: Diagnosis not present

## 2020-06-19 DIAGNOSIS — N2889 Other specified disorders of kidney and ureter: Secondary | ICD-10-CM

## 2020-06-19 NOTE — Progress Notes (Signed)
Patient is here for a BP check due to bp being high at last visit, as per patient.  Currently patients BP is 156/77 and BPM is 77.  Patient has no complaints of headaches, blurry vision, chest pain, arm pain, light headedness, dizziness, and nor jaw pain. Please see previous note for order.

## 2020-06-20 ENCOUNTER — Telehealth: Payer: Self-pay

## 2020-06-20 NOTE — Telephone Encounter (Signed)
Left message for patient to return call back.   Message from Dr Birdie Sons.  BP is elevated. Please find out what her BP has been running at home.

## 2020-06-20 NOTE — Telephone Encounter (Signed)
The patient needs to start checking her blood pressure at home on a daily basis.  She saw her nephrologist recently and they did not change her blood pressure medication.  She should follow-up with me in a month or so for her blood pressure.

## 2020-06-20 NOTE — Telephone Encounter (Signed)
PT called to return the phone call from earlier.

## 2020-06-20 NOTE — Telephone Encounter (Signed)
Patient last took BP on Monday 06-18-20, BP was 156/72 she has not checked it since. She is currently at work and unable to check BP.

## 2020-06-20 NOTE — Telephone Encounter (Signed)
Left detailed message for patient informing her of the below.Marland Kitchen

## 2020-06-27 ENCOUNTER — Other Ambulatory Visit: Payer: Self-pay

## 2020-06-27 ENCOUNTER — Ambulatory Visit
Admission: RE | Admit: 2020-06-27 | Discharge: 2020-06-27 | Disposition: A | Discharge status: Home / Self Care | Payer: BC Managed Care – PPO | Source: Ambulatory Visit | Attending: General Surgery | Admitting: General Surgery

## 2020-06-27 DIAGNOSIS — K8020 Calculus of gallbladder without cholecystitis without obstruction: Secondary | ICD-10-CM | POA: Diagnosis not present

## 2020-06-27 DIAGNOSIS — K824 Cholesterolosis of gallbladder: Secondary | ICD-10-CM | POA: Insufficient documentation

## 2020-07-03 DIAGNOSIS — K824 Cholesterolosis of gallbladder: Secondary | ICD-10-CM | POA: Diagnosis not present

## 2020-07-03 DIAGNOSIS — R600 Localized edema: Secondary | ICD-10-CM | POA: Diagnosis not present

## 2020-07-12 ENCOUNTER — Telehealth: Payer: Self-pay | Admitting: Family Medicine

## 2020-07-12 NOTE — Telephone Encounter (Signed)
Left message for patient to call back and schedule Medicare Annual Wellness Visit (AWV) in office.   If not able to come in office, please offer to do virtually or by telephone.   Due for AWVI  Please schedule at anytime with Nurse Health Advisor.   

## 2020-08-23 ENCOUNTER — Other Ambulatory Visit: Payer: Self-pay | Admitting: Family Medicine

## 2020-08-30 ENCOUNTER — Ambulatory Visit (INDEPENDENT_AMBULATORY_CARE_PROVIDER_SITE_OTHER): Payer: BC Managed Care – PPO

## 2020-08-30 ENCOUNTER — Ambulatory Visit (INDEPENDENT_AMBULATORY_CARE_PROVIDER_SITE_OTHER): Payer: BC Managed Care – PPO | Admitting: Family Medicine

## 2020-08-30 ENCOUNTER — Other Ambulatory Visit: Payer: Self-pay

## 2020-08-30 ENCOUNTER — Encounter: Payer: Self-pay | Admitting: Family Medicine

## 2020-08-30 VITALS — BP 140/60 | HR 86 | Temp 98.6°F | Ht 69.02 in | Wt 232.6 lb

## 2020-08-30 DIAGNOSIS — N3281 Overactive bladder: Secondary | ICD-10-CM | POA: Diagnosis not present

## 2020-08-30 DIAGNOSIS — M4184 Other forms of scoliosis, thoracic region: Secondary | ICD-10-CM | POA: Diagnosis not present

## 2020-08-30 DIAGNOSIS — J4530 Mild persistent asthma, uncomplicated: Secondary | ICD-10-CM | POA: Diagnosis not present

## 2020-08-30 DIAGNOSIS — I151 Hypertension secondary to other renal disorders: Secondary | ICD-10-CM | POA: Diagnosis not present

## 2020-08-30 DIAGNOSIS — N2889 Other specified disorders of kidney and ureter: Secondary | ICD-10-CM

## 2020-08-30 DIAGNOSIS — M546 Pain in thoracic spine: Secondary | ICD-10-CM | POA: Insufficient documentation

## 2020-08-30 DIAGNOSIS — M47814 Spondylosis without myelopathy or radiculopathy, thoracic region: Secondary | ICD-10-CM | POA: Diagnosis not present

## 2020-08-30 MED ORDER — BACLOFEN 5 MG PO TABS: 5.0000 mg | ORAL_TABLET | Freq: Three times a day (TID) | ORAL | 0 refills | Status: DC | PRN

## 2020-08-30 MED ORDER — BUDESONIDE-FORMOTEROL FUMARATE 80-4.5 MCG/ACT IN AERO: 2.0000 | INHALATION_SPRAY | Freq: Two times a day (BID) | RESPIRATORY_TRACT | 3 refills | Status: DC

## 2020-08-30 NOTE — Assessment & Plan Note (Signed)
Adequate control today.  She will continue amlodipine 10 mg once daily, carvedilol 25 mg twice daily, hydralazine 25 mg 3 times daily, and losartan 100 mg daily.  She will bring in her blood pressure cuff next time for [place] to compare so we can make sure it is accurate.

## 2020-08-30 NOTE — Assessment & Plan Note (Signed)
Suspect muscular strain or spasm.  Given duration of symptoms we will get an x-ray to evaluate for underlying structural issues.  She can use baclofen on an as-needed basis for spasms.  Discussed that this could make her drowsy and she should not drive while taking this.  If it makes her excessively drowsy she will let us know.

## 2020-08-30 NOTE — Assessment & Plan Note (Signed)
I encouraged her to discuss alternative treatment options with her urologist.

## 2020-08-30 NOTE — Assessment & Plan Note (Signed)
Typically has issues this time a year with her asthma.  We will try her on Symbicort.  She will let us know if this is not affordable or if this is not beneficial.  She can continue as needed albuterol.

## 2020-08-30 NOTE — Patient Instructions (Signed)
Nice to see you. I sent Symbicort in to try for your asthma.  If this is too expensive please let me know. Please discuss the oxybutynin with your urologist. We will get an x-ray today for your back.  You can try the baclofen if you are having back spasms.  This may make you drowsy so please do not drive while taking it.

## 2020-08-30 NOTE — Progress Notes (Signed)
Audrey Alar, MD Phone: [phone]  Audrey Wolf is a 70 y.o. female who presents today for f/u.  HYPERTENSION Disease Monitoring Home BP Monitoring 168/80 yesterday Chest pain with exertion- no    Dyspnea- see below Medications Compliance-  taking coreg, amlodipine, hydralazine, losartan.  Edema- improved.  Asthma: Patient notes this time of year is difficult for her.  The heat gets her the most.  Anytime she walks outside she feels her airway is tightening up and she has some shortness of breath.  She does have some intermittent wheezing.  Albuterol helps improve her symptoms.  Overactive bladder: She has been on oxybutynin though she is not sure if it has been beneficial.  She gets up numerous times per night to urinate.  No urinary frequency during the day.  This has been an ongoing issue.  She tries not to drink anything after 6 PM.  She follows up with urology in September.  Left mid back pain: Patient notes this has been going on for a month.  She notes no injury.  Notes it occurs intermittently feels like somebody is stabbing her just lateral to her paraspinous muscles.  No radiation.  No numbness.  No weakness.   Social History   Tobacco Use  Smoking Status Never  Smokeless Tobacco Never    Current Outpatient Medications on File Prior to Visit  Medication Sig Dispense Refill   acetaminophen (TYLENOL) 500 MG tablet Take 1 tablet (500 mg total) by mouth daily as needed for mild pain or headache. 30 tablet 0   albuterol (VENTOLIN HFA) 108 (90 Base) MCG/ACT inhaler Inhale 2 puffs into the lungs every 6 (six) hours as needed for wheezing or shortness of breath. 3 g 3   amLODipine (NORVASC) 10 MG tablet Take 1 tablet by mouth once daily 90 tablet 0   aspirin EC 325 MG EC tablet Take 1 tablet (325 mg total) by mouth daily. 30 tablet 0   carvedilol (COREG) 25 MG tablet Take 1 tablet (25 mg total) by mouth 2 (two) times daily with a meal. 180 tablet 3   famotidine  (PEPCID) 20 MG tablet Take 1 tablet (20 mg total) by mouth 2 (two) times daily. 60 tablet 1   fluticasone (FLONASE) 50 MCG/ACT nasal spray Place 2 sprays into both nostrils daily. 16 g 6   hydrALAZINE (APRESOLINE) 25 MG tablet Take 1 tablet (25 mg total) by mouth 3 (three) times daily. 90 tablet 1   loratadine (CLARITIN) 10 MG tablet Take 10 mg by mouth daily.      losartan (COZAAR) 100 MG tablet Take 1 tablet (100 mg total) by mouth daily. 90 tablet 1   Multiple Vitamins-Minerals (PRESERVISION AREDS 2) CAPS Take 2 capsules by mouth daily.     oxybutynin (DITROPAN-XL) 10 MG 24 hr tablet Take 1 tablet (10 mg total) by mouth at bedtime. 30 tablet 11   rosuvastatin (CRESTOR) 20 MG tablet Take 1 tablet by mouth once daily 90 tablet 0   No current facility-administered medications on file prior to visit.     ROS see history of present illness  Objective  Physical Exam Vitals:   08/30/20 0855  BP: 140/60  Pulse: 86  Temp: 98.6 F (37 C)  SpO2: 96%    BP Readings from Last 3 Encounters:  08/30/20 140/60  06/19/20 (!) 156/74  06/01/20 140/70   Wt Readings from Last 3 Encounters:  08/30/20 232 lb 9.6 oz (105.5 kg)  06/01/20 228 lb 6.4 oz (103.6 kg)  11/29/19 228 lb 6.4 oz (103.6 kg)    Physical Exam Constitutional:      General: She is not in acute distress.    Appearance: She is not diaphoretic.  Cardiovascular:     Rate and Rhythm: Normal rate and regular rhythm.     Heart sounds: Normal heart sounds.  Pulmonary:     Effort: Pulmonary effort is normal.     Breath sounds: Normal breath sounds.  Musculoskeletal:       Back:  Skin:    General: Skin is warm and dry.  Neurological:     Mental Status: She is alert.     Assessment/Plan: Please see individual problem list.  Problem List Items Addressed This Visit     Acute left-sided thoracic back pain - Primary    Suspect muscular strain or spasm.  Given duration of symptoms we will get an x-ray to evaluate for  underlying structural issues.  She can use baclofen on an as-needed basis for spasms.  Discussed that this could make her drowsy and she should not drive while taking this.  If it makes her excessively drowsy she will let us know.       Relevant Medications   baclofen 5 MG TABS   Other Relevant Orders   DG Thoracic Spine W/Swimmers   Asthma    Typically has issues this time a year with her asthma.  We will try her on Symbicort.  She will let us know if this is not affordable or if this is not beneficial.  She can continue as needed albuterol.       Relevant Medications   budesonide-formoterol (SYMBICORT) 80-4.5 MCG/ACT inhaler   Hypertension    Adequate control today.  She will continue amlodipine 10 mg once daily, carvedilol 25 mg twice daily, hydralazine 25 mg 3 times daily, and losartan 100 mg daily.  She will bring in her blood pressure cuff next time for [place] to compare so we can make sure it is accurate.       OAB (overactive bladder)    I encouraged her to discuss alternative treatment options with her urologist.        Return in about 4 months (around 12/31/2020) for Hypertension.  This visit occurred during the SARS-CoV-2 public health emergency.  Safety protocols were in place, including screening questions prior to the visit, additional usage of staff PPE, and extensive cleaning of exam room while observing appropriate contact time as indicated for disinfecting solutions.    Audrey Alar, MD [facility]

## 2020-08-31 ENCOUNTER — Ambulatory Visit: Payer: BC Managed Care – PPO | Admitting: Family Medicine

## 2020-09-13 ENCOUNTER — Other Ambulatory Visit: Payer: Self-pay | Admitting: Family Medicine

## 2020-09-13 DIAGNOSIS — M546 Pain in thoracic spine: Secondary | ICD-10-CM

## 2020-09-13 DIAGNOSIS — G8929 Other chronic pain: Secondary | ICD-10-CM

## 2020-09-19 ENCOUNTER — Telehealth: Payer: Self-pay | Admitting: Family Medicine

## 2020-09-19 ENCOUNTER — Ambulatory Visit: Payer: BC Managed Care – PPO

## 2020-09-19 ENCOUNTER — Emergency Department: Payer: BC Managed Care – PPO

## 2020-09-19 ENCOUNTER — Encounter: Payer: Self-pay | Admitting: Emergency Medicine

## 2020-09-19 ENCOUNTER — Other Ambulatory Visit: Payer: Self-pay

## 2020-09-19 ENCOUNTER — Telehealth: Payer: Self-pay

## 2020-09-19 ENCOUNTER — Inpatient Hospital Stay
Admission: EM | Admit: 2020-09-19 | Discharge: 2020-09-22 | DRG: 309 | Disposition: A | Discharge status: Home / Self Care | Payer: BC Managed Care – PPO | Source: Ambulatory Visit | Attending: Student | Admitting: Student

## 2020-09-19 DIAGNOSIS — I252 Old myocardial infarction: Secondary | ICD-10-CM | POA: Diagnosis not present

## 2020-09-19 DIAGNOSIS — N179 Acute kidney failure, unspecified: Secondary | ICD-10-CM | POA: Diagnosis present

## 2020-09-19 DIAGNOSIS — I4891 Unspecified atrial fibrillation: Principal | ICD-10-CM | POA: Diagnosis present

## 2020-09-19 DIAGNOSIS — J45909 Unspecified asthma, uncomplicated: Secondary | ICD-10-CM | POA: Diagnosis present

## 2020-09-19 DIAGNOSIS — I480 Paroxysmal atrial fibrillation: Secondary | ICD-10-CM | POA: Diagnosis present

## 2020-09-19 DIAGNOSIS — Z79899 Other long term (current) drug therapy: Secondary | ICD-10-CM | POA: Diagnosis not present

## 2020-09-19 DIAGNOSIS — I130 Hypertensive heart and chronic kidney disease with heart failure and stage 1 through stage 4 chronic kidney disease, or unspecified chronic kidney disease: Secondary | ICD-10-CM | POA: Diagnosis present

## 2020-09-19 DIAGNOSIS — I5032 Chronic diastolic (congestive) heart failure: Secondary | ICD-10-CM | POA: Diagnosis present

## 2020-09-19 DIAGNOSIS — I2510 Atherosclerotic heart disease of native coronary artery without angina pectoris: Secondary | ICD-10-CM | POA: Diagnosis present

## 2020-09-19 DIAGNOSIS — E785 Hyperlipidemia, unspecified: Secondary | ICD-10-CM | POA: Diagnosis present

## 2020-09-19 DIAGNOSIS — N189 Chronic kidney disease, unspecified: Secondary | ICD-10-CM | POA: Diagnosis present

## 2020-09-19 DIAGNOSIS — Z8679 Personal history of other diseases of the circulatory system: Secondary | ICD-10-CM

## 2020-09-19 DIAGNOSIS — Z8261 Family history of arthritis: Secondary | ICD-10-CM

## 2020-09-19 DIAGNOSIS — N1831 Chronic kidney disease, stage 3a: Secondary | ICD-10-CM | POA: Diagnosis present

## 2020-09-19 DIAGNOSIS — R946 Abnormal results of thyroid function studies: Secondary | ICD-10-CM | POA: Diagnosis present

## 2020-09-19 DIAGNOSIS — Z20822 Contact with and (suspected) exposure to covid-19: Secondary | ICD-10-CM | POA: Diagnosis present

## 2020-09-19 DIAGNOSIS — N1339 Other hydronephrosis: Secondary | ICD-10-CM | POA: Diagnosis present

## 2020-09-19 DIAGNOSIS — I10 Essential (primary) hypertension: Secondary | ICD-10-CM | POA: Diagnosis present

## 2020-09-19 DIAGNOSIS — Z823 Family history of stroke: Secondary | ICD-10-CM

## 2020-09-19 DIAGNOSIS — Z951 Presence of aortocoronary bypass graft: Secondary | ICD-10-CM

## 2020-09-19 DIAGNOSIS — Z7951 Long term (current) use of inhaled steroids: Secondary | ICD-10-CM | POA: Diagnosis not present

## 2020-09-19 DIAGNOSIS — Z8249 Family history of ischemic heart disease and other diseases of the circulatory system: Secondary | ICD-10-CM

## 2020-09-19 DIAGNOSIS — Z7982 Long term (current) use of aspirin: Secondary | ICD-10-CM

## 2020-09-19 LAB — BASIC METABOLIC PANEL
Anion gap: 9 (ref 5–15)
BUN: 30 mg/dL — ABNORMAL HIGH (ref 8–23)
CO2: 23 mmol/L (ref 22–32)
Calcium: 9 mg/dL (ref 8.9–10.3)
Chloride: 107 mmol/L (ref 98–111)
Creatinine, Ser: 1.37 mg/dL — ABNORMAL HIGH (ref 0.44–1.00)
GFR, Estimated: 42 mL/min — ABNORMAL LOW (ref >60)
Glucose, Bld: 102 mg/dL — ABNORMAL HIGH (ref 70–99)
Potassium: 4.1 mmol/L (ref 3.5–5.1)
Sodium: 139 mmol/L (ref 135–145)

## 2020-09-19 LAB — CBC
HCT: 39.1 % (ref 36.0–46.0)
Hemoglobin: 13.5 g/dL (ref 12.0–15.0)
MCH: 32.8 pg (ref 26.0–34.0)
MCHC: 34.5 g/dL (ref 30.0–36.0)
MCV: 94.9 fL (ref 80.0–100.0)
Platelets: 218 10*3/uL (ref 150–400)
RBC: 4.12 MIL/uL (ref 3.87–5.11)
RDW: 12.8 % (ref 11.5–15.5)
WBC: 10.3 10*3/uL (ref 4.0–10.5)
nRBC: 0 % (ref 0.0–0.2)

## 2020-09-19 LAB — TSH: TSH: 5.879 u[IU]/mL — ABNORMAL HIGH (ref 0.350–4.500)

## 2020-09-19 LAB — MAGNESIUM: Magnesium: 2.2 mg/dL (ref 1.7–2.4)

## 2020-09-19 LAB — TROPONIN I (HIGH SENSITIVITY)
Troponin I (High Sensitivity): 4 ng/L (ref <18)
Troponin I (High Sensitivity): 5 ng/L (ref <18)

## 2020-09-19 LAB — RESP PANEL BY RT-PCR (FLU A&B, COVID) ARPGX2
Influenza A by PCR: NEGATIVE
Influenza B by PCR: NEGATIVE
SARS Coronavirus 2 by RT PCR: NEGATIVE

## 2020-09-19 LAB — BRAIN NATRIURETIC PEPTIDE: B Natriuretic Peptide: 385.9 pg/mL — ABNORMAL HIGH (ref 0.0–100.0)

## 2020-09-19 MED ORDER — ONDANSETRON HCL 4 MG PO TABS: 4.0000 mg | ORAL_TABLET | Freq: Four times a day (QID) | ORAL | Status: DC | PRN

## 2020-09-19 MED ORDER — ACETAMINOPHEN 650 MG RE SUPP: 650.0000 mg | Freq: Four times a day (QID) | RECTAL | Status: DC | PRN

## 2020-09-19 MED ORDER — ACETAMINOPHEN 325 MG PO TABS
650.0000 mg | ORAL_TABLET | Freq: Four times a day (QID) | ORAL | Status: DC | PRN
  Administered 2020-09-20: 650 mg via ORAL
  Filled 2020-09-19: qty 2

## 2020-09-19 MED ORDER — CARVEDILOL 25 MG PO TABS
25.0000 mg | ORAL_TABLET | Freq: Two times a day (BID) | ORAL | Status: DC
  Administered 2020-09-20 – 2020-09-22 (×4): 25 mg via ORAL
  Filled 2020-09-19 (×4): qty 1

## 2020-09-19 MED ORDER — ASPIRIN EC 81 MG PO TBEC
81.0000 mg | DELAYED_RELEASE_TABLET | Freq: Every day | ORAL | Status: DC
  Administered 2020-09-20 – 2020-09-21 (×2): 81 mg via ORAL
  Filled 2020-09-19 (×2): qty 1

## 2020-09-19 MED ORDER — FAMOTIDINE 20 MG PO TABS
20.0000 mg | ORAL_TABLET | Freq: Two times a day (BID) | ORAL | Status: DC
  Administered 2020-09-19 – 2020-09-22 (×6): 20 mg via ORAL
  Filled 2020-09-19 (×6): qty 1

## 2020-09-19 MED ORDER — DILTIAZEM HCL-DEXTROSE 125-5 MG/125ML-% IV SOLN (PREMIX)
5.0000 mg/h | INTRAVENOUS | Status: DC
  Administered 2020-09-19: 5 mg/h via INTRAVENOUS
  Administered 2020-09-20 (×2): 15 mg/h via INTRAVENOUS
  Filled 2020-09-19 (×3): qty 125

## 2020-09-19 MED ORDER — SENNOSIDES-DOCUSATE SODIUM 8.6-50 MG PO TABS: 1.0000 | ORAL_TABLET | Freq: Every evening | ORAL | Status: DC | PRN

## 2020-09-19 MED ORDER — ROSUVASTATIN CALCIUM 10 MG PO TABS
20.0000 mg | ORAL_TABLET | Freq: Every day | ORAL | Status: DC
  Administered 2020-09-20 – 2020-09-22 (×3): 20 mg via ORAL
  Filled 2020-09-19: qty 2
  Filled 2020-09-19: qty 1
  Filled 2020-09-19: qty 2

## 2020-09-19 MED ORDER — MOMETASONE FURO-FORMOTEROL FUM 100-5 MCG/ACT IN AERO
2.0000 | INHALATION_SPRAY | Freq: Two times a day (BID) | RESPIRATORY_TRACT | Status: DC
  Administered 2020-09-20: 2 via RESPIRATORY_TRACT
  Filled 2020-09-19: qty 8.8

## 2020-09-19 MED ORDER — SODIUM CHLORIDE 0.9% FLUSH
3.0000 mL | Freq: Two times a day (BID) | INTRAVENOUS | Status: DC
  Administered 2020-09-19 – 2020-09-22 (×4): 3 mL via INTRAVENOUS

## 2020-09-19 MED ORDER — APIXABAN 5 MG PO TABS
5.0000 mg | ORAL_TABLET | Freq: Two times a day (BID) | ORAL | Status: DC
  Administered 2020-09-19 – 2020-09-22 (×6): 5 mg via ORAL
  Filled 2020-09-19 (×6): qty 1

## 2020-09-19 MED ORDER — ONDANSETRON HCL 4 MG/2ML IJ SOLN: 4.0000 mg | Freq: Four times a day (QID) | INTRAMUSCULAR | Status: DC | PRN

## 2020-09-19 MED ORDER — ALBUTEROL SULFATE (2.5 MG/3ML) 0.083% IN NEBU: 3.0000 mL | INHALATION_SOLUTION | Freq: Four times a day (QID) | RESPIRATORY_TRACT | Status: DC | PRN

## 2020-09-19 NOTE — ED Notes (Signed)
Pt ambulatory to RR at this time. Unassisted. No distress noted.

## 2020-09-19 NOTE — ED Triage Notes (Signed)
Pt comes into the ED via EMS from [place] new onset a-fib, denies any sx. States she just went for a b/p check  HR 90-182 NS  140/82 100%RA [id number]

## 2020-09-19 NOTE — Telephone Encounter (Signed)
I was advised patient arrived and had high heart rate and pulse felt irregular, on visual patient looked well, no sweating, or heavy breathing, ask patient how she was feeling and if she was having any current chest pain, patient advised no . Advised patient her heart rate was concerning for being fast and ask if I could listen to her heart which upon auscultation first sounded irregular at time but would sound regular, attained EKG that and advised doc of the day of findings and DR. Contacted cardiology for advice while waiting attained second EKG and decision was made by MD to have [facility] and 911 was called . EMS arrived and EKG taken and decision to transport was made.

## 2020-09-19 NOTE — Telephone Encounter (Signed)
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 Please advise as she like to get her BP check today.

## 2020-09-19 NOTE — ED Notes (Signed)
Pt changed into gown, placed on cardiac monitor. EDP at bedside

## 2020-09-19 NOTE — Telephone Encounter (Signed)
Per pcp office they spoke with Dr. Mariah Milling and he is expecting a fax of patient ekg to review.   Scanned to chart and routed to Dr. Mariah Milling.   Also sent secure chat.   Per Jonny Ruiz at office patient was sent to ED.

## 2020-09-19 NOTE — Telephone Encounter (Signed)
Patient was here for a BP check due to bp being high at last visit, as per patient.  Currently patients BP is 101/68 and BPM is 133.  Patient has no complaints of headaches, blurry vision, arm pain, light headedness, dizziness, and nor jaw pain. Patient did say she had on and off chest pain on left side for the past two days. Patient has taken all medication within her chart. After having patient wait 10 minutes her pulse was still very elevated and informed Clinical RN which I was instructed to obtain an EKG.

## 2020-09-19 NOTE — ED Provider Notes (Signed)
[facility] Emergency Department Provider Note   ____________________________________________   Event Date/Time   First MD Initiated Contact with Patient 09/19/20 1613     (approximate)  I have reviewed the triage vital signs and the nursing notes.   HISTORY  Chief Complaint Atrial Fibrillation    HPI Audrey Wolf is a 70 y.o. female who presents from of our clinic after being found to be in new onset A. fib with RVR.  Patient states that she went to the clinic today for blood pressure check for possible change in her blood pressure medicines and was told that she was in new onset atrial fibrillation and needed to be transported to the emergency department.  Patient states that she does have dyspnea on exertion but is not worsened recently and states that it has been present since she was diagnosed with heart failure.  Patient states that she sees Dr. Welton Flakes for her cardiology care.  Patient denies any chest pain, sedentary shortness of breath, or palpitations          Past Medical History:  Diagnosis Date   Arthritis    Asthma    CHF (congestive heart failure) (HCC)    Chronic kidney disease    Coronary artery disease    GERD (gastroesophageal reflux disease)    Headache    Hypertension    Nephrolithiasis    NSTEMI (non-ST elevated myocardial infarction) (HCC)    Postprandial RUQ pain 05/25/2019    Patient Active Problem List   Diagnosis Date Noted   Acute left-sided thoracic back pain 08/30/2020   OAB (overactive bladder) 08/30/2020   Bilateral lower extremity edema 06/01/2020   Nontraumatic tear of skin 06/01/2020   Arthritis 11/29/2019   Calculus of gallbladder without cholecystitis without obstruction 06/03/2019   Gallbladder polyp 06/03/2019   Postprandial RUQ pain 05/25/2019   Obesity (BMI 30.0-34.9) 05/17/2019   Palpitations 05/17/2019   Chronic fatigue 11/12/2018   Asthma 07/05/2018   Prediabetes 03/31/2018    Hydronephrosis with ureteral stricture, not elsewhere classified 11/17/2017   Allergic rhinitis 11/10/2017   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   CAD (coronary artery disease), native coronary artery 05/19/2017   CHF (congestive heart failure) (HCC) 05/16/2017   Hypertension 06/05/2016   Hyperlipidemia 06/05/2016   GERD (gastroesophageal reflux disease) 06/05/2016   CKD (chronic kidney disease) stage 3, GFR 30-59 ml/min (HCC) 06/05/2016   Ureteral stricture, left 03/29/2014   Unilateral small kidney 03/02/2013   Abnormal liver enzymes 03/02/2012   Thyroid disease 05/05/2010   Epigastric pain 02/10/2010   Impaired fasting glucose 02/04/2007    Past Surgical History:  Procedure Laterality Date   ABDOMINAL HYSTERECTOMY     APPENDECTOMY     BREAST BIOPSY Left 12/31/2010   neg   CORONARY ARTERY BYPASS GRAFT N/A 05/22/2017   Procedure: CORONARY ARTERY BYPASS GRAFTING (CABG) x 2 WITH ENDOSCOPIC HARVESTING OF RIGHT SAPHENOUS VEIN;  Surgeon: Delight Ovens, MD;  Location: [facility] OR;  Service: Open Heart Surgery;  Laterality: N/A;   LEFT HEART CATH AND CORONARY ANGIOGRAPHY N/A 05/18/2017   Procedure: LEFT HEART CATH AND CORONARY ANGIOGRAPHY;  Surgeon: Laurier Nancy, MD;  Location: [facility] INVASIVE CV LAB;  Service: Cardiovascular;  Laterality: N/A;   LITHOTRIPSY     TEE WITHOUT CARDIOVERSION N/A 05/22/2017   Procedure: TRANSESOPHAGEAL ECHOCARDIOGRAM (TEE);  Surgeon: Delight Ovens, MD;  Location: [facility] OR;  Service: Open Heart Surgery;  Laterality: N/A;   TONSILLECTOMY      Prior to  Admission medications   Medication Sig Start Date End Date Taking? Authorizing Provider  acetaminophen (TYLENOL) 500 MG tablet Take 1 tablet (500 mg total) by mouth daily as needed for mild pain or headache. 05/27/17   Ardelle Balls, PA-C  albuterol (VENTOLIN HFA) 108 (90 Base) MCG/ACT inhaler Inhale 2 puffs into the lungs every 6 (six) hours as needed for wheezing or shortness of breath. 08/31/18   Delma Freeze, FNP   amLODipine (NORVASC) 10 MG tablet Take 1 tablet by mouth once daily 08/23/20   Glori Luis, MD  aspirin EC 325 MG EC tablet Take 1 tablet (325 mg total) by mouth daily. 05/27/17   Ardelle Balls, PA-C  baclofen 5 MG TABS Take 5 mg by mouth 3 (three) times daily as needed for muscle spasms. 08/30/20   Glori Luis, MD  budesonide-formoterol [facility]) 80-4.5 MCG/ACT inhaler Inhale 2 puffs into the lungs 2 (two) times daily. 08/30/20   Glori Luis, MD  carvedilol (COREG) 25 MG tablet Take 1 tablet (25 mg total) by mouth 2 (two) times daily with a meal. 02/15/19   Glori Luis, MD  famotidine (PEPCID) 20 MG tablet Take 1 tablet (20 mg total) by mouth 2 (two) times daily. 05/25/19   Theadore Nan, NP  fluticasone (FLONASE) 50 MCG/ACT nasal spray Place 2 sprays into both nostrils daily. 11/10/17   Glori Luis, MD  hydrALAZINE (APRESOLINE) 25 MG tablet Take 1 tablet (25 mg total) by mouth 3 (three) times daily. 02/16/19   Glori Luis, MD  loratadine (CLARITIN) 10 MG tablet Take 10 mg by mouth daily.     [provider]  losartan (COZAAR) 100 MG tablet Take 1 tablet (100 mg total) by mouth daily. 07/25/18   Glori Luis, MD  Multiple Vitamins-Minerals (PRESERVISION AREDS 2) CAPS Take 2 capsules by mouth daily.    [provider]  oxybutynin (DITROPAN-XL) 10 MG 24 hr tablet Take 1 tablet (10 mg total) by mouth at bedtime. 08/25/19   Sondra Come, MD  rosuvastatin (CRESTOR) 20 MG tablet Take 1 tablet by mouth once daily 08/23/20   Glori Luis, MD    Allergies Lisinopril and Ace inhibitors  Family History  Problem Relation Age of Onset   Arthritis Mother    Arthritis Father    Heart disease Father    Stroke Father    Sudden Cardiac Death Father    Breast cancer Maternal Aunt     Social History Social History   Tobacco Use   Smoking status: Never   Smokeless tobacco: Never  Vaping Use   Vaping Use: Never used   Substance Use Topics   Alcohol use: No   Drug use: No    Review of Systems Constitutional: No fever/chills Eyes: No visual changes. ENT: No sore throat. Cardiovascular: Denies chest pain. Respiratory: Endorses dyspnea on exertion but denies current shortness of breath Gastrointestinal: No abdominal pain.  No nausea, no vomiting.  No diarrhea. Genitourinary: Negative for dysuria. Musculoskeletal: Negative for acute arthralgias Skin: Negative for rash. Neurological: Negative for headaches, weakness/numbness/paresthesias in any extremity Psychiatric: Negative for suicidal ideation/homicidal ideation   ____________________________________________   PHYSICAL EXAM:  VITAL SIGNS: ED Triage Vitals  Enc Vitals Group     BP 09/19/20 1543 (!) 146/87     Pulse Rate 09/19/20 1543 (!) 120     Resp 09/19/20 1543 18     Temp 09/19/20 1543 98.3 F (36.8 C)  Temp Source 09/19/20 1543 Oral     SpO2 09/19/20 1543 100 %     Weight 09/19/20 1544 232 lb 9.4 oz (105.5 kg)     Height 09/19/20 1544 5\' 9"  (1.753 m)     Head Circumference --      Peak Flow --      Pain Score 09/19/20 1543 0     Pain Loc --      Pain Edu? --      Excl. in GC? --    Constitutional: Alert and oriented. Well appearing and in no acute distress. Eyes: Conjunctivae are normal. PERRL. Head: Atraumatic. Nose: No congestion/rhinnorhea. Mouth/Throat: Mucous membranes are moist. Neck: No stridor Cardiovascular: Grossly normal heart sounds.  Good peripheral circulation. Respiratory: Normal respiratory effort.  No retractions. Gastrointestinal: Soft and nontender. No distention. Musculoskeletal: No obvious deformities Neurologic:  Normal speech and language. No gross focal neurologic deficits are appreciated. Skin:  Skin is warm and dry. No rash noted. Psychiatric: Mood and affect are normal. Speech and behavior are normal.  ____________________________________________   LABS (all labs ordered are listed, but  only abnormal results are displayed)  Labs Reviewed  BASIC METABOLIC PANEL - Abnormal; Notable for the following components:      Result Value   Glucose, Bld 102 (*)    BUN 30 (*)    Creatinine, Ser 1.37 (*)    GFR, Estimated 42 (*)    All other components within normal limits  RESP PANEL BY RT-PCR (FLU A&B, COVID) ARPGX2  CBC  BRAIN NATRIURETIC PEPTIDE  TROPONIN I (HIGH SENSITIVITY)   ____________________________________________  EKG  ED ECG REPORT I, 09/21/20, the attending physician, personally viewed and interpreted this ECG.  Date: 09/19/2020 EKG Time: 1543 Rate: 140 Rhythm: Atrial fibrillation with rapid ventricular response QRS Axis: normal Intervals: normal ST/T Wave abnormalities: normal Narrative Interpretation: Atrial fibrillation with rapid ventricular response.  No evidence of acute ischemia  ____________________________________________  RADIOLOGY  ED MD interpretation: 2 view chest x-ray shows no evidence of acute abnormalities including no pneumonia, pneumothorax, or widened mediastinum  Official radiology report(s): DG Chest 2 View  Result Date: 09/19/2020 CLINICAL DATA:  New onset AFib EXAM: CHEST - 2 VIEW COMPARISON:  08/27/2017 FINDINGS: Post sternotomy changes. No focal opacity or pleural effusion. Normal cardiomediastinal silhouette with aortic atherosclerosis. Stable calcified nodule in the peripheral left upper lobe. IMPRESSION: No active cardiopulmonary disease. Electronically Signed   By: 08/29/2017 M.D.   On: 09/19/2020 16:28    ____________________________________________   PROCEDURES  Procedure(s) performed (including Critical Care):  .1-3 Lead EKG Interpretation  Date/Time: 09/19/2020 4:47 PM Performed by: 09/21/2020, MD Authorized by: Merwyn Katos, MD     Interpretation: abnormal     ECG rate:  121   ECG rate assessment: tachycardic     Rhythm: atrial fibrillation     Ectopy: none     Conduction: normal    .Critical Care  Date/Time: 09/19/2020 4:47 PM Performed by: 09/21/2020, MD Authorized by: Merwyn Katos, MD   Critical care provider statement:    Critical care time (minutes):  33   Critical care time was exclusive of:  Separately billable procedures and treating other patients   Critical care was necessary to treat or prevent imminent or life-threatening deterioration of the following conditions:  Circulatory failure and cardiac failure   Critical care was time spent personally by me on the following activities:  Discussions with consultants, evaluation of patient's response  to treatment, examination of patient, ordering and performing treatments and interventions, ordering and review of laboratory studies, ordering and review of radiographic studies, pulse oximetry, re-evaluation of patient's condition, obtaining history from patient or surrogate and review of old charts   I assumed direction of critical care for this patient from another provider in my specialty: no     Care discussed with: admitting provider     ____________________________________________   INITIAL IMPRESSION / ASSESSMENT AND PLAN / ED COURSE  As part of my medical decision making, I reviewed the following data within the electronic medical record, if available:  Nursing notes reviewed and incorporated, Labs reviewed, EKG interpreted, Old chart reviewed, Radiograph reviewed and Notes from prior ED visits reviewed and incorporated     + atrial fibrillation w/ RVR DDx: Pneumothorax, Pneumonia, Pulmonary Embolus, Tamponade, ACS, Thyrotoxicosis.  No history or evidence decompensated heart failure. Given their history and exam it is likely this patient is unlikely to spontaneously revert to a rate controlled rhythm and necessitates a thorough workup for their arrhythmia. Workup: ECG, CXR, CBC, BMP, UA, Troponin, BNP, TSH, Ca-Mag-Phos Interventions: Defer Cardioversion (uncertain historical reliability with time  of onset, increased risk of thromboembolic stroke).  Start diltiazem bolus and drip  Disposition: Admit     ____________________________________________   FINAL CLINICAL IMPRESSION(S) / ED DIAGNOSES  Final diagnoses:  Atrial fibrillation with rapid ventricular response (HCC)  History of CHF (congestive heart failure)     ED Discharge Orders     None        Note:  This document was prepared using Dragon voice recognition software and may include unintentional dictation errors.    Merwyn KatosBradler, Silas Sedam K, MD 09/19/20 [phone]

## 2020-09-19 NOTE — Telephone Encounter (Signed)
I called and spoke with the patient and made her a nurse visit today to check her BP in comparison to [facility] reading she got earlier today.  Aemilia Dedrick,cma

## 2020-09-19 NOTE — H&P (Signed)
History and Physical    MAEGHAN Wolf GNF:[id number] DOB: Feb 08, 1950 DOA: 09/19/2020  PCP: Leone Haven, MD  Patient coming from: Home  I have personally briefly reviewed patient's old medical records in [name]  Chief Complaint: Rapid heart rate  HPI: Audrey Wolf is a 70 y.o. female with medical history significant for CAD s/p CABG, chronic diastolic CHF (EF 84% April 2019), CKD stage IIIa, HTN, HLD, chronic left hydronephrosis who presented to the ED for evaluation of rapid heart rate, found to have A. fib with RVR.  Patient states she was checking her blood pressure while at work today when she noticed that her pulse was in the 160s.  She called her PCP and went to the office for recheck.  She was again noted to be tachycardic.  EKG was obtained and showed atrial fibrillation with RVR.  Patient was subsequently advised to come to the ED for further evaluation.  Patient denies any palpitations.  She reports intermittent chest pain chronically but not recently.  She reports chronic dyspnea on exertion due to underlying asthma which is also unchanged from baseline.  She has not had any lightheadedness/dizziness, nausea, vomiting, abdominal pain, dysuria, or lower extremity edema.  She denies any obvious bleeding.  She denies any recent changes in her medications.  ED Course:  Initial vitals showed BP 146/87, pulse 120, RR 18, temp 98.3 F, SPO2 100% on room air.  Labs show sodium 139, potassium 4.1, bicarb 23, BUN 30, creatinine 1.37, EGFR 42, serum glucose 102, WBC 10.3, hemoglobin 13.5, platelets 218,000, BNP 385.9, high-sensitivity troponin 4.  SARS-CoV-2 PCR panel was negative.  Influenza is negative.  2 view chest x-ray shows prior sternotomy changes, stable calcified nodule in the peripheral left upper lobe.  No focal consolidation, edema, or effusion.  EKG showed atrial fibrillation with RVR, rate 140.  Patient was started on IV diltiazem infusion and  the hospitalist service was consulted to admit for further evaluation and management.  Review of Systems: All systems reviewed and are negative except as documented in history of present illness above.   Past Medical History:  Diagnosis Date   Arthritis    Asthma    CHF (congestive heart failure) (HCC)    Chronic kidney disease    Coronary artery disease    GERD (gastroesophageal reflux disease)    Headache    Hypertension    Nephrolithiasis    NSTEMI (non-ST elevated myocardial infarction) ([place])    Postprandial RUQ pain 05/25/2019    Past Surgical History:  Procedure Laterality Date   ABDOMINAL HYSTERECTOMY     APPENDECTOMY     BREAST BIOPSY Left 12/31/2010   neg   CORONARY ARTERY BYPASS GRAFT N/A 05/22/2017   Procedure: CORONARY ARTERY BYPASS GRAFTING (CABG) x 2 WITH ENDOSCOPIC HARVESTING OF RIGHT SAPHENOUS VEIN;  Surgeon: Grace Isaac, MD;  Location: [place];  Service: Open Heart Surgery;  Laterality: N/A;   LEFT HEART CATH AND CORONARY ANGIOGRAPHY N/A 05/18/2017   Procedure: LEFT HEART CATH AND CORONARY ANGIOGRAPHY;  Surgeon: Dionisio David, MD;  Location: [place] [facility];  Service: Cardiovascular;  Laterality: N/A;   LITHOTRIPSY     TEE WITHOUT CARDIOVERSION N/A 05/22/2017   Procedure: TRANSESOPHAGEAL ECHOCARDIOGRAM (TEE);  Surgeon: Grace Isaac, MD;  Location: [place];  Service: Open Heart Surgery;  Laterality: N/A;   TONSILLECTOMY      Social History:  reports that she has never smoked. She has never used smokeless tobacco. She  reports that she does not drink alcohol and does not use drugs.  Allergies  Allergen Reactions   Lisinopril Cough   Ace Inhibitors Other (See Comments)    Has been told to avoid these and any diuretics because "left kidney has shrunk up and doesn't work"    Family History  Problem Relation Age of Onset   Arthritis Mother    Arthritis Father    Heart disease Father    Stroke Father    Sudden Cardiac Death Father    Breast  cancer Maternal Aunt      Prior to Admission medications   Medication Sig Start Date End Date Taking? Authorizing Provider  acetaminophen (TYLENOL) 500 MG tablet Take 1 tablet (500 mg total) by mouth daily as needed for mild pain or headache. 05/27/17   Nani Skillern, PA-C  amLODipine (NORVASC) 10 MG tablet Take 1 tablet by mouth once daily 08/23/20   Leone Haven, MD  aspirin EC 325 MG EC tablet Take 1 tablet (325 mg total) by mouth daily. 05/27/17   Nani Skillern, PA-C  baclofen 5 MG TABS Take 5 mg by mouth 3 (three) times daily as needed for muscle spasms. 08/30/20   Leone Haven, MD  budesonide-formoterol [facility]) 80-4.5 MCG/ACT inhaler Inhale 2 puffs into the lungs 2 (two) times daily. 08/30/20   Leone Haven, MD  loratadine (CLARITIN) 10 MG tablet Take 10 mg by mouth daily.     [provider]  losartan (COZAAR) 100 MG tablet Take 1 tablet (100 mg total) by mouth daily. 07/25/18   Leone Haven, MD  Multiple Vitamins-Minerals (PRESERVISION AREDS 2) CAPS Take 2 capsules by mouth daily.    [provider]  rosuvastatin (CRESTOR) 20 MG tablet Take 1 tablet by mouth once daily 08/23/20   Leone Haven, MD    Physical Exam: Vitals:   09/19/20 1700 09/19/20 1715 09/19/20 1738 09/19/20 1815  BP: 133/77 104/73 119/83 (!) 127/95  Pulse: (!) 107 (!) 127 82 85  Resp: _0 Temp:      TempSrc:      SpO2: 96% 98% 100% 99%  Weight:      Height:       Constitutional: Resting in bed, NAD, calm, comfortable Eyes: PERRL, lids and conjunctivae normal ENMT: Mucous membranes are moist. Posterior pharynx clear of any exudate or lesions.Normal dentition.  Neck: normal, supple, no masses. Respiratory: clear to auscultation bilaterally, no wheezing, no crackles. Normal respiratory effort. No accessory muscle use.  Cardiovascular: Irregularly irregular, tachycardic, no murmurs / rubs / gallops. No extremity edema. 2+ pedal  pulses. Abdomen: no tenderness, no masses palpated. No hepatosplenomegaly. Bowel sounds positive.  Musculoskeletal: no clubbing / cyanosis. No joint deformity upper and lower extremities. Good ROM, no contractures. Normal muscle tone.  Skin: no rashes, lesions, ulcers. No induration Neurologic: CN 2-12 grossly intact. Sensation intact. Strength 5/5 in all 4.  Psychiatric: Normal judgment and insight. Alert and oriented x 3. Normal mood.   Labs on Admission: I have personally reviewed following labs and imaging studies  CBC: Recent Labs  Lab 09/19/20 1547  WBC 10.3  HGB 13.5  HCT 39.1  MCV 94.9  PLT 638   Basic Metabolic Panel: Recent Labs  Lab 09/19/20 1547  NA 139  K 4.1  CL 107  CO2 23  GLUCOSE 102*  BUN 30*  CREATININE 1.37*  CALCIUM 9.0   GFR: Estimated Creatinine Clearance: 49.4 mL/min (A) (by C-G formula  based on SCr of 1.37 mg/dL (H)). Liver Function Tests: No results for input(s): AST, ALT, ALKPHOS, BILITOT, PROT, ALBUMIN in the last 168 hours. No results for input(s): LIPASE, AMYLASE in the last 168 hours. No results for input(s): AMMONIA in the last 168 hours. Coagulation Profile: No results for input(s): INR, PROTIME in the last 168 hours. Cardiac Enzymes: No results for input(s): CKTOTAL, CKMB, CKMBINDEX, TROPONINI in the last 168 hours. BNP (last 3 results) Recent Labs    06/01/20 1030  PROBNP 101.0*   HbA1C: No results for input(s): HGBA1C in the last 72 hours. CBG: No results for input(s): GLUCAP in the last 168 hours. Lipid Profile: No results for input(s): CHOL, HDL, LDLCALC, TRIG, CHOLHDL, LDLDIRECT in the last 72 hours. Thyroid Function Tests: No results for input(s): TSH, T4TOTAL, FREET4, T3FREE, THYROIDAB in the last 72 hours. Anemia Panel: No results for input(s): VITAMINB12, FOLATE, FERRITIN, TIBC, IRON, RETICCTPCT in the last 72 hours. Urine analysis:    Component Value Date/Time   COLORURINE YELLOW 09/07/2018 1403   APPEARANCEUR  [place] Cloud[place] (A) 09/07/2018 1403   LABSPEC >=1.030 (A) 09/07/2018 1403   PHURINE 6.0 09/07/2018 1403   GLUCOSEU NEGATIVE 09/07/2018 1403   HGBUR NEGATIVE 09/07/2018 1403   HGBUR trace-intact 02/10/2010 1223   BILIRUBINUR NEGATIVE 09/07/2018 1403   BILIRUBINUR neg 08/27/2018 0921   KETONESUR NEGATIVE 09/07/2018 1403   PROTEINUR Negative 08/27/2018 0921   PROTEINUR NEGATIVE 05/21/2017 0504   UROBILINOGEN 2.0 (A) 09/07/2018 1403   NITRITE NEGATIVE 09/07/2018 1403   LEUKOCYTESUR SMALL (A) 09/07/2018 1403    Radiological Exams on Admission: DG Chest 2 View  Result Date: 09/19/2020 CLINICAL DATA:  New onset AFib EXAM: CHEST - 2 VIEW COMPARISON:  08/27/2017 FINDINGS: Post sternotomy changes. No focal opacity or pleural effusion. Normal cardiomediastinal silhouette with aortic atherosclerosis. Stable calcified nodule in the peripheral left upper lobe. IMPRESSION: No active cardiopulmonary disease. Electronically Signed   By: Donavan Foil M.D.   On: 09/19/2020 16:28    EKG: Personally reviewed.  Atrial fibrillation with RVR, rate 140.  Assessment/Plan Principal Problem:   Atrial fibrillation with RVR (HCC) Active Problems:   Hypertension   Hyperlipidemia   CAD (coronary artery disease), native coronary artery   Chronic diastolic CHF (congestive heart failure) (HCC)   Acute kidney injury superimposed on CKD ([place])   ANGELYNA HENDERSON is a 70 y.o. female with medical history significant for CAD s/p CABG, chronic diastolic CHF (EF 92% April 2019), CKD stage IIIa, HTN, HLD, chronic left hydronephrosis who is admitted with new A. fib with RVR.  Atrial fibrillation with RVR: New diagnosis.  CHA2DS2-VASc score is at least 5.  Discussed increased stroke risk and patient is agreeable to start oral anticoagulation.  She has been started on IV diltiazem infusion. -Continue IV diltiazem, transition to oral as able -Resume home Coreg 25 mg BID -Start Eliquis 5 mg twice daily -Update  echocardiogram -Monitor on telemetry  AKI on CKD stage IIIa: Likely prerenal from hypoperfusion in setting of A. fib with RVR.  Has known chronic severe left hydronephrosis with likely poorly functioning left kidney. -Continue management as above -Hold home losartan for now  CAD s/p CABG: Denies any chest pain.  Reduce home aspirin from 325 mg to 81 mg daily now that we are starting Eliquis.  Continue Coreg and Crestor.  Chronic diastolic CHF: No evidence of acute decompensation.  Update echocardiogram and continue to monitor.  Hypertension: Currently controlled.  On IV diltiazem as above.  Continue  home Coreg.  Holding losartan, hydralazine, amlodipine while on IV diltiazem.  Asthma: Continue Symbicort.  Hyperlipidemia: Continue rosuvastatin.  DVT prophylaxis: Eliquis Code Status: Full code, confirmed with patient Family Communication: Discussed with patient, she has discussed with family Disposition Plan: From home and likely discharged home pending clinical progress Consults called: None Level of care: Progressive Cardiac Admission status:  Status is: Observation  The patient remains OBS appropriate and will d/c before 2 midnights.  Dispo: The patient is from: Home              Anticipated d/c is to: Home              Patient currently is not medically stable to d/c.   Difficult to place patient No  Zada Finders MD [facility]  If 7PM-7AM, please contact night-coverage [email or web address]  09/19/2020, 6:26 PM

## 2020-09-19 NOTE — ED Triage Notes (Signed)
Pt comes into the ED via [facility] from her MD's office with c/o new onset a-fib with rates from 120-145.  Pt denies any CP, SHOB, dizziness, and weakness.  Pt does admit that she has been a little more tired at the end of the day than normal.  Pt currently has even and unlabored respirations and is in NAD.

## 2020-09-20 DIAGNOSIS — I5032 Chronic diastolic (congestive) heart failure: Secondary | ICD-10-CM | POA: Diagnosis present

## 2020-09-20 DIAGNOSIS — N179 Acute kidney failure, unspecified: Secondary | ICD-10-CM | POA: Diagnosis present

## 2020-09-20 DIAGNOSIS — Z7982 Long term (current) use of aspirin: Secondary | ICD-10-CM | POA: Diagnosis not present

## 2020-09-20 DIAGNOSIS — Z7951 Long term (current) use of inhaled steroids: Secondary | ICD-10-CM | POA: Diagnosis not present

## 2020-09-20 DIAGNOSIS — I4891 Unspecified atrial fibrillation: Secondary | ICD-10-CM | POA: Diagnosis present

## 2020-09-20 DIAGNOSIS — N1831 Chronic kidney disease, stage 3a: Secondary | ICD-10-CM | POA: Diagnosis present

## 2020-09-20 DIAGNOSIS — Z20822 Contact with and (suspected) exposure to covid-19: Secondary | ICD-10-CM | POA: Diagnosis present

## 2020-09-20 DIAGNOSIS — I252 Old myocardial infarction: Secondary | ICD-10-CM | POA: Diagnosis not present

## 2020-09-20 DIAGNOSIS — Z8249 Family history of ischemic heart disease and other diseases of the circulatory system: Secondary | ICD-10-CM | POA: Diagnosis not present

## 2020-09-20 DIAGNOSIS — J45909 Unspecified asthma, uncomplicated: Secondary | ICD-10-CM | POA: Diagnosis present

## 2020-09-20 DIAGNOSIS — I130 Hypertensive heart and chronic kidney disease with heart failure and stage 1 through stage 4 chronic kidney disease, or unspecified chronic kidney disease: Secondary | ICD-10-CM | POA: Diagnosis present

## 2020-09-20 DIAGNOSIS — Z823 Family history of stroke: Secondary | ICD-10-CM | POA: Diagnosis not present

## 2020-09-20 DIAGNOSIS — E785 Hyperlipidemia, unspecified: Secondary | ICD-10-CM | POA: Diagnosis present

## 2020-09-20 DIAGNOSIS — Z79899 Other long term (current) drug therapy: Secondary | ICD-10-CM | POA: Diagnosis not present

## 2020-09-20 DIAGNOSIS — Z951 Presence of aortocoronary bypass graft: Secondary | ICD-10-CM | POA: Diagnosis not present

## 2020-09-20 DIAGNOSIS — Z8261 Family history of arthritis: Secondary | ICD-10-CM | POA: Diagnosis not present

## 2020-09-20 DIAGNOSIS — I2510 Atherosclerotic heart disease of native coronary artery without angina pectoris: Secondary | ICD-10-CM | POA: Diagnosis present

## 2020-09-20 DIAGNOSIS — N1339 Other hydronephrosis: Secondary | ICD-10-CM | POA: Diagnosis present

## 2020-09-20 DIAGNOSIS — R946 Abnormal results of thyroid function studies: Secondary | ICD-10-CM | POA: Diagnosis present

## 2020-09-20 LAB — CBC
HCT: 39.5 % (ref 36.0–46.0)
Hemoglobin: 13.7 g/dL (ref 12.0–15.0)
MCH: 32.5 pg (ref 26.0–34.0)
MCHC: 34.7 g/dL (ref 30.0–36.0)
MCV: 93.8 fL (ref 80.0–100.0)
Platelets: 203 10*3/uL (ref 150–400)
RBC: 4.21 MIL/uL (ref 3.87–5.11)
RDW: 13 % (ref 11.5–15.5)
WBC: 7 10*3/uL (ref 4.0–10.5)
nRBC: 0 % (ref 0.0–0.2)

## 2020-09-20 LAB — BASIC METABOLIC PANEL
Anion gap: 10 (ref 5–15)
BUN: 25 mg/dL — ABNORMAL HIGH (ref 8–23)
CO2: 25 mmol/L (ref 22–32)
Calcium: 9.1 mg/dL (ref 8.9–10.3)
Chloride: 105 mmol/L (ref 98–111)
Creatinine, Ser: 0.93 mg/dL (ref 0.44–1.00)
GFR, Estimated: >60 mL/min (ref >60)
Glucose, Bld: 106 mg/dL — ABNORMAL HIGH (ref 70–99)
Potassium: 3.8 mmol/L (ref 3.5–5.1)
Sodium: 140 mmol/L (ref 135–145)

## 2020-09-20 LAB — HIV ANTIBODY (ROUTINE TESTING W REFLEX): HIV Screen 4th Generation wRfx: NONREACTIVE

## 2020-09-20 MED ORDER — AMIODARONE HCL IN DEXTROSE 360-4.14 MG/200ML-% IV SOLN
60.0000 mg/h | INTRAVENOUS | Status: AC
  Administered 2020-09-20: 60 mg/h via INTRAVENOUS
  Filled 2020-09-20: qty 200

## 2020-09-20 MED ORDER — DILTIAZEM HCL 30 MG PO TABS
30.0000 mg | ORAL_TABLET | Freq: Four times a day (QID) | ORAL | Status: DC
  Administered 2020-09-20 – 2020-09-22 (×9): 30 mg via ORAL
  Filled 2020-09-20 (×9): qty 1

## 2020-09-20 MED ORDER — AMIODARONE HCL IN DEXTROSE 360-4.14 MG/200ML-% IV SOLN
30.0000 mg/h | INTRAVENOUS | Status: DC
  Administered 2020-09-20 – 2020-09-22 (×4): 30 mg/h via INTRAVENOUS
  Filled 2020-09-20 (×4): qty 200

## 2020-09-20 NOTE — Consult Note (Signed)
Audrey Wolf is a 70 y.o. female  [id number]  Primary Cardiologist: Adrian Blackwater, MD Reason for Consultation: atrial fibrillation with RVR  HPI: Audrey Wolf is a 70 y.o. female with medical history significant for CAD s/p CABG, chronic diastolic CHF (EF 62% April 2019), CKD stage IIIa, HTN, HLD, chronic left hydronephrosis who presented to the ED for evaluation of rapid heart rate, found to have A. fib with RVR.  Review of Systems: Patient denies chest pain, shortness of breath, dizziness. Denies feeling palpitations and fast heart. Patient reports feeling fatigued.    Past Medical History:  Diagnosis Date   Arthritis    Asthma    CHF (congestive heart failure) (HCC)    Chronic kidney disease    Coronary artery disease    GERD (gastroesophageal reflux disease)    Headache    Hypertension    Nephrolithiasis    NSTEMI (non-ST elevated myocardial infarction) (HCC)    Postprandial RUQ pain 05/25/2019    (Not in a hospital admission)     apixaban  5 mg Oral BID   aspirin EC  81 mg Oral Daily   carvedilol  25 mg Oral BID WC   diltiazem  30 mg Oral Q6H   famotidine  20 mg Oral BID   mometasone-formoterol  2 puff Inhalation BID   rosuvastatin  20 mg Oral Daily   sodium chloride flush  3 mL Intravenous Q12H    Infusions:  amiodarone 60 mg/hr (09/20/20 1027)   Followed by   amiodarone      Allergies  Allergen Reactions   Lisinopril Cough   Ace Inhibitors Other (See Comments)    Has been told to avoid these and any diuretics because "left kidney has shrunk up and doesn't work"    Social History   Socioeconomic History   Marital status: Single    Spouse name: Not on file   Number of children: Not on file   Years of education: Not on file   Highest education level: Not on file  Occupational History    Employer: [employer]  Tobacco Use   Smoking status: Never   Smokeless tobacco: Never  Vaping Use   Vaping Use: Never used  Substance and Sexual  Activity   Alcohol use: No   Drug use: No   Sexual activity: Not Currently    Partners: Male    Birth control/protection: Post-menopausal  Other Topics Concern   Not on file  Social History Narrative   Not on file   Social Determinants of Health   Financial Resource Strain: Not on file  Food Insecurity: Not on file  Transportation Needs: Not on file  Physical Activity: Not on file  Stress: Not on file  Social Connections: Not on file  Intimate Partner Violence: Not on file    Family History  Problem Relation Age of Onset   Arthritis Mother    Arthritis Father    Heart disease Father    Stroke Father    Sudden Cardiac Death Father    Breast cancer Maternal Aunt     PHYSICAL EXAM: Vitals:   09/20/20 1000 09/20/20 1030  BP: (!) 149/76   Pulse: 78 74  Resp: 19 16  Temp:    SpO2: 99% 98%     Intake/Output Summary (Last 24 hours) at 09/20/2020 1058 Last data filed at 09/20/2020 1022 Gross per 24 hour  Intake 246.53 ml  Output --  Net 246.53 ml    General:  Well appearing. No respiratory difficulty HEENT: normal Neck: supple. no JVD. Carotids 2+ bilat; no bruits. No lymphadenopathy or thryomegaly appreciated. Cor: PMI nondisplaced. Regular rate & rhythm. No rubs, gallops or murmurs. Lungs: clear Abdomen: soft, nontender, nondistended. No hepatosplenomegaly. No bruits or masses. Good bowel sounds. Extremities: no cyanosis, clubbing, rash, edema Neuro: alert & oriented x 3, cranial nerves grossly intact. moves all 4 extremities w/o difficulty. Affect pleasant.  ECG: Atrial fibrillation with RVR, HR 144 bpm, Non-specific ST changes.  Results for orders placed or performed during the hospital encounter of 09/19/20 (from the past 24 hour(s))  Basic metabolic panel     Status: Abnormal   Collection Time: 09/19/20  3:47 PM  Result Value Ref Range   Sodium 139 135 - 145 mmol/L   Potassium 4.1 3.5 - 5.1 mmol/L   Chloride 107 98 - 111 mmol/L   CO2 23 22 - 32 mmol/L    Glucose, Bld 102 (H) 70 - 99 mg/dL   BUN 30 (H) 8 - 23 mg/dL   Creatinine, Ser 0.93 (H) 0.44 - 1.00 mg/dL   Calcium 9.0 8.9 - 23.5 mg/dL   GFR, Estimated 42 (L) >60 mL/min   Anion gap 9 5 - 15  CBC     Status: None   Collection Time: 09/19/20  3:47 PM  Result Value Ref Range   WBC 10.3 4.0 - 10.5 K/uL   RBC 4.12 3.87 - 5.11 MIL/uL   Hemoglobin 13.5 12.0 - 15.0 g/dL   HCT 57.3 22.0 - 25.4 %   MCV 94.9 80.0 - 100.0 fL   MCH 32.8 26.0 - 34.0 pg   MCHC 34.5 30.0 - 36.0 g/dL   RDW 27.0 62.3 - 76.2 %   Platelets 218 150 - 400 K/uL   nRBC 0.0 0.0 - 0.2 %  Magnesium     Status: None   Collection Time: 09/19/20  3:47 PM  Result Value Ref Range   Magnesium 2.2 1.7 - 2.4 mg/dL  TSH     Status: Abnormal   Collection Time: 09/19/20  3:47 PM  Result Value Ref Range   TSH 5.879 (H) 0.350 - 4.500 uIU/mL  Troponin I (High Sensitivity)     Status: None   Collection Time: 09/19/20  4:51 PM  Result Value Ref Range   Troponin I (High Sensitivity) 4 <18 ng/L  Brain natriuretic peptide     Status: Abnormal   Collection Time: 09/19/20  4:51 PM  Result Value Ref Range   B Natriuretic Peptide 385.9 (H) 0.0 - 100.0 pg/mL  Resp Panel by RT-PCR (Flu A&B, Covid) Nasopharyngeal Swab     Status: None   Collection Time: 09/19/20  4:51 PM   Specimen: Nasopharyngeal Swab; Nasopharyngeal(NP) swabs in vial transport medium  Result Value Ref Range   SARS Coronavirus 2 by RT PCR NEGATIVE NEGATIVE   Influenza A by PCR NEGATIVE NEGATIVE   Influenza B by PCR NEGATIVE NEGATIVE  Troponin I (High Sensitivity)     Status: None   Collection Time: 09/19/20  6:55 PM  Result Value Ref Range   Troponin I (High Sensitivity) 5 <18 ng/L  Basic metabolic panel     Status: Abnormal   Collection Time: 09/20/20  6:47 AM  Result Value Ref Range   Sodium 140 135 - 145 mmol/L   Potassium 3.8 3.5 - 5.1 mmol/L   Chloride 105 98 - 111 mmol/L   CO2 25 22 - 32 mmol/L   Glucose, Bld 106 (H) 70 -  99 mg/dL   BUN 25 (H) 8 - 23  mg/dL   Creatinine, Ser 2.97 0.44 - 1.00 mg/dL   Calcium 9.1 8.9 - 98.9 mg/dL   GFR, Estimated >21 >19 mL/min   Anion gap 10 5 - 15  CBC     Status: None   Collection Time: 09/20/20  6:47 AM  Result Value Ref Range   WBC 7.0 4.0 - 10.5 K/uL   RBC 4.21 3.87 - 5.11 MIL/uL   Hemoglobin 13.7 12.0 - 15.0 g/dL   HCT 41.7 40.8 - 14.4 %   MCV 93.8 80.0 - 100.0 fL   MCH 32.5 26.0 - 34.0 pg   MCHC 34.7 30.0 - 36.0 g/dL   RDW 81.8 56.3 - 14.9 %   Platelets 203 150 - 400 K/uL   nRBC 0.0 0.0 - 0.2 %   DG Chest 2 View  Result Date: 09/19/2020 CLINICAL DATA:  New onset AFib EXAM: CHEST - 2 VIEW COMPARISON:  08/27/2017 FINDINGS: Post sternotomy changes. No focal opacity or pleural effusion. Normal cardiomediastinal silhouette with aortic atherosclerosis. Stable calcified nodule in the peripheral left upper lobe. IMPRESSION: No active cardiopulmonary disease. Electronically Signed   By: Jasmine Pang M.D.   On: 09/19/2020 16:28     ASSESSMENT AND PLAN: Patient with new onset atrial fibrillation with RVR. IV diltiazem changed to IV amiodarone for rate and rhythm control, continue oral diltiazem. Restart hydralazine at home dose.   Museum/gallery conservator FNP-C

## 2020-09-20 NOTE — Progress Notes (Signed)
[facility] Progress Note  Patient: Audrey Wolf    PJK:[id number]  DOA: 09/19/2020     Date of Service: the patient was seen and examined on 09/20/2020  Chief Complaint  Patient presents with   Atrial Fibrillation   Brief hospital course: Audrey Wolf is a 70 y.o. female with medical history significant for CAD s/p CABG, chronic diastolic CHF (EF 80% April 2019), CKD stage IIIa, HTN, HLD, chronic left hydronephrosis who presented to the ED for evaluation of rapid heart rate, found to have A. fib with RVR. ED Course:  Initial vitals showed BP 146/87, pulse 120, RR 18, temp 98.3 F, SPO2 100% on room air.   Labs show sodium 139, potassium 4.1, bicarb 23, BUN 30, creatinine 1.37, EGFR 42, serum glucose 102, WBC 10.3, hemoglobin 13.5, platelets 218,000, BNP 385.9, high-sensitivity troponin 4.   SARS-CoV-2 PCR panel was negative.  Influenza is negative.   2 view chest x-ray shows prior sternotomy changes, stable calcified nodule in the peripheral left upper lobe.  No focal consolidation, edema, or effusion.   EKG showed atrial fibrillation with RVR, rate 140.   Patient was started on IV diltiazem infusion and the hospitalist service was consulted to admit for further evaluation and management.   Assessment and Plan: Principal Problem:   Atrial fibrillation with RVR ([place]) Active Problems:   Hypertension   Hyperlipidemia   CAD (coronary artery disease), native coronary artery   Chronic diastolic CHF (congestive heart failure) (HCC)   Acute kidney injury superimposed on CKD ([place])   Audrey Wolf is a 70 y.o. female with medical history significant for CAD s/p CABG, chronic diastolic CHF (EF 99% April 2019), CKD stage IIIa, HTN, HLD, chronic left hydronephrosis who is admitted with new A. fib with RVR.   Atrial fibrillation with RVR: New diagnosis.  CHA2DS2-VASc score is at least 5.  Discussed increased stroke risk and patient is agreeable to start oral  anticoagulation.  She has been started on IV diltiazem infusion. -s/p IV diltiazem infusion, added Cardizem 30 mg p.o. every 6 hourly -Resume home Coreg 25 mg BID -Start Eliquis 5 mg twice daily, risk and benefit discussed with patient Cardiology consulted, patient was started on amiodarone infusion -Update echocardiogram -Monitor on telemetry    AKI on CKD stage IIIa: Likely prerenal from hypoperfusion in setting of A. fib with RVR.  Has known chronic severe left hydronephrosis with likely poorly functioning left kidney. -Continue management as above -Hold home losartan for now   CAD s/p CABG: Denies any chest pain.  Reduce home aspirin from 325 mg to 81 mg daily now that we are starting Eliquis.  Continue Coreg and Crestor.   Chronic diastolic CHF: No evidence of acute decompensation.  Update echocardiogram and continue to monitor.   Hypertension: Currently controlled.  On IV diltiazem as above.  Continue home Coreg.  Holding losartan, hydralazine, amlodipine while on IV diltiazem.   Asthma: Continue Symbicort.   Hyperlipidemia: Continue rosuvastatin.   Elevated TSH level, recommend to follow with PCP as an outpatient for thyroid function test, repeat in 4 weeks  Body mass index is 34.35 kg/m.  Interventions:       Diet: Heart healthy DVT Prophylaxis: Therapeutic Anticoagulation with Eliquis    Advance goals of care discussion: Full code  Family Communication: family was present at bedside, at the time of interview.  The pt provided permission to discuss medical plan with the family. Opportunity was given to ask question and all questions were  answered satisfactorily.   Disposition:  Pt is from Home, admitted with A. Fib RVR, still has  A.fib , which precludes a safe discharge. Discharge to Home , when A. fib will be under control and cleared by cardiology.  Subjective: No overnight events, patient presented with A. fib with RVR, heart rate is under control now,  still on IV infusion.  Patient denied any palpitation, no chest pain, no shortness of breath.    Physical Exam: General:  alert oriented to time, place, and person.  Appear in no distress, affect appropriate Eyes: PERRLA ENT: Oral Mucosa Clear, moist  Neck: no JVD,  Cardiovascular: S1 and S2 Present, no Murmur, irregular rhythm Respiratory: good respiratory effort, Bilateral Air entry equal and Decreased, no Crackles, no wheezes Abdomen: Bowel Sound present, Soft and no tenderness,  Skin: no rashes Extremities: no Pedal edema, no calf tenderness Neurologic: without any new focal findings Gait not checked due to patient safety concerns  Vitals:   09/20/20 1552 09/20/20 1553 09/20/20 1600 09/20/20 1615  BP:   (!) 144/87   Pulse: 81 (!) 103 88   Resp:      Temp:      TempSrc:      SpO2: 97% 95%  97%  Weight:      Height:        Intake/Output Summary (Last 24 hours) at 09/20/2020 1656 Last data filed at 09/20/2020 1638 Gross per 24 hour  Intake 444.46 ml  Output --  Net 444.46 ml   Filed Weights   09/19/20 1544  Weight: 105.5 kg    Data Reviewed: I have personally reviewed and interpreted daily labs, tele strips, imagings as discussed above. I reviewed all nursing notes, pharmacy notes, vitals, pertinent old records I have discussed plan of care as described above with RN and patient/family.  CBC: Recent Labs  Lab 09/19/20 1547 09/20/20 0647  WBC 10.3 7.0  HGB 13.5 13.7  HCT 39.1 39.5  MCV 94.9 93.8  PLT 218 035   Basic Metabolic Panel: Recent Labs  Lab 09/19/20 1547 09/20/20 0647  NA 139 140  K 4.1 3.8  CL 107 105  CO2 23 25  GLUCOSE 102* 106*  BUN 30* 25*  CREATININE 1.37* 0.93  CALCIUM 9.0 9.1  MG 2.2  --     Studies: No results found.  Scheduled Meds:  apixaban  5 mg Oral BID   aspirin EC  81 mg Oral Daily   carvedilol  25 mg Oral BID WC   diltiazem  30 mg Oral Q6H   famotidine  20 mg Oral BID   mometasone-formoterol  2 puff Inhalation  BID   rosuvastatin  20 mg Oral Daily   sodium chloride flush  3 mL Intravenous Q12H   Continuous Infusions:  amiodarone 30 mg/hr (09/20/20 1640)   PRN Meds: acetaminophen **OR** acetaminophen, albuterol, ondansetron **OR** ondansetron (ZOFRAN) IV, senna-docusate  Time spent: 35 minutes  Author: Val Riles. MD [facility] 09/20/2020 4:56 PM  To reach On-call, see care teams to locate the attending and reach out to them via [email or web address]. If 7PM-7AM, please contact night-coverage If you still have difficulty reaching the attending provider, please page the [facility] (Director on Call) for [facility] on amion for assistance.

## 2020-09-20 NOTE — Consult Note (Signed)
  Amiodarone Drug - Drug Interaction Consult Note  Recommendations: Patient is currently ordered rosuvastatin 20mg . Spoke to patient regarding reporting any muscle pain or weakness immediately. Will continue apixaban 5mg  BID and monitor hemoglobin and CBC daily. Cardiologist is aware of potential bradycardia and/or myocardial depression with both diltiazem and carvedilol but believes benefits currently outweigh risks and will continue continue current medications.  Amiodarone is metabolized by the cytochrome P450 system and therefore has the potential to cause many drug interactions. Amiodarone has an average plasma half-life of 50 days (range 20 to 100 days).   There is potential for drug interactions to occur several weeks or months after stopping treatment and the onset of drug interactions may be slow after initiating amiodarone.   [x]  Statins: Increased risk of myopathy. Simvastatin- restrict dose to 20mg  daily. Other statins: counsel patients to report any muscle pain or weakness immediately.  [x]  Anticoagulants: Amiodarone can increase anticoagulant effect. Consider warfarin dose reduction. Patients should be monitored closely and the dose of anticoagulant altered accordingly, remembering that amiodarone levels take several weeks to stabilize.  []  Antiepileptics: Amiodarone can increase plasma concentration of phenytoin, the dose should be reduced. Note that small changes in phenytoin dose can result in large changes in levels. Monitor patient and counsel on signs of toxicity.  [x]  Beta blockers: increased risk of bradycardia, AV block and myocardial depression. Sotalol - avoid concomitant use.  [x]   Calcium channel blockers (diltiazem and verapamil): increased risk of bradycardia, AV block and myocardial depression.  []   Cyclosporine: Amiodarone increases levels of cyclosporine. Reduced dose of cyclosporine is recommended.  []  Digoxin dose should be halved when amiodarone is  started.  []  Diuretics: increased risk of cardiotoxicity if hypokalemia occurs.  []  Oral hypoglycemic agents (glyburide, glipizide, glimepiride): increased risk of hypoglycemia. Patient's glucose levels should be monitored closely when initiating amiodarone therapy.   []  Drugs that prolong the QT interval:  Torsades de pointes risk may be increased with concurrent use - avoid if possible.  Monitor QTc, also keep magnesium/potassium WNL if concurrent therapy can't be avoided.  Antibiotics: e.g. fluoroquinolones, erythromycin.  Antiarrhythmics: e.g. quinidine, procainamide, disopyramide, sotalol.  Antipsychotics: e.g. phenothiazines, haloperidol.   Lithium, tricyclic antidepressants, and methadone. Thank You,   09/20/2020 11:18 AM

## 2020-09-21 ENCOUNTER — Inpatient Hospital Stay: Admit: 2020-09-21 | Payer: BC Managed Care – PPO

## 2020-09-21 LAB — CBC
HCT: 35.6 % — ABNORMAL LOW (ref 36.0–46.0)
Hemoglobin: 12.7 g/dL (ref 12.0–15.0)
MCH: 34 pg (ref 26.0–34.0)
MCHC: 35.7 g/dL (ref 30.0–36.0)
MCV: 95.4 fL (ref 80.0–100.0)
Platelets: 200 10*3/uL (ref 150–400)
RBC: 3.73 MIL/uL — ABNORMAL LOW (ref 3.87–5.11)
RDW: 12.8 % (ref 11.5–15.5)
WBC: 7.3 10*3/uL (ref 4.0–10.5)
nRBC: 0 % (ref 0.0–0.2)

## 2020-09-21 LAB — BASIC METABOLIC PANEL
Anion gap: 7 (ref 5–15)
BUN: 22 mg/dL (ref 8–23)
CO2: 25 mmol/L (ref 22–32)
Calcium: 8.7 mg/dL — ABNORMAL LOW (ref 8.9–10.3)
Chloride: 107 mmol/L (ref 98–111)
Creatinine, Ser: 1.18 mg/dL — ABNORMAL HIGH (ref 0.44–1.00)
GFR, Estimated: 50 mL/min — ABNORMAL LOW (ref >60)
Glucose, Bld: 98 mg/dL (ref 70–99)
Potassium: 3.8 mmol/L (ref 3.5–5.1)
Sodium: 139 mmol/L (ref 135–145)

## 2020-09-21 LAB — T4, FREE: Free T4: 0.89 ng/dL (ref 0.61–1.12)

## 2020-09-21 LAB — PHOSPHORUS: Phosphorus: 4.3 mg/dL (ref 2.5–4.6)

## 2020-09-21 LAB — MAGNESIUM: Magnesium: 2.1 mg/dL (ref 1.7–2.4)

## 2020-09-21 MED ORDER — POTASSIUM CHLORIDE CRYS ER 20 MEQ PO TBCR
20.0000 meq | EXTENDED_RELEASE_TABLET | Freq: Once | ORAL | Status: AC
  Administered 2020-09-21: 20 meq via ORAL
  Filled 2020-09-21: qty 1

## 2020-09-21 NOTE — Progress Notes (Signed)
[facility] Progress Note  Patient: Audrey Wolf    KKX:[id number]  DOA: 09/19/2020     Date of Service: the patient was seen and examined on 09/21/2020  Chief Complaint  Patient presents with   Atrial Fibrillation   Brief hospital course: Audrey Wolf is a 70 y.o. female with medical history significant for CAD s/p CABG, chronic diastolic CHF (EF 16% April 2019), CKD stage IIIa, HTN, HLD, chronic left hydronephrosis who presented to the ED for evaluation of rapid heart rate, found to have A. fib with RVR. ED Course:  Initial vitals showed BP 146/87, pulse 120, RR 18, temp 98.3 F, SPO2 100% on room air.   Labs show sodium 139, potassium 4.1, bicarb 23, BUN 30, creatinine 1.37, EGFR 42, serum glucose 102, WBC 10.3, hemoglobin 13.5, platelets 218,000, BNP 385.9, high-sensitivity troponin 4.   SARS-CoV-2 PCR panel was negative.  Influenza is negative.   2 view chest x-ray shows prior sternotomy changes, stable calcified nodule in the peripheral left upper lobe.  No focal consolidation, edema, or effusion.   EKG showed atrial fibrillation with RVR, rate 140.   Patient was started on IV diltiazem infusion and the hospitalist service was consulted to admit for further evaluation and management.   Assessment and Plan: Principal Problem:   Atrial fibrillation with RVR ([place]) Active Problems:   Hypertension   Hyperlipidemia   CAD (coronary artery disease), native coronary artery   Chronic diastolic CHF (congestive heart failure) (HCC)   Acute kidney injury superimposed on CKD ([place])   Audrey Wolf is a 70 y.o. female with medical history significant for CAD s/p CABG, chronic diastolic CHF (EF 96% April 2019), CKD stage IIIa, HTN, HLD, chronic left hydronephrosis who is admitted with new A. fib with RVR.   Atrial fibrillation with RVR: New diagnosis.  CHA2DS2-VASc score is at least 5.  Discussed increased stroke risk and patient is agreeable to start oral  anticoagulation.  She has been started on IV diltiazem infusion. -s/p IV diltiazem infusion, added Cardizem 30 mg p.o. every 6 hourly -Resume home Coreg 25 mg BID -Started Eliquis 5 mg twice daily, risk and benefit discussed with patient Cardiology consulted, patient was started on amiodarone infusion -f/u Echocardiogram -Monitor on telemetry    AKI on CKD stage IIIa: Likely prerenal from hypoperfusion in setting of A. fib with RVR.  Has known chronic severe left hydronephrosis with likely poorly functioning left kidney. -Continue management as above -Hold home losartan for now Cr 1.18 slightly elevated  CAD s/p CABG: Denies any chest pain.  Reduce home aspirin from 325 mg to 81 mg daily now that we are starting Eliquis.  Continue Coreg and Crestor.   Chronic diastolic CHF: No evidence of acute decompensation.  Update echocardiogram and continue to monitor.   Hypertension: Currently controlled.  On IV diltiazem as above.  Continue home Coreg.  Holding losartan, hydralazine, amlodipine while on IV diltiazem.   Asthma: Continue Symbicort.   Hyperlipidemia: Continue rosuvastatin.   Elevated TSH level, recommend to follow with PCP as an outpatient for thyroid function test, repeat in 4 weeks.  Free T4 level 0.89 within normal range  Body mass index is 34.35 kg/m.  Interventions:       Diet: Heart healthy DVT Prophylaxis: Therapeutic Anticoagulation with Eliquis    Advance goals of care discussion: Full code  Family Communication: family was present at bedside, at the time of interview.  The pt provided permission to discuss medical plan with the  family. Opportunity was given to ask question and all questions were answered satisfactorily.   Disposition:  Pt is from Home, admitted with A. Fib RVR, still has  A.fib, still on amiodarone infusion which precludes a safe discharge. Discharge to Home , when A. fib will be under control and cleared by cardiology.  Subjective: No  overnight events, patient still has A. fib, rate is controlled now, and any chest palpitations, no shortness of breath.    Physical Exam: General:  alert oriented to time, place, and person.  Appear in no distress, affect appropriate Eyes: PERRLA ENT: Oral Mucosa Clear, moist  Neck: no JVD,  Cardiovascular: S1 and S2 Present, no Murmur, irregular rhythm Respiratory: good respiratory effort, Bilateral Air entry equal and Decreased, no Crackles, no wheezes Abdomen: Bowel Sound present, Soft and no tenderness,  Skin: no rashes Extremities: no Pedal edema, no calf tenderness Neurologic: without any new focal findings Gait not checked due to patient safety concerns  Vitals:   09/20/20 2347 09/21/20 0348 09/21/20 0915 09/21/20 1117  BP: (!) 138/107  (!) 132/58 112/86  Pulse: 81  83 71  Resp:   18 17  Temp: 97.9 F (36.6 C)  98.2 F (36.8 C) 98.1 F (36.7 C)  TempSrc:      SpO2: 97%  92% 100%  Weight: 103.7 kg 103.7 kg    Height:        Intake/Output Summary (Last 24 hours) at 09/21/2020 1413 Last data filed at 09/21/2020 1345 Gross per 24 hour  Intake 1087.54 ml  Output 200 ml  Net 887.54 ml   Filed Weights   09/20/20 1821 09/20/20 2347 09/21/20 0348  Weight: 105.3 kg 103.7 kg 103.7 kg    Data Reviewed: I have personally reviewed and interpreted daily labs, tele strips, imagings as discussed above. I reviewed all nursing notes, pharmacy notes, vitals, pertinent old records I have discussed plan of care as described above with RN and patient/family.  CBC: Recent Labs  Lab 09/19/20 1547 09/20/20 0647 09/21/20 0558  WBC 10.3 7.0 7.3  HGB 13.5 13.7 12.7  HCT 39.1 39.5 35.6*  MCV 94.9 93.8 95.4  PLT 218 203 829   Basic Metabolic Panel: Recent Labs  Lab 09/19/20 1547 09/20/20 0647 09/21/20 0558  NA 139 140 139  K 4.1 3.8 3.8  CL 107 105 107  CO2 $Re'23 25 25  'GLj$ GLUCOSE 102* 106* 98  BUN 30* 25* 22  CREATININE 1.37* 0.93 1.18*  CALCIUM 9.0 9.1 8.7*  MG 2.2  --   2.1  PHOS  --   --  4.3    Studies: No results found.  Scheduled Meds:  apixaban  5 mg Oral BID   aspirin EC  81 mg Oral Daily   carvedilol  25 mg Oral BID WC   diltiazem  30 mg Oral Q6H   famotidine  20 mg Oral BID   mometasone-formoterol  2 puff Inhalation BID   rosuvastatin  20 mg Oral Daily   sodium chloride flush  3 mL Intravenous Q12H   Continuous Infusions:  amiodarone 30 mg/hr (09/21/20 0145)   PRN Meds: acetaminophen **OR** acetaminophen, albuterol, ondansetron **OR** ondansetron (ZOFRAN) IV, senna-docusate  Time spent: 35 minutes  Author: Val Riles. MD [facility] 09/21/2020 2:13 PM  To reach On-call, see care teams to locate the attending and reach out to them via [email or web address]. If 7PM-7AM, please contact night-coverage If you still have difficulty reaching the attending provider, please page the [facility] (Director on Call) for  [facility] on amion for assistance.

## 2020-09-21 NOTE — Progress Notes (Signed)
SUBJECTIVE: Audrey Wolf is a 70 y.o. female with medical history significant for CAD s/p CABG, chronic diastolic CHF (EF 62% April 2019), CKD stage IIIa, HTN, HLD, chronic left hydronephrosis who presented to the ED for evaluation of rapid heart rate, found to have A. fib with RVR.  Patient denies chest pain, shortness of breath, dizziness, and palpitations.    Vitals:   09/20/20 1821 09/20/20 1930 09/20/20 2347 09/21/20 0348  BP: (!) 154/79 103/67 (!) 138/107   Pulse: 97 (!) 51 81   Resp: 18     Temp: 98.3 F (36.8 C) 98 F (36.7 C) 97.9 F (36.6 C)   TempSrc:      SpO2: 100% 95% 97%   Weight: 105.3 kg  103.7 kg 103.7 kg  Height:        Intake/Output Summary (Last 24 hours) at 09/21/2020 0824 Last data filed at 09/21/2020 0300 Gross per 24 hour  Intake 836.84 ml  Output 200 ml  Net 636.84 ml    LABS: Basic Metabolic Panel: Recent Labs    09/19/20 1547 09/20/20 0647 09/21/20 0558  NA 139 140 139  K 4.1 3.8 3.8  CL 107 105 107  CO2 23 25 25   GLUCOSE 102* 106* 98  BUN 30* 25* 22  CREATININE 1.37* 0.93 1.18*  CALCIUM 9.0 9.1 8.7*  MG 2.2  --  2.1  PHOS  --   --  4.3   Liver Function Tests: No results for input(s): AST, ALT, ALKPHOS, BILITOT, PROT, ALBUMIN in the last 72 hours. No results for input(s): LIPASE, AMYLASE in the last 72 hours. CBC: Recent Labs    09/20/20 0647 09/21/20 0558  WBC 7.0 7.3  HGB 13.7 12.7  HCT 39.5 35.6*  MCV 93.8 95.4  PLT 203 200   Cardiac Enzymes: No results for input(s): CKTOTAL, CKMB, CKMBINDEX, TROPONINI in the last 72 hours. BNP: Invalid input(s): POCBNP D-Dimer: No results for input(s): DDIMER in the last 72 hours. Hemoglobin A1C: No results for input(s): HGBA1C in the last 72 hours. Fasting Lipid Panel: No results for input(s): CHOL, HDL, LDLCALC, TRIG, CHOLHDL, LDLDIRECT in the last 72 hours. Thyroid Function Tests: Recent Labs    09/19/20 1547  TSH 5.879*   Anemia Panel: No results for input(s):  VITAMINB12, FOLATE, FERRITIN, TIBC, IRON, RETICCTPCT in the last 72 hours.   PHYSICAL EXAM General: Well developed, well nourished, in no acute distress HEENT:  Normocephalic and atramatic Neck:  No JVD.  Lungs: Clear bilaterally to auscultation and percussion. Heart: Atrial fibrillation, Normal S1 and S2 without gallops or murmurs.  Abdomen: Bowel sounds are positive, abdomen soft and non-tender  Msk:  Back normal, normal gait. Normal strength and tone for age. Extremities: No clubbing, cyanosis or edema.   Neuro: Alert and oriented X 3. Psych:  Good affect, responds appropriately  TELEMETRY: atrial fibrillation, HR 84  ASSESSMENT AND PLAN: Patient continues to be in atrial fibrillation, rate controlled. Blood pressure controlled. Continue IV amiodarone, diltiazem for rate and rhythm control.  Principal Problem:   Atrial fibrillation with RVR (HCC) Active Problems:   Hypertension   Hyperlipidemia   CAD (coronary artery disease), native coronary artery   Chronic diastolic CHF (congestive heart failure) (HCC)   Acute kidney injury superimposed on CKD (HCC)    Makenzie Vittorio, FNP-C 09/21/2020 8:24 AM

## 2020-09-22 ENCOUNTER — Inpatient Hospital Stay
Admit: 2020-09-22 | Discharge: 2020-09-22 | Disposition: A | Discharge status: Home / Self Care | Payer: BC Managed Care – PPO | Attending: Cardiovascular Disease | Admitting: Cardiovascular Disease

## 2020-09-22 LAB — CBC
HCT: 36.6 % (ref 36.0–46.0)
Hemoglobin: 12.9 g/dL (ref 12.0–15.0)
MCH: 33.5 pg (ref 26.0–34.0)
MCHC: 35.2 g/dL (ref 30.0–36.0)
MCV: 95.1 fL (ref 80.0–100.0)
Platelets: 186 10*3/uL (ref 150–400)
RBC: 3.85 MIL/uL — ABNORMAL LOW (ref 3.87–5.11)
RDW: 12.8 % (ref 11.5–15.5)
WBC: 7.6 10*3/uL (ref 4.0–10.5)
nRBC: 0 % (ref 0.0–0.2)

## 2020-09-22 LAB — BASIC METABOLIC PANEL
Anion gap: 8 (ref 5–15)
BUN: 25 mg/dL — ABNORMAL HIGH (ref 8–23)
CO2: 24 mmol/L (ref 22–32)
Calcium: 9 mg/dL (ref 8.9–10.3)
Chloride: 106 mmol/L (ref 98–111)
Creatinine, Ser: 1.3 mg/dL — ABNORMAL HIGH (ref 0.44–1.00)
GFR, Estimated: 44 mL/min — ABNORMAL LOW (ref >60)
Glucose, Bld: 97 mg/dL (ref 70–99)
Potassium: 3.9 mmol/L (ref 3.5–5.1)
Sodium: 138 mmol/L (ref 135–145)

## 2020-09-22 LAB — MAGNESIUM: Magnesium: 2 mg/dL (ref 1.7–2.4)

## 2020-09-22 LAB — PHOSPHORUS: Phosphorus: 4.2 mg/dL (ref 2.5–4.6)

## 2020-09-22 MED ORDER — DILTIAZEM HCL 30 MG PO TABS: 30.0000 mg | ORAL_TABLET | Freq: Four times a day (QID) | ORAL | 2 refills | Status: DC

## 2020-09-22 MED ORDER — AMIODARONE HCL 200 MG PO TABS
400.0000 mg | ORAL_TABLET | Freq: Two times a day (BID) | ORAL | Status: DC
  Administered 2020-09-22: 400 mg via ORAL
  Filled 2020-09-22: qty 2

## 2020-09-22 MED ORDER — AMIODARONE HCL 400 MG PO TABS: 400.0000 mg | ORAL_TABLET | Freq: Two times a day (BID) | ORAL | 0 refills | Status: DC

## 2020-09-22 MED ORDER — APIXABAN 5 MG PO TABS: 5.0000 mg | ORAL_TABLET | Freq: Two times a day (BID) | ORAL | 2 refills | Status: DC

## 2020-09-22 MED ORDER — ASPIRIN EC 81 MG PO TBEC: 81.0000 mg | DELAYED_RELEASE_TABLET | Freq: Every day | ORAL | 2 refills | Status: AC

## 2020-09-22 NOTE — Plan of Care (Signed)
  Problem: Education: Goal: Knowledge of General Education information will improve Description: Including pain rating scale, medication(s)/side effects and non-pharmacologic comfort measures 09/22/2020 1034 by Ansel Bong, RN Outcome: Progressing 09/22/2020 1034 by Ansel Bong, RN Outcome: Progressing   Problem: Health Behavior/Discharge Planning: Goal: Ability to manage health-related needs will improve 09/22/2020 1034 by Ansel Bong, RN Outcome: Progressing 09/22/2020 1034 by Ansel Bong, RN Outcome: Progressing   Problem: Clinical Measurements: Goal: Diagnostic test results will improve 09/22/2020 1034 by Ansel Bong, RN Outcome: Progressing 09/22/2020 1034 by Ansel Bong, RN Outcome: Progressing Goal: Cardiovascular complication will be avoided 09/22/2020 1034 by Ansel Bong, RN Outcome: Progressing 09/22/2020 1034 by Ansel Bong, RN Outcome: Progressing   Problem: Safety: Goal: Ability to remain free from injury will improve 09/22/2020 1034 by Ansel Bong, RN Outcome: Progressing 09/22/2020 1034 by Ansel Bong, RN Outcome: Progressing

## 2020-09-22 NOTE — Progress Notes (Signed)
Atrial fibrillation rate well controlled, switched to po amiodrone. May go home with f/u office this Tuesday 1 pm.

## 2020-09-22 NOTE — Progress Notes (Signed)
This RN provided discharge instructions and teaching to the patient. The patient verbalized and demonstrated understanding of the provided instructions. All outstanding questions resolved. Patient to f/u with cardiology outpatient for Echocardiogram results per Dr. Lucianne Muss. R and L arm PIVs removed. Both cannulas intact. Pt tolerated well. All belongings packed and in tow. Volunteer services to transport patient to private vehicle via wheelchair at time of departure.

## 2020-09-22 NOTE — Discharge Summary (Signed)
[facility] Discharge Summary   Patient: Audrey Wolf AST:[id number]  PCP: Leone Haven, MD  Date of admission: 09/19/2020   Date of discharge:  09/22/2020     Discharge Diagnoses:  Principal Problem:   Atrial fibrillation with RVR ([place]) Active Problems:   Hypertension   Hyperlipidemia   CAD (coronary artery disease), native coronary artery   Chronic diastolic CHF (congestive heart failure) ([place])   Acute kidney injury superimposed on CKD ([place])   Admitted From: Home Disposition:  Home   Recommendations for Outpatient Follow-up:  PCP: in 1 wk Cardiologist in 1 wk Follow up LABS/TEST:  repeat BMP after one week to check creatinine, f/u TTE report with cardiology as out pt Check LFTS/TSH as pt is on Amiodarone    Diet recommendation: Cardiac diet  Activity: The patient is advised to gradually reintroduce usual activities, as tolerated  Discharge Condition: stable  Code Status: Full code   History of present illness: As per the H and P dictated on admission Hospital Course:  Audrey Wolf is a 70 y.o. female with medical history significant for CAD s/p CABG, chronic diastolic CHF (EF 98% April 2019), CKD stage IIIa, HTN, HLD, chronic left hydronephrosis who presented to the ED for evaluation of rapid heart rate, found to have A. fib with RVR. ED Course:  Initial vitals showed BP 146/87, pulse 120, RR 18, temp 98.3 F, SPO2 100% on room air. Labs show sodium 139, potassium 4.1, bicarb 23, BUN 30, creatinine 1.37, EGFR 42, serum glucose 102, WBC 10.3, hemoglobin 13.5, platelets 218,000, BNP 385.9, high-sensitivity troponin 4. SARS-CoV-2 PCR panel was negative.  Influenza is negative. 2 view chest x-ray shows prior sternotomy changes, stable calcified nodule in the peripheral left upper lobe.  No focal consolidation, edema, or effusion. EKG showed atrial fibrillation with RVR, rate 140. Patient was started on IV diltiazem infusion and the hospitalist service  was consulted to admit for further evaluation and management. Assessment and Plan: Principal Problem:   Atrial fibrillation with RVR ([place]) Active Problems:   Hypertension   Hyperlipidemia   CAD (coronary artery disease), native coronary artery   Chronic diastolic CHF (congestive heart failure) ([facility])   Acute kidney injury superimposed on CKD ([place])   Audrey Wolf is a 70 y.o. female with medical history significant for CAD s/p CABG, chronic diastolic CHF (EF 92% April 2019), CKD stage IIIa, HTN, HLD, chronic left hydronephrosis who is admitted with new A. fib with RVR. # Atrial fibrillation with RVR:New diagnosis.  CHA2DS2-VASc score is at least 5.  Discussed increased stroke risk and patient is agreeable to start oral anticoagulation.  She has been started on IV diltiazem infusion.-s/p IV diltiazem infusion, added Cardizem 30 mg p.o. every 6 hourly, Resumed home Coreg 25 mg BID. Started Eliquis 5 mg twice daily, risk and benefit discussed with patient Cardiology consulted, patient was started on amiodarone infusion and transition to amiodarone 400 mg p.o. twice daily, TTE pending.  Patient was cleared by cardiology to discharge and follow-up as an outpatient. # AKI on CKD stage IIIa: Likely prerenal from hypoperfusion in setting of A. fib with RVR.  Has known chronic severe left hydronephrosis with likely poorly functioning left kidney.  Creatinine slightly elevated, patient was on losartan which was held during hospital stay.  Patient was advised to follow with PCP and repeat BMP after 1 week to check kidney functions # CAD s/p CABG: Denies any chest pain.  Reduce home aspirin from 325 mg to 81  mg daily now that we are starting Eliquis.  Continue Coreg and Crestor. # Chronic diastolic CHF: No evidence of acute decompensation.  repeat TTE pending f/u cardio # Hypertension:Currently controlled.  On IV diltiazem as above.  Continue home Coreg.  d/c'd  losartan, hydralazine, amlodipine.  #  Asthma: Continue Symbicort. # Hyperlipidemia: Continue rosuvastatin. # Elevated TSH level, recommend to follow with PCP as an outpatient for thyroid function test, repeat in 4 weeks.  Free T4 level 0.89 within normal range Body mass index is 33.57 kg/m.  Nutrition Interventions:   - Patient was instructed, not to drive, operate heavy machinery, perform activities at heights, swimming or participation in water activities or provide baby sitting services while on Pain, Sleep and Anxiety Medications; until her outpatient Physician has advised to do so again.  - Also recommended to not to take more than prescribed Pain, Sleep and Anxiety Medications.  Patient was ambulatory without any assistance. On the day of the discharge the patient's vitals were stable, and no other acute medical condition were reported by patient. the patient was felt safe to be discharge at Home .  Consultants: Cardiology Procedures: None  Discharge Exam: General: Appear in no distress, no Rash; Oral Mucosa Clear, moist. Cardiovascular: S1 and S2 Present, no Murmur, Respiratory: normal respiratory effort, Bilateral Air entry present and no Crackles, no wheezes Abdomen: Bowel Sound present, Soft and no tenderness, no hernia Extremities: no Pedal edema, no calf tenderness Neurology: alert and oriented to time, place, and person affect appropriate.  Filed Weights   09/20/20 2347 09/21/20 0348 09/22/20 0500  Weight: 103.7 kg 103.7 kg 103.1 kg   Vitals:   09/22/20 0430 09/22/20 0717  BP: 118/64 114/65  Pulse: 93 71  Resp: 17 16  Temp: 98.2 F (36.8 C) 98 F (36.7 C)  SpO2: 100% 100%    DISCHARGE MEDICATION: Allergies as of 09/22/2020       Reactions   Lisinopril Cough   Ace Inhibitors Other (See Comments)   Has been told to avoid these and any diuretics because "left kidney has shrunk up and doesn't work"        Medication List     STOP taking these medications    amLODipine 10 MG tablet Commonly  known as: NORVASC   hydrALAZINE 25 MG tablet Commonly known as: APRESOLINE   losartan 100 MG tablet Commonly known as: COZAAR       TAKE these medications    acetaminophen 500 MG tablet Commonly known as: TYLENOL Take 1 tablet (500 mg total) by mouth daily as needed for mild pain or headache.   amiodarone 400 MG tablet Commonly known as: PACERONE Take 1 tablet (400 mg total) by mouth 2 (two) times daily.   apixaban 5 MG Tabs tablet Commonly known as: ELIQUIS Take 1 tablet (5 mg total) by mouth 2 (two) times daily.   aspirin EC 81 MG tablet Take 1 tablet (81 mg total) by mouth daily. Swallow whole. What changed:  medication strength how much to take additional instructions   Baclofen 5 MG Tabs Take 5 mg by mouth 3 (three) times daily as needed for muscle spasms.   budesonide-formoterol 80-4.5 MCG/ACT inhaler Commonly known as: SYMBICORT Inhale 2 puffs into the lungs 2 (two) times daily.   carvedilol 25 MG tablet Commonly known as: COREG Take 25 mg by mouth 2 (two) times daily with a meal.   diltiazem 30 MG tablet Commonly known as: CARDIZEM Take 1 tablet (30 mg total) by mouth  every 6 (six) hours.   famotidine 20 MG tablet Commonly known as: PEPCID Take 20 mg by mouth 2 (two) times daily.   fluticasone 50 MCG/ACT nasal spray Commonly known as: FLONASE Place 2 sprays into both nostrils daily as needed for allergies or rhinitis.   loratadine 10 MG tablet Commonly known as: CLARITIN Take 10 mg by mouth daily.   oxybutynin 10 MG 24 hr tablet Commonly known as: DITROPAN-XL Take 10 mg by mouth at bedtime.   PreserVision AREDS 2 Caps Take 2 capsules by mouth daily.   rosuvastatin 20 MG tablet Commonly known as: CRESTOR Take 1 tablet by mouth once daily       Allergies  Allergen Reactions   Lisinopril Cough   Ace Inhibitors Other (See Comments)    Has been told to avoid these and any diuretics because "left kidney has shrunk up and doesn't work"    Discharge Instructions     Call MD for:  extreme fatigue   Complete by: As directed    Call MD for:  persistant dizziness or light-headedness   Complete by: As directed    Call MD for:  persistant nausea and vomiting   Complete by: As directed    Call MD for:  severe uncontrolled pain   Complete by: As directed    Call MD for:  temperature >100.4   Complete by: As directed    Diet - low sodium heart healthy   Complete by: As directed    Discharge instructions   Complete by: As directed    Follow with PCP in 1 week, continue to monitor blood pressure and heart rate at home and follow with PCP and cardiology Follow with cardiology in 1 week, echocardiogram report is pending, eval LVEF and titrate medication accordingly.  Amiodarone dose needs to be decreased as an outpatient and if low EF can patient may need ACE inhibitor/ARB's depends on blood pressure.   Increase activity slowly   Complete by: As directed        The results of significant diagnostics from this hospitalization (including imaging, microbiology, ancillary and laboratory) are listed below for reference.    Significant Diagnostic Studies: DG Chest 2 View  Result Date: 09/19/2020 CLINICAL DATA:  New onset AFib EXAM: CHEST - 2 VIEW COMPARISON:  08/27/2017 FINDINGS: Post sternotomy changes. No focal opacity or pleural effusion. Normal cardiomediastinal silhouette with aortic atherosclerosis. Stable calcified nodule in the peripheral left upper lobe. IMPRESSION: No active cardiopulmonary disease. Electronically Signed   By: Donavan Foil M.D.   On: 09/19/2020 16:28   DG Thoracic Spine W/Swimmers  Result Date: 09/02/2020 CLINICAL DATA:  Midthoracic back pain, left paraspinal tenderness for 1 month EXAM: THORACIC SPINE - 3 VIEWS COMPARISON:  11/24/2017 FINDINGS: Frontal and lateral views of the thoracic spine are obtained. There is gentle S-shaped scoliosis, right convex at the thoracolumbar junction. Otherwise alignment  is anatomic. No acute displaced fractures. There is mild midthoracic spondylosis. Paraspinal soft tissues are unremarkable. Visualized portions of the lungs are clear. IMPRESSION: 1. Gentle S-shaped thoracic scoliosis, with mild midthoracic spondylosis. No acute bony abnormality. Electronically Signed   By: Randa Ngo M.D.   On: 09/02/2020 08:51    Microbiology: Recent Results (from the past 240 hour(s))  Resp Panel by RT-PCR (Flu A&B, Covid) Nasopharyngeal Swab     Status: None   Collection Time: 09/19/20  4:51 PM   Specimen: Nasopharyngeal Swab; Nasopharyngeal(NP) swabs in vial transport medium  Result Value Ref Range Status  SARS Coronavirus 2 by RT PCR NEGATIVE NEGATIVE Final    Comment: (NOTE) SARS-CoV-2 target nucleic acids are NOT DETECTED.  The SARS-CoV-2 RNA is generally detectable in upper respiratory specimens during the acute phase of infection. The lowest concentration of SARS-CoV-2 viral copies this assay can detect is 138 copies/mL. A negative result does not preclude SARS-Cov-2 infection and should not be used as the sole basis for treatment or other patient management decisions. A negative result may occur with  improper specimen collection/handling, submission of specimen other than nasopharyngeal swab, presence of viral mutation(s) within the areas targeted by this assay, and inadequate number of viral copies(<138 copies/mL). A negative result must be combined with clinical observations, patient history, and epidemiological information. The expected result is Negative.  Fact Sheet for Patients:  EntrepreneurPulse.com.au  Fact Sheet for Healthcare Providers:  IncredibleEmployment.be  This test is no t yet approved or cleared by the [place] FDA and  has been authorized for detection and/or diagnosis of SARS-CoV-2 by FDA under an Emergency Use Authorization (EUA). This EUA will remain  in effect (meaning this test can be  used) for the duration of the COVID-19 declaration under Section 564(b)(1) of the Act, 21 U.S.C.section 360bbb-3(b)(1), unless the authorization is terminated  or revoked sooner.       Influenza A by PCR NEGATIVE NEGATIVE Final   Influenza B by PCR NEGATIVE NEGATIVE Final    Comment: (NOTE) The Xpert Xpress SARS-CoV-2/FLU/RSV plus assay is intended as an aid in the diagnosis of influenza from Nasopharyngeal swab specimens and should not be used as a sole basis for treatment. Nasal washings and aspirates are unacceptable for Xpert Xpress SARS-CoV-2/FLU/RSV testing.  Fact Sheet for Patients: EntrepreneurPulse.com.au  Fact Sheet for Healthcare Providers: IncredibleEmployment.be  This test is not yet approved or cleared by the [place] FDA and has been authorized for detection and/or diagnosis of SARS-CoV-2 by FDA under an Emergency Use Authorization (EUA). This EUA will remain in effect (meaning this test can be used) for the duration of the COVID-19 declaration under Section 564(b)(1) of the Act, 21 U.S.C. section 360bbb-3(b)(1), unless the authorization is terminated or revoked.  Performed at [facility], [address] [place], [place] [address]      Labs: CBC: Recent Labs  Lab 09/19/20 1547 09/20/20 0647 09/21/20 0558 09/22/20 0528  WBC 10.3 7.0 7.3 7.6  HGB 13.5 13.7 12.7 12.9  HCT 39.1 39.5 35.6* 36.6  MCV 94.9 93.8 95.4 95.1  PLT 218 203 200 559   Basic Metabolic Panel: Recent Labs  Lab 09/19/20 1547 09/20/20 0647 09/21/20 0558 09/22/20 0528  NA 139 140 139 138  K 4.1 3.8 3.8 3.9  CL 107 105 107 106  CO2 _0 GLUCOSE 102* 106* 98 97  BUN 30* 25* 22 25*  CREATININE 1.37* 0.93 1.18* 1.30*  CALCIUM 9.0 9.1 8.7* 9.0  MG 2.2  --  2.1 2.0  PHOS  --   --  4.3 4.2   Liver Function Tests: No results for input(s): AST, ALT, ALKPHOS, BILITOT, PROT, ALBUMIN in the last 168 hours. No results for  input(s): LIPASE, AMYLASE in the last 168 hours. No results for input(s): AMMONIA in the last 168 hours. Cardiac Enzymes: No results for input(s): CKTOTAL, CKMB, CKMBINDEX, TROPONINI in the last 168 hours. BNP (last 3 results) Recent Labs    09/19/20 1651  BNP 385.9*   CBG: No results for input(s): GLUCAP in the last 168 hours.  Time spent:  35 minutes  Signed:  Val Riles  [facility]  09/22/2020 12:09 PM

## 2020-09-22 NOTE — Progress Notes (Signed)
*  PRELIMINARY RESULTS* Echocardiogram 2D Echocardiogram has been performed.  Lenor Coffin 09/22/2020, 12:01 PM

## 2020-09-25 DIAGNOSIS — I25810 Atherosclerosis of coronary artery bypass graft(s) without angina pectoris: Secondary | ICD-10-CM | POA: Diagnosis not present

## 2020-09-25 DIAGNOSIS — I10 Essential (primary) hypertension: Secondary | ICD-10-CM | POA: Diagnosis not present

## 2020-09-25 DIAGNOSIS — R0602 Shortness of breath: Secondary | ICD-10-CM | POA: Diagnosis not present

## 2020-09-25 DIAGNOSIS — I4891 Unspecified atrial fibrillation: Secondary | ICD-10-CM | POA: Diagnosis not present

## 2020-09-25 DIAGNOSIS — E782 Mixed hyperlipidemia: Secondary | ICD-10-CM | POA: Diagnosis not present

## 2020-09-25 DIAGNOSIS — I2510 Atherosclerotic heart disease of native coronary artery without angina pectoris: Secondary | ICD-10-CM | POA: Diagnosis not present

## 2020-09-25 DIAGNOSIS — R079 Chest pain, unspecified: Secondary | ICD-10-CM | POA: Diagnosis not present

## 2020-09-25 LAB — ECHOCARDIOGRAM COMPLETE
AR max vel: 1.58 cm2
AV Peak grad: 8.4 mmHg
Ao pk vel: 1.45 m/s
Area-P 1/2: 3.08 cm2
Height: 69 in
S' Lateral: 3.2 cm
Weight: 3636.8 oz

## 2020-10-02 ENCOUNTER — Ambulatory Visit (INDEPENDENT_AMBULATORY_CARE_PROVIDER_SITE_OTHER): Payer: BC Managed Care – PPO

## 2020-10-02 ENCOUNTER — Other Ambulatory Visit: Payer: Self-pay

## 2020-10-02 VITALS — BP 143/76 | HR 60 | Temp 97.6°F | Resp 17 | Ht 69.0 in | Wt 234.0 lb

## 2020-10-02 DIAGNOSIS — Z0000 Encounter for general adult medical examination without abnormal findings: Secondary | ICD-10-CM | POA: Diagnosis not present

## 2020-10-02 NOTE — Patient Instructions (Addendum)
[name] , Thank you for taking time to come for your Medicare Wellness Visit. I appreciate your ongoing commitment to your health goals. Please review the following plan we discussed and let me know if I can assist you in the future.   These are the goals we discussed:  Goals      Advanced Directive     Discuss with doctor as needed        This is a list of the screening recommended for you and due dates:  Health Maintenance  Topic Date Due   Mammogram  12/24/2019   Flu Shot  05/03/2021*   Colon Cancer Screening  02/03/2022   Tetanus Vaccine  02/04/2023   DEXA scan (bone density measurement)  Completed   Hepatitis C Screening: USPSTF Recommendation to screen - Ages 63-79 yo.  Completed   Pneumonia vaccines  Completed   HPV Vaccine  Aged Out   COVID-19 Vaccine  Discontinued   Zoster (Shingles) Vaccine  Discontinued  *Topic was postponed. The date shown is not the original due date.    Advanced directives: End of life planning; Advance aging; Advanced directives discussed.  Copy of current HCPOA/Living Will requested.    Follow up in one year for your annual wellness visit    Preventive Care 65 Years and Older, Female Preventive care refers to lifestyle choices and visits with your health care provider that can promote health and wellness. What does preventive care include? A yearly physical exam. This is also called an annual well check. Dental exams once or twice a year. Routine eye exams. Ask your health care provider how often you should have your eyes checked. Personal lifestyle choices, including: Daily care of your teeth and gums. Regular physical activity. Eating a healthy diet. Avoiding tobacco and drug use. Limiting alcohol use. Practicing safe sex. Taking low-dose aspirin every day. Taking vitamin and mineral supplements as recommended by your health care provider. What happens during an annual well check? The services and screenings done by your health  care provider during your annual well check will depend on your age, overall health, lifestyle risk factors, and family history of disease. Counseling  Your health care provider may ask you questions about your: Alcohol use. Tobacco use. Drug use. Emotional well-being. Home and relationship well-being. Sexual activity. Eating habits. History of falls. Memory and ability to understand (cognition). Work and work Astronomer. Reproductive health. Screening  You may have the following tests or measurements: Height, weight, and BMI. Blood pressure. Lipid and cholesterol levels. These may be checked every 5 years, or more frequently if you are over 85 years old. Skin check. Lung cancer screening. You may have this screening every year starting at age 59 if you have a 30-pack-year history of smoking and currently smoke or have quit within the past 15 years. Fecal occult blood test (FOBT) of the stool. You may have this test every year starting at age 31. Flexible sigmoidoscopy or colonoscopy. You may have a sigmoidoscopy every 5 years or a colonoscopy every 10 years starting at age 22. Hepatitis C blood test. Hepatitis B blood test. Sexually transmitted disease (STD) testing. Diabetes screening. This is done by checking your blood sugar (glucose) after you have not eaten for a while (fasting). You may have this done every 1-3 years. Bone density scan. This is done to screen for osteoporosis. You may have this done starting at age 67. Mammogram. This may be done every 1-2 years. Talk to your health care provider  about how often you should have regular mammograms. Talk with your health care provider about your test results, treatment options, and if necessary, the need for more tests. Vaccines  Your health care provider may recommend certain vaccines, such as: Influenza vaccine. This is recommended every year. Tetanus, diphtheria, and acellular pertussis (Tdap, Td) vaccine. You may need a Td  booster every 10 years. Zoster vaccine. You may need this after age 30. Pneumococcal 13-valent conjugate (PCV13) vaccine. One dose is recommended after age 53. Pneumococcal polysaccharide (PPSV23) vaccine. One dose is recommended after age 103. Talk to your health care provider about which screenings and vaccines you need and how often you need them. This information is not intended to replace advice given to you by your health care provider. Make sure you discuss any questions you have with your health care provider. Document Released: 02/16/2015 Document Revised: 10/10/2015 Document Reviewed: 11/21/2014 Elsevier Interactive Patient Education  2017 [place] Prevention in the Home Falls can cause injuries. They can happen to people of all ages. There are many things you can do to make your home safe and to help prevent falls. What can I do on the outside of my home? Regularly fix the edges of walkways and driveways and fix any cracks. Remove anything that might make you trip as you walk through a door, such as a raised step or threshold. Trim any bushes or trees on the path to your home. Use bright outdoor lighting. Clear any walking paths of anything that might make someone trip, such as rocks or tools. Regularly check to see if handrails are loose or broken. Make sure that both sides of any steps have handrails. Any raised decks and porches should have guardrails on the edges. Have any leaves, snow, or ice cleared regularly. Use sand or salt on walking paths during winter. Clean up any spills in your garage right away. This includes oil or grease spills. What can I do in the bathroom? Use night lights. Install grab bars by the toilet and in the tub and shower. Do not use towel bars as grab bars. Use non-skid mats or decals in the tub or shower. If you need to sit down in the shower, use a plastic, non-slip stool. Keep the floor dry. Clean up any water that spills on the floor  as soon as it happens. Remove soap buildup in the tub or shower regularly. Attach bath mats securely with double-sided non-slip rug tape. Do not have throw rugs and other things on the floor that can make you trip. What can I do in the bedroom? Use night lights. Make sure that you have a light by your bed that is easy to reach. Do not use any sheets or blankets that are too big for your bed. They should not hang down onto the floor. Have a firm chair that has side arms. You can use this for support while you get dressed. Do not have throw rugs and other things on the floor that can make you trip. What can I do in the kitchen? Clean up any spills right away. Avoid walking on wet floors. Keep items that you use a lot in easy-to-reach places. If you need to reach something above you, use a strong step stool that has a grab bar. Keep electrical cords out of the way. Do not use floor polish or wax that makes floors slippery. If you must use wax, use non-skid floor wax. Do not have throw rugs and  other things on the floor that can make you trip. What can I do with my stairs? Do not leave any items on the stairs. Make sure that there are handrails on both sides of the stairs and use them. Fix handrails that are broken or loose. Make sure that handrails are as long as the stairways. Check any carpeting to make sure that it is firmly attached to the stairs. Fix any carpet that is loose or worn. Avoid having throw rugs at the top or bottom of the stairs. If you do have throw rugs, attach them to the floor with carpet tape. Make sure that you have a light switch at the top of the stairs and the bottom of the stairs. If you do not have them, ask someone to add them for you. What else can I do to help prevent falls? Wear shoes that: Do not have high heels. Have rubber bottoms. Are comfortable and fit you well. Are closed at the toe. Do not wear sandals. If you use a stepladder: Make sure that it is  fully opened. Do not climb a closed stepladder. Make sure that both sides of the stepladder are locked into place. Ask someone to hold it for you, if possible. Clearly mark and make sure that you can see: Any grab bars or handrails. First and last steps. Where the edge of each step is. Use tools that help you move around (mobility aids) if they are needed. These include: Canes. Walkers. Scooters. Crutches. Turn on the lights when you go into a dark area. Replace any light bulbs as soon as they burn out. Set up your furniture so you have a clear path. Avoid moving your furniture around. If any of your floors are uneven, fix them. If there are any pets around you, be aware of where they are. Review your medicines with your doctor. Some medicines can make you feel dizzy. This can increase your chance of falling. Ask your doctor what other things that you can do to help prevent falls. This information is not intended to replace advice given to you by your health care provider. Make sure you discuss any questions you have with your health care provider. Document Released: 11/16/2008 Document Revised: 06/28/2015 Document Reviewed: 02/24/2014 Elsevier Interactive Patient Education  2017 [employer].

## 2020-10-02 NOTE — Progress Notes (Signed)
Subjective:   Audrey Wolf is a 70 y.o. female who presents for Medicare Annual (Subsequent) preventive examination.  Review of Systems    No ROS.  Medicare Wellness   Cardiac Risk Factors include: advanced age (>74men, >29 women)     Objective:    Today's Vitals   10/02/20 0914  Pulse: 60  Resp: 17  Temp: 97.6 F (36.4 C)  SpO2: 97%  Weight: 234 lb (106.1 kg)  Height: 5\' 9"  (1.753 m)   Body mass index is 34.56 kg/m.  Advanced Directives 10/02/2020 09/20/2020 09/19/2020 05/19/2017 05/18/2017 05/16/2017  Does Patient Have a Medical Advance Directive? No - No No No No  Does patient want to make changes to medical advance directive? - - - No - Patient declined - -  Would patient like information on creating a medical advance directive? Yes (MAU/Ambulatory/Procedural Areas - Information given) No - Guardian declined - No - Patient declined No - Patient declined Yes (Inpatient - patient requests chaplain consult to create a medical advance directive)    Current Medications (verified) Outpatient Encounter Medications as of 10/02/2020  Medication Sig   acetaminophen (TYLENOL) 500 MG tablet Take 1 tablet (500 mg total) by mouth daily as needed for mild pain or headache.   amiodarone (PACERONE) 400 MG tablet Take 1 tablet (400 mg total) by mouth 2 (two) times daily.   apixaban (ELIQUIS) 5 MG TABS tablet Take 1 tablet (5 mg total) by mouth 2 (two) times daily.   aspirin EC 81 MG tablet Take 1 tablet (81 mg total) by mouth daily. Swallow whole.   carvedilol (COREG) 25 MG tablet Take 25 mg by mouth 2 (two) times daily with a meal.   diltiazem (CARDIZEM) 30 MG tablet Take 1 tablet (30 mg total) by mouth every 6 (six) hours.   famotidine (PEPCID) 20 MG tablet Take 20 mg by mouth 2 (two) times daily.   loratadine (CLARITIN) 10 MG tablet Take 10 mg by mouth daily.    losartan (COZAAR) 100 MG tablet Take 100 mg by mouth daily.   Multiple Vitamins-Minerals (PRESERVISION AREDS 2) CAPS Take 2  capsules by mouth daily.   oxybutynin (DITROPAN-XL) 10 MG 24 hr tablet Take 10 mg by mouth at bedtime.   rosuvastatin (CRESTOR) 20 MG tablet Take 1 tablet by mouth once daily   baclofen 5 MG TABS Take 5 mg by mouth 3 (three) times daily as needed for muscle spasms. (Patient not taking: Reported on 10/02/2020)   budesonide-formoterol (SYMBICORT) 80-4.5 MCG/ACT inhaler Inhale 2 puffs into the lungs 2 (two) times daily. (Patient not taking: Reported on 10/02/2020)   fluticasone (FLONASE) 50 MCG/ACT nasal spray Place 2 sprays into both nostrils daily as needed for allergies or rhinitis. (Patient not taking: Reported on 10/02/2020)   No facility-administered encounter medications on file as of 10/02/2020.    Allergies (verified) Lisinopril and Ace inhibitors   History: Past Medical History:  Diagnosis Date   Arthritis    Asthma    CHF (congestive heart failure) (HCC)    Chronic kidney disease    Coronary artery disease    GERD (gastroesophageal reflux disease)    Headache    Hypertension    Nephrolithiasis    NSTEMI (non-ST elevated myocardial infarction) (HCC)    Postprandial RUQ pain 05/25/2019   Past Surgical History:  Procedure Laterality Date   ABDOMINAL HYSTERECTOMY     APPENDECTOMY     BREAST BIOPSY Left 12/31/2010   neg   CORONARY ARTERY BYPASS  GRAFT N/A 05/22/2017   Procedure: CORONARY ARTERY BYPASS GRAFTING (CABG) x 2 WITH ENDOSCOPIC HARVESTING OF RIGHT SAPHENOUS VEIN;  Surgeon: Delight OvensGerhardt, Edward B, MD;  [facility];  Service: Open Heart Surgery;  Laterality: N/A;   LEFT HEART CATH AND CORONARY ANGIOGRAPHY N/A 05/18/2017   Procedure: LEFT HEART CATH AND CORONARY ANGIOGRAPHY;  Surgeon: Laurier NancyKhan, Shaukat A, MD;  Location: [facility] INVASIVE CV LAB;  Service: Cardiovascular;  Laterality: N/A;   LITHOTRIPSY     TEE WITHOUT CARDIOVERSION N/A 05/22/2017   Procedure: TRANSESOPHAGEAL ECHOCARDIOGRAM (TEE);  Surgeon: Delight OvensGerhardt, Edward B, MD;  [facility];  Service: Open Heart Surgery;   Laterality: N/A;   TONSILLECTOMY     Family History  Problem Relation Age of Onset   Arthritis Mother    Arthritis Father    Heart disease Father    Stroke Father    Sudden Cardiac Death Father    Stroke Son    Breast cancer Maternal Aunt    Social History   Socioeconomic History   Marital status: Single    Spouse name: Not on file   Number of children: Not on file   Years of education: Not on file   Highest education level: Not on file  Occupational History    Employer: [employer]  Tobacco Use   Smoking status: Never   Smokeless tobacco: Never  Vaping Use   Vaping Use: Never used  Substance and Sexual Activity   Alcohol use: No   Drug use: No   Sexual activity: Not Currently    Partners: Male    Birth control/protection: Post-menopausal  Other Topics Concern   Not on file  Social History Narrative   Not on file   Social Determinants of Health   Financial Resource Strain: Low Risk    Difficulty of Paying Living Expenses: Not hard at all  Food Insecurity: No Food Insecurity   Worried About Programme researcher, broadcasting/film/videounning Out of Food in the Last Year: Never true   Ran Out of Food in the Last Year: Never true  Transportation Needs: No Transportation Needs   Lack of Transportation (Medical): No   Lack of Transportation (Non-Medical): No  Physical Activity: Not on file  Stress: No Stress Concern Present   Feeling of Stress : Only a little  Social Connections: Unknown   Frequency of Communication with Friends and Family: More than three times a week   Frequency of Social Gatherings with Friends and Family: Not on file   Attends Religious Services: Not on Scientist, clinical (histocompatibility and immunogenetics)file   Active Member of Clubs or Organizations: Not on file   Attends BankerClub or Organization Meetings: Not on file   Marital Status: Not on file    Tobacco Counseling Counseling given: Not Answered   Clinical Intake:  Pre-visit preparation completed: Yes        Diabetes: No  How often do you need to have someone help you when  you read instructions, pamphlets, or other written materials from your doctor or pharmacy?: 1 - Never  Interpreter Needed?: No      Activities of Daily Living In your present state of health, do you have any difficulty performing the following activities: 10/02/2020 09/20/2020  Hearing? N N  Vision? N N  Difficulty concentrating or making decisions? N N  Walking or climbing stairs? N N  Dressing or bathing? N N  Doing errands, shopping? N N  Preparing Food and eating ? N -  Using the Toilet? N -  In the past six months,  have you accidently leaked urine? N -  Do you have problems with loss of bowel control? N -  Managing your Medications? N -  Managing your Finances? N -  Housekeeping or managing your Housekeeping? N -  Some recent data might be hidden    Patient Care Team: Glori Luis, MD as PCP - General (Family Medicine)  Indicate any recent Medical Services you may have received from other than [facility] providers in the past year (date may be approximate).     Assessment:   This is a routine wellness examination for [facility].   Hearing/Vision screen Hearing Screening - Comments:: Patient is able to hear conversational tones without difficulty.  No issues reported.  Vision Screening - Comments:: Wears corrective lenses They have seen their ophthalmologist in the last 12 months.    Dietary issues and exercise activities discussed: Current Exercise Habits: Home exercise routine, Intensity: Mild   Goals Addressed             This Visit's Progress    Advanced Directive       Discuss with doctor as needed       Depression Screen PHQ 2/9 Scores 10/02/2020 08/30/2020 06/01/2020 11/29/2019 08/16/2019 05/25/2019 05/17/2019  PHQ - 2 Score 0 0 0 0 0 0 0  PHQ- 9 Score - - - - - - -    Fall Risk Fall Risk  08/30/2020 06/01/2020 11/29/2019 08/16/2019 06/03/2019  Falls in the past year? 0 0 0 0 0  Number falls in past yr: 0 0 0 0 -  Injury with Fall? 0 - - - -  Follow up  Falls evaluation completed Falls evaluation completed Falls evaluation completed Falls evaluation completed Falls evaluation completed    FALL RISK PREVENTION PERTAINING TO THE HOME: Adequate lighting in your home to reduce risk of falls? Yes   ASSISTIVE DEVICES UTILIZED TO PREVENT FALLS: Use of a cane, walker or w/c? No   TIMED UP AND GO: Was the test performed? No .   Cognitive Function:  Patient is alert and oriented x3.    Immunizations Immunization History  Administered Date(s) Administered   Fluad Quad(high Dose 65+) 10/15/2018   Influenza Whole 11/10/2010   Influenza, High Dose Seasonal PF 10/10/2016   Influenza-Unspecified 10/06/2010, 11/08/2019   Pneumococcal Polysaccharide-23 10/15/2018   Pneumococcal-Unspecified 10/06/2007   Health Maintenance Health Maintenance  Topic Date Due   MAMMOGRAM  12/24/2019   INFLUENZA VACCINE  05/03/2021 (Originally 09/03/2020)   COLONOSCOPY (Pts 45-75yrs Insurance coverage will need to be confirmed)  02/03/2022   TETANUS/TDAP  02/04/2023   DEXA SCAN  Completed   Hepatitis C Screening  Completed   PNA vac Low Risk Adult  Completed   HPV VACCINES  Aged Out   COVID-19 Vaccine  Discontinued   Zoster Vaccines- Shingrix  Discontinued   DG Chest 2 View- completed 09/19/20  Vision Screening: Recommended annual ophthalmology exams for early detection of glaucoma and other disorders of the eye.  Dental Screening: Recommended annual dental exams for proper oral hygiene  Community Resource Referral / Chronic Care Management: CRR required this visit?  No   CCM required this visit?  No      Plan:   Keep all routine maintenance appointments.   I have personally reviewed and noted the following in the patient's chart:   Medical and social history Use of alcohol, tobacco or illicit drugs  Current medications and supplements including opioid prescriptions. Not taking opioid.  Functional ability and status Nutritional status  Physical  activity Advanced directives List of other physicians Hospitalizations, surgeries, and ER visits in previous 12 months Vitals Screenings to include cognitive, depression, and falls Referrals and appointments  In addition, I have reviewed and discussed with patient certain preventive protocols, quality metrics, and best practice recommendations. A written personalized care plan for preventive services as well as general preventive health recommendations were provided to patient via mychart.     Ashok Pall, LPN   1/70/0174

## 2020-10-05 DIAGNOSIS — R079 Chest pain, unspecified: Secondary | ICD-10-CM | POA: Diagnosis not present

## 2020-10-05 DIAGNOSIS — E782 Mixed hyperlipidemia: Secondary | ICD-10-CM | POA: Diagnosis not present

## 2020-10-05 DIAGNOSIS — I25810 Atherosclerosis of coronary artery bypass graft(s) without angina pectoris: Secondary | ICD-10-CM | POA: Diagnosis not present

## 2020-10-05 DIAGNOSIS — I10 Essential (primary) hypertension: Secondary | ICD-10-CM | POA: Diagnosis not present

## 2020-10-05 DIAGNOSIS — I2510 Atherosclerotic heart disease of native coronary artery without angina pectoris: Secondary | ICD-10-CM | POA: Diagnosis not present

## 2020-10-05 DIAGNOSIS — I4891 Unspecified atrial fibrillation: Secondary | ICD-10-CM | POA: Diagnosis not present

## 2020-10-09 DIAGNOSIS — I25810 Atherosclerosis of coronary artery bypass graft(s) without angina pectoris: Secondary | ICD-10-CM | POA: Diagnosis not present

## 2020-10-09 DIAGNOSIS — I2510 Atherosclerotic heart disease of native coronary artery without angina pectoris: Secondary | ICD-10-CM | POA: Diagnosis not present

## 2020-10-09 DIAGNOSIS — E782 Mixed hyperlipidemia: Secondary | ICD-10-CM | POA: Diagnosis not present

## 2020-10-09 DIAGNOSIS — I4891 Unspecified atrial fibrillation: Secondary | ICD-10-CM | POA: Diagnosis not present

## 2020-10-09 DIAGNOSIS — R079 Chest pain, unspecified: Secondary | ICD-10-CM | POA: Diagnosis not present

## 2020-10-09 DIAGNOSIS — I10 Essential (primary) hypertension: Secondary | ICD-10-CM | POA: Diagnosis not present

## 2020-10-11 ENCOUNTER — Ambulatory Visit: Payer: Medicare Other | Admitting: Urology

## 2020-10-11 ENCOUNTER — Ambulatory Visit (INDEPENDENT_AMBULATORY_CARE_PROVIDER_SITE_OTHER): Payer: Medicare Other | Admitting: Urology

## 2020-10-11 ENCOUNTER — Other Ambulatory Visit: Payer: Self-pay

## 2020-10-11 ENCOUNTER — Encounter: Payer: Self-pay | Admitting: Urology

## 2020-10-11 VITALS — BP 113/68 | HR 54 | Ht 69.02 in | Wt 230.0 lb

## 2020-10-11 DIAGNOSIS — N3281 Overactive bladder: Secondary | ICD-10-CM

## 2020-10-11 DIAGNOSIS — N200 Calculus of kidney: Secondary | ICD-10-CM | POA: Diagnosis not present

## 2020-10-11 DIAGNOSIS — N1330 Unspecified hydronephrosis: Secondary | ICD-10-CM

## 2020-10-11 LAB — BLADDER SCAN AMB NON-IMAGING

## 2020-10-11 MED ORDER — TOLTERODINE TARTRATE ER 4 MG PO CP24: 4.0000 mg | ORAL_CAPSULE | Freq: Every day | ORAL | 11 refills | Status: DC

## 2020-10-11 NOTE — Progress Notes (Signed)
   10/11/2020 2:50 PM   WONDA GOODGAME September 14, 1950 [id number]  Reason for visit: Follow up chronic left hydronephrosis, overactive bladder  HPI: 70 year old female with chronic left hydronephrosis since at least 2014.  She was previously managed by Dr. Achilles Dunk who attempted to treat her left distal ureteral stone in 2015, but was unable to access the stone or place a stent and they opted for observation for her chronically obstructed left kidney. They opted for observation and her left distal ureteral stone remains in situ with chronic left hydronephrosis and left renal atrophy.   She was recently hospitalized for A. fib with RVR, and renal function varied, but overall was stable from prior.  She denies any left-sided flank pain or UTIs.  I reviewed the inpatient notes from her recent hospitalization.  We previously started oxybutynin 10 mg XL daily for her OAB symptoms, and she had had significant improvement.  She reports some bothersome urinary frequency and urgency during the day, and is interested in a trial of a different medication.  She continues to have nocturia 3 times per night.  She drinks water and Sprite during the day, and occasionally tea.  She minimizes fluids before bed.  Continue follow-up with PCP for repeat BMP after hospitalization for A. fib/RVR Discontinue oxybutynin, trial of Detrol 4 mg XL daily   Sondra Come, MD  [facility] [address], [address] [place], [place] [address] [phone]

## 2020-10-16 DIAGNOSIS — N1831 Chronic kidney disease, stage 3a: Secondary | ICD-10-CM | POA: Diagnosis not present

## 2020-10-16 DIAGNOSIS — N2581 Secondary hyperparathyroidism of renal origin: Secondary | ICD-10-CM | POA: Diagnosis not present

## 2020-10-16 DIAGNOSIS — I10 Essential (primary) hypertension: Secondary | ICD-10-CM | POA: Diagnosis not present

## 2020-10-16 DIAGNOSIS — N1330 Unspecified hydronephrosis: Secondary | ICD-10-CM | POA: Diagnosis not present

## 2020-10-23 DIAGNOSIS — I25810 Atherosclerosis of coronary artery bypass graft(s) without angina pectoris: Secondary | ICD-10-CM | POA: Diagnosis not present

## 2020-10-23 DIAGNOSIS — I482 Chronic atrial fibrillation, unspecified: Secondary | ICD-10-CM | POA: Diagnosis not present

## 2020-10-23 DIAGNOSIS — R0602 Shortness of breath: Secondary | ICD-10-CM | POA: Diagnosis not present

## 2020-10-23 DIAGNOSIS — I2510 Atherosclerotic heart disease of native coronary artery without angina pectoris: Secondary | ICD-10-CM | POA: Diagnosis not present

## 2020-10-23 DIAGNOSIS — E782 Mixed hyperlipidemia: Secondary | ICD-10-CM | POA: Diagnosis not present

## 2020-10-23 DIAGNOSIS — I10 Essential (primary) hypertension: Secondary | ICD-10-CM | POA: Diagnosis not present

## 2020-10-26 DIAGNOSIS — R0602 Shortness of breath: Secondary | ICD-10-CM | POA: Diagnosis not present

## 2020-10-26 DIAGNOSIS — I2510 Atherosclerotic heart disease of native coronary artery without angina pectoris: Secondary | ICD-10-CM | POA: Diagnosis not present

## 2020-10-26 DIAGNOSIS — I25810 Atherosclerosis of coronary artery bypass graft(s) without angina pectoris: Secondary | ICD-10-CM | POA: Diagnosis not present

## 2020-10-26 DIAGNOSIS — E782 Mixed hyperlipidemia: Secondary | ICD-10-CM | POA: Diagnosis not present

## 2020-10-26 DIAGNOSIS — I482 Chronic atrial fibrillation, unspecified: Secondary | ICD-10-CM | POA: Diagnosis not present

## 2020-10-26 DIAGNOSIS — I10 Essential (primary) hypertension: Secondary | ICD-10-CM | POA: Diagnosis not present

## 2020-12-11 ENCOUNTER — Other Ambulatory Visit: Payer: Self-pay | Admitting: Family Medicine

## 2021-01-01 ENCOUNTER — Ambulatory Visit: Payer: Medicare Other | Admitting: Family Medicine

## 2021-01-03 ENCOUNTER — Other Ambulatory Visit: Payer: Self-pay

## 2021-01-03 ENCOUNTER — Telehealth (INDEPENDENT_AMBULATORY_CARE_PROVIDER_SITE_OTHER): Payer: BC Managed Care – PPO | Admitting: Family Medicine

## 2021-01-03 ENCOUNTER — Encounter: Payer: Self-pay | Admitting: Family Medicine

## 2021-01-03 VITALS — Ht 69.0 in | Wt 230.0 lb

## 2021-01-03 DIAGNOSIS — J4520 Mild intermittent asthma, uncomplicated: Secondary | ICD-10-CM

## 2021-01-03 DIAGNOSIS — I151 Hypertension secondary to other renal disorders: Secondary | ICD-10-CM

## 2021-01-03 DIAGNOSIS — I4891 Unspecified atrial fibrillation: Secondary | ICD-10-CM | POA: Diagnosis not present

## 2021-01-03 DIAGNOSIS — I10 Essential (primary) hypertension: Secondary | ICD-10-CM | POA: Diagnosis not present

## 2021-01-03 DIAGNOSIS — E785 Hyperlipidemia, unspecified: Secondary | ICD-10-CM | POA: Diagnosis not present

## 2021-01-03 DIAGNOSIS — R7303 Prediabetes: Secondary | ICD-10-CM | POA: Diagnosis not present

## 2021-01-03 DIAGNOSIS — N2889 Other specified disorders of kidney and ureter: Secondary | ICD-10-CM

## 2021-01-03 NOTE — Assessment & Plan Note (Signed)
Asymptomatic.  Discussed that the pulse could have dropped due to her atrial fibrillation or her medications.  Advised that if it dropped down into the 40s again she would need to contact her cardiologist right away.  She will keep her scheduled appointment with them.

## 2021-01-03 NOTE — Progress Notes (Signed)
Virtual Visit via telephone Note  This visit type was conducted due to national recommendations for restrictions regarding the COVID-19 pandemic (e.g. social distancing).  This format is felt to be most appropriate for this patient at this time.  All issues noted in this document were discussed and addressed.  No physical exam was performed (except for noted visual exam findings with Video Visits).   I connected with Audrey Wolf today at  1:30 PM EST by telephone and verified that I am speaking with the correct person using two identifiers. Location patient: car Location provider: home office Persons participating in the virtual visit: patient, provider  I discussed the limitations, risks, security and privacy concerns of performing an evaluation and management service by telephone and the availability of in person appointments. I also discussed with the patient that there may be a patient responsible charge related to this service. The patient expressed understanding and agreed to proceed.  Interactive audio and video telecommunications were attempted between this provider and patient, however failed, due to patient having technical difficulties OR patient did not have access to video capability.  We continued and completed visit with audio only.   Reason for visit: f/u  HPI: HYPERTENSION Disease Monitoring Home BP Monitoring 180-200 systolic though she is not sure about the accuracy of her cuff Chest pain- no    Dyspnea- chronic stable Medications Compliance-  taking coreg, diltiazem.   Edema- no BMET    Component Value Date/Time   NA 138 09/22/2020 0528   NA 144 08/19/2019 1533   NA 140 03/01/2012 1132   K 3.9 09/22/2020 0528   K 3.5 03/01/2012 1132   CL 106 09/22/2020 0528   CL 108 (H) 03/01/2012 1132   CO2 24 09/22/2020 0528   CO2 23 03/01/2012 1132   GLUCOSE 97 09/22/2020 0528   GLUCOSE 92 03/01/2012 1132   BUN 25 (H) 09/22/2020 0528   BUN 20 08/19/2019 1533   BUN  22 (H) 03/01/2012 1132   CREATININE 1.30 (H) 09/22/2020 0528   CREATININE 1.04 (H) 11/12/2018 1457   CALCIUM 9.0 09/22/2020 0528   CALCIUM 8.6 03/01/2012 1132   GFRNONAA 44 (L) 09/22/2020 0528   GFRNONAA >60 03/01/2012 1132   GFRAA 67 08/19/2019 1533   GFRAA >60 03/01/2012 1132   Asthma: Patient notes no coughing or wheezing.  She has chronic stable dyspnea.  The Symbicort is beneficial.  Atrial fibrillation: Patient notes no palpitations though does note her pulse dropped into the 40s on a couple of occasions 1 to 2 weeks ago.  The pulse recovered into the 60s fairly quickly.  She is on carvedilol, diltiazem, amiodarone, and Eliquis.  No bleeding issues.  She sees her cardiologist in about 2 weeks.   ROS: See pertinent positives and negatives per HPI.  Past Medical History:  Diagnosis Date   Arthritis    Asthma    CHF (congestive heart failure) (HCC)    Chronic kidney disease    Coronary artery disease    GERD (gastroesophageal reflux disease)    Headache    Hypertension    Nephrolithiasis    NSTEMI (non-ST elevated myocardial infarction) (HCC)    Postprandial RUQ pain 05/25/2019    Past Surgical History:  Procedure Laterality Date   ABDOMINAL HYSTERECTOMY     APPENDECTOMY     BREAST BIOPSY Left 12/31/2010   neg   CORONARY ARTERY BYPASS GRAFT N/A 05/22/2017   Procedure: CORONARY ARTERY BYPASS GRAFTING (CABG) x 2 WITH ENDOSCOPIC HARVESTING OF  RIGHT SAPHENOUS VEIN;  Surgeon: Delight Ovens, MD;  Location: [facility] OR;  Service: Open Heart Surgery;  Laterality: N/A;   LEFT HEART CATH AND CORONARY ANGIOGRAPHY N/A 05/18/2017   Procedure: LEFT HEART CATH AND CORONARY ANGIOGRAPHY;  Surgeon: Laurier Nancy, MD;  Location: [facility] INVASIVE CV LAB;  Service: Cardiovascular;  Laterality: N/A;   LITHOTRIPSY     TEE WITHOUT CARDIOVERSION N/A 05/22/2017   Procedure: TRANSESOPHAGEAL ECHOCARDIOGRAM (TEE);  Surgeon: Delight Ovens, MD;  Location: [place] OR;  Service: Open Heart Surgery;   Laterality: N/A;   TONSILLECTOMY      Family History  Problem Relation Age of Onset   Arthritis Mother    Arthritis Father    Heart disease Father    Stroke Father    Sudden Cardiac Death Father    Stroke Son    Breast cancer Maternal Aunt     SOCIAL HX: nonsmoker   Current Outpatient Medications:    acetaminophen (TYLENOL) 500 MG tablet, Take 1 tablet (500 mg total) by mouth daily as needed for mild pain or headache., Disp: 30 tablet, Rfl: 0   budesonide-formoterol (SYMBICORT) 80-4.5 MCG/ACT inhaler, Inhale 2 puffs into the lungs 2 (two) times daily., Disp: 1 each, Rfl: 3   carvedilol (COREG) 25 MG tablet, Take 25 mg by mouth 2 (two) times daily with a meal., Disp: , Rfl:    famotidine (PEPCID) 20 MG tablet, Take 20 mg by mouth 2 (two) times daily., Disp: , Rfl:    loratadine (CLARITIN) 10 MG tablet, Take 10 mg by mouth daily. , Disp: , Rfl:    Multiple Vitamins-Minerals (PRESERVISION AREDS 2) CAPS, Take 2 capsules by mouth daily., Disp: , Rfl:    rosuvastatin (CRESTOR) 20 MG tablet, Take 1 tablet by mouth once daily, Disp: 90 tablet, Rfl: 1   tolterodine (DETROL LA) 4 MG 24 hr capsule, Take 1 capsule (4 mg total) by mouth daily., Disp: 30 capsule, Rfl: 11   amiodarone (PACERONE) 400 MG tablet, Take 1 tablet (400 mg total) by mouth 2 (two) times daily. (Patient taking differently: Take 400 mg by mouth daily.), Disp: 60 tablet, Rfl: 0   apixaban (ELIQUIS) 5 MG TABS tablet, Take 1 tablet (5 mg total) by mouth 2 (two) times daily., Disp: 60 tablet, Rfl: 2   diltiazem (CARDIZEM) 30 MG tablet, Take 1 tablet (30 mg total) by mouth every 6 (six) hours., Disp: 120 tablet, Rfl: 2  EXAM: This was a telephone visit and thus no exam was completed.  ASSESSMENT AND PLAN:  Discussed the following assessment and plan:  Problem List Items Addressed This Visit     Asthma    Generally stable.  She can continue Symbicort.      Atrial fibrillation (HCC) - Primary    Asymptomatic.  Discussed  that the pulse could have dropped due to her atrial fibrillation or her medications.  Advised that if it dropped down into the 40s again she would need to contact her cardiologist right away.  She will keep her scheduled appointment with them.      Hyperlipidemia   Relevant Orders   Lipid panel   Hepatic function panel   Hypertension    Elevated though there is some question of whether or not her blood pressure cuff is accurate.  She will come into the office for a nurse blood pressure check.  She will continue carvedilol 25 mg twice daily and diltiazem 30 mg by mouth every 6 hours.  Prediabetes   Relevant Orders   Hemoglobin A1c   CMA will get the patient scheduled for nurse blood pressure check and labs.  Return in about 4 months (around 05/04/2021).   I discussed the assessment and treatment plan with the patient. The patient was provided an opportunity to ask questions and all were answered. The patient agreed with the plan and demonstrated an understanding of the instructions.   The patient was advised to call back or seek an in-person evaluation if the symptoms worsen or if the condition fails to improve as anticipated.  I provided 15 minutes of non-face-to-face time during this encounter.   [name], MD

## 2021-01-03 NOTE — Assessment & Plan Note (Signed)
Elevated though there is some question of whether or not her blood pressure cuff is accurate.  She will come into the office for a nurse blood pressure check.  She will continue carvedilol 25 mg twice daily and diltiazem 30 mg by mouth every 6 hours.

## 2021-01-03 NOTE — Assessment & Plan Note (Signed)
Generally stable.  She can continue Symbicort.

## 2021-01-08 ENCOUNTER — Ambulatory Visit: Payer: Medicare Other

## 2021-01-08 ENCOUNTER — Other Ambulatory Visit: Payer: Medicare Other

## 2021-01-08 ENCOUNTER — Encounter: Payer: Self-pay | Admitting: Family Medicine

## 2021-01-11 ENCOUNTER — Other Ambulatory Visit: Payer: Self-pay

## 2021-01-11 ENCOUNTER — Other Ambulatory Visit: Payer: Medicare Other

## 2021-01-11 ENCOUNTER — Ambulatory Visit (INDEPENDENT_AMBULATORY_CARE_PROVIDER_SITE_OTHER): Payer: BC Managed Care – PPO

## 2021-01-11 DIAGNOSIS — E785 Hyperlipidemia, unspecified: Secondary | ICD-10-CM

## 2021-01-11 DIAGNOSIS — R7303 Prediabetes: Secondary | ICD-10-CM

## 2021-01-11 LAB — HEPATIC FUNCTION PANEL
ALT: 14 U/L (ref 0–35)
AST: 17 U/L (ref 0–37)
Albumin: 4.1 g/dL (ref 3.5–5.2)
Alkaline Phosphatase: 70 U/L (ref 39–117)
Bilirubin, Direct: 0.1 mg/dL (ref 0.0–0.3)
Total Bilirubin: 0.7 mg/dL (ref 0.2–1.2)
Total Protein: 6.3 g/dL (ref 6.0–8.3)

## 2021-01-11 LAB — LIPID PANEL
Cholesterol: 122 mg/dL (ref 0–200)
HDL: 40.8 mg/dL (ref >39.00)
LDL Cholesterol: 62 mg/dL (ref 0–99)
NonHDL: 80.97
Total CHOL/HDL Ratio: 3
Triglycerides: 95 mg/dL (ref 0.0–149.0)
VLDL: 19 mg/dL (ref 0.0–40.0)

## 2021-01-11 LAB — HEMOGLOBIN A1C: Hgb A1c MFr Bld: 5.7 % (ref 4.6–6.5)

## 2021-01-11 NOTE — Progress Notes (Signed)
Patient is here for a BP check due to bp being high at last visit, as per patient.  Currently patients BP is 164/74 and BPM is 50. Ten minutes prior BP was 192/84 and BPM 60. Patient has no complaints of headaches, blurry vision, chest pain, arm pain, light headedness, dizziness, and nor jaw pain. Please see previous note for order.

## 2021-01-23 NOTE — Progress Notes (Signed)
Spoken to patient. She has been taking her BP at home ranges 200-160/100-65. She has an appointment in January to see her kidney specialist.

## 2021-01-25 DIAGNOSIS — I25810 Atherosclerosis of coronary artery bypass graft(s) without angina pectoris: Secondary | ICD-10-CM | POA: Diagnosis not present

## 2021-01-25 DIAGNOSIS — E782 Mixed hyperlipidemia: Secondary | ICD-10-CM | POA: Diagnosis not present

## 2021-01-25 DIAGNOSIS — I2510 Atherosclerotic heart disease of native coronary artery without angina pectoris: Secondary | ICD-10-CM | POA: Diagnosis not present

## 2021-01-25 DIAGNOSIS — E669 Obesity, unspecified: Secondary | ICD-10-CM | POA: Diagnosis not present

## 2021-01-25 DIAGNOSIS — I340 Nonrheumatic mitral (valve) insufficiency: Secondary | ICD-10-CM | POA: Diagnosis not present

## 2021-01-25 DIAGNOSIS — I10 Essential (primary) hypertension: Secondary | ICD-10-CM | POA: Diagnosis not present

## 2021-01-25 DIAGNOSIS — I4891 Unspecified atrial fibrillation: Secondary | ICD-10-CM | POA: Diagnosis not present

## 2021-02-08 DIAGNOSIS — I10 Essential (primary) hypertension: Secondary | ICD-10-CM | POA: Diagnosis not present

## 2021-02-08 DIAGNOSIS — I25810 Atherosclerosis of coronary artery bypass graft(s) without angina pectoris: Secondary | ICD-10-CM | POA: Diagnosis not present

## 2021-02-08 DIAGNOSIS — I4891 Unspecified atrial fibrillation: Secondary | ICD-10-CM | POA: Diagnosis not present

## 2021-02-08 DIAGNOSIS — I340 Nonrheumatic mitral (valve) insufficiency: Secondary | ICD-10-CM | POA: Diagnosis not present

## 2021-02-08 DIAGNOSIS — I2510 Atherosclerotic heart disease of native coronary artery without angina pectoris: Secondary | ICD-10-CM | POA: Diagnosis not present

## 2021-02-08 DIAGNOSIS — E669 Obesity, unspecified: Secondary | ICD-10-CM | POA: Diagnosis not present

## 2021-02-08 DIAGNOSIS — E782 Mixed hyperlipidemia: Secondary | ICD-10-CM | POA: Diagnosis not present

## 2021-02-18 DIAGNOSIS — N1330 Unspecified hydronephrosis: Secondary | ICD-10-CM | POA: Diagnosis not present

## 2021-02-18 DIAGNOSIS — N1832 Chronic kidney disease, stage 3b: Secondary | ICD-10-CM | POA: Diagnosis not present

## 2021-02-18 DIAGNOSIS — N2581 Secondary hyperparathyroidism of renal origin: Secondary | ICD-10-CM | POA: Diagnosis not present

## 2021-02-18 DIAGNOSIS — I10 Essential (primary) hypertension: Secondary | ICD-10-CM | POA: Diagnosis not present

## 2021-02-26 ENCOUNTER — Other Ambulatory Visit: Payer: Self-pay

## 2021-02-26 ENCOUNTER — Ambulatory Visit (INDEPENDENT_AMBULATORY_CARE_PROVIDER_SITE_OTHER): Payer: BC Managed Care – PPO | Admitting: Podiatry

## 2021-02-26 DIAGNOSIS — Q828 Other specified congenital malformations of skin: Secondary | ICD-10-CM | POA: Diagnosis not present

## 2021-02-26 NOTE — Progress Notes (Signed)
Subjective:  Patient ID: Audrey Wolf, female    DOB: December 09, 1950,  MRN: [id number]  Chief Complaint  Patient presents with   Toe Pain    Pain in left foot last 3 toes     71 y.o. female presents with the above complaint.  Patient presents with left fifth lateral hyperkeratotic lesion/porokeratotic lesion.  Patient states painful to touch painful to walk on has progressed gotten worse.  She wanted get it evaluated.  She states hurts with ambulation.  She is tried shoe gear modification none of which has helped.  She would like to discuss treatment options for this.  She has not seen anyone else prior to seeing me.   Review of Systems: Negative except as noted in the HPI. Denies N/V/F/Ch.  Past Medical History:  Diagnosis Date   Arthritis    Asthma    CHF (congestive heart failure) (HCC)    Chronic kidney disease    Coronary artery disease    GERD (gastroesophageal reflux disease)    Headache    Hypertension    Nephrolithiasis    NSTEMI (non-ST elevated myocardial infarction) (HCC)    Postprandial RUQ pain 05/25/2019    Current Outpatient Medications:    acetaminophen (TYLENOL) 500 MG tablet, Take 1 tablet (500 mg total) by mouth daily as needed for mild pain or headache., Disp: 30 tablet, Rfl: 0   amiodarone (PACERONE) 400 MG tablet, Take 1 tablet (400 mg total) by mouth 2 (two) times daily. (Patient taking differently: Take 400 mg by mouth daily.), Disp: 60 tablet, Rfl: 0   apixaban (ELIQUIS) 5 MG TABS tablet, Take 1 tablet (5 mg total) by mouth 2 (two) times daily., Disp: 60 tablet, Rfl: 2   budesonide-formoterol (SYMBICORT) 80-4.5 MCG/ACT inhaler, Inhale 2 puffs into the lungs 2 (two) times daily., Disp: 1 each, Rfl: 3   carvedilol (COREG) 25 MG tablet, Take 25 mg by mouth 2 (two) times daily with a meal., Disp: , Rfl:    diltiazem (CARDIZEM) 30 MG tablet, Take 1 tablet (30 mg total) by mouth every 6 (six) hours., Disp: 120 tablet, Rfl: 2   famotidine (PEPCID) 20 MG  tablet, Take 20 mg by mouth 2 (two) times daily., Disp: , Rfl:    loratadine (CLARITIN) 10 MG tablet, Take 10 mg by mouth daily. , Disp: , Rfl:    Multiple Vitamins-Minerals (PRESERVISION AREDS 2) CAPS, Take 2 capsules by mouth daily., Disp: , Rfl:    rosuvastatin (CRESTOR) 20 MG tablet, Take 1 tablet by mouth once daily, Disp: 90 tablet, Rfl: 1   tolterodine (DETROL LA) 4 MG 24 hr capsule, Take 1 capsule (4 mg total) by mouth daily., Disp: 30 capsule, Rfl: 11  Social History   Tobacco Use  Smoking Status Never  Smokeless Tobacco Never    Allergies  Allergen Reactions   Lisinopril Cough   Ace Inhibitors Other (See Comments)    Has been told to avoid these and any diuretics because "left kidney has shrunk up and doesn't work"   Objective:  There were no vitals filed for this visit. There is no height or weight on file to calculate BMI. Constitutional Well developed. Well nourished.  Vascular Dorsalis pedis pulses palpable bilaterally. Posterior tibial pulses palpable bilaterally. Capillary refill normal to all digits.  No cyanosis or clubbing noted. Pedal hair growth normal.  Neurologic Normal speech. Oriented to person, place, and time. Epicritic sensation to light touch grossly present bilaterally.  Dermatologic Hyperkeratotic lesion with central nucleated core noted  to left fifth digit.  Pain on palpation to the left fifth digit with central nucleated core.  No pinpoint bleeding noted upon debridement  Orthopedic: Normal joint ROM without pain or crepitus bilaterally. No visible deformities. No bony tenderness.   Radiographs: None Assessment:   1. Porokeratosis    Plan:  Patient was evaluated and treated and all questions answered.  Left fifth porokeratotic lesion with underlying hammertoe contracture and adductovarus deformity -All questions and concerns were discussed with the patient in extensive detail. -I discussed shoe gear modification in extensive detail. -I  discussed with the patient that she will benefit from debridement of the lesion using chisel blade handle the lesion was debrided down to healthy striated tissue followed by excision of the central nucleated core.  No complication noted.  Pain improved some. -If this continues to bother I discussed surgical intervention.  She states understand would like to hold off any kind of surgery -Also discussed toe protector as well.  No follow-ups on file.

## 2021-03-12 DIAGNOSIS — E782 Mixed hyperlipidemia: Secondary | ICD-10-CM | POA: Diagnosis not present

## 2021-03-12 DIAGNOSIS — I2510 Atherosclerotic heart disease of native coronary artery without angina pectoris: Secondary | ICD-10-CM | POA: Diagnosis not present

## 2021-03-12 DIAGNOSIS — I25810 Atherosclerosis of coronary artery bypass graft(s) without angina pectoris: Secondary | ICD-10-CM | POA: Diagnosis not present

## 2021-03-12 DIAGNOSIS — E669 Obesity, unspecified: Secondary | ICD-10-CM | POA: Diagnosis not present

## 2021-03-12 DIAGNOSIS — I10 Essential (primary) hypertension: Secondary | ICD-10-CM | POA: Diagnosis not present

## 2021-03-12 DIAGNOSIS — I340 Nonrheumatic mitral (valve) insufficiency: Secondary | ICD-10-CM | POA: Diagnosis not present

## 2021-03-12 DIAGNOSIS — I4891 Unspecified atrial fibrillation: Secondary | ICD-10-CM | POA: Diagnosis not present

## 2021-03-20 ENCOUNTER — Other Ambulatory Visit: Payer: Self-pay

## 2021-03-20 ENCOUNTER — Ambulatory Visit (INDEPENDENT_AMBULATORY_CARE_PROVIDER_SITE_OTHER): Payer: Medicare Other | Admitting: Urology

## 2021-03-20 ENCOUNTER — Encounter: Payer: Self-pay | Admitting: Urology

## 2021-03-20 VITALS — BP 161/75 | HR 59 | Ht 69.0 in | Wt 200.0 lb

## 2021-03-20 DIAGNOSIS — N200 Calculus of kidney: Secondary | ICD-10-CM

## 2021-03-20 DIAGNOSIS — N3281 Overactive bladder: Secondary | ICD-10-CM | POA: Diagnosis not present

## 2021-03-20 DIAGNOSIS — N1339 Other hydronephrosis: Secondary | ICD-10-CM | POA: Diagnosis not present

## 2021-03-20 NOTE — Progress Notes (Signed)
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  She was previously managed by Dr. Jacqlyn Larsen who attempted to treat her left distal ureteral stone in 2015, but was unable to access the stone or place a stent and they opted for observation for her chronically obstructed atrophic left kidney. They opted for observation and her left distal ureteral stone remains in situ with chronic left hydronephrosis and left renal atrophy.   I personally viewed and interpreted her most recent renal imaging from April 2021, and ultrasound at that time shows severe left hydronephrosis with severe left renal cortical thinning which is stable from 2014.  Renal function has worsened slightly with recent creatinine of 1.4(eGFR 40) from 1.37(42) in September 2022.  It sounds like nephrology referred her back over to see if there were alternative options for her hydronephrosis.  I had a frank conversation with the patient today that her left kidney is completely atrophied since at least 2014, and there is no intervention like stent or nephrostomy tube that would likely have any improvement in the renal function.  She has poorly controlled hypertension, which is likely contributing to her worsening renal function.  She also has overactive bladder, and has been well managed on Detrol 4 mg XL daily, and this was refilled today.  No role for any urologic/surgical intervention for completely atrophic left kidney with severe hydronephrosis since at least 2014 from ureteral stone.  CKD management per nephrology, counseled on hypertension management.  Continue Detrol for OAB symptoms.  RTC 1 year  Billey Co, MD  [facility] [address] [place] [address], [place] [address] [phone]

## 2021-05-06 ENCOUNTER — Other Ambulatory Visit: Payer: Self-pay | Admitting: Family Medicine

## 2021-05-28 DIAGNOSIS — I340 Nonrheumatic mitral (valve) insufficiency: Secondary | ICD-10-CM | POA: Diagnosis not present

## 2021-06-07 ENCOUNTER — Other Ambulatory Visit: Payer: Self-pay | Admitting: Family Medicine

## 2021-06-11 DIAGNOSIS — I25810 Atherosclerosis of coronary artery bypass graft(s) without angina pectoris: Secondary | ICD-10-CM | POA: Diagnosis not present

## 2021-06-11 DIAGNOSIS — I340 Nonrheumatic mitral (valve) insufficiency: Secondary | ICD-10-CM | POA: Diagnosis not present

## 2021-06-11 DIAGNOSIS — I10 Essential (primary) hypertension: Secondary | ICD-10-CM | POA: Diagnosis not present

## 2021-06-11 DIAGNOSIS — E782 Mixed hyperlipidemia: Secondary | ICD-10-CM | POA: Diagnosis not present

## 2021-06-11 DIAGNOSIS — E669 Obesity, unspecified: Secondary | ICD-10-CM | POA: Diagnosis not present

## 2021-06-11 DIAGNOSIS — I2510 Atherosclerotic heart disease of native coronary artery without angina pectoris: Secondary | ICD-10-CM | POA: Diagnosis not present

## 2021-06-11 DIAGNOSIS — I4891 Unspecified atrial fibrillation: Secondary | ICD-10-CM | POA: Diagnosis not present

## 2021-06-25 DIAGNOSIS — I10 Essential (primary) hypertension: Secondary | ICD-10-CM | POA: Diagnosis not present

## 2021-06-25 DIAGNOSIS — N1832 Chronic kidney disease, stage 3b: Secondary | ICD-10-CM | POA: Diagnosis not present

## 2021-06-25 DIAGNOSIS — N2581 Secondary hyperparathyroidism of renal origin: Secondary | ICD-10-CM | POA: Diagnosis not present

## 2021-06-27 ENCOUNTER — Other Ambulatory Visit (INDEPENDENT_AMBULATORY_CARE_PROVIDER_SITE_OTHER): Payer: Self-pay | Admitting: Nephrology

## 2021-06-27 DIAGNOSIS — I10 Essential (primary) hypertension: Secondary | ICD-10-CM

## 2021-06-28 ENCOUNTER — Ambulatory Visit (INDEPENDENT_AMBULATORY_CARE_PROVIDER_SITE_OTHER): Payer: BC Managed Care – PPO

## 2021-06-28 DIAGNOSIS — I10 Essential (primary) hypertension: Secondary | ICD-10-CM | POA: Diagnosis not present

## 2021-07-02 DIAGNOSIS — I4891 Unspecified atrial fibrillation: Secondary | ICD-10-CM | POA: Diagnosis not present

## 2021-07-02 DIAGNOSIS — I2510 Atherosclerotic heart disease of native coronary artery without angina pectoris: Secondary | ICD-10-CM | POA: Diagnosis not present

## 2021-07-02 DIAGNOSIS — E669 Obesity, unspecified: Secondary | ICD-10-CM | POA: Diagnosis not present

## 2021-07-02 DIAGNOSIS — E782 Mixed hyperlipidemia: Secondary | ICD-10-CM | POA: Diagnosis not present

## 2021-07-02 DIAGNOSIS — I25810 Atherosclerosis of coronary artery bypass graft(s) without angina pectoris: Secondary | ICD-10-CM | POA: Diagnosis not present

## 2021-07-02 DIAGNOSIS — I340 Nonrheumatic mitral (valve) insufficiency: Secondary | ICD-10-CM | POA: Diagnosis not present

## 2021-07-02 DIAGNOSIS — I10 Essential (primary) hypertension: Secondary | ICD-10-CM | POA: Diagnosis not present

## 2021-07-09 DIAGNOSIS — I25810 Atherosclerosis of coronary artery bypass graft(s) without angina pectoris: Secondary | ICD-10-CM | POA: Diagnosis not present

## 2021-07-09 DIAGNOSIS — R55 Syncope and collapse: Secondary | ICD-10-CM | POA: Diagnosis not present

## 2021-07-09 DIAGNOSIS — I340 Nonrheumatic mitral (valve) insufficiency: Secondary | ICD-10-CM | POA: Diagnosis not present

## 2021-07-09 DIAGNOSIS — I4891 Unspecified atrial fibrillation: Secondary | ICD-10-CM | POA: Diagnosis not present

## 2021-07-09 DIAGNOSIS — E782 Mixed hyperlipidemia: Secondary | ICD-10-CM | POA: Diagnosis not present

## 2021-07-09 DIAGNOSIS — I2510 Atherosclerotic heart disease of native coronary artery without angina pectoris: Secondary | ICD-10-CM | POA: Diagnosis not present

## 2021-07-09 DIAGNOSIS — R0602 Shortness of breath: Secondary | ICD-10-CM | POA: Diagnosis not present

## 2021-07-09 DIAGNOSIS — I10 Essential (primary) hypertension: Secondary | ICD-10-CM | POA: Diagnosis not present

## 2021-07-19 ENCOUNTER — Telehealth: Payer: Self-pay | Admitting: Family Medicine

## 2021-07-19 ENCOUNTER — Other Ambulatory Visit: Payer: Self-pay

## 2021-07-19 MED ORDER — CARVEDILOL 25 MG PO TABS: 25.0000 mg | ORAL_TABLET | Freq: Two times a day (BID) | ORAL | 0 refills | Status: DC

## 2021-07-19 MED ORDER — ROSUVASTATIN CALCIUM 20 MG PO TABS: 20.0000 mg | ORAL_TABLET | Freq: Every day | ORAL | 0 refills | Status: DC

## 2021-07-19 NOTE — Telephone Encounter (Signed)
I let patient know that refill sent to [facility].

## 2021-07-19 NOTE — Telephone Encounter (Signed)
Pt need refill on carvedilol and rosuvastatin sent to [facility]

## 2021-07-23 ENCOUNTER — Ambulatory Visit (INDEPENDENT_AMBULATORY_CARE_PROVIDER_SITE_OTHER): Payer: BC Managed Care – PPO | Admitting: Family Medicine

## 2021-07-23 ENCOUNTER — Encounter: Payer: Self-pay | Admitting: Family Medicine

## 2021-07-23 DIAGNOSIS — S46812A Strain of other muscles, fascia and tendons at shoulder and upper arm level, left arm, initial encounter: Secondary | ICD-10-CM

## 2021-07-23 DIAGNOSIS — G4710 Hypersomnia, unspecified: Secondary | ICD-10-CM

## 2021-07-23 DIAGNOSIS — S46819A Strain of other muscles, fascia and tendons at shoulder and upper arm level, unspecified arm, initial encounter: Secondary | ICD-10-CM | POA: Insufficient documentation

## 2021-07-23 NOTE — Progress Notes (Signed)
[name], MD Phone: [phone]  Audrey Wolf is a 71 y.o. female who presents today for same-day visit.  Hypersomnia: Patient notes for some time now she has felt tired/sleepy most of the time.  She sleeps frequently during the day if she sits down.  She notes no snoring or apneic episodes though she lives by herself.  She notes she sleeps okay at night.  Left trapezius pain: Patient reports this has been going on since she stopped taking HCTZ and then subsequently stopped taking hydralazine.  She notes her nephrologist and then her cardiologist started her on those respectively though she could not tolerate them due to side effects.  She notes no injury to her neck.  No numbness, weakness, or radiation of the pain.  Social History   Tobacco Use  Smoking Status Never  Smokeless Tobacco Never    Current Outpatient Medications on File Prior to Visit  Medication Sig Dispense Refill   acetaminophen (TYLENOL) 500 MG tablet Take 1 tablet (500 mg total) by mouth daily as needed for mild pain or headache. 30 tablet 0   amiodarone (PACERONE) 400 MG tablet Take 1 tablet (400 mg total) by mouth 2 (two) times daily. 60 tablet 0   apixaban (ELIQUIS) 5 MG TABS tablet Take 1 tablet (5 mg total) by mouth 2 (two) times daily. 60 tablet 2   budesonide-formoterol (SYMBICORT) 80-4.5 MCG/ACT inhaler Inhale 2 puffs into the lungs 2 (two) times daily. 1 each 3   carvedilol (COREG) 25 MG tablet Take 1 tablet (25 mg total) by mouth 2 (two) times daily with a meal. 180 tablet 0   diltiazem (CARDIZEM) 30 MG tablet Take 1 tablet (30 mg total) by mouth every 6 (six) hours. 120 tablet 2   famotidine (PEPCID) 20 MG tablet Take 20 mg by mouth 2 (two) times daily.     loratadine (CLARITIN) 10 MG tablet Take 10 mg by mouth daily.      Multiple Vitamins-Minerals (PRESERVISION AREDS 2) CAPS Take 2 capsules by mouth daily.     rosuvastatin (CRESTOR) 20 MG tablet Take 1 tablet (20 mg total) by mouth daily. 90  tablet 0   tolterodine (DETROL LA) 4 MG 24 hr capsule Take 1 capsule (4 mg total) by mouth daily. 30 capsule 11   No current facility-administered medications on file prior to visit.     ROS see history of present illness  Objective  Physical Exam Vitals:   07/23/21 1254  BP: 130/60  Pulse: (!) 52  Resp: 14  Temp: 98.2 F (36.8 C)  SpO2: 98%    BP Readings from Last 3 Encounters:  07/23/21 130/60  03/20/21 (!) 161/75  01/11/21 (!) 164/74   Wt Readings from Last 3 Encounters:  07/23/21 218 lb 6.4 oz (99.1 kg)  03/20/21 200 lb (90.7 kg)  01/03/21 230 lb (104.3 kg)    Physical Exam Constitutional:      General: She is not in acute distress.    Appearance: She is not diaphoretic.  Neck:   Cardiovascular:     Rate and Rhythm: Normal rate and regular rhythm.     Heart sounds: Normal heart sounds.  Pulmonary:     Effort: Pulmonary effort is normal.     Breath sounds: Normal breath sounds.  Skin:    General: Skin is warm and dry.  Neurological:     Mental Status: She is alert.    Epworth sleepiness scale score of 10 with individual scores of 0, 3, 1,  0, 3, 0, 3, 0  Assessment/Plan: Please see individual problem list.  Problem List Items Addressed This Visit     Hypersomnia    Concerning for sleep apnea.  Discussed that potentially her Detrol could be playing a role.  We will obtain a sleep study and if negative would need to consider altering her overactive bladder medication.      Relevant Orders   Home sleep test   Trapezius strain    Suspect strain and spasm over the left trapezius.  Discussed that I am uncertain of the exact cause of this.  She cannot take anti-inflammatories.  I do not think muscle relaxers would be good given her already having excessive sleepiness.  Discussed use of Salonpas.  Advised that she complete physical therapy though she does not want to proceed with that at this time.  If not improving over the next few weeks she will let us  know and we can get an x-ray.       Return in about 3 months (around 10/23/2021).   [name], MD [facility]

## 2021-07-23 NOTE — Patient Instructions (Signed)
Nice to see you. Somebody will contact you to schedule the sleep study. Please try Salonpas for your neck.  If its not improving in the next few weeks please let us know. If you decide you would like to see PT please let us know as well.

## 2021-07-23 NOTE — Assessment & Plan Note (Signed)
Suspect strain and spasm over the left trapezius.  Discussed that I am uncertain of the exact cause of this.  She cannot take anti-inflammatories.  I do not think muscle relaxers would be good given her already having excessive sleepiness.  Discussed use of Salonpas.  Advised that she complete physical therapy though she does not want to proceed with that at this time.  If not improving over the next few weeks she will let us know and we can get an x-ray.

## 2021-07-23 NOTE — Assessment & Plan Note (Signed)
Concerning for sleep apnea.  Discussed that potentially her Detrol could be playing a role.  We will obtain a sleep study and if negative would need to consider altering her overactive bladder medication.

## 2021-07-24 NOTE — Progress Notes (Signed)
Mychart is deactivated per patient request.  Audrey Wolf,cma

## 2021-07-29 ENCOUNTER — Encounter: Payer: Self-pay | Admitting: Emergency Medicine

## 2021-07-29 DIAGNOSIS — I509 Heart failure, unspecified: Secondary | ICD-10-CM | POA: Insufficient documentation

## 2021-07-29 DIAGNOSIS — K91840 Postprocedural hemorrhage and hematoma of a digestive system organ or structure following a digestive system procedure: Secondary | ICD-10-CM | POA: Diagnosis not present

## 2021-07-29 DIAGNOSIS — J45909 Unspecified asthma, uncomplicated: Secondary | ICD-10-CM | POA: Insufficient documentation

## 2021-07-29 DIAGNOSIS — I129 Hypertensive chronic kidney disease with stage 1 through stage 4 chronic kidney disease, or unspecified chronic kidney disease: Secondary | ICD-10-CM | POA: Diagnosis not present

## 2021-07-29 DIAGNOSIS — I2510 Atherosclerotic heart disease of native coronary artery without angina pectoris: Secondary | ICD-10-CM | POA: Diagnosis not present

## 2021-07-29 DIAGNOSIS — K068 Other specified disorders of gingiva and edentulous alveolar ridge: Secondary | ICD-10-CM | POA: Diagnosis not present

## 2021-07-29 DIAGNOSIS — Z951 Presence of aortocoronary bypass graft: Secondary | ICD-10-CM | POA: Diagnosis not present

## 2021-07-29 DIAGNOSIS — Z7901 Long term (current) use of anticoagulants: Secondary | ICD-10-CM | POA: Diagnosis not present

## 2021-07-29 DIAGNOSIS — N183 Chronic kidney disease, stage 3 unspecified: Secondary | ICD-10-CM | POA: Insufficient documentation

## 2021-07-29 NOTE — ED Triage Notes (Signed)
Pt presents via POV with complaints of oral bleeding - pt states that she just chipped a tooth today at work and her gums have been bleeding. Pt on eliquis - bleeding is minimal compared to earlier. Denies pain.

## 2021-07-30 ENCOUNTER — Emergency Department
Admission: EM | Admit: 2021-07-30 | Discharge: 2021-07-30 | Disposition: A | Discharge status: Home / Self Care | Payer: BC Managed Care – PPO | Attending: Emergency Medicine | Admitting: Emergency Medicine

## 2021-07-30 DIAGNOSIS — K91840 Postprocedural hemorrhage and hematoma of a digestive system organ or structure following a digestive system procedure: Secondary | ICD-10-CM | POA: Diagnosis not present

## 2021-07-30 DIAGNOSIS — K068 Other specified disorders of gingiva and edentulous alveolar ridge: Secondary | ICD-10-CM

## 2021-07-30 MED ORDER — MICROFIBRILLAR COLL HEMOSTAT EX POWD
1.0000 g | Freq: Once | CUTANEOUS | Status: AC
  Administered 2021-07-30: 1 g via TOPICAL
  Filled 2021-07-30: qty 5

## 2021-08-16 DIAGNOSIS — I25810 Atherosclerosis of coronary artery bypass graft(s) without angina pectoris: Secondary | ICD-10-CM | POA: Diagnosis not present

## 2021-08-16 DIAGNOSIS — R0602 Shortness of breath: Secondary | ICD-10-CM | POA: Diagnosis not present

## 2021-08-16 DIAGNOSIS — I4891 Unspecified atrial fibrillation: Secondary | ICD-10-CM | POA: Diagnosis not present

## 2021-08-16 DIAGNOSIS — I2510 Atherosclerotic heart disease of native coronary artery without angina pectoris: Secondary | ICD-10-CM | POA: Diagnosis not present

## 2021-08-16 DIAGNOSIS — E669 Obesity, unspecified: Secondary | ICD-10-CM | POA: Diagnosis not present

## 2021-08-16 DIAGNOSIS — E782 Mixed hyperlipidemia: Secondary | ICD-10-CM | POA: Diagnosis not present

## 2021-08-16 DIAGNOSIS — I10 Essential (primary) hypertension: Secondary | ICD-10-CM | POA: Diagnosis not present

## 2021-08-16 DIAGNOSIS — I340 Nonrheumatic mitral (valve) insufficiency: Secondary | ICD-10-CM | POA: Diagnosis not present

## 2021-09-13 DIAGNOSIS — I10 Essential (primary) hypertension: Secondary | ICD-10-CM | POA: Diagnosis not present

## 2021-09-13 DIAGNOSIS — K8020 Calculus of gallbladder without cholecystitis without obstruction: Secondary | ICD-10-CM | POA: Diagnosis not present

## 2021-10-03 ENCOUNTER — Ambulatory Visit (INDEPENDENT_AMBULATORY_CARE_PROVIDER_SITE_OTHER): Payer: BC Managed Care – PPO

## 2021-10-03 VITALS — BP 144/70 | HR 52 | Temp 97.3°F | Resp 14 | Ht 69.0 in | Wt 214.2 lb

## 2021-10-03 DIAGNOSIS — Z0000 Encounter for general adult medical examination without abnormal findings: Secondary | ICD-10-CM

## 2021-10-03 NOTE — Progress Notes (Signed)
Subjective:   Audrey Wolf is a 71 y.o. female who presents for Medicare Annual (Subsequent) preventive examination.  Review of Systems    No ROS.  Medicare Wellness    Cardiac Risk Factors include: advanced age (>90men, >47 women);hypertension     Objective:    Today's Vitals   10/03/21 0907  BP: (!) 144/70  Pulse: (!) 52  Resp: 14  Temp: (!) 97.3 F (36.3 C)  SpO2: 100%  Weight: 214 lb 3.2 oz (97.2 kg)  Height: 5\' 9"  (1.753 m)   Body mass index is 31.63 kg/m.     10/03/2021    9:16 AM 07/29/2021   11:47 PM 10/02/2020    9:27 AM 09/20/2020    6:39 PM 09/19/2020    3:45 PM 05/19/2017    4:18 PM 05/18/2017   11:08 AM  Advanced Directives  Does Patient Have a Medical Advance Directive? Yes No No  No No No  Type of Paramedic of [place];Living will        Does patient want to make changes to medical advance directive? No - Patient declined     No - Patient declined   Copy of [place] in Chart? No - copy requested        Would patient like information on creating a medical advance directive?   Yes (MAU/Ambulatory/Procedural Areas - Information given) No - Guardian declined  No - Patient declined No - Patient declined    Current Medications (verified) Outpatient Encounter Medications as of 10/03/2021  Medication Sig   acetaminophen (TYLENOL) 500 MG tablet Take 1 tablet (500 mg total) by mouth daily as needed for mild pain or headache.   amiodarone (PACERONE) 400 MG tablet Take 1 tablet (400 mg total) by mouth 2 (two) times daily.   apixaban (ELIQUIS) 5 MG TABS tablet Take 1 tablet (5 mg total) by mouth 2 (two) times daily.   budesonide-formoterol (SYMBICORT) 80-4.5 MCG/ACT inhaler Inhale 2 puffs into the lungs 2 (two) times daily.   carvedilol (COREG) 25 MG tablet Take 1 tablet (25 mg total) by mouth 2 (two) times daily with a meal.   diltiazem (CARDIZEM) 30 MG tablet Take 1 tablet (30 mg total) by mouth every 6 (six) hours.    famotidine (PEPCID) 20 MG tablet Take 20 mg by mouth 2 (two) times daily.   loratadine (CLARITIN) 10 MG tablet Take 10 mg by mouth daily.    Multiple Vitamins-Minerals (PRESERVISION AREDS 2) CAPS Take 2 capsules by mouth daily.   rosuvastatin (CRESTOR) 20 MG tablet Take 1 tablet (20 mg total) by mouth daily.   tolterodine (DETROL LA) 4 MG 24 hr capsule Take 1 capsule (4 mg total) by mouth daily.   No facility-administered encounter medications on file as of 10/03/2021.    Allergies (verified) Lisinopril and Ace inhibitors   History: Past Medical History:  Diagnosis Date   Arthritis    Asthma    CHF (congestive heart failure) (HCC)    Chronic kidney disease    Coronary artery disease    GERD (gastroesophageal reflux disease)    Headache    Hypertension    Nephrolithiasis    NSTEMI (non-ST elevated myocardial infarction) ([place])    Postprandial RUQ pain 05/25/2019   Past Surgical History:  Procedure Laterality Date   ABDOMINAL HYSTERECTOMY     APPENDECTOMY     BREAST BIOPSY Left 12/31/2010   neg   CORONARY ARTERY BYPASS GRAFT N/A 05/22/2017   Procedure:  CORONARY ARTERY BYPASS GRAFTING (CABG) x 2 WITH ENDOSCOPIC HARVESTING OF RIGHT SAPHENOUS VEIN;  Surgeon: Delight Ovens, MD;  Location: [facility] OR;  Service: Open Heart Surgery;  Laterality: N/A;   LEFT HEART CATH AND CORONARY ANGIOGRAPHY N/A 05/18/2017   Procedure: LEFT HEART CATH AND CORONARY ANGIOGRAPHY;  Surgeon: Laurier Nancy, MD;  Location: [facility] INVASIVE CV LAB;  Service: Cardiovascular;  Laterality: N/A;   LITHOTRIPSY     TEE WITHOUT CARDIOVERSION N/A 05/22/2017   Procedure: TRANSESOPHAGEAL ECHOCARDIOGRAM (TEE);  Surgeon: Delight Ovens, MD;  Location: [facility] OR;  Service: Open Heart Surgery;  Laterality: N/A;   TONSILLECTOMY     Family History  Problem Relation Age of Onset   Arthritis Mother    Arthritis Father    Heart disease Father    Stroke Father    Sudden Cardiac Death Father    Stroke Son    Breast cancer  Maternal Aunt    Social History   Socioeconomic History   Marital status: Single    Spouse name: Not on file   Number of children: Not on file   Years of education: Not on file   Highest education level: Not on file  Occupational History    Employer: [employer]  Tobacco Use   Smoking status: Never   Smokeless tobacco: Never  Vaping Use   Vaping Use: Never used  Substance and Sexual Activity   Alcohol use: No   Drug use: No   Sexual activity: Not Currently    Partners: Male    Birth control/protection: Post-menopausal  Other Topics Concern   Not on file  Social History Narrative   Not on file   Social Determinants of Health   Financial Resource Strain: Low Risk  (10/03/2021)   Overall Financial Resource Strain (CARDIA)    Difficulty of Paying Living Expenses: Not hard at all  Food Insecurity: No Food Insecurity (10/03/2021)   Hunger Vital Sign    Worried About Running Out of Food in the Last Year: Never true    Ran Out of Food in the Last Year: Never true  Transportation Needs: No Transportation Needs (10/03/2021)   PRAPARE - Administrator, Civil Service (Medical): No    Lack of Transportation (Non-Medical): No  Physical Activity: Sufficiently Active (10/03/2021)   Exercise Vital Sign    Days of Exercise per Week: 5 days    Minutes of Exercise per Session: 30 min  Stress: No Stress Concern Present (10/03/2021)   [name]    Feeling of Stress : Only a little  Social Connections: Unknown (10/03/2021)   Social Connection and Isolation Panel [NHANES]    Frequency of Communication with Friends and Family: More than three times a week    Frequency of Social Gatherings with Friends and Family: More than three times a week    Attends Religious Services: Not on Marketing executive or Organizations: Not on file    Attends Banker Meetings: Not on file    Marital Status: Not on  file   Tobacco Counseling Counseling given: Not Answered  Clinical Intake: Pre-visit preparation completed: Yes       Diabetes: No  How often do you need to have someone help you when you read instructions, pamphlets, or other written materials from your doctor or pharmacy?: 1 - Never   Interpreter Needed?: No     Activities of Daily Living  10/03/2021    9:08 AM  In your present state of health, do you have any difficulty performing the following activities:  Hearing? 0  Vision? 0  Difficulty concentrating or making decisions? 0  Walking or climbing stairs? 1  Comment SOBOE. Paces self with activity.  Dressing or bathing? 0  Doing errands, shopping? 0  Preparing Food and eating ? N  Using the Toilet? N  In the past six months, have you accidently leaked urine? Y  Comment Managed with daily pad  Do you have problems with loss of bowel control? N  Managing your Medications? N  Managing your Finances? N  Housekeeping or managing your Housekeeping? N    Patient Care Team: Leone Haven, MD as PCP - General (Family Medicine)  Indicate any recent Medical Services you may have received from other than [facility] providers in the past year (date may be approximate).     Assessment:   This is a routine wellness examination for [facility].  Hearing/Vision screen Hearing Screening - Comments:: Patient is able to hear conversational tones without difficulty. No issues reported.           Vision Screening - Comments:: Wears corrective lenses. They have seen their ophthalmologist in the last 12 months.   Dietary issues and exercise activities discussed: Current Exercise Habits: Home exercise routine, Intensity: Mild Regular diet Fair water intake   Goals Addressed             This Visit's Progress    COMPLETED: Advanced Directive       Discuss with doctor as needed     Maintain Healthy Lifestyle       Walk 2 miles daily Healthy diet Stay hydrated        Depression Screen    10/03/2021    9:14 AM 07/23/2021   12:55 PM 10/02/2020    9:25 AM 08/30/2020    8:56 AM 06/01/2020   10:21 AM 11/29/2019   12:11 PM 08/16/2019   10:32 AM  PHQ 2/9 Scores  PHQ - 2 Score 0 0 0 0 0 0 0    Fall Risk    10/03/2021    9:14 AM 07/23/2021   12:55 PM 08/30/2020    8:56 AM 06/01/2020   10:20 AM 11/29/2019   12:11 PM  Fall Risk   Falls in the past year? 0 0 0 0 0  Number falls in past yr: 0 0 0 0 0  Injury with Fall? 0 0 0    Risk for fall due to :  No Fall Risks     Follow up Falls evaluation completed Falls evaluation completed Falls evaluation completed Falls evaluation completed Falls evaluation completed    [place]: Home free of loose throw rugs in walkways, pet beds, electrical cords, etc? Yes  Adequate lighting in your home to reduce risk of falls? Yes   ASSISTIVE DEVICES UTILIZED TO PREVENT FALLS: Life alert? No  Use of a cane, walker or w/c? No  Grab bars in the bathroom? No  Shower chair or bench in shower? No  Elevated toilet seat or a handicapped toilet? No   TIMED UP AND GO: Was the test performed? Yes .  Length of time to ambulate 10 feet: 10 sec.   Gait steady and fast without use of assistive device  Cognitive Function:        10/03/2021   10:02 AM  6CIT Screen  What Year? 0 points  What  month? 0 points  What time? 0 points  Count back from 20 0 points  Months in reverse 0 points  Repeat phrase 0 points  Total Score 0 points    Immunizations Immunization History  Administered Date(s) Administered   Fluad Quad(high Dose 65+) 10/15/2018   Influenza Whole 11/10/2010   Influenza, High Dose Seasonal PF 10/10/2016   Influenza-Unspecified 10/06/2010, 11/08/2019   Pneumococcal Polysaccharide-23 10/15/2018   Pneumococcal-Unspecified 10/06/2007   Pneumococcal vaccine status: Declined,  Education has been provided regarding the importance of this vaccine but patient still  declined. Advised may receive this vaccine at local pharmacy or Health Dept. Aware to provide a copy of the vaccination record if obtained from local pharmacy or Health Dept. Verbalized acceptance and understanding.   Screening Tests Health Maintenance  Topic Date Due   INFLUENZA VACCINE  09/03/2021   Pneumonia Vaccine 59+ Years old (2 - PCV) 10/23/2021 (Originally 10/15/2019)   MAMMOGRAM  01/03/2022 (Originally 12/24/2019)   COLONOSCOPY (Pts 45-41yrs Insurance coverage will need to be confirmed)  02/03/2022   TETANUS/TDAP  02/04/2023   DEXA SCAN  Completed   Hepatitis C Screening  Completed   HPV VACCINES  Aged Out   COVID-19 Vaccine  Discontinued   Zoster Vaccines- Shingrix  Discontinued   Health Maintenance Health Maintenance Due  Topic Date Due   INFLUENZA VACCINE  09/03/2021   Mammogram- deferred per patient.   Lung Cancer Screening: (Low Dose CT Chest recommended if Age 83-80 years, 30 pack-year currently smoking OR have quit w/in 15years.) does not qualify.   Vision Screening: Recommended annual ophthalmology exams for early detection of glaucoma and other disorders of the eye.  Dental Screening: Recommended annual dental exams for proper oral hygiene  Community Resource Referral / Chronic Care Management: CRR required this visit?  No   CCM required this visit?  No      Plan:     I have personally reviewed and noted the following in the patient's chart:   Medical and social history Use of alcohol, tobacco or illicit drugs  Current medications and supplements including opioid prescriptions. Patient is not currently taking opioid prescriptions. Functional ability and status Nutritional status Physical activity Advanced directives List of other physicians Hospitalizations, surgeries, and ER visits in previous 12 months Vitals Screenings to include cognitive, depression, and falls Referrals and appointments  In addition, I have reviewed and discussed with  patient certain preventive protocols, quality metrics, and best practice recommendations. A written personalized care plan for preventive services as well as general preventive health recommendations were provided to patient.     Ashok Pall, LPN   [id number]

## 2021-10-03 NOTE — Patient Instructions (Addendum)
[name] , Thank you for taking time to come for your Medicare Wellness Visit. I appreciate your ongoing commitment to your health goals. Please review the following plan we discussed and let me know if I can assist you in the future.   These are the goals we discussed:  Goals      Maintain Healthy Lifestyle     Walk 2 miles daily Healthy diet Stay hydrated        This is a list of the screening recommended for you and due dates:  Health Maintenance  Topic Date Due   Flu Shot  09/03/2021   Pneumonia Vaccine (2 - PCV) 10/23/2021*   Mammogram  01/03/2022*   Colon Cancer Screening  02/03/2022   Tetanus Vaccine  02/04/2023   DEXA scan (bone density measurement)  Completed   Hepatitis C Screening: USPSTF Recommendation to screen - Ages 67-79 yo.  Completed   HPV Vaccine  Aged Out   COVID-19 Vaccine  Discontinued   Zoster (Shingles) Vaccine  Discontinued  *Topic was postponed. The date shown is not the original due date.    Preventive Care 27 Years and Older, Female Preventive care refers to lifestyle choices and visits with your health care provider that can promote health and wellness. What does preventive care include? A yearly physical exam. This is also called an annual well check. Dental exams once or twice a year. Routine eye exams. Ask your health care provider how often you should have your eyes checked. Personal lifestyle choices, including: Daily care of your teeth and gums. Regular physical activity. Eating a healthy diet. Avoiding tobacco and drug use. Limiting alcohol use. Practicing safe sex. Taking low-dose aspirin every day. Taking vitamin and mineral supplements as recommended by your health care provider. What happens during an annual well check? The services and screenings done by your health care provider during your annual well check will depend on your age, overall health, lifestyle risk factors, and family history of disease. Counseling  Your  health care provider may ask you questions about your: Alcohol use. Tobacco use. Drug use. Emotional well-being. Home and relationship well-being. Sexual activity. Eating habits. History of falls. Memory and ability to understand (cognition). Work and work Astronomer. Reproductive health. Screening  You may have the following tests or measurements: Height, weight, and BMI. Blood pressure. Lipid and cholesterol levels. These may be checked every 5 years, or more frequently if you are over 69 years old. Skin check. Lung cancer screening. You may have this screening every year starting at age 58 if you have a 30-pack-year history of smoking and currently smoke or have quit within the past 15 years. Fecal occult blood test (FOBT) of the stool. You may have this test every year starting at age 67. Flexible sigmoidoscopy or colonoscopy. You may have a sigmoidoscopy every 5 years or a colonoscopy every 10 years starting at age 78. Hepatitis C blood test. Hepatitis B blood test. Sexually transmitted disease (STD) testing. Diabetes screening. This is done by checking your blood sugar (glucose) after you have not eaten for a while (fasting). You may have this done every 1-3 years. Bone density scan. This is done to screen for osteoporosis. You may have this done starting at age 81. Mammogram. This may be done every 1-2 years. Talk to your health care provider about how often you should have regular mammograms. Talk with your health care provider about your test results, treatment options, and if necessary, the need for more tests.  Vaccines  Your health care provider may recommend certain vaccines, such as: Influenza vaccine. This is recommended every year. Tetanus, diphtheria, and acellular pertussis (Tdap, Td) vaccine. You may need a Td booster every 10 years. Zoster vaccine. You may need this after age 45. Pneumococcal 13-valent conjugate (PCV13) vaccine. One dose is recommended after age  45. Pneumococcal polysaccharide (PPSV23) vaccine. One dose is recommended after age 89. Talk to your health care provider about which screenings and vaccines you need and how often you need them. This information is not intended to replace advice given to you by your health care provider. Make sure you discuss any questions you have with your health care provider. Document Released: 02/16/2015 Document Revised: 10/10/2015 Document Reviewed: 11/21/2014 Elsevier Interactive Patient Education  2017 [place] Prevention in the Home Falls can cause injuries. They can happen to people of all ages. There are many things you can do to make your home safe and to help prevent falls. What can I do on the outside of my home? Regularly fix the edges of walkways and driveways and fix any cracks. Remove anything that might make you trip as you walk through a door, such as a raised step or threshold. Trim any bushes or trees on the path to your home. Use bright outdoor lighting. Clear any walking paths of anything that might make someone trip, such as rocks or tools. Regularly check to see if handrails are loose or broken. Make sure that both sides of any steps have handrails. Any raised decks and porches should have guardrails on the edges. Have any leaves, snow, or ice cleared regularly. Use sand or salt on walking paths during winter. Clean up any spills in your garage right away. This includes oil or grease spills. What can I do in the bathroom? Use night lights. Install grab bars by the toilet and in the tub and shower. Do not use towel bars as grab bars. Use non-skid mats or decals in the tub or shower. If you need to sit down in the shower, use a plastic, non-slip stool. Keep the floor dry. Clean up any water that spills on the floor as soon as it happens. Remove soap buildup in the tub or shower regularly. Attach bath mats securely with double-sided non-slip rug tape. Do not have throw  rugs and other things on the floor that can make you trip. What can I do in the bedroom? Use night lights. Make sure that you have a light by your bed that is easy to reach. Do not use any sheets or blankets that are too big for your bed. They should not hang down onto the floor. Have a firm chair that has side arms. You can use this for support while you get dressed. Do not have throw rugs and other things on the floor that can make you trip. What can I do in the kitchen? Clean up any spills right away. Avoid walking on wet floors. Keep items that you use a lot in easy-to-reach places. If you need to reach something above you, use a strong step stool that has a grab bar. Keep electrical cords out of the way. Do not use floor polish or wax that makes floors slippery. If you must use wax, use non-skid floor wax. Do not have throw rugs and other things on the floor that can make you trip. What can I do with my stairs? Do not leave any items on the stairs. Make sure that  there are handrails on both sides of the stairs and use them. Fix handrails that are broken or loose. Make sure that handrails are as long as the stairways. Check any carpeting to make sure that it is firmly attached to the stairs. Fix any carpet that is loose or worn. Avoid having throw rugs at the top or bottom of the stairs. If you do have throw rugs, attach them to the floor with carpet tape. Make sure that you have a light switch at the top of the stairs and the bottom of the stairs. If you do not have them, ask someone to add them for you. What else can I do to help prevent falls? Wear shoes that: Do not have high heels. Have rubber bottoms. Are comfortable and fit you well. Are closed at the toe. Do not wear sandals. If you use a stepladder: Make sure that it is fully opened. Do not climb a closed stepladder. Make sure that both sides of the stepladder are locked into place. Ask someone to hold it for you, if  possible. Clearly mark and make sure that you can see: Any grab bars or handrails. First and last steps. Where the edge of each step is. Use tools that help you move around (mobility aids) if they are needed. These include: Canes. Walkers. Scooters. Crutches. Turn on the lights when you go into a dark area. Replace any light bulbs as soon as they burn out. Set up your furniture so you have a clear path. Avoid moving your furniture around. If any of your floors are uneven, fix them. If there are any pets around you, be aware of where they are. Review your medicines with your doctor. Some medicines can make you feel dizzy. This can increase your chance of falling. Ask your doctor what other things that you can do to help prevent falls. This information is not intended to replace advice given to you by your health care provider. Make sure you discuss any questions you have with your health care provider. Document Released: 11/16/2008 Document Revised: 06/28/2015 Document Reviewed: 02/24/2014 Elsevier Interactive Patient Education  2017 [employer].

## 2021-10-09 DIAGNOSIS — J209 Acute bronchitis, unspecified: Secondary | ICD-10-CM | POA: Diagnosis not present

## 2021-10-09 DIAGNOSIS — Z20822 Contact with and (suspected) exposure to covid-19: Secondary | ICD-10-CM | POA: Diagnosis not present

## 2021-10-16 ENCOUNTER — Ambulatory Visit: Payer: BC Managed Care – PPO | Admitting: Urology

## 2021-10-29 ENCOUNTER — Ambulatory Visit (INDEPENDENT_AMBULATORY_CARE_PROVIDER_SITE_OTHER): Payer: BC Managed Care – PPO | Admitting: Family Medicine

## 2021-10-29 ENCOUNTER — Encounter: Payer: Self-pay | Admitting: Family Medicine

## 2021-10-29 VITALS — BP 190/80 | HR 64 | Temp 98.5°F | Ht 69.0 in | Wt 211.2 lb

## 2021-10-29 DIAGNOSIS — I151 Hypertension secondary to other renal disorders: Secondary | ICD-10-CM | POA: Diagnosis not present

## 2021-10-29 DIAGNOSIS — I4891 Unspecified atrial fibrillation: Secondary | ICD-10-CM | POA: Diagnosis not present

## 2021-10-29 DIAGNOSIS — N2889 Other specified disorders of kidney and ureter: Secondary | ICD-10-CM | POA: Diagnosis not present

## 2021-10-29 DIAGNOSIS — Z23 Encounter for immunization: Secondary | ICD-10-CM

## 2021-10-29 DIAGNOSIS — J4520 Mild intermittent asthma, uncomplicated: Secondary | ICD-10-CM | POA: Diagnosis not present

## 2021-10-29 LAB — COMPREHENSIVE METABOLIC PANEL
ALT: 61 U/L — ABNORMAL HIGH (ref 0–35)
AST: 44 U/L — ABNORMAL HIGH (ref 0–37)
Albumin: 3.6 g/dL (ref 3.5–5.2)
Alkaline Phosphatase: 83 U/L (ref 39–117)
BUN: 19 mg/dL (ref 6–23)
CO2: 26 mEq/L (ref 19–32)
Calcium: 8.6 mg/dL (ref 8.4–10.5)
Chloride: 109 mEq/L (ref 96–112)
Creatinine, Ser: 1.22 mg/dL — ABNORMAL HIGH (ref 0.40–1.20)
GFR: 44.55 mL/min — ABNORMAL LOW (ref >60.00)
Glucose, Bld: 96 mg/dL (ref 70–99)
Potassium: 4 mEq/L (ref 3.5–5.1)
Sodium: 142 mEq/L (ref 135–145)
Total Bilirubin: 0.6 mg/dL (ref 0.2–1.2)
Total Protein: 5.9 g/dL — ABNORMAL LOW (ref 6.0–8.3)

## 2021-10-29 LAB — CBC
HCT: 30.6 % — ABNORMAL LOW (ref 36.0–46.0)
Hemoglobin: 9.9 g/dL — ABNORMAL LOW (ref 12.0–15.0)
MCHC: 32.4 g/dL (ref 30.0–36.0)
MCV: 82.6 fl (ref 78.0–100.0)
Platelets: 202 10*3/uL (ref 150.0–400.0)
RBC: 3.71 Mil/uL — ABNORMAL LOW (ref 3.87–5.11)
RDW: 14.9 % (ref 11.5–15.5)
WBC: 4.9 10*3/uL (ref 4.0–10.5)

## 2021-10-29 LAB — TSH: TSH: 5.63 u[IU]/mL — ABNORMAL HIGH (ref 0.35–5.50)

## 2021-10-29 MED ORDER — VALSARTAN 80 MG PO TABS: 80.0000 mg | ORAL_TABLET | Freq: Every day | ORAL | 3 refills | Status: DC

## 2021-10-29 NOTE — Progress Notes (Signed)
Tommi Rumps, MD Phone: [phone]  Audrey Wolf is a 71 y.o. female who presents today for f/u.  HYPERTENSION Disease Monitoring Home BP Monitoring BP has increased in the past 2 weeks, up to 226/80 Chest pain- no    Dyspnea- some with her recent respiratory illness, though has improved Medications Compliance-  taking coreg, diltiazem.   Edema- no NO headaches or vision changes.  BMET    Component Value Date/Time   NA 138 09/22/2020 0528   NA 144 08/19/2019 1533   NA 140 03/01/2012 1132   K 3.9 09/22/2020 0528   K 3.5 03/01/2012 1132   CL 106 09/22/2020 0528   CL 108 (H) 03/01/2012 1132   CO2 24 09/22/2020 0528   CO2 23 03/01/2012 1132   GLUCOSE 97 09/22/2020 0528   GLUCOSE 92 03/01/2012 1132   BUN 25 (H) 09/22/2020 0528   BUN 20 08/19/2019 1533   BUN 22 (H) 03/01/2012 1132   CREATININE 1.30 (H) 09/22/2020 0528   CREATININE 1.04 (H) 11/12/2018 1457   CALCIUM 9.0 09/22/2020 0528   CALCIUM 8.6 03/01/2012 1132   GFRNONAA 44 (L) 09/22/2020 0528   GFRNONAA >60 03/01/2012 1132   GFRAA 67 08/19/2019 1533   GFRAA >60 03/01/2012 1132   A-fib: Patient is taking Eliquis, amiodarone, carvedilol, and diltiazem.  No change to intermittent palpitations.  No bleeding issues.  Asthma/respiratory illness: Patient notes 3 weeks ago she got sick with cough and shortness of breath and wheezing.  She was seen at urgent care and treated for bronchitis with prednisone, amoxicillin, and albuterol.  She notes she has progressively been improving though is still coughing.  She has had some intermittent wheezing.  She uses her Symbicort 2 times every other day. She has noted some discomfort in her chest when she coughs though no other chest discomfort.   Social History   Tobacco Use  Smoking Status Never  Smokeless Tobacco Never    Current Outpatient Medications on File Prior to Visit  Medication Sig Dispense Refill   acetaminophen (TYLENOL) 500 MG tablet Take 1 tablet (500 mg  total) by mouth daily as needed for mild pain or headache. 30 tablet 0   budesonide-formoterol (SYMBICORT) 80-4.5 MCG/ACT inhaler Inhale 2 puffs into the lungs 2 (two) times daily. 1 each 3   carvedilol (COREG) 25 MG tablet Take 1 tablet (25 mg total) by mouth 2 (two) times daily with a meal. 180 tablet 0   famotidine (PEPCID) 20 MG tablet Take 20 mg by mouth 2 (two) times daily.     loratadine (CLARITIN) 10 MG tablet Take 10 mg by mouth daily.      Multiple Vitamins-Minerals (PRESERVISION AREDS 2) CAPS Take 2 capsules by mouth daily.     rosuvastatin (CRESTOR) 20 MG tablet Take 1 tablet (20 mg total) by mouth daily. 90 tablet 0   tolterodine (DETROL LA) 4 MG 24 hr capsule Take 1 capsule (4 mg total) by mouth daily. 30 capsule 11   albuterol (VENTOLIN HFA) 108 (90 Base) MCG/ACT inhaler SMARTSIG:2 Puff(s) By Mouth Every 4-6 Hours PRN     amiodarone (PACERONE) 400 MG tablet Take 1 tablet (400 mg total) by mouth 2 (two) times daily. 60 tablet 0   apixaban (ELIQUIS) 5 MG TABS tablet Take 1 tablet (5 mg total) by mouth 2 (two) times daily. 60 tablet 2   diltiazem (CARDIZEM) 30 MG tablet Take 1 tablet (30 mg total) by mouth every 6 (six) hours. 120 tablet 2   No  current facility-administered medications on file prior to visit.     ROS see history of present illness  Objective  Physical Exam Vitals:   10/29/21 0937  BP: (!) 190/80  Pulse: 64  Temp: 98.5 F (36.9 C)  SpO2: 97%    BP Readings from Last 3 Encounters:  10/29/21 (!) 190/80  10/03/21 (!) 144/70  07/29/21 (!) 209/105   Wt Readings from Last 3 Encounters:  10/29/21 211 lb 3.2 oz (95.8 kg)  10/03/21 214 lb 3.2 oz (97.2 kg)  07/29/21 200 lb (90.7 kg)    Physical Exam Constitutional:      General: She is not in acute distress.    Appearance: She is not diaphoretic.  Cardiovascular:     Rate and Rhythm: Normal rate and regular rhythm.     Heart sounds: Normal heart sounds.  Pulmonary:     Effort: Pulmonary effort is  normal.     Breath sounds: Normal breath sounds.  Musculoskeletal:     Right lower leg: No edema.     Left lower leg: No edema.  Skin:    General: Skin is warm and dry.  Neurological:     Mental Status: She is alert.      Assessment/Plan: Please see individual problem list.  Problem List Items Addressed This Visit     Asthma (Chronic)    Recent flareup with an episode of bronchitis.  I encouraged her to use her Symbicort 2 puffs twice daily.  She can continue as needed albuterol.  Discussed her cough would likely go away within the next week to week and a half and if it did not we could consider chest x-ray.  If she has any worsening symptoms she will let us know.      Relevant Medications   albuterol (VENTOLIN HFA) 108 (90 Base) MCG/ACT inhaler   Atrial fibrillation (HCC) (Chronic)    Seems to be in sinus rhythm today.  She will continue carvedilol 25 mg twice daily, diltiazem 30 mg every 6 hours, and Eliquis 5 mg twice daily.  Amiodarone is managed by cardiology.      Relevant Medications   valsartan (DIOVAN) 80 MG tablet   Hypertension - Primary (Chronic)    Uncontrolled.  We will add valsartan 80 mg daily.  She will continue carvedilol 25 mg twice daily and diltiazem 30 mg every 6 hours.  She will return in 1 week for labs and follow-up with me.  Discussed if she develops headaches, vision changes, numbness, weakness, chest pain, shortness of breath, diaphoresis, or any new symptoms with her elevated blood pressure she needs to go to the emergency department.  Lab work as outlined.      Relevant Medications   valsartan (DIOVAN) 80 MG tablet   Other Relevant Orders   Comp Met (CMET)   TSH   CBC   Other Visit Diagnoses     Need for immunization against influenza       Relevant Orders   Flu Vaccine QUAD High Dose(Fluad) (Completed)       Return in about 1 week (around 11/05/2021) for [place] for 15-minute visit if needed, follow-up visit for blood pressure with  PCP.   Tommi Rumps, MD [place]

## 2021-10-29 NOTE — Assessment & Plan Note (Signed)
Uncontrolled.  We will add valsartan 80 mg daily.  She will continue carvedilol 25 mg twice daily and diltiazem 30 mg every 6 hours.  She will return in 1 week for labs and follow-up with me.  Discussed if she develops headaches, vision changes, numbness, weakness, chest pain, shortness of breath, diaphoresis, or any new symptoms with her elevated blood pressure she needs to go to the emergency department.  Lab work as outlined.

## 2021-10-29 NOTE — Assessment & Plan Note (Signed)
Seems to be in sinus rhythm today.  She will continue carvedilol 25 mg twice daily, diltiazem 30 mg every 6 hours, and Eliquis 5 mg twice daily.  Amiodarone is managed by cardiology.

## 2021-10-29 NOTE — Assessment & Plan Note (Signed)
Recent flareup with an episode of bronchitis.  I encouraged her to use her Symbicort 2 puffs twice daily.  She can continue as needed albuterol.  Discussed her cough would likely go away within the next week to week and a half and if it did not we could consider chest x-ray.  If she has any worsening symptoms she will let us know.

## 2021-10-29 NOTE — Patient Instructions (Signed)
Nice to see you. If your cough is not resolving over the next week to week and a half please let us know. If you develop chest pain, shortness of breath, sweating, headaches, vision changes, or any new symptoms with your elevated blood pressure please go to the emergency room. We are starting you on valsartan 80 mg once daily for your blood pressure.  If you notice any potential side effects with this please let us know.

## 2021-10-30 ENCOUNTER — Other Ambulatory Visit: Payer: Self-pay | Admitting: Family Medicine

## 2021-10-30 DIAGNOSIS — D649 Anemia, unspecified: Secondary | ICD-10-CM

## 2021-10-30 DIAGNOSIS — R7989 Other specified abnormal findings of blood chemistry: Secondary | ICD-10-CM

## 2021-11-05 DIAGNOSIS — N1832 Chronic kidney disease, stage 3b: Secondary | ICD-10-CM | POA: Diagnosis not present

## 2021-11-06 ENCOUNTER — Ambulatory Visit (INDEPENDENT_AMBULATORY_CARE_PROVIDER_SITE_OTHER): Payer: BC Managed Care – PPO | Admitting: Family Medicine

## 2021-11-06 ENCOUNTER — Encounter: Payer: Self-pay | Admitting: Family Medicine

## 2021-11-06 VITALS — BP 138/60 | HR 57 | Temp 98.6°F | Ht 69.0 in | Wt 212.6 lb

## 2021-11-06 DIAGNOSIS — D649 Anemia, unspecified: Secondary | ICD-10-CM | POA: Diagnosis not present

## 2021-11-06 DIAGNOSIS — I151 Hypertension secondary to other renal disorders: Secondary | ICD-10-CM | POA: Diagnosis not present

## 2021-11-06 LAB — IBC + FERRITIN
Ferritin: 18 ng/mL (ref 10.0–291.0)
Iron: 20 ug/dL — ABNORMAL LOW (ref 42–145)
Saturation Ratios: 5.2 % — ABNORMAL LOW (ref 20.0–50.0)
TIBC: 387.8 ug/dL (ref 250.0–450.0)
Transferrin: 277 mg/dL (ref 212.0–360.0)

## 2021-11-06 NOTE — Progress Notes (Signed)
[name], MD Phone: [phone]  Audrey Wolf is a 71 y.o. female who presents today for f/u.  HYPERTENSION Disease Monitoring Home BP Monitoring normal diastolic though notes her systolic goes up some to the 170s, saw nephrology yesterday and BP was adequately controlled Chest pain- no    Dyspnea- no Medications Compliance-  taking coreg, diltiazem, losartan, last week we discussed adding valsartan though I did not know that the patient was taking losartan as it was not on her reconciled med list in the chart  Edema- no BMET    Component Value Date/Time   NA 142 10/29/2021 0950   NA 144 08/19/2019 1533   NA 140 03/01/2012 1132   K 4.0 10/29/2021 0950   K 3.5 03/01/2012 1132   CL 109 10/29/2021 0950   CL 108 (H) 03/01/2012 1132   CO2 26 10/29/2021 0950   CO2 23 03/01/2012 1132   GLUCOSE 96 10/29/2021 0950   GLUCOSE 92 03/01/2012 1132   BUN 19 10/29/2021 0950   BUN 20 08/19/2019 1533   BUN 22 (H) 03/01/2012 1132   CREATININE 1.22 (H) 10/29/2021 0950   CREATININE 1.04 (H) 11/12/2018 1457   CALCIUM 8.6 10/29/2021 0950   CALCIUM 8.6 03/01/2012 1132   GFRNONAA 44 (L) 09/22/2020 0528   GFRNONAA >60 03/01/2012 1132   GFRAA 67 08/19/2019 1533   GFRAA >60 03/01/2012 1132   Anemia: Noted to be anemic on her last lab check with [place].  She reports that she had a bleeding episode after breaking a tooth where she had blood gushing from her mouth.  She had to go to the emergency room to get this stopped.  Social History   Tobacco Use  Smoking Status Never  Smokeless Tobacco Never    Current Outpatient Medications on File Prior to Visit  Medication Sig Dispense Refill   acetaminophen (TYLENOL) 500 MG tablet Take 1 tablet (500 mg total) by mouth daily as needed for mild pain or headache. 30 tablet 0   albuterol (VENTOLIN HFA) 108 (90 Base) MCG/ACT inhaler SMARTSIG:2 Puff(s) By Mouth Every 4-6 Hours PRN     budesonide-formoterol (SYMBICORT) 80-4.5 MCG/ACT inhaler Inhale 2  puffs into the lungs 2 (two) times daily. 1 each 3   carvedilol (COREG) 25 MG tablet Take 1 tablet (25 mg total) by mouth 2 (two) times daily with a meal. 180 tablet 0   famotidine (PEPCID) 20 MG tablet Take 20 mg by mouth 2 (two) times daily.     loratadine (CLARITIN) 10 MG tablet Take 10 mg by mouth daily.      losartan (COZAAR) 25 MG tablet Take 25 mg by mouth daily.     Multiple Vitamins-Minerals (PRESERVISION AREDS 2) CAPS Take 2 capsules by mouth daily.     rosuvastatin (CRESTOR) 20 MG tablet Take 1 tablet (20 mg total) by mouth daily. 90 tablet 0   tolterodine (DETROL LA) 4 MG 24 hr capsule Take 1 capsule (4 mg total) by mouth daily. 30 capsule 11   amiodarone (PACERONE) 400 MG tablet Take 1 tablet (400 mg total) by mouth 2 (two) times daily. 60 tablet 0   apixaban (ELIQUIS) 5 MG TABS tablet Take 1 tablet (5 mg total) by mouth 2 (two) times daily. 60 tablet 2   diltiazem (CARDIZEM) 30 MG tablet Take 1 tablet (30 mg total) by mouth every 6 (six) hours. 120 tablet 2   No current facility-administered medications on file prior to visit.     ROS see history of  present illness  Objective  Physical Exam Vitals:   11/06/21 1341  BP: 138/60  Pulse: (!) 57  Temp: 98.6 F (37 C)  SpO2: 97%    BP Readings from Last 3 Encounters:  11/06/21 138/60  10/29/21 (!) 190/80  10/03/21 (!) 144/70   Wt Readings from Last 3 Encounters:  11/06/21 212 lb 9.6 oz (96.4 kg)  10/29/21 211 lb 3.2 oz (95.8 kg)  10/03/21 214 lb 3.2 oz (97.2 kg)    Physical Exam Constitutional:      General: She is not in acute distress.    Appearance: She is not diaphoretic.  Cardiovascular:     Rate and Rhythm: Normal rate and regular rhythm.     Heart sounds: Normal heart sounds.  Pulmonary:     Effort: Pulmonary effort is normal.     Breath sounds: Normal breath sounds.  Skin:    General: Skin is warm and dry.  Neurological:     Mental Status: She is alert.      Assessment/Plan: Please see  individual problem list.  Problem List Items Addressed This Visit     Hypertension (Chronic)    Improved.  I was not previously aware that she is on losartan.  We will get this added to her medication list.  We will remove valsartan as she should not be on this while she is on losartan.  She will continue carvedilol 25 mg twice daily and diltiazem 30 mg every 6 hours.  She will continue to monitor blood pressure.      Relevant Medications   losartan (COZAAR) 25 MG tablet   Anemia - Primary    Possibly related to the mouth bleeding she had previously.  Hemoglobin was relatively stable yesterday when checked by nephrology.  We will check iron studies today.      Relevant Orders   IBC + Ferritin     Return in about 3 months (around 02/06/2022).   Tommi Rumps, MD [place]

## 2021-11-06 NOTE — Assessment & Plan Note (Signed)
Possibly related to the mouth bleeding she had previously.  Hemoglobin was relatively stable yesterday when checked by nephrology.  We will check iron studies today.

## 2021-11-06 NOTE — Assessment & Plan Note (Signed)
Improved.  I was not previously aware that she is on losartan.  We will get this added to her medication list.  We will remove valsartan as she should not be on this while she is on losartan.  She will continue carvedilol 25 mg twice daily and diltiazem 30 mg every 6 hours.  She will continue to monitor blood pressure.

## 2021-11-08 ENCOUNTER — Other Ambulatory Visit: Payer: Self-pay | Admitting: Family Medicine

## 2021-11-08 DIAGNOSIS — E611 Iron deficiency: Secondary | ICD-10-CM

## 2021-11-08 MED ORDER — FERROUS SULFATE 325 (65 FE) MG PO TABS: 325.0000 mg | ORAL_TABLET | Freq: Every day | ORAL | 1 refills | Status: DC

## 2021-11-12 ENCOUNTER — Other Ambulatory Visit: Payer: Self-pay

## 2021-11-12 DIAGNOSIS — E611 Iron deficiency: Secondary | ICD-10-CM

## 2021-11-16 ENCOUNTER — Emergency Department
Admission: EM | Admit: 2021-11-16 | Discharge: 2021-11-16 | Disposition: A | Discharge status: Home / Self Care | Payer: BC Managed Care – PPO | Source: Home / Self Care

## 2021-11-16 ENCOUNTER — Other Ambulatory Visit: Payer: Self-pay

## 2021-11-16 ENCOUNTER — Emergency Department: Payer: BC Managed Care – PPO

## 2021-11-16 DIAGNOSIS — J4520 Mild intermittent asthma, uncomplicated: Secondary | ICD-10-CM | POA: Diagnosis not present

## 2021-11-16 DIAGNOSIS — J45909 Unspecified asthma, uncomplicated: Secondary | ICD-10-CM | POA: Insufficient documentation

## 2021-11-16 DIAGNOSIS — E785 Hyperlipidemia, unspecified: Secondary | ICD-10-CM | POA: Diagnosis not present

## 2021-11-16 DIAGNOSIS — N1832 Chronic kidney disease, stage 3b: Secondary | ICD-10-CM | POA: Diagnosis not present

## 2021-11-16 DIAGNOSIS — R0602 Shortness of breath: Secondary | ICD-10-CM | POA: Diagnosis not present

## 2021-11-16 DIAGNOSIS — E861 Hypovolemia: Secondary | ICD-10-CM | POA: Diagnosis not present

## 2021-11-16 DIAGNOSIS — I5032 Chronic diastolic (congestive) heart failure: Secondary | ICD-10-CM | POA: Diagnosis not present

## 2021-11-16 DIAGNOSIS — I38 Endocarditis, valve unspecified: Secondary | ICD-10-CM | POA: Diagnosis not present

## 2021-11-16 DIAGNOSIS — R7881 Bacteremia: Secondary | ICD-10-CM | POA: Diagnosis not present

## 2021-11-16 DIAGNOSIS — R112 Nausea with vomiting, unspecified: Secondary | ICD-10-CM | POA: Diagnosis not present

## 2021-11-16 DIAGNOSIS — I2510 Atherosclerotic heart disease of native coronary artery without angina pectoris: Secondary | ICD-10-CM | POA: Diagnosis not present

## 2021-11-16 DIAGNOSIS — I509 Heart failure, unspecified: Secondary | ICD-10-CM | POA: Insufficient documentation

## 2021-11-16 DIAGNOSIS — R531 Weakness: Secondary | ICD-10-CM | POA: Diagnosis not present

## 2021-11-16 DIAGNOSIS — D61818 Other pancytopenia: Secondary | ICD-10-CM | POA: Diagnosis not present

## 2021-11-16 DIAGNOSIS — Z7901 Long term (current) use of anticoagulants: Secondary | ICD-10-CM | POA: Insufficient documentation

## 2021-11-16 DIAGNOSIS — R7989 Other specified abnormal findings of blood chemistry: Secondary | ICD-10-CM | POA: Diagnosis not present

## 2021-11-16 DIAGNOSIS — E872 Acidosis, unspecified: Secondary | ICD-10-CM | POA: Diagnosis not present

## 2021-11-16 DIAGNOSIS — K7200 Acute and subacute hepatic failure without coma: Secondary | ICD-10-CM | POA: Diagnosis not present

## 2021-11-16 DIAGNOSIS — M1990 Unspecified osteoarthritis, unspecified site: Secondary | ICD-10-CM | POA: Diagnosis not present

## 2021-11-16 DIAGNOSIS — N189 Chronic kidney disease, unspecified: Secondary | ICD-10-CM | POA: Diagnosis not present

## 2021-11-16 DIAGNOSIS — B954 Other streptococcus as the cause of diseases classified elsewhere: Secondary | ICD-10-CM | POA: Diagnosis not present

## 2021-11-16 DIAGNOSIS — R069 Unspecified abnormalities of breathing: Secondary | ICD-10-CM | POA: Diagnosis not present

## 2021-11-16 DIAGNOSIS — N179 Acute kidney failure, unspecified: Secondary | ICD-10-CM | POA: Diagnosis not present

## 2021-11-16 DIAGNOSIS — R7303 Prediabetes: Secondary | ICD-10-CM | POA: Diagnosis not present

## 2021-11-16 DIAGNOSIS — K089 Disorder of teeth and supporting structures, unspecified: Secondary | ICD-10-CM | POA: Diagnosis not present

## 2021-11-16 DIAGNOSIS — E860 Dehydration: Secondary | ICD-10-CM | POA: Diagnosis not present

## 2021-11-16 DIAGNOSIS — B955 Unspecified streptococcus as the cause of diseases classified elsewhere: Secondary | ICD-10-CM | POA: Diagnosis not present

## 2021-11-16 DIAGNOSIS — R945 Abnormal results of liver function studies: Secondary | ICD-10-CM | POA: Diagnosis not present

## 2021-11-16 DIAGNOSIS — R55 Syncope and collapse: Secondary | ICD-10-CM | POA: Diagnosis not present

## 2021-11-16 DIAGNOSIS — U071 COVID-19: Secondary | ICD-10-CM

## 2021-11-16 DIAGNOSIS — R051 Acute cough: Secondary | ICD-10-CM | POA: Diagnosis not present

## 2021-11-16 DIAGNOSIS — B179 Acute viral hepatitis, unspecified: Secondary | ICD-10-CM | POA: Diagnosis not present

## 2021-11-16 DIAGNOSIS — I480 Paroxysmal atrial fibrillation: Secondary | ICD-10-CM | POA: Diagnosis not present

## 2021-11-16 DIAGNOSIS — I130 Hypertensive heart and chronic kidney disease with heart failure and stage 1 through stage 4 chronic kidney disease, or unspecified chronic kidney disease: Secondary | ICD-10-CM | POA: Diagnosis not present

## 2021-11-16 DIAGNOSIS — N261 Atrophy of kidney (terminal): Secondary | ICD-10-CM | POA: Diagnosis not present

## 2021-11-16 DIAGNOSIS — I959 Hypotension, unspecified: Secondary | ICD-10-CM | POA: Diagnosis not present

## 2021-11-16 DIAGNOSIS — K824 Cholesterolosis of gallbladder: Secondary | ICD-10-CM | POA: Diagnosis not present

## 2021-11-16 DIAGNOSIS — R109 Unspecified abdominal pain: Secondary | ICD-10-CM | POA: Diagnosis not present

## 2021-11-16 LAB — URINALYSIS, ROUTINE W REFLEX MICROSCOPIC
Bilirubin Urine: NEGATIVE
Glucose, UA: NEGATIVE mg/dL
Hgb urine dipstick: NEGATIVE
Ketones, ur: NEGATIVE mg/dL
Leukocytes,Ua: NEGATIVE
Nitrite: NEGATIVE
Protein, ur: 100 mg/dL — AB
Specific Gravity, Urine: 1.017 (ref 1.005–1.030)
pH: 6 (ref 5.0–8.0)

## 2021-11-16 LAB — BASIC METABOLIC PANEL
Anion gap: 10 (ref 5–15)
BUN: 18 mg/dL (ref 8–23)
CO2: 23 mmol/L (ref 22–32)
Calcium: 8.6 mg/dL — ABNORMAL LOW (ref 8.9–10.3)
Chloride: 106 mmol/L (ref 98–111)
Creatinine, Ser: 1.39 mg/dL — ABNORMAL HIGH (ref 0.44–1.00)
GFR, Estimated: 41 mL/min — ABNORMAL LOW (ref >60)
Glucose, Bld: 118 mg/dL — ABNORMAL HIGH (ref 70–99)
Potassium: 3.9 mmol/L (ref 3.5–5.1)
Sodium: 139 mmol/L (ref 135–145)

## 2021-11-16 LAB — CBC WITH DIFFERENTIAL/PLATELET
Abs Immature Granulocytes: 0.05 10*3/uL (ref 0.00–0.07)
Basophils Absolute: 0 10*3/uL (ref 0.0–0.1)
Basophils Relative: 0 %
Eosinophils Absolute: 0 10*3/uL (ref 0.0–0.5)
Eosinophils Relative: 0 %
HCT: 34.9 % — ABNORMAL LOW (ref 36.0–46.0)
Hemoglobin: 10.7 g/dL — ABNORMAL LOW (ref 12.0–15.0)
Immature Granulocytes: 1 %
Lymphocytes Relative: 8 %
Lymphs Abs: 0.7 10*3/uL (ref 0.7–4.0)
MCH: 25.4 pg — ABNORMAL LOW (ref 26.0–34.0)
MCHC: 30.7 g/dL (ref 30.0–36.0)
MCV: 82.7 fL (ref 80.0–100.0)
Monocytes Absolute: 0.6 10*3/uL (ref 0.1–1.0)
Monocytes Relative: 7 %
Neutro Abs: 7.4 10*3/uL (ref 1.7–7.7)
Neutrophils Relative %: 84 %
Platelets: 189 10*3/uL (ref 150–400)
RBC: 4.22 MIL/uL (ref 3.87–5.11)
RDW: 14.6 % (ref 11.5–15.5)
WBC: 8.8 10*3/uL (ref 4.0–10.5)
nRBC: 0 % (ref 0.0–0.2)

## 2021-11-16 LAB — RESP PANEL BY RT-PCR (FLU A&B, COVID) ARPGX2
Influenza A by PCR: NEGATIVE
Influenza B by PCR: NEGATIVE
SARS Coronavirus 2 by RT PCR: POSITIVE — AB

## 2021-11-16 MED ORDER — GUAIFENESIN ER 600 MG PO TB12: 600.0000 mg | ORAL_TABLET | Freq: Two times a day (BID) | ORAL | 0 refills | Status: DC

## 2021-11-16 MED ORDER — ACETAMINOPHEN 500 MG PO TABS
1000.0000 mg | ORAL_TABLET | Freq: Once | ORAL | Status: AC
  Administered 2021-11-16: 1000 mg via ORAL
  Filled 2021-11-16: qty 2

## 2021-11-16 NOTE — ED Triage Notes (Signed)
Pt states that she started with headache and cough yesterday- pt recently had bronchitis- pt has been having increased urinary frequency- pt has also been dry heaving

## 2021-11-16 NOTE — ED Provider Triage Note (Signed)
  Emergency Medicine Provider Triage Evaluation Note  Audrey Wolf , a 71 y.o.female,  was evaluated in triage.  Pt complains of headache, cough, and body aches x2 days.  Patient states that started with a headache, which she attributed to her normal migraines, however then transition into fevers and myalgias.  Denies any recent sick contacts.  Endorses some shortness of breath as well.   Review of Systems  Positive: Headache, shortness of breath, cough, body aches Negative: Denies fever, chest pain, vomiting  Physical Exam  There were no vitals filed for this visit. Gen:   Awake, no distress   Resp:  Normal effort  MSK:   Moves extremities without difficulty  Other:    Medical Decision Making  Given the patient's initial medical screening exam, the following diagnostic evaluation has been ordered. The patient will be placed in the appropriate treatment space, once one is available, to complete the evaluation and treatment. I have discussed the plan of care with the patient and I have advised the patient that an ED physician or mid-level practitioner will reevaluate their condition after the test results have been received, as the results may give them additional insight into the type of treatment they may need.    Diagnostics: Labs, CXR, respiratory panel.  Treatments: none immediately   Teodoro Spray, [place] 11/16/21 1435

## 2021-11-16 NOTE — ED Provider Notes (Signed)
[facility] Provider Note    None    (approximate)   History   Chief Complaint Headache   HPI  Audrey Wolf is a 71 y.o. female with past medical history of hypertension, CAD, CHF, atrial fibrillation on Eliquis, CKD, and asthma who presents to the ED complaining of cough and malaise.  Patient reports that she has been dealing with nonproductive cough, generalized weakness, malaise, and headache for the past 2 days.  She reports fevers as high as 101 at home but has not taken any Tylenol or ibuprofen.  She denies any pain in her chest or difficulty breathing, has occasionally had dry heaving but currently denies any nausea, vomiting, or diarrhea.  She is not aware of any sick contacts.      Physical Exam   Triage Vital Signs: ED Triage Vitals  Enc Vitals Group     BP 11/16/21 1456 134/73     Pulse Rate 11/16/21 1456 72     Resp 11/16/21 1456 18     Temp 11/16/21 1456 (!) 101.3 F (38.5 C)     Temp Source 11/16/21 1456 Oral     SpO2 11/16/21 1456 96 %     Weight --      Height --      Head Circumference --      Peak Flow --      Pain Score 11/16/21 1457 0     Pain Loc --      Pain Edu? --      Excl. in [place]? --     Most recent vital signs: Vitals:   11/16/21 1456  BP: 134/73  Pulse: 72  Resp: 18  Temp: (!) 101.3 F (38.5 C)  SpO2: 96%    Constitutional: Alert and oriented. Eyes: Conjunctivae are normal. Head: Atraumatic. Nose: No congestion/rhinnorhea. Mouth/Throat: Mucous membranes are moist.  Neck: Supple with no meningismus. Cardiovascular: Normal rate, regular rhythm. Grossly normal heart sounds.  2+ radial pulses bilaterally. Respiratory: Normal respiratory effort.  No retractions. Lungs CTAB. Gastrointestinal: Soft and nontender. No distention. Musculoskeletal: No lower extremity tenderness nor edema.  Neurologic:  Normal speech and language. No gross focal neurologic deficits are appreciated.    ED Results /  Procedures / Treatments   Labs (all labs ordered are listed, but only abnormal results are displayed) Labs Reviewed  RESP PANEL BY RT-PCR (FLU A&B, COVID) ARPGX2 - Abnormal; Notable for the following components:      Result Value   SARS Coronavirus 2 by RT PCR POSITIVE (*)    All other components within normal limits  CBC WITH DIFFERENTIAL/PLATELET - Abnormal; Notable for the following components:   Hemoglobin 10.7 (*)    HCT 34.9 (*)    MCH 25.4 (*)    All other components within normal limits  BASIC METABOLIC PANEL - Abnormal; Notable for the following components:   Glucose, Bld 118 (*)    Creatinine, Ser 1.39 (*)    Calcium 8.6 (*)    GFR, Estimated 41 (*)    All other components within normal limits  URINALYSIS, ROUTINE W REFLEX MICROSCOPIC - Abnormal; Notable for the following components:   Color, Urine YELLOW (*)    APPearance CLEAR (*)    Protein, ur 100 (*)    Bacteria, UA RARE (*)    All other components within normal limits   RADIOLOGY Chest x-ray reviewed and interpreted by me with no infiltrate, edema, or effusion.  PROCEDURES:  Critical Care performed: No  Procedures   MEDICATIONS ORDERED IN ED: Medications  acetaminophen (TYLENOL) tablet 1,000 mg (1,000 mg Oral Given 11/16/21 1507)     IMPRESSION / MDM / ASSESSMENT AND PLAN / ED COURSE  I reviewed the triage vital signs and the nursing notes.                              71 y.o. female with past medical history of hypertension, CAD, CHF, atrial fibrillation on Eliquis, CKD, and asthma who presents to the ED complaining of cough, malaise, weakness, and headache for the past 2 days.  Patient's presentation is most consistent with acute presentation with potential threat to life or bodily function.  Differential diagnosis includes, but is not limited to, sepsis, pneumonia, COVID-19, influenza, meningitis, UTI, dehydration, electrolyte abnormality, AKI.  Patient nontoxic-appearing and in no acute  distress, vital signs remarkable for fever but otherwise reassuring and do not appear consistent with sepsis.  She was given dose of Tylenol and reports feeling slightly better, chest x-ray without evidence of pneumonia or other acute process.  Labs show renal function similar to previous with her known CKD, no significant electrolyte abnormality, anemia, or leukocytosis.  Urinalysis shows no signs of infection.  She did test positive for COVID-19, which is likely the source of her symptoms.  No findings to suggest meningitis.  She continues to breathe comfortably with oxygen saturations of 96% on room air.  She is appropriate for discharge home with outpatient management, unfortunately she is not a candidate for Paxlovid as it will interfere with her Eliquis.  She was counseled to continue using her inhaler regularly along with Mucinex.  She was counseled to follow-up with her PCP and to return to the ED for new or worsening symptoms, patient agrees with plan.      FINAL CLINICAL IMPRESSION(S) / ED DIAGNOSES   Final diagnoses:  COVID-19  Acute cough     Rx / DC Orders   ED Discharge Orders          Ordered    guaiFENesin (MUCINEX) 600 MG 12 hr tablet  2 times daily        11/16/21 1749             Note:  This document was prepared using Dragon voice recognition software and may include unintentional dictation errors.   Chesley Noon, MD 11/16/21 [phone]

## 2021-11-17 ENCOUNTER — Emergency Department: Payer: BC Managed Care – PPO

## 2021-11-17 ENCOUNTER — Inpatient Hospital Stay
Admission: EM | Admit: 2021-11-17 | Discharge: 2021-11-22 | DRG: 157 | Disposition: A | Discharge status: Home Health | Payer: BC Managed Care – PPO | Attending: Hospitalist | Admitting: Hospitalist

## 2021-11-17 ENCOUNTER — Other Ambulatory Visit: Payer: Self-pay

## 2021-11-17 DIAGNOSIS — E785 Hyperlipidemia, unspecified: Secondary | ICD-10-CM | POA: Diagnosis present

## 2021-11-17 DIAGNOSIS — I252 Old myocardial infarction: Secondary | ICD-10-CM

## 2021-11-17 DIAGNOSIS — N189 Chronic kidney disease, unspecified: Secondary | ICD-10-CM

## 2021-11-17 DIAGNOSIS — R7881 Bacteremia: Secondary | ICD-10-CM | POA: Diagnosis present

## 2021-11-17 DIAGNOSIS — J45909 Unspecified asthma, uncomplicated: Secondary | ICD-10-CM | POA: Diagnosis present

## 2021-11-17 DIAGNOSIS — I130 Hypertensive heart and chronic kidney disease with heart failure and stage 1 through stage 4 chronic kidney disease, or unspecified chronic kidney disease: Secondary | ICD-10-CM | POA: Diagnosis present

## 2021-11-17 DIAGNOSIS — N179 Acute kidney failure, unspecified: Secondary | ICD-10-CM | POA: Diagnosis not present

## 2021-11-17 DIAGNOSIS — N261 Atrophy of kidney (terminal): Secondary | ICD-10-CM | POA: Diagnosis present

## 2021-11-17 DIAGNOSIS — E861 Hypovolemia: Secondary | ICD-10-CM | POA: Diagnosis present

## 2021-11-17 DIAGNOSIS — B954 Other streptococcus as the cause of diseases classified elsewhere: Secondary | ICD-10-CM | POA: Diagnosis present

## 2021-11-17 DIAGNOSIS — Z90710 Acquired absence of both cervix and uterus: Secondary | ICD-10-CM

## 2021-11-17 DIAGNOSIS — N1832 Chronic kidney disease, stage 3b: Secondary | ICD-10-CM | POA: Diagnosis present

## 2021-11-17 DIAGNOSIS — Z87442 Personal history of urinary calculi: Secondary | ICD-10-CM

## 2021-11-17 DIAGNOSIS — R55 Syncope and collapse: Secondary | ICD-10-CM

## 2021-11-17 DIAGNOSIS — I10 Essential (primary) hypertension: Secondary | ICD-10-CM | POA: Diagnosis present

## 2021-11-17 DIAGNOSIS — I480 Paroxysmal atrial fibrillation: Secondary | ICD-10-CM | POA: Diagnosis present

## 2021-11-17 DIAGNOSIS — M1990 Unspecified osteoarthritis, unspecified site: Secondary | ICD-10-CM | POA: Diagnosis present

## 2021-11-17 DIAGNOSIS — K089 Disorder of teeth and supporting structures, unspecified: Principal | ICD-10-CM | POA: Diagnosis present

## 2021-11-17 DIAGNOSIS — B179 Acute viral hepatitis, unspecified: Secondary | ICD-10-CM | POA: Diagnosis not present

## 2021-11-17 DIAGNOSIS — Z951 Presence of aortocoronary bypass graft: Secondary | ICD-10-CM

## 2021-11-17 DIAGNOSIS — K7200 Acute and subacute hepatic failure without coma: Secondary | ICD-10-CM | POA: Diagnosis present

## 2021-11-17 DIAGNOSIS — I959 Hypotension, unspecified: Secondary | ICD-10-CM | POA: Diagnosis not present

## 2021-11-17 DIAGNOSIS — E872 Acidosis, unspecified: Secondary | ICD-10-CM

## 2021-11-17 DIAGNOSIS — R7989 Other specified abnormal findings of blood chemistry: Secondary | ICD-10-CM

## 2021-11-17 DIAGNOSIS — I4891 Unspecified atrial fibrillation: Secondary | ICD-10-CM | POA: Diagnosis present

## 2021-11-17 DIAGNOSIS — I2510 Atherosclerotic heart disease of native coronary artery without angina pectoris: Secondary | ICD-10-CM | POA: Diagnosis present

## 2021-11-17 DIAGNOSIS — D61818 Other pancytopenia: Secondary | ICD-10-CM | POA: Diagnosis present

## 2021-11-17 DIAGNOSIS — Z79899 Other long term (current) drug therapy: Secondary | ICD-10-CM

## 2021-11-17 DIAGNOSIS — Z8249 Family history of ischemic heart disease and other diseases of the circulatory system: Secondary | ICD-10-CM

## 2021-11-17 DIAGNOSIS — B955 Unspecified streptococcus as the cause of diseases classified elsewhere: Secondary | ICD-10-CM

## 2021-11-17 DIAGNOSIS — R7303 Prediabetes: Secondary | ICD-10-CM | POA: Diagnosis present

## 2021-11-17 DIAGNOSIS — R069 Unspecified abnormalities of breathing: Secondary | ICD-10-CM | POA: Diagnosis not present

## 2021-11-17 DIAGNOSIS — R112 Nausea with vomiting, unspecified: Secondary | ICD-10-CM

## 2021-11-17 DIAGNOSIS — U071 COVID-19: Secondary | ICD-10-CM

## 2021-11-17 DIAGNOSIS — J4520 Mild intermittent asthma, uncomplicated: Secondary | ICD-10-CM | POA: Diagnosis present

## 2021-11-17 DIAGNOSIS — E860 Dehydration: Secondary | ICD-10-CM | POA: Diagnosis present

## 2021-11-17 DIAGNOSIS — Z888 Allergy status to other drugs, medicaments and biological substances status: Secondary | ICD-10-CM

## 2021-11-17 DIAGNOSIS — Z7951 Long term (current) use of inhaled steroids: Secondary | ICD-10-CM

## 2021-11-17 DIAGNOSIS — I509 Heart failure, unspecified: Secondary | ICD-10-CM

## 2021-11-17 DIAGNOSIS — I38 Endocarditis, valve unspecified: Secondary | ICD-10-CM | POA: Diagnosis present

## 2021-11-17 DIAGNOSIS — Z8261 Family history of arthritis: Secondary | ICD-10-CM

## 2021-11-17 DIAGNOSIS — Z7901 Long term (current) use of anticoagulants: Secondary | ICD-10-CM

## 2021-11-17 DIAGNOSIS — K219 Gastro-esophageal reflux disease without esophagitis: Secondary | ICD-10-CM | POA: Diagnosis present

## 2021-11-17 DIAGNOSIS — I5032 Chronic diastolic (congestive) heart failure: Secondary | ICD-10-CM | POA: Diagnosis present

## 2021-11-17 LAB — COMPREHENSIVE METABOLIC PANEL
ALT: 3485 U/L — ABNORMAL HIGH (ref 0–44)
AST: 6502 U/L — ABNORMAL HIGH (ref 15–41)
Albumin: 3.7 g/dL (ref 3.5–5.0)
Alkaline Phosphatase: 132 U/L — ABNORMAL HIGH (ref 38–126)
Anion gap: 10 (ref 5–15)
BUN: 33 mg/dL — ABNORMAL HIGH (ref 8–23)
CO2: 18 mmol/L — ABNORMAL LOW (ref 22–32)
Calcium: 8 mg/dL — ABNORMAL LOW (ref 8.9–10.3)
Chloride: 108 mmol/L (ref 98–111)
Creatinine, Ser: 2.12 mg/dL — ABNORMAL HIGH (ref 0.44–1.00)
GFR, Estimated: 24 mL/min — ABNORMAL LOW (ref >60)
Glucose, Bld: 119 mg/dL — ABNORMAL HIGH (ref 70–99)
Potassium: 3.3 mmol/L — ABNORMAL LOW (ref 3.5–5.1)
Sodium: 136 mmol/L (ref 135–145)
Total Bilirubin: 1.5 mg/dL — ABNORMAL HIGH (ref 0.3–1.2)
Total Protein: 6.6 g/dL (ref 6.5–8.1)

## 2021-11-17 LAB — CBC WITH DIFFERENTIAL/PLATELET
Abs Immature Granulocytes: 0.03 10*3/uL (ref 0.00–0.07)
Basophils Absolute: 0 10*3/uL (ref 0.0–0.1)
Basophils Relative: 0 %
Eosinophils Absolute: 0 10*3/uL (ref 0.0–0.5)
Eosinophils Relative: 0 %
HCT: 33.5 % — ABNORMAL LOW (ref 36.0–46.0)
Hemoglobin: 10.6 g/dL — ABNORMAL LOW (ref 12.0–15.0)
Immature Granulocytes: 1 %
Lymphocytes Relative: 10 %
Lymphs Abs: 0.7 10*3/uL (ref 0.7–4.0)
MCH: 25.5 pg — ABNORMAL LOW (ref 26.0–34.0)
MCHC: 31.6 g/dL (ref 30.0–36.0)
MCV: 80.5 fL (ref 80.0–100.0)
Monocytes Absolute: 0.1 10*3/uL (ref 0.1–1.0)
Monocytes Relative: 2 %
Neutro Abs: 5.6 10*3/uL (ref 1.7–7.7)
Neutrophils Relative %: 87 %
Platelets: 138 10*3/uL — ABNORMAL LOW (ref 150–400)
RBC: 4.16 MIL/uL (ref 3.87–5.11)
RDW: 15 % (ref 11.5–15.5)
WBC: 6.4 10*3/uL (ref 4.0–10.5)
nRBC: 0 % (ref 0.0–0.2)

## 2021-11-17 LAB — URINE DRUG SCREEN, QUALITATIVE (ARMC ONLY)
Amphetamines, Ur Screen: NOT DETECTED
Barbiturates, Ur Screen: NOT DETECTED
Benzodiazepine, Ur Scrn: NOT DETECTED
Cannabinoid 50 Ng, Ur ~~LOC~~: NOT DETECTED
Cocaine Metabolite,Ur ~~LOC~~: NOT DETECTED
MDMA (Ecstasy)Ur Screen: NOT DETECTED
Methadone Scn, Ur: NOT DETECTED
Opiate, Ur Screen: NOT DETECTED
Phencyclidine (PCP) Ur S: NOT DETECTED
Tricyclic, Ur Screen: NOT DETECTED

## 2021-11-17 LAB — LACTIC ACID, PLASMA
Lactic Acid, Venous: 3.1 mmol/L (ref 0.5–1.9)
Lactic Acid, Venous: 2 mmol/L (ref 0.5–1.9)

## 2021-11-17 LAB — BRAIN NATRIURETIC PEPTIDE: B Natriuretic Peptide: 305 pg/mL — ABNORMAL HIGH (ref 0.0–100.0)

## 2021-11-17 LAB — TROPONIN I (HIGH SENSITIVITY)
Troponin I (High Sensitivity): 79 ng/L — ABNORMAL HIGH (ref <18)
Troponin I (High Sensitivity): 82 ng/L — ABNORMAL HIGH (ref <18)

## 2021-11-17 LAB — PROTIME-INR
INR: 2 — ABNORMAL HIGH (ref 0.8–1.2)
Prothrombin Time: 22.3 seconds — ABNORMAL HIGH (ref 11.4–15.2)

## 2021-11-17 LAB — ACETAMINOPHEN LEVEL: Acetaminophen (Tylenol), Serum: 16 ug/mL (ref 10–30)

## 2021-11-17 LAB — APTT: aPTT: 45 seconds — ABNORMAL HIGH (ref 24–36)

## 2021-11-17 MED ORDER — SODIUM CHLORIDE 0.9 % IV BOLUS
500.0000 mL | Freq: Once | INTRAVENOUS | Status: AC
  Administered 2021-11-17: 500 mL via INTRAVENOUS

## 2021-11-17 MED ORDER — ONDANSETRON HCL 4 MG PO TABS: 4.0000 mg | ORAL_TABLET | Freq: Four times a day (QID) | ORAL | Status: DC | PRN

## 2021-11-17 MED ORDER — LOPERAMIDE HCL 2 MG PO CAPS
2.0000 mg | ORAL_CAPSULE | ORAL | Status: DC | PRN
  Administered 2021-11-17 – 2021-11-19 (×2): 2 mg via ORAL
  Filled 2021-11-17 (×3): qty 1

## 2021-11-17 MED ORDER — ONDANSETRON HCL 4 MG/2ML IJ SOLN: 4.0000 mg | Freq: Once | INTRAMUSCULAR | Status: DC

## 2021-11-17 MED ORDER — POLYETHYLENE GLYCOL 3350 17 G PO PACK: 17.0000 g | PACK | Freq: Every day | ORAL | Status: DC | PRN

## 2021-11-17 MED ORDER — LACTATED RINGERS IV BOLUS
1000.0000 mL | Freq: Once | INTRAVENOUS | Status: AC
  Administered 2021-11-17: 1000 mL via INTRAVENOUS

## 2021-11-17 MED ORDER — ADULT MULTIVITAMIN W/MINERALS CH
1.0000 | ORAL_TABLET | Freq: Every day | ORAL | Status: DC
  Administered 2021-11-17 – 2021-11-21 (×5): 1 via ORAL
  Filled 2021-11-17 (×5): qty 1

## 2021-11-17 MED ORDER — GUAIFENESIN-DM 100-10 MG/5ML PO SYRP
10.0000 mL | ORAL_SOLUTION | ORAL | Status: DC | PRN
  Administered 2021-11-17 – 2021-11-22 (×5): 10 mL via ORAL
  Filled 2021-11-17 (×6): qty 10

## 2021-11-17 MED ORDER — ONDANSETRON HCL 4 MG/2ML IJ SOLN
4.0000 mg | Freq: Four times a day (QID) | INTRAMUSCULAR | Status: DC | PRN
  Administered 2021-11-17 – 2021-11-18 (×2): 4 mg via INTRAVENOUS
  Filled 2021-11-17 (×2): qty 2

## 2021-11-17 MED ORDER — ALBUTEROL SULFATE HFA 108 (90 BASE) MCG/ACT IN AERS: 1.0000 | INHALATION_SPRAY | RESPIRATORY_TRACT | Status: DC | PRN

## 2021-11-17 MED ORDER — FLUTICASONE FUROATE-VILANTEROL 100-25 MCG/ACT IN AEPB
1.0000 | INHALATION_SPRAY | Freq: Every day | RESPIRATORY_TRACT | Status: DC
  Administered 2021-11-17 – 2021-11-22 (×5): 1 via RESPIRATORY_TRACT
  Filled 2021-11-17: qty 28

## 2021-11-17 MED ORDER — APIXABAN 5 MG PO TABS
5.0000 mg | ORAL_TABLET | Freq: Two times a day (BID) | ORAL | Status: DC
  Administered 2021-11-17 – 2021-11-22 (×10): 5 mg via ORAL
  Filled 2021-11-17 (×10): qty 1

## 2021-11-17 MED ORDER — SODIUM CHLORIDE 0.9% FLUSH
3.0000 mL | Freq: Two times a day (BID) | INTRAVENOUS | Status: DC
  Administered 2021-11-17 – 2021-11-22 (×10): 3 mL via INTRAVENOUS

## 2021-11-17 MED ORDER — LACTATED RINGERS IV SOLN: INTRAVENOUS | Status: AC

## 2021-11-17 NOTE — ED Triage Notes (Signed)
Pt diagnosed with covid Friday, N/V/D since then. Bp in triage 71/30 62 HR. Temp 99, no O2 at home but 94 % on room air

## 2021-11-17 NOTE — H&P (Addendum)
History and Physical    Patient: Audrey Wolf [id number] DOB: 03-May-1950 DOA: 11/17/2021 DOS: the patient was seen and examined on 11/17/2021 PCP: Leone Haven, MD  Patient coming from: Home  Chief Complaint:  Chief Complaint  Patient presents with   Emesis   HPI: Audrey Wolf is a 71 y.o. female with medical history significant of CAD s/p CABG, HFpEF, hypertension, asthma, CKD stage IIIb who presents to the ED with complaints of nausea and vomiting.  [name] states she began feeling ill on 10/13 with fever, chills, productive cough, generalized weakness, malaise and fatigue.  She went to the ED on 10/14, where she was treated with Mucinex.  During that visit, she was noted to be normotensive at 134/73 with temperature 101.3.  After returning home she continued to feel poor, and notes dizziness when attempting to get out of her car and she fell.  She did not hit her head or have loss of consciousness.  Her symptoms continue to worsen and she developed nausea with vomiting and diarrhea.  She notes numerous episodes of not melanotic, nonbloody emesis and diarrhea throughout the past 24 hours. She denies any shortness of breath, chest pain, palpitations, focal weakness.   She notes that she has not taken her home antihypertensives since yesterday morning.  ED course: On arrival to the ED, patient had a blood pressure of 101/56 with heart rate of 66.  She was saturating at 96% on room air.  She was afebrile at 99 Fahrenheit.  Initial blood work remarkable for acute hepatitis with AST and ALT markedly elevated, elevated creatinine consistent with AKI.  Lactic acid elevated at 3.1.  Due to relative hypotension, acute hepatitis, and AKI, [facility] contacted for admission.  Review of Systems: As mentioned in the history of present illness. All other systems reviewed and are negative.  Past Medical History:  Diagnosis Date   Arthritis    Asthma    CHF (congestive heart  failure) (HCC)    Chronic kidney disease    Coronary artery disease    GERD (gastroesophageal reflux disease)    Headache    Hypertension    Nephrolithiasis    NSTEMI (non-ST elevated myocardial infarction) ([place])    Postprandial RUQ pain 05/25/2019   Past Surgical History:  Procedure Laterality Date   ABDOMINAL HYSTERECTOMY     APPENDECTOMY     BREAST BIOPSY Left 12/31/2010   neg   CORONARY ARTERY BYPASS GRAFT N/A 05/22/2017   Procedure: CORONARY ARTERY BYPASS GRAFTING (CABG) x 2 WITH ENDOSCOPIC HARVESTING OF RIGHT SAPHENOUS VEIN;  Surgeon: Grace Isaac, MD;  Location: [place];  Service: Open Heart Surgery;  Laterality: N/A;   LEFT HEART CATH AND CORONARY ANGIOGRAPHY N/A 05/18/2017   Procedure: LEFT HEART CATH AND CORONARY ANGIOGRAPHY;  Surgeon: Dionisio David, MD;  Location: [place] [facility];  Service: Cardiovascular;  Laterality: N/A;   LITHOTRIPSY     TEE WITHOUT CARDIOVERSION N/A 05/22/2017   Procedure: TRANSESOPHAGEAL ECHOCARDIOGRAM (TEE);  Surgeon: Grace Isaac, MD;  Location: [place];  Service: Open Heart Surgery;  Laterality: N/A;   TONSILLECTOMY     Social History:  reports that she has never smoked. She has never used smokeless tobacco. She reports that she does not drink alcohol and does not use drugs.  Allergies  Allergen Reactions   Lisinopril Cough   Ace Inhibitors Other (See Comments)    Has been told to avoid these and any diuretics because "left kidney has shrunk  up and doesn't work"    Family History  Problem Relation Age of Onset   Arthritis Mother    Arthritis Father    Heart disease Father    Stroke Father    Sudden Cardiac Death Father    Stroke Son    Breast cancer Maternal Aunt     Prior to Admission medications   Medication Sig Start Date End Date Taking? Authorizing Provider  acetaminophen (TYLENOL) 500 MG tablet Take 1 tablet (500 mg total) by mouth daily as needed for mild pain or headache. 05/27/17   Nani Skillern, PA-C   albuterol (VENTOLIN HFA) 108 (90 Base) MCG/ACT inhaler SMARTSIG:2 Puff(s) By Mouth Every 4-6 Hours PRN 10/09/21   [provider]  amiodarone (PACERONE) 400 MG tablet Take 1 tablet (400 mg total) by mouth 2 (two) times daily. 09/22/20 07/23/21  Val Riles, MD  apixaban (ELIQUIS) 5 MG TABS tablet Take 1 tablet (5 mg total) by mouth 2 (two) times daily. 09/22/20 07/23/21  Val Riles, MD  budesonide-formoterol (SYMBICORT) 80-4.5 MCG/ACT inhaler Inhale 2 puffs into the lungs 2 (two) times daily. 08/30/20   Leone Haven, MD  carvedilol (COREG) 25 MG tablet Take 1 tablet (25 mg total) by mouth 2 (two) times daily with a meal. 07/19/21   Leone Haven, MD  diltiazem (CARDIZEM) 30 MG tablet Take 1 tablet (30 mg total) by mouth every 6 (six) hours. 09/22/20 07/23/21  Val Riles, MD  famotidine (PEPCID) 20 MG tablet Take 20 mg by mouth 2 (two) times daily.    [provider]  ferrous sulfate 325 (65 FE) MG tablet Take 1 tablet (325 mg total) by mouth daily with breakfast. 11/08/21   Leone Haven, MD  guaiFENesin (MUCINEX) 600 MG 12 hr tablet Take 1 tablet (600 mg total) by mouth 2 (two) times daily for 7 days. 11/16/21 11/23/21  Blake Divine, MD  loratadine (CLARITIN) 10 MG tablet Take 10 mg by mouth daily.     [provider]  losartan (COZAAR) 25 MG tablet Take 25 mg by mouth daily.    [provider]  Multiple Vitamins-Minerals (PRESERVISION AREDS 2) CAPS Take 2 capsules by mouth daily.    [provider]  rosuvastatin (CRESTOR) 20 MG tablet Take 1 tablet (20 mg total) by mouth daily. 07/19/21   Leone Haven, MD  tolterodine (DETROL LA) 4 MG 24 hr capsule Take 1 capsule (4 mg total) by mouth daily. 10/11/20   Billey Co, MD    Physical Exam: Vitals:   11/17/21 1700 11/17/21 1730 11/17/21 1800 11/17/21 1930  BP: (!) 136/55 (!) 166/57 (!) 144/52 117/62  Pulse: 67 67 65 71  Resp: 17 (!) 21 12 18   Temp:   98.5 F (36.9 C)    TempSrc:   Oral   SpO2: 91% 93% 99% 99%  Weight:      Height:       Physical Exam Vitals and nursing note reviewed.  Constitutional:      General: She is not in acute distress.    Appearance: She is normal weight. She is ill-appearing.     Interventions: Face mask in place.  HENT:     Head: Normocephalic and atraumatic.  Eyes:     General: No scleral icterus.    Conjunctiva/sclera: Conjunctivae normal.     Pupils: Pupils are equal, round, and reactive to light.  Cardiovascular:     Rate and Rhythm: Normal rate and regular rhythm.  Heart sounds: No murmur heard.    No gallop.  Pulmonary:     Effort: Pulmonary effort is normal. No respiratory distress.     Breath sounds: Normal breath sounds. No wheezing, rhonchi or rales.  Abdominal:     General: Bowel sounds are normal. There is no distension.     Palpations: Abdomen is soft. There is no hepatomegaly.     Tenderness: There is no abdominal tenderness. There is no guarding.  Musculoskeletal:     Right lower leg: No edema.     Left lower leg: No edema.  Skin:    General: Skin is warm and dry.  Neurological:     General: No focal deficit present.     Mental Status: She is alert and oriented to person, place, and time.     Motor: No weakness.     Comments: No asterixis noted  Psychiatric:        Mood and Affect: Mood normal.        Behavior: Behavior normal.    Data Reviewed: Initial blood work remarkable for potassium of 3.3, bicarb of 18, glucose of 119, BUN of 33, creatinine of 2.12 (compared to 1.3 24 hours prior).  LFTs were markedly elevated with AST of 6500 and ALT of 3400.  Total bilirubin elevated at 1.5.  BUN elevated at 305.  Trops elevated at 79 with flat trend to 82.  Lactic acid initially elevated at 3.1 and improved to 2.0.  Hemoglobin at baseline at 10.6.  Platelets low at 138.  INR elevated at 2.0 with PT of 22.  PTT elevated at 45.  Urinalysis demonstrates proteinuria with rare bacteria and  mucus.  Chest x-ray personally reviewed with results notable for no focal opacities.  No pleural effusions noted.  EKG personally reviewed.  Results notable for sinus rhythm with rate of 60.  No acute ST or T wave changes concerning for acute ischemia.  Right upper quadrant ultrasound obtained that demonstrated slight increased hepatic echogenicity and 5 mm gallbladder polyp but no other evidence of acute abnormalities.  COVID-19 PCR positive.  Results are pending, will review when available.  Assessment and Plan:  * Hypotension Patient presenting with a blood pressure of 101/56. Per chart review, her blood pressure is typically significantly above that.  This is likely secondary to hypovolemia is in the setting of COVID-19 infection with active fever, vomiting and diarrhea. No evidence of sepsis given lack of tachycardia, tachypnea and leukocytosis.  - IVF: LR 1000 cc bolus followed by LR infusion @ 100cc/hr  - Continue telemetry monitoring until hemodynamic stability is achieved - Hold home antihypertensives  Acute hepatitis Patient is presenting with markedly elevated LFTs with AST of 6500 and ALT of 3400.  INR is elevated at 2.0 (patient is on Eliquis, however do not suspect this would cause an INR elevation). This is consistent with acute liver injury.  No evidence of encephalopathy to suggest acute liver failure.  Etiology is undetermined at this time.  Differential includes medication induced (amiodarone, statin) versus ischemic in the setting of hypotension.  No previous evaluation for viral hepatitis, so we will obtain at this time.    - Consulted gastroenterology (discussed with Dr. Alice Reichert); appreciate their recommendations - Daily CMP and PT/INR - Acute viral hepatitis panel pending - Hold amiodarone and statin - Management of hypotension as noted above  Acute kidney injury superimposed on chronic kidney disease ([place]) Patient has a history of CKD stage IIIb with only right  kidney functional.  Presenting today with creatinine of 2.12, previously 1.39 yesterday.  Likely in the setting of hypovolemia due to ongoing emesis and diarrhea.  -BMP daily - IV fluids as noted above - Avoid nephrotoxic agents, including home losartan  COVID-19 virus infection Patient presenting with 3 days of fever, malaise, cough, N/V/D, and generalized weakness.  No respiratory symptoms at this time no evidence of pneumonia on chest x-ray.  Discussed with pharmacy; due to AKI and acute hepatitis, patient is not a candidate for any antivirals.  No indication for Decadron at this time.  Supportive management only.  - IV fluids as noted above - Zofran as needed for nausea - Imodium as needed for diarrhea - Robitussin as needed for cough  Asthma No evidence of wheezing on examination and patient declined shortness of breath.  -Continue home bronchodilators  Atrial fibrillation (HCC) Sinus rhythm on EKG today.  - Restart home Eliquis - Holding home amiodarone due to acute hepatitis - Holding home diltiazem and carvedilol due to hypotension - Monitor via telemetry for rebound tachycardia  CHF (congestive heart failure) ([place]) Patient is hypovolemic on examination.  She is not on diuretics at home and currently holding GDMT due to hypotension.  CAD (coronary artery disease), native coronary artery - Holding home statin due to acute hepatitis  Metabolic acidosis, normal anion gap (NAG) In the setting of vomiting and diarrhea.  No indication for bicarb therapy at this time.   Advance Care Planning:   Code Status: Full Code  Consults: Gastroenterology  Family Communication: Patient's son and grandson updated at bedside  Severity of Illness: The appropriate patient status for this patient is OBSERVATION. Observation status is judged to be reasonable and necessary in order to provide the required intensity of service to ensure the patient's safety. The patient's presenting  symptoms, physical exam findings, and initial radiographic and laboratory data in the context of their medical condition is felt to place them at decreased risk for further clinical deterioration. Furthermore, it is anticipated that the patient will be medically stable for discharge from the hospital within 2 midnights of admission.   Author: Jose Persia, MD 11/17/2021 8:28 PM  For on call review [email or web address].

## 2021-11-17 NOTE — Assessment & Plan Note (Signed)
Patient is hypovolemic on examination.  She is not on diuretics at home and currently holding GDMT due to hypotension.

## 2021-11-17 NOTE — ED Notes (Signed)
Pt taken to room directly after first set of VS.

## 2021-11-17 NOTE — Assessment & Plan Note (Addendum)
Patient presenting with 3 days of fever, malaise, cough, N/V/D, and generalized weakness.  No respiratory symptoms at this time no evidence of pneumonia on chest x-ray.  Discussed with pharmacy; due to AKI and acute hepatitis, patient is not a candidate for any antivirals.  No indication for Decadron at this time.  Supportive management only.  - IV fluids as noted above - Zofran as needed for nausea - Imodium as needed for diarrhea - Robitussin as needed for cough

## 2021-11-17 NOTE — Assessment & Plan Note (Signed)
Patient has a history of CKD stage IIIb with only right kidney functional.  Presenting with creatinine of 2.12, previously 1.39 the day prior.  Likely in the setting of hypovolemia due to ongoing emesis and diarrhea. --s/p IVF and MIVF with improvement in Cr to 1.18 today Plan: --hold further MIVF and encourage oral hydration

## 2021-11-17 NOTE — Assessment & Plan Note (Addendum)
No evidence of wheezing on examination and patient denied shortness of breath.  -Continue home bronchodilators

## 2021-11-17 NOTE — Assessment & Plan Note (Signed)
Patient presenting with a blood pressure of 101/56. Per chart review, her blood pressure is typically significantly above that.  This is likely secondary to hypovolemia is in the setting of COVID-19 infection with active fever, vomiting and diarrhea. No evidence of sepsis given lack of tachycardia, tachypnea and leukocytosis.  - IVF: LR 1000 cc bolus followed by LR infusion @ 100cc/hr  - Continue telemetry monitoring until hemodynamic stability is achieved - Hold home antihypertensives

## 2021-11-17 NOTE — ED Provider Notes (Signed)
[facility] Provider Note    Event Date/Time   First MD Initiated Contact with Patient 11/17/21 1551     (approximate)   History   Emesis   HPI  Audrey Wolf is a 71 y.o. female history of CAD Status post CABG as well as congestive heart failure recent diagnosis of COVID-19 presents to the ER for worsening shortness of breath near syncopal episodes malaise and fatigue.  Is having some nausea as well as loose stool.  Denies any abdominal pain.  Has had not had much appetite.     Physical Exam   Triage Vital Signs: ED Triage Vitals  Enc Vitals Group     BP 11/17/21 1551 (!) 101/56     Pulse Rate 11/17/21 1551 70     Resp 11/17/21 1551 18     Temp 11/17/21 1551 99 F (37.2 C)     Temp Source 11/17/21 1551 Oral     SpO2 11/17/21 1551 96 %     Weight 11/17/21 1554 200 lb (90.7 kg)     Height 11/17/21 1554 5\' 9"  (1.753 m)     Head Circumference --      Peak Flow --      Pain Score 11/17/21 1553 4     Pain Loc --      Pain Edu? --      Excl. in GC? --     Most recent vital signs: Vitals:   11/17/21 1730 11/17/21 1800  BP: (!) 166/57 (!) 144/52  Pulse: 67 65  Resp: (!) 21 12  Temp:  98.5 F (36.9 C)  SpO2: 93% 99%     Constitutional: Alert frail appearing Eyes: Conjunctivae are normal.  Head: Atraumatic. Nose: No congestion/rhinnorhea. Mouth/Throat: Mucous membranes are moist.   Neck: Painless ROM.  Cardiovascular:   Good peripheral circulation. Respiratory: mild tachypnea  Gastrointestinal: Soft and nontender.  Musculoskeletal:  no deformity Neurologic:  MAE spontaneously. No gross focal neurologic deficits are appreciated.  Skin:  Skin is warm, dry and intact. No rash noted. Psychiatric: Mood and affect are normal. Speech and behavior are normal.    ED Results / Procedures / Treatments   Labs (all labs ordered are listed, but only abnormal results are displayed) Labs Reviewed  LACTIC ACID, PLASMA - Abnormal; Notable  for the following components:      Result Value   Lactic Acid, Venous 3.1 (*)    All other components within normal limits  LACTIC ACID, PLASMA - Abnormal; Notable for the following components:   Lactic Acid, Venous 2.0 (*)    All other components within normal limits  COMPREHENSIVE METABOLIC PANEL - Abnormal; Notable for the following components:   Potassium 3.3 (*)    CO2 18 (*)    Glucose, Bld 119 (*)    BUN 33 (*)    Creatinine, Ser 2.12 (*)    Calcium 8.0 (*)    AST 6,502 (*)    ALT 3,485 (*)    Alkaline Phosphatase 132 (*)    Total Bilirubin 1.5 (*)    GFR, Estimated 24 (*)    All other components within normal limits  CBC WITH DIFFERENTIAL/PLATELET - Abnormal; Notable for the following components:   Hemoglobin 10.6 (*)    HCT 33.5 (*)    MCH 25.5 (*)    Platelets 138 (*)    All other components within normal limits  PROTIME-INR - Abnormal; Notable for the following components:   Prothrombin Time 22.3 (*)  INR 2.0 (*)    All other components within normal limits  APTT - Abnormal; Notable for the following components:   aPTT 45 (*)    All other components within normal limits  BRAIN NATRIURETIC PEPTIDE - Abnormal; Notable for the following components:   B Natriuretic Peptide 305.0 (*)    All other components within normal limits  TROPONIN I (HIGH SENSITIVITY) - Abnormal; Notable for the following components:   Troponin I (High Sensitivity) 79 (*)    All other components within normal limits  CULTURE, BLOOD (ROUTINE X 2)  CULTURE, BLOOD (ROUTINE X 2)  ACETAMINOPHEN LEVEL  URINALYSIS, COMPLETE (UACMP) WITH MICROSCOPIC  TROPONIN I (HIGH SENSITIVITY)     EKG  ED ECG REPORT I, Willy Eddy, the attending physician, personally viewed and interpreted this ECG.   Date: 11/17/2021  EKG Time: 16:03  Rate: 70  Rhythm: somis  Axis:normal  Intervals: prolonged qt  ST&T Change: nonspecific st abn, no stemi    RADIOLOGY Please see ED Course for my review and  interpretation.  I personally reviewed all radiographic images ordered to evaluate for the above acute complaints and reviewed radiology reports and findings.  These findings were personally discussed with the patient.  Please see medical record for radiology report.    PROCEDURES:  Critical Care performed: Yes, see critical care procedure note(s)  .Critical Care  Performed by: Willy Eddy, MD Authorized by: Willy Eddy, MD   Critical care provider statement:    Critical care time (minutes):  36   Critical care was necessary to treat or prevent imminent or life-threatening deterioration of the following conditions:  Circulatory failure   Critical care was time spent personally by me on the following activities:  Ordering and performing treatments and interventions, ordering and review of laboratory studies, ordering and review of radiographic studies, pulse oximetry, re-evaluation of patient's condition, review of old charts, obtaining history from patient or surrogate, examination of patient, evaluation of patient's response to treatment, discussions with primary provider, discussions with consultants and development of treatment plan with patient or surrogate    MEDICATIONS ORDERED IN ED: Medications  sodium chloride 0.9 % bolus 500 mL (0 mLs Intravenous Stopped 11/17/21 1654)  sodium chloride 0.9 % bolus 500 mL (500 mLs Intravenous New Bag/Given 11/17/21 1657)     IMPRESSION / MDM / ASSESSMENT AND PLAN / ED COURSE  I reviewed the triage vital signs and the nursing notes.                              Differential diagnosis includes, but is not limited to, duration, electrolyte abnormality, COVID-19, enteritis, gastritis, anemia, sepsis  Patient presenting to the ER for evaluation of symptoms as described above.  Based on symptoms, risk factors and considered above differential, this presenting complaint could reflect a potentially life-threatening illness therefore  the patient will be placed on continuous pulse oximetry and telemetry for monitoring.  Laboratory evaluation will be sent to evaluate for the above complaints.      Clinical Course as of 11/17/21 1826  Wynelle Link Nov 17, 2021  1617 Chest x-ray on my review and interpretation does not show any evidence of consolidation. [PR]  1641 Patient's lactate is 3.1.  No leukocytosis.  At this SIRS is likely secondary to her known COVID-19.  We will continue with IV hydration. [PR]  1727 Patient with significantly elevated transaminitis.  She denies any alcohol use.  Does have AKI. [  PR]  1727 Noted elevated 79 BNP also elevated do suspect this is demand ischemia in the setting of her surgical tear episodes of hypotension.  She states that she did not take any more than 4 g over the past 24 hours of Tylenol.  Will check Tylenol level. [PR]  1825 Tylenol level normal.  Repeat lactate downtrending.  Case discussed in consultation with hospitalist for admission. [PR]    Clinical Course User Index [PR] Merlyn Lot, MD     FINAL CLINICAL IMPRESSION(S) / ED DIAGNOSES   Final diagnoses:  Syncope and collapse  COVID-19  Elevated LFTs     Rx / DC Orders   ED Discharge Orders     None        Note:  This document was prepared using Dragon voice recognition software and may include unintentional dictation errors.    Merlyn Lot, MD 11/17/21 [phone]

## 2021-11-17 NOTE — Assessment & Plan Note (Signed)
-   Holding home statin due to acute hepatitis

## 2021-11-17 NOTE — ED Triage Notes (Signed)
Pt in via EMS from home with c/o weakness, sob Pt was dx'd with COVID yesterday, sx's worse over the last 24 hours. Pt reports feels like she can't get full air in. Pt was placed on 3L for comfort. Pt feels like she may pass out. 100.3 temp. Pt took 200mg  of tylenol. 82/56, 97%, 81HR

## 2021-11-17 NOTE — Assessment & Plan Note (Addendum)
Sinus rhythm on EKG. Plan: --cont Eliquis - Holding home amiodarone due to acute hepatitis --resume home coreg - Holding home diltiazem for now

## 2021-11-17 NOTE — Assessment & Plan Note (Addendum)
Patient is presenting with markedly elevated LFTs with AST of 6500 and ALT of 3400.  INR is elevated at 2.0 (patient is on Eliquis, however do not suspect this would cause an INR elevation). This is consistent with acute liver injury.  No evidence of encephalopathy to suggest acute liver failure.  Etiology is undetermined at this time.  Differential includes medication induced (amiodarone, statin) versus ischemic in the setting of hypotension.  No previous evaluation for viral hepatitis, so we will obtain at this time.    - Consulted gastroenterology (discussed with Dr. Alice Reichert); appreciate their recommendations - Daily CMP and PT/INR - Acute viral hepatitis panel pending - Hold amiodarone and statin - Management of hypotension as noted above

## 2021-11-17 NOTE — Assessment & Plan Note (Signed)
In the setting of vomiting and diarrhea.  No indication for bicarb therapy at this time.

## 2021-11-18 DIAGNOSIS — R7881 Bacteremia: Secondary | ICD-10-CM | POA: Diagnosis present

## 2021-11-18 DIAGNOSIS — E785 Hyperlipidemia, unspecified: Secondary | ICD-10-CM | POA: Diagnosis present

## 2021-11-18 DIAGNOSIS — K089 Disorder of teeth and supporting structures, unspecified: Secondary | ICD-10-CM | POA: Diagnosis present

## 2021-11-18 DIAGNOSIS — R112 Nausea with vomiting, unspecified: Secondary | ICD-10-CM | POA: Diagnosis not present

## 2021-11-18 DIAGNOSIS — E872 Acidosis, unspecified: Secondary | ICD-10-CM | POA: Diagnosis present

## 2021-11-18 DIAGNOSIS — E861 Hypovolemia: Secondary | ICD-10-CM | POA: Diagnosis present

## 2021-11-18 DIAGNOSIS — E860 Dehydration: Secondary | ICD-10-CM | POA: Diagnosis present

## 2021-11-18 DIAGNOSIS — I130 Hypertensive heart and chronic kidney disease with heart failure and stage 1 through stage 4 chronic kidney disease, or unspecified chronic kidney disease: Secondary | ICD-10-CM | POA: Diagnosis present

## 2021-11-18 DIAGNOSIS — N261 Atrophy of kidney (terminal): Secondary | ICD-10-CM | POA: Diagnosis present

## 2021-11-18 DIAGNOSIS — N189 Chronic kidney disease, unspecified: Secondary | ICD-10-CM | POA: Diagnosis not present

## 2021-11-18 DIAGNOSIS — R55 Syncope and collapse: Secondary | ICD-10-CM | POA: Diagnosis not present

## 2021-11-18 DIAGNOSIS — N1832 Chronic kidney disease, stage 3b: Secondary | ICD-10-CM | POA: Diagnosis present

## 2021-11-18 DIAGNOSIS — N179 Acute kidney failure, unspecified: Secondary | ICD-10-CM | POA: Diagnosis present

## 2021-11-18 DIAGNOSIS — J4520 Mild intermittent asthma, uncomplicated: Secondary | ICD-10-CM | POA: Diagnosis present

## 2021-11-18 DIAGNOSIS — B955 Unspecified streptococcus as the cause of diseases classified elsewhere: Secondary | ICD-10-CM | POA: Diagnosis not present

## 2021-11-18 DIAGNOSIS — I480 Paroxysmal atrial fibrillation: Secondary | ICD-10-CM | POA: Diagnosis present

## 2021-11-18 DIAGNOSIS — R7989 Other specified abnormal findings of blood chemistry: Secondary | ICD-10-CM | POA: Diagnosis not present

## 2021-11-18 DIAGNOSIS — I2510 Atherosclerotic heart disease of native coronary artery without angina pectoris: Secondary | ICD-10-CM | POA: Diagnosis present

## 2021-11-18 DIAGNOSIS — B954 Other streptococcus as the cause of diseases classified elsewhere: Secondary | ICD-10-CM | POA: Diagnosis present

## 2021-11-18 DIAGNOSIS — R7303 Prediabetes: Secondary | ICD-10-CM | POA: Diagnosis present

## 2021-11-18 DIAGNOSIS — M1990 Unspecified osteoarthritis, unspecified site: Secondary | ICD-10-CM | POA: Diagnosis present

## 2021-11-18 DIAGNOSIS — I959 Hypotension, unspecified: Secondary | ICD-10-CM | POA: Diagnosis present

## 2021-11-18 DIAGNOSIS — B179 Acute viral hepatitis, unspecified: Secondary | ICD-10-CM | POA: Diagnosis present

## 2021-11-18 DIAGNOSIS — Z7901 Long term (current) use of anticoagulants: Secondary | ICD-10-CM | POA: Diagnosis not present

## 2021-11-18 DIAGNOSIS — I5032 Chronic diastolic (congestive) heart failure: Secondary | ICD-10-CM | POA: Diagnosis present

## 2021-11-18 DIAGNOSIS — K7200 Acute and subacute hepatic failure without coma: Secondary | ICD-10-CM | POA: Diagnosis present

## 2021-11-18 DIAGNOSIS — I38 Endocarditis, valve unspecified: Secondary | ICD-10-CM | POA: Diagnosis present

## 2021-11-18 DIAGNOSIS — U071 COVID-19: Secondary | ICD-10-CM | POA: Diagnosis present

## 2021-11-18 DIAGNOSIS — D61818 Other pancytopenia: Secondary | ICD-10-CM | POA: Diagnosis present

## 2021-11-18 LAB — D-DIMER, QUANTITATIVE: D-Dimer, Quant: 7.88 ug/mL-FEU — ABNORMAL HIGH (ref 0.00–0.50)

## 2021-11-18 LAB — COMPREHENSIVE METABOLIC PANEL
ALT: 3196 U/L — ABNORMAL HIGH (ref 0–44)
AST: 4973 U/L — ABNORMAL HIGH (ref 15–41)
Albumin: 3.2 g/dL — ABNORMAL LOW (ref 3.5–5.0)
Alkaline Phosphatase: 118 U/L (ref 38–126)
Anion gap: 7 (ref 5–15)
BUN: 33 mg/dL — ABNORMAL HIGH (ref 8–23)
CO2: 21 mmol/L — ABNORMAL LOW (ref 22–32)
Calcium: 7.8 mg/dL — ABNORMAL LOW (ref 8.9–10.3)
Chloride: 110 mmol/L (ref 98–111)
Creatinine, Ser: 1.82 mg/dL — ABNORMAL HIGH (ref 0.44–1.00)
GFR, Estimated: 29 mL/min — ABNORMAL LOW (ref >60)
Glucose, Bld: 99 mg/dL (ref 70–99)
Potassium: 3.6 mmol/L (ref 3.5–5.1)
Sodium: 138 mmol/L (ref 135–145)
Total Bilirubin: 1.6 mg/dL — ABNORMAL HIGH (ref 0.3–1.2)
Total Protein: 5.7 g/dL — ABNORMAL LOW (ref 6.5–8.1)

## 2021-11-18 LAB — CBC WITH DIFFERENTIAL/PLATELET
Abs Immature Granulocytes: 0.02 10*3/uL (ref 0.00–0.07)
Basophils Absolute: 0 10*3/uL (ref 0.0–0.1)
Basophils Relative: 0 %
Eosinophils Absolute: 0 10*3/uL (ref 0.0–0.5)
Eosinophils Relative: 0 %
HCT: 30.2 % — ABNORMAL LOW (ref 36.0–46.0)
Hemoglobin: 9.3 g/dL — ABNORMAL LOW (ref 12.0–15.0)
Immature Granulocytes: 0 %
Lymphocytes Relative: 14 %
Lymphs Abs: 1 10*3/uL (ref 0.7–4.0)
MCH: 25.3 pg — ABNORMAL LOW (ref 26.0–34.0)
MCHC: 30.8 g/dL (ref 30.0–36.0)
MCV: 82.3 fL (ref 80.0–100.0)
Monocytes Absolute: 0.1 10*3/uL (ref 0.1–1.0)
Monocytes Relative: 2 %
Neutro Abs: 5.7 10*3/uL (ref 1.7–7.7)
Neutrophils Relative %: 84 %
Platelets: 115 10*3/uL — ABNORMAL LOW (ref 150–400)
RBC: 3.67 MIL/uL — ABNORMAL LOW (ref 3.87–5.11)
RDW: 14.9 % (ref 11.5–15.5)
WBC: 6.7 10*3/uL (ref 4.0–10.5)
nRBC: 0 % (ref 0.0–0.2)

## 2021-11-18 LAB — HEPATITIS PANEL, ACUTE
HCV Ab: NONREACTIVE
Hep A IgM: NONREACTIVE
Hep B C IgM: NONREACTIVE
Hepatitis B Surface Ag: NONREACTIVE

## 2021-11-18 LAB — URINALYSIS, COMPLETE (UACMP) WITH MICROSCOPIC
Bilirubin Urine: NEGATIVE
Glucose, UA: NEGATIVE mg/dL
Ketones, ur: NEGATIVE mg/dL
Leukocytes,Ua: NEGATIVE
Nitrite: NEGATIVE
Protein, ur: 100 mg/dL — AB
Specific Gravity, Urine: 1.018 (ref 1.005–1.030)
pH: 5 (ref 5.0–8.0)

## 2021-11-18 LAB — C-REACTIVE PROTEIN: CRP: 9.4 mg/dL — ABNORMAL HIGH (ref <1.0)

## 2021-11-18 LAB — FERRITIN: Ferritin: 1392 ng/mL — ABNORMAL HIGH (ref 11–307)

## 2021-11-18 LAB — PROTIME-INR
INR: 3 — ABNORMAL HIGH (ref 0.8–1.2)
Prothrombin Time: 30.6 seconds — ABNORMAL HIGH (ref 11.4–15.2)

## 2021-11-18 LAB — MAGNESIUM: Magnesium: 1.9 mg/dL (ref 1.7–2.4)

## 2021-11-18 MED ORDER — HYDRALAZINE HCL 20 MG/ML IJ SOLN
10.0000 mg | Freq: Four times a day (QID) | INTRAMUSCULAR | Status: DC | PRN
  Administered 2021-11-19: 10 mg via INTRAVENOUS
  Filled 2021-11-18: qty 1

## 2021-11-18 MED ORDER — VANCOMYCIN HCL IN DEXTROSE 1-5 GM/200ML-% IV SOLN
1000.0000 mg | Freq: Once | INTRAVENOUS | Status: AC
  Administered 2021-11-18: 1000 mg via INTRAVENOUS
  Filled 2021-11-18: qty 200

## 2021-11-18 MED ORDER — VANCOMYCIN HCL IN DEXTROSE 1-5 GM/200ML-% IV SOLN: 1000.0000 mg | INTRAVENOUS | Status: DC

## 2021-11-18 MED ORDER — SODIUM CHLORIDE 0.9 % IV SOLN
2.0000 g | INTRAVENOUS | Status: DC
  Administered 2021-11-18 – 2021-11-22 (×5): 2 g via INTRAVENOUS
  Filled 2021-11-18: qty 20
  Filled 2021-11-18: qty 2
  Filled 2021-11-18 (×3): qty 20

## 2021-11-18 MED ORDER — VANCOMYCIN HCL 1250 MG/250ML IV SOLN
1250.0000 mg | Freq: Once | INTRAVENOUS | Status: DC
  Filled 2021-11-18: qty 250

## 2021-11-18 MED ORDER — CARVEDILOL 25 MG PO TABS
25.0000 mg | ORAL_TABLET | Freq: Two times a day (BID) | ORAL | Status: DC
  Administered 2021-11-18 – 2021-11-22 (×8): 25 mg via ORAL
  Filled 2021-11-18 (×8): qty 1

## 2021-11-18 MED ORDER — SODIUM CHLORIDE 0.9 % IV SOLN: INTRAVENOUS | Status: DC

## 2021-11-18 NOTE — Progress Notes (Signed)
PROGRESS NOTE    Audrey Wolf  WUJ:[id number] DOB: 1950/10/10 DOA: 11/17/2021 PCP: Leone Haven, MD  ED10A/ED10A  LOS: 0 days   Brief hospital course:   Assessment & Plan: Audrey Wolf is a 71 y.o. female with medical history significant of CAD s/p CABG, HFpEF, hypertension, asthma, CKD stage IIIb who presents to the ED with complaints of nausea and vomiting.   Pt began feeling ill on 10/13 with fever, chills, productive cough, generalized weakness, malaise and fatigue.  She went to the ED on 10/14, and was dx with covid and discharged home.  After returning home she continued to feel poor, and notes dizziness when attempting to get out of her car and she fell.    * Nausea & vomiting --pt's main complaint on presentation.  Maybe due to covid infection.  Resolved after presentation.  Bacteremia due to Streptococcus --started on IV vanc and switched to ceftriaxone --cont ceftriaxone 2g daily  Acute hepatitis --AST 6000's, ALT 3000's on presentation.  GI consulted, clinical presentation most c/w ischemic hepatitis 2/2 hypoperfusion to liver in setting of dehydration and COVID-19 virus infection.   --hepatitis panel neg.  No recent abx use. 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  Presenting today with creatinine of 2.12, previously 1.39 yesterday.  Likely in the setting of hypovolemia due to ongoing emesis and diarrhea. --s/p IVF and MIVF Plan: --cont MIVF@50  for 20 hours  COVID-19 virus infection Patient presenting with 3 days of fever, malaise, cough, N/V/D, and generalized weakness.  No respiratory symptoms at this time no evidence of pneumonia on chest x-ray.  Discussed with pharmacy; due to AKI and acute hepatitis, patient is not a candidate for any antivirals.  No indication for  Decadron at this time.   --Supportive management only.   Asthma No evidence of wheezing on examination and patient denied shortness of breath.  -Continue home bronchodilators  Paroxysmal atrial fibrillation (HCC) Sinus rhythm on EKG. Plan: --cont Eliquis - Holding home amiodarone due to acute hepatitis --resume home coreg - Holding home diltiazem for now  CAD (coronary artery disease), native coronary artery - Holding home statin due to acute hepatitis  Metabolic acidosis, normal anion gap (NAG) In the setting of vomiting and diarrhea.  No indication for bicarb therapy at this time.  Chronic diastolic CHF (congestive heart failure) (HCC) --stable.    Hypertension --home BP held on admission due to low normal BP --resume home coreg --hold home dilt for now --IV hydralazine PRN   DVT prophylaxis: [id number] Code Status: Full code  Family Communication:  Level of care: Med-Surg Dispo:   The patient is from: home Anticipated d/c is to: home Anticipated d/c date is: 2-3 days   Subjective and Interval History:  Pt reported coughing, no appetite.  No pain, no dysuria.   Objective: Vitals:   11/18/21 0800 11/18/21 0900 11/18/21 1000 11/18/21 1654  BP: (!) 179/59 (!) 178/65 (!) 169/65 (!) 181/51  Pulse: 68 65 65 66  Resp: 20 19 14 17   Temp:    97.6 F (36.4 C)  TempSrc:    Oral  SpO2: 96% 96% 98% 99%  Weight:      Height:        Intake/Output Summary (Last 24 hours) at 11/18/2021 1850 Last data filed at 11/18/2021 1013 Gross per 24  hour  Intake 2566.97 ml  Output --  Net 2566.97 ml   Filed Weights   11/17/21 1554 11/18/21 0517  Weight: 90.7 kg 90.7 kg    Examination:   Constitutional: NAD, AAOx3 HEENT: conjunctivae and lids normal, EOMI CV: No cyanosis.   RESP: normal respiratory effort, on RA Extremities: No effusions, edema in BLE SKIN: warm, dry Neuro: II - XII grossly intact.   Psych: depressed mood and affect.     Data Reviewed: I have  personally reviewed labs and imaging studies  Time spent: 50 minutes  Enzo Bi, MD [facility] If 7PM-7AM, please contact night-coverage 11/18/2021, 6:50 PM

## 2021-11-18 NOTE — Assessment & Plan Note (Signed)
--  pt's main complaint on presentation.  Maybe due to covid infection.  Resolved after presentation.

## 2021-11-18 NOTE — ED Notes (Signed)
Pt refused dinner but requested sherbet to intake. Pt repositioned in bed. Pt family at bedside. Vitals stable.

## 2021-11-18 NOTE — ED Notes (Signed)
Called dietary to order pt chicken noodle soup at this time

## 2021-11-18 NOTE — Assessment & Plan Note (Signed)
--  home BP held on admission due to low normal BP Plan: --cont home coreg --hold home dilt for now due to HR in 50's --start hydralazine 50 mg q8h

## 2021-11-18 NOTE — Assessment & Plan Note (Signed)
--  started on IV vanc and switched to ceftriaxone --cont ceftriaxone 2g daily

## 2021-11-18 NOTE — Assessment & Plan Note (Signed)
stable °

## 2021-11-18 NOTE — ED Notes (Signed)
Got report from night shift nurse. Pt A&Ox4. Pt given morning meds and tolerated well. Currently, resting in bed.

## 2021-11-18 NOTE — Consult Note (Signed)
GI Inpatient Consult Note  Reason for Consult: Marked transaminitis, acute hepatitis    Attending Requesting Consult: Dr. Jose Persia  History of Present Illness: Audrey Wolf is a 71 y.o. female seen for evaluation of marked transaminitis and acute hepatitis at the request of admitting hospitalist - Dr. Jose Persia. Patient has a PMH of HTN, HLD, mild intermittent asthma, prediabetes, CKD Stage IIIb, asthma, HFpEF, CAD s/p CABG 05/2017, and PAF on chronic anticoagulation with Eliquis. She presented to the [facility] ED 10/14 for chief complaint of 1-day history of fevers, chills, productive cough, generalized weakness, malaise, fatigue found to be COVID-19 PCR positive with supportive care advised. She presented back to the ED yesterday afternoon with worsening symptoms and episodes of NBNB emesis and diarrhea. Upon presentation to the ED, she was hypotensive with BP 101/56 with HR 66 and O2 sats 96% on room air. Labs significant for serum creatinine 2.12 (compared to 1.3 yesterday), BUN 33, markedly elevated serum aminotransferases with AST 6502 and ALT 3485 with total bilirubin 1.5. INR was elevated at 2.0 with PT 22. CXR negative for any pneumonia or pleural effusions or infiltrates. RUQ US showed 5 mm gallbladder polyp with no acute abnormalities. GI consulted in context of marked transaminitis/acute hepatitis.   Patient seen and examined this morning in ED room. No acute events overnight. Blood cultures overnight grew Streptococcus species and IV Ceftriaxone was started. She reports she feels the same this morning as yesterday. She has not had any further episodes of emesis. No diarrhea so far this morning. She denies any abdominal pain. She denies any history of elevated LFTs or liver failure. No known family history of chronic liver disease. She denies any new medication changes. No herbal supplements or vitamins. Her acute hepatitis panel was negative. She has been taking Tylenol 650 mg  every 4 hours over the past 2 days for fever and myalgias. Her acetaminophen level was negative in the hospital. Labs today showed AST 4973, ALT 3196, INR 3.0, and total bilirubin 1.6. She denies any jaundice, pruritus, abdominal swelling, lower extremity edema, altered mental status, hematochezia, or melena. She hasn't noticed any new skin rashes or skin lesions. UDS was negative in the ED. She denies any consumption of raw seafood or shellfish.    Past Medical History:  Past Medical History:  Diagnosis Date   Arthritis    Asthma    CHF (congestive heart failure) (HCC)    Chronic kidney disease    Coronary artery disease    GERD (gastroesophageal reflux disease)    Headache    Hypertension    Nephrolithiasis    NSTEMI (non-ST elevated myocardial infarction) (HCC)    Postprandial RUQ pain 05/25/2019    Problem List: Patient Active Problem List   Diagnosis Date Noted   COVID-19 virus infection 11/17/2021   Hypotension 11/17/2021   Acute hepatitis [id number]   Metabolic acidosis, normal anion gap (NAG) 11/17/2021   Trapezius strain 07/23/2021   Hypersomnia 07/23/2021   Atrial fibrillation ([place]) 09/19/2020   Chronic diastolic CHF (congestive heart failure) ([place]) 09/19/2020   Acute kidney injury superimposed on chronic kidney disease ([place]) 09/19/2020   Acute left-sided thoracic back pain 08/30/2020   OAB (overactive bladder) 08/30/2020   Bilateral lower extremity edema 06/01/2020   Nontraumatic tear of skin 06/01/2020   Arthritis 11/29/2019   Calculus of gallbladder without cholecystitis without obstruction 06/03/2019   Gallbladder polyp 06/03/2019   Postprandial RUQ pain 05/25/2019   Obesity (BMI 30.0-34.9) 05/17/2019  Palpitations 05/17/2019   Chronic fatigue 11/12/2018   Asthma 07/05/2018   Prediabetes 03/31/2018   Hydronephrosis with ureteral stricture, not elsewhere classified 11/17/2017   Allergic rhinitis 11/10/2017   Anemia 07/27/2017   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   CAD  (coronary artery disease), native coronary artery 05/19/2017   CHF (congestive heart failure) ([place]) 05/16/2017   Hypertension 06/05/2016   Hyperlipidemia 06/05/2016   GERD (gastroesophageal reflux disease) 06/05/2016   CKD (chronic kidney disease) stage 3, GFR 30-59 ml/min (HCC) 06/05/2016   Ureteral stricture, left 03/29/2014   Unilateral small kidney 03/02/2013   Abnormal liver enzymes 03/02/2012   Thyroid disease 05/05/2010   Epigastric pain 02/10/2010   Impaired fasting glucose 02/04/2007    Past Surgical History: Past Surgical History:  Procedure Laterality Date   ABDOMINAL HYSTERECTOMY     APPENDECTOMY     BREAST BIOPSY Left 12/31/2010   neg   CORONARY ARTERY BYPASS GRAFT N/A 05/22/2017   Procedure: CORONARY ARTERY BYPASS GRAFTING (CABG) x 2 WITH ENDOSCOPIC HARVESTING OF RIGHT SAPHENOUS VEIN;  Surgeon: Grace Isaac, MD;  Location: [facility];  Service: Open Heart Surgery;  Laterality: N/A;   LEFT HEART CATH AND CORONARY ANGIOGRAPHY N/A 05/18/2017   Procedure: LEFT HEART CATH AND CORONARY ANGIOGRAPHY;  Surgeon: Dionisio David, MD;  Location: [facility];  Service: Cardiovascular;  Laterality: N/A;   LITHOTRIPSY     TEE WITHOUT CARDIOVERSION N/A 05/22/2017   Procedure: TRANSESOPHAGEAL ECHOCARDIOGRAM (TEE);  Surgeon: Grace Isaac, MD;  Location: [facility];  Service: Open Heart Surgery;  Laterality: N/A;   TONSILLECTOMY      Allergies: Allergies  Allergen Reactions   Lisinopril Cough   Ace Inhibitors Other (See Comments)    Has been told to avoid these and any diuretics because "left kidney has shrunk up and doesn't work"    Home Medications: (Not in a hospital admission)  Home medication reconciliation was completed with the patient.   Scheduled Inpatient Medications:    apixaban  5 mg Oral BID   fluticasone furoate-vilanterol  1 puff Inhalation Daily   multivitamin with minerals  1 tablet Oral Daily   sodium chloride flush  3 mL Intravenous Q12H     Continuous Inpatient Infusions:    cefTRIAXone (ROCEPHIN)  IV      PRN Inpatient Medications:  albuterol, guaiFENesin-dextromethorphan, loperamide, ondansetron **OR** ondansetron (ZOFRAN) IV, polyethylene glycol  Family History: family history includes Arthritis in her father and mother; Breast cancer in her maternal aunt; Heart disease in her father; Stroke in her father and son; Sudden Cardiac Death in her father.  The patient's family history is negative for inflammatory bowel disorders, GI malignancy, or solid organ transplantation.  Social History:   reports that she has never smoked. She has never used smokeless tobacco. She reports that she does not drink alcohol and does not use drugs. The patient denies ETOH, tobacco, or drug use.   Review of Systems: Constitutional: Weight is stable.  Eyes: No changes in vision. ENT: No oral lesions, sore throat.  GI: see HPI.  Heme/Lymph: No easy bruising.  CV: No chest pain.  GU: No hematuria.  Integumentary: No rashes.  Neuro: No headaches.  Psych: No depression/anxiety.  Endocrine: No heat/cold intolerance.  Allergic/Immunologic: No urticaria.  Resp: No cough, SOB.  Musculoskeletal: No joint swelling.    Physical Examination: BP (!) 156/55   Pulse 64   Temp 98.3 F (36.8 C) (Oral)   Resp (!) 25   Ht 5'  9" (1.753 m)   Wt 90.7 kg   SpO2 94%   BMI 29.53 kg/m  Gen: NAD, alert and oriented x 4 HEENT: PEERLA, EOMI, Neck: supple, no JVD or thyromegaly Chest: CTA bilaterally, no wheezes, crackles, or other adventitious sounds CV: RRR, no m/g/c/r Abd: soft, NT, ND, +BS in all four quadrants; no HSM, guarding, ridigity, or rebound tenderness Ext: no edema, well perfused with 2+ pulses, Skin: no rash or lesions noted Lymph: no LAD  Data: Lab Results  Component Value Date   WBC 6.7 11/18/2021   HGB 9.3 (L) 11/18/2021   HCT 30.2 (L) 11/18/2021   MCV 82.3 11/18/2021   PLT 115 (L) 11/18/2021   Recent Labs  Lab  11/16/21 1502 11/17/21 1558 11/18/21 0419  HGB 10.7* 10.6* 9.3*   Lab Results  Component Value Date   NA 138 11/18/2021   K 3.6 11/18/2021   CL 110 11/18/2021   CO2 21 (L) 11/18/2021   BUN 33 (H) 11/18/2021   CREATININE 1.82 (H) 11/18/2021   GLU 103 04/03/2016   Lab Results  Component Value Date   ALT 3,196 (H) 11/18/2021   AST 4,973 (H) 11/18/2021   ALKPHOS 118 11/18/2021   BILITOT 1.6 (H) 11/18/2021   Recent Labs  Lab 11/17/21 1558 11/18/21 0419  APTT 45*  --   INR 2.0* 3.0*    Assessment/Plan:  71 y/o Caucasian female with a PMH of HTN, HLD, mild intermittent asthma, prediabetes, CKD Stage IIIb, asthma, HFpEF, CAD s/p CABG 05/2017, and PAF on chronic anticoagulation presented to the [facility] ED 10/14 for chief complaint of 1-day history of fevers, chills, productive cough, generalized weakness, malaise, fatigue found to be COVID-19 PCR positive with supportive care advised. She presented back to the ED yesterday afternoon with worsening symptoms and episodes of NBNB emesis and diarrhea. GI consulted in context of marked transaminitis  Marked transaminitis - clinical presentation most c/w ischemic hepatitis 2/2 hypoperfusion to liver in setting of dehydration and COVID-19 virus infection. Other differentials include consumption of toxic substance, other viral hepatitis, DILI, or rhabdomyolysis  COVID-19 virus infection  AKI on CKD Stage IIIb  Mild intermittent asthma  PAF on chronic anticoagulation  CAD with hx of CABG  Recommendations:  - Continue supportive care with IV fluids, pain control, and anti-emetics - Continue efforts at improving blood pressure  - Continue to monitor LFTs and trend them. Suspect LFTs to continue to trend down over the coming days.  - Continue to monitor INR daily and monitor for mental status changes - No signs of overt gastrointestinal bleeding  - Avoid hepatotoxins  - Continue supportive care for COVID-19 virus infection per primary  team - No indications at this time for further GI work-up - GI following along with you   Thank you for the consult. Please call with questions or concerns.  Geanie Kenning, PA-C [facility] [phone]

## 2021-11-18 NOTE — Progress Notes (Addendum)
PHARMACY - PHYSICIAN COMMUNICATION CRITICAL VALUE ALERT - BLOOD CULTURE IDENTIFICATION (BCID)  Audrey Wolf is an 71 y.o. female who presented to [facility] on 11/17/2021 with a chief complaint of hypotension, acute hepatitis.   Assessment:  Staph species in 4 of 4 bottles (include suspected source if known)  -  lab tech said Staph spec in 4 of 4 bottles, was actually strep species ,    Name of physician (or Provider) Contacted: Neomia Glass, NP   Current antibiotics: none   Changes to prescribed antibiotics recommended:  Recommendations accepted by provider  Will start vancomycin.   - Will d/c vanc and start ceftriaxone 2 gm IV Q24H   Results for orders placed or performed during the hospital encounter of 11/17/21  Blood Culture ID Panel (Reflexed) (Collected: 11/17/2021  3:58 PM)  Result Value Ref Range   Enterococcus faecalis NOT DETECTED NOT DETECTED   Enterococcus Faecium NOT DETECTED NOT DETECTED   Listeria monocytogenes NOT DETECTED NOT DETECTED   Staphylococcus species NOT DETECTED NOT DETECTED   Streptococcus species DETECTED (A) NOT DETECTED   Streptococcus agalactiae NOT DETECTED NOT DETECTED   Streptococcus pneumoniae NOT DETECTED NOT DETECTED   Streptococcus pyogenes NOT DETECTED NOT DETECTED   A.calcoaceticus-baumannii NOT DETECTED NOT DETECTED   Bacteroides fragilis NOT DETECTED NOT DETECTED   Enterobacterales NOT DETECTED NOT DETECTED   Enterobacter cloacae complex NOT DETECTED NOT DETECTED   Escherichia coli NOT DETECTED NOT DETECTED   Klebsiella aerogenes NOT DETECTED NOT DETECTED   Klebsiella oxytoca NOT DETECTED NOT DETECTED   Klebsiella pneumoniae NOT DETECTED NOT DETECTED   Proteus species NOT DETECTED NOT DETECTED   Salmonella species NOT DETECTED NOT DETECTED   Serratia marcescens NOT DETECTED NOT DETECTED   Haemophilus influenzae NOT DETECTED NOT DETECTED   Neisseria meningitidis NOT DETECTED NOT DETECTED   Pseudomonas aeruginosa NOT DETECTED  NOT DETECTED   Stenotrophomonas maltophilia NOT DETECTED NOT DETECTED   Candida albicans NOT DETECTED NOT DETECTED   Candida auris NOT DETECTED NOT DETECTED   Candida glabrata NOT DETECTED NOT DETECTED   Candida krusei NOT DETECTED NOT DETECTED   Candida parapsilosis NOT DETECTED NOT DETECTED   Candida tropicalis NOT DETECTED NOT DETECTED   Cryptococcus neoformans/gattii NOT DETECTED NOT DETECTED    Colleene Swarthout D 11/18/2021  4:16 AM

## 2021-11-18 NOTE — Progress Notes (Signed)
Pharmacy Antibiotic Note  Audrey Wolf is a 71 y.o. female admitted on 11/17/2021 with bacteremia.  Pharmacy has been consulted for Vancomycin dosing. Staph species growing in 4 of 4 bottles.   Plan: Vancomycin 2250 mg IV X 1 ordered for 10/16 @ ~ 0500.   Vanomcyin 1 gm IV Q24H ordered to start on 10/17 @ 0500.   AUC = 495.1  Vanc trough = 14.4 mcg/ml  Height: 5\' 9"  (175.3 cm) Weight: 90.7 kg (200 lb) IBW/kg (Calculated) : 66.2  Temp (24hrs), Avg:98.6 F (37 C), Min:98.2 F (36.8 C), Max:99 F (37.2 C)  Recent Labs  Lab 11/16/21 1502 11/17/21 1558 11/17/21 1748  WBC 8.8 6.4  --   CREATININE 1.39* 2.12*  --   LATICACIDVEN  --  3.1* 2.0*    Estimated Creatinine Clearance: 29.2 mL/min (A) (by C-G formula based on SCr of 2.12 mg/dL (H)).    Allergies  Allergen Reactions   Lisinopril Cough   Ace Inhibitors Other (See Comments)    Has been told to avoid these and any diuretics because "left kidney has shrunk up and doesn't work"    Antimicrobials this admission:   >>    >>   Dose adjustments this admission:   Microbiology results:  BCx:   UCx:    Sputum:    MRSA PCR:   Thank you for allowing pharmacy to be a part of this patient's care.  Suhas Estis D 11/18/2021 4:18 AM

## 2021-11-18 NOTE — ED Notes (Signed)
Pt given morning meds. Tolerated well.

## 2021-11-18 NOTE — ED Notes (Signed)
Family at bedside at this time. Pt resting comfortably

## 2021-11-18 NOTE — Progress Notes (Signed)
       CROSS COVER NOTE  NAME: Audrey Wolf MRN: [id number] DOB : 21-Mar-1950    Date of Service   11/18/2021   HPI/Events of Note   Contacted by pharmacist with BCID results--> Staph growing in 4/4 Bottles.  0532: On review of chart blood cultures show growth of Streptococcus, not Staph.  Interventions   Assessment/Plan:  Vancomycin per pharmacy  Pharmacy contacted, will d/c Vancomycin and start Ceftriaxone     This document was prepared using Dragon voice recognition software and may include unintentional dictation errors.  Neomia Glass DNP, MBA, FNP-BC Nurse Practitioner [facility] Pager [phone]

## 2021-11-19 ENCOUNTER — Other Ambulatory Visit: Payer: Self-pay

## 2021-11-19 ENCOUNTER — Encounter: Payer: Self-pay | Admitting: Hospitalist

## 2021-11-19 DIAGNOSIS — R112 Nausea with vomiting, unspecified: Secondary | ICD-10-CM | POA: Diagnosis not present

## 2021-11-19 LAB — CBC WITH DIFFERENTIAL/PLATELET
Abs Immature Granulocytes: 0.01 10*3/uL (ref 0.00–0.07)
Basophils Absolute: 0 10*3/uL (ref 0.0–0.1)
Basophils Relative: 0 %
Eosinophils Absolute: 0 10*3/uL (ref 0.0–0.5)
Eosinophils Relative: 0 %
HCT: 28.7 % — ABNORMAL LOW (ref 36.0–46.0)
Hemoglobin: 8.9 g/dL — ABNORMAL LOW (ref 12.0–15.0)
Immature Granulocytes: 0 %
Lymphocytes Relative: 14 %
Lymphs Abs: 0.6 10*3/uL — ABNORMAL LOW (ref 0.7–4.0)
MCH: 25.3 pg — ABNORMAL LOW (ref 26.0–34.0)
MCHC: 31 g/dL (ref 30.0–36.0)
MCV: 81.5 fL (ref 80.0–100.0)
Monocytes Absolute: 0.2 10*3/uL (ref 0.1–1.0)
Monocytes Relative: 5 %
Neutro Abs: 3.5 10*3/uL (ref 1.7–7.7)
Neutrophils Relative %: 81 %
Platelets: 107 10*3/uL — ABNORMAL LOW (ref 150–400)
RBC: 3.52 MIL/uL — ABNORMAL LOW (ref 3.87–5.11)
RDW: 15 % (ref 11.5–15.5)
WBC: 4.3 10*3/uL (ref 4.0–10.5)
nRBC: 0 % (ref 0.0–0.2)

## 2021-11-19 LAB — BLOOD CULTURE ID PANEL (REFLEXED) - BCID2
A.calcoaceticus-baumannii: NOT DETECTED
Bacteroides fragilis: NOT DETECTED
Candida albicans: NOT DETECTED
Candida auris: NOT DETECTED
Candida glabrata: NOT DETECTED
Candida krusei: NOT DETECTED
Candida parapsilosis: NOT DETECTED
Candida tropicalis: NOT DETECTED
Cryptococcus neoformans/gattii: NOT DETECTED
Enterobacter cloacae complex: NOT DETECTED
Enterobacterales: NOT DETECTED
Enterococcus Faecium: NOT DETECTED
Enterococcus faecalis: NOT DETECTED
Escherichia coli: NOT DETECTED
Haemophilus influenzae: NOT DETECTED
Klebsiella aerogenes: NOT DETECTED
Klebsiella oxytoca: NOT DETECTED
Klebsiella pneumoniae: NOT DETECTED
Listeria monocytogenes: NOT DETECTED
Neisseria meningitidis: NOT DETECTED
Proteus species: NOT DETECTED
Pseudomonas aeruginosa: NOT DETECTED
Salmonella species: NOT DETECTED
Serratia marcescens: NOT DETECTED
Staphylococcus species: NOT DETECTED
Stenotrophomonas maltophilia: NOT DETECTED
Streptococcus agalactiae: NOT DETECTED
Streptococcus pneumoniae: NOT DETECTED
Streptococcus pyogenes: NOT DETECTED
Streptococcus species: DETECTED — AB

## 2021-11-19 LAB — MAGNESIUM: Magnesium: 2 mg/dL (ref 1.7–2.4)

## 2021-11-19 LAB — C-REACTIVE PROTEIN: CRP: 7.9 mg/dL — ABNORMAL HIGH (ref <1.0)

## 2021-11-19 LAB — COMPREHENSIVE METABOLIC PANEL
ALT: 1994 U/L — ABNORMAL HIGH (ref 0–44)
AST: 2046 U/L — ABNORMAL HIGH (ref 15–41)
Albumin: 2.8 g/dL — ABNORMAL LOW (ref 3.5–5.0)
Alkaline Phosphatase: 118 U/L (ref 38–126)
Anion gap: 7 (ref 5–15)
BUN: 26 mg/dL — ABNORMAL HIGH (ref 8–23)
CO2: 21 mmol/L — ABNORMAL LOW (ref 22–32)
Calcium: 8 mg/dL — ABNORMAL LOW (ref 8.9–10.3)
Chloride: 111 mmol/L (ref 98–111)
Creatinine, Ser: 1.18 mg/dL — ABNORMAL HIGH (ref 0.44–1.00)
GFR, Estimated: 49 mL/min — ABNORMAL LOW (ref >60)
Glucose, Bld: 95 mg/dL (ref 70–99)
Potassium: 3.5 mmol/L (ref 3.5–5.1)
Sodium: 139 mmol/L (ref 135–145)
Total Bilirubin: 1.4 mg/dL — ABNORMAL HIGH (ref 0.3–1.2)
Total Protein: 5.1 g/dL — ABNORMAL LOW (ref 6.5–8.1)

## 2021-11-19 MED ORDER — HYDRALAZINE HCL 50 MG PO TABS
50.0000 mg | ORAL_TABLET | Freq: Three times a day (TID) | ORAL | Status: DC
  Administered 2021-11-19 – 2021-11-22 (×8): 50 mg via ORAL
  Filled 2021-11-19 (×8): qty 1

## 2021-11-19 NOTE — Progress Notes (Signed)
PROGRESS NOTE    NIKAYLA LITALIEN  [id number] DOB: 14-Dec-1950 DOA: 11/17/2021 PCP: Leone Haven, MD  126A/126A-AA  LOS: 1 day   Brief hospital course:   Assessment & Plan: DEANNIE PARREIRA is a 71 y.o. female with medical history significant of CAD s/p CABG, HFpEF, hypertension, asthma, CKD stage IIIb who presents to the ED with complaints of nausea and vomiting.   Pt began feeling ill on 10/13 with fever, chills, productive cough, generalized weakness, malaise and fatigue.  She went to the ED on 10/14, and was dx with covid and discharged home.  After returning home she continued to feel poor, and notes dizziness when attempting to get out of her car and she fell.    * Nausea & vomiting --pt's main complaint on presentation.  Maybe due to covid infection.  Resolved after presentation.  Bacteremia due to Streptococcus --started on IV vanc and switched to ceftriaxone --cont ceftriaxone 2g daily pending cx results --repeat blood cx tomorrow  Acute hepatitis --AST 6000's, ALT 3000's on presentation.  GI consulted, clinical presentation most c/w ischemic hepatitis 2/2 hypoperfusion to liver in setting of dehydration and COVID-19 virus infection.   --hepatitis panel neg.  No recent abx use. 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  Presenting with creatinine of 2.12, previously 1.39 the day prior.  Likely in the setting of hypovolemia due to ongoing emesis and diarrhea. --s/p IVF and MIVF with improvement in Cr to 1.18 today Plan: --hold further MIVF and encourage oral hydration  COVID-19 virus infection Patient presenting with 3 days of fever, malaise, cough, N/V/D, and generalized weakness.  No respiratory symptoms at this time no evidence of pneumonia on chest x-ray.   Discussed with pharmacy; due to AKI and acute hepatitis, patient is not a candidate for any antivirals.  No indication for Decadron at this time.   --Supportive management only.   Asthma No evidence of wheezing on examination and patient denied shortness of breath.  -Continue home bronchodilators  Paroxysmal atrial fibrillation (HCC) Sinus rhythm on EKG. Plan: --cont Eliquis - Holding home amiodarone due to acute hepatitis --cont home coreg - Holding home diltiazem for now due to HR in 50's  CAD (coronary artery disease), native coronary artery - Holding home statin due to acute hepatitis  Metabolic acidosis, normal anion gap (NAG) In the setting of vomiting and diarrhea.  No indication for bicarb therapy at this time.  Chronic diastolic CHF (congestive heart failure) (HCC) --stable.    Hypertension --home BP held on admission due to low normal BP Plan: --cont home coreg --hold home dilt for now due to HR in 50's --start hydralazine 50 mg q8h   DVT prophylaxis: [id number] Code Status: Full code  Family Communication:  Level of care: Med-Surg Dispo:   The patient is from: home Anticipated d/c is to: home Anticipated d/c date is: 2-3 days   Subjective and Interval History:  Pt reported her diarrhea returned, and complained of abdominal discomfort and no appetite.   Objective: Vitals:   11/19/21 0916 11/19/21 1118 11/19/21 1231 11/19/21 1511  BP: (!) 174/54 (!) 168/49 (!) 181/59 (!) 152/54  Pulse: (!) 59 (!) 53 (!) 55 (!) 57  Resp: 16 19 17 19   Temp: 98.3 F (36.8 C) 98.4 F (36.9 C) 98.3 F (36.8  C) 98.9 F (37.2 C)  TempSrc: Oral Oral    SpO2: 99% 100% 100% 99%  Weight:      Height:        Intake/Output Summary (Last 24 hours) at 11/19/2021 1831 Last data filed at 11/19/2021 1506 Gross per 24 hour  Intake 833.45 ml  Output --  Net 833.45 ml   Filed Weights   11/17/21 1554 11/18/21 0517  Weight: 90.7 kg 90.7 kg    Examination:    Constitutional: NAD, AAOx3 HEENT: conjunctivae and lids normal, EOMI CV: No cyanosis.   RESP: normal respiratory effort, on RA Neuro: II - XII grossly intact.   Psych: depressed mood and affect.     Data Reviewed: I have personally reviewed labs and imaging studies  Time spent: 35 minutes  Enzo Bi, MD [facility] If 7PM-7AM, please contact night-coverage 11/19/2021, 6:31 PM

## 2021-11-19 NOTE — Progress Notes (Signed)
[facility] Inpatient Progress Note    Subjective: Patient seen for follow up ischemic hepatitis. Patient in no pain. C/o mild fatigue.   Objective: Vital signs in last 24 hours: Temp:  [98.3 F (36.8 C)-98.9 F (37.2 C)] 98.9 F (37.2 C) (10/17 1511) Pulse Rate:  [53-70] 57 (10/17 1511) Resp:  [16-20] 19 (10/17 1511) BP: (147-187)/(49-77) 152/54 (10/17 1511) SpO2:  [99 %-100 %] 99 % (10/17 1511) Blood pressure (!) 152/54, pulse (!) 57, temperature 98.9 F (37.2 C), resp. rate 19, height 5\' 9"  (1.753 m), weight 90.7 kg, SpO2 99 %.    Intake/Output from previous day: 10/16 0701 - 10/17 0700 In: 1705.5 [I.V.:1410.7; IV Piggyback:294.8] Out: -   Intake/Output this shift: Total I/O In: 432.8 [I.V.:432.8] Out: -    Gen: NAD. Appears comfortable.  HEENT: [place]/AT. PERRLA. Normal external ear exam.  Chest: CTA, no wheezes.  CV: RR nl S1, S2. No gallops.  Abd: soft, nt, nd. BS+  Ext: no edema. Pulses 2+  Neuro: Alert and oriented. Judgement appears normal. Nonfocal.   Lab Results: Results for orders placed or performed during the hospital encounter of 11/17/21 (from the past 24 hour(s))  CBC with Differential/Platelet     Status: Abnormal   Collection Time: 11/19/21  4:42 AM  Result Value Ref Range   WBC 4.3 4.0 - 10.5 K/uL   RBC 3.52 (L) 3.87 - 5.11 MIL/uL   Hemoglobin 8.9 (L) 12.0 - 15.0 g/dL   HCT 28.7 (L) 36.0 - 46.0 %   MCV 81.5 80.0 - 100.0 fL   MCH 25.3 (L) 26.0 - 34.0 pg   MCHC 31.0 30.0 - 36.0 g/dL   RDW 15.0 11.5 - 15.5 %   Platelets 107 (L) 150 - 400 K/uL   nRBC 0.0 0.0 - 0.2 %   Neutrophils Relative % 81 %   Neutro Abs 3.5 1.7 - 7.7 K/uL   Lymphocytes Relative 14 %   Lymphs Abs 0.6 (L) 0.7 - 4.0 K/uL   Monocytes Relative 5 %   Monocytes Absolute 0.2 0.1 - 1.0 K/uL   Eosinophils Relative 0 %   Eosinophils Absolute 0.0 0.0 - 0.5 K/uL   Basophils Relative 0 %   Basophils Absolute 0.0 0.0 - 0.1 K/uL   Immature Granulocytes 0 %    Abs Immature Granulocytes 0.01 0.00 - 0.07 K/uL  Comprehensive metabolic panel     Status: Abnormal   Collection Time: 11/19/21  4:42 AM  Result Value Ref Range   Sodium 139 135 - 145 mmol/L   Potassium 3.5 3.5 - 5.1 mmol/L   Chloride 111 98 - 111 mmol/L   CO2 21 (L) 22 - 32 mmol/L   Glucose, Bld 95 70 - 99 mg/dL   BUN 26 (H) 8 - 23 mg/dL   Creatinine, Ser 1.18 (H) 0.44 - 1.00 mg/dL   Calcium 8.0 (L) 8.9 - 10.3 mg/dL   Total Protein 5.1 (L) 6.5 - 8.1 g/dL   Albumin 2.8 (L) 3.5 - 5.0 g/dL   AST 2,046 (H) 15 - 41 U/L   ALT 1,994 (H) 0 - 44 U/L   Alkaline Phosphatase 118 38 - 126 U/L   Total Bilirubin 1.4 (H) 0.3 - 1.2 mg/dL   GFR, Estimated 49 (L) >60 mL/min   Anion gap 7 5 - 15  C-reactive protein     Status: Abnormal   Collection Time: 11/19/21  4:42 AM  Result Value Ref Range   CRP 7.9 (H) <1.0 mg/dL  Magnesium  Status: None   Collection Time: 11/19/21  4:42 AM  Result Value Ref Range   Magnesium 2.0 1.7 - 2.4 mg/dL     Recent Labs    94/58/59 1558 11/18/21 0419 11/19/21 0442  WBC 6.4 6.7 4.3  HGB 10.6* 9.3* 8.9*  HCT 33.5* 30.2* 28.7*  PLT 138* 115* 107*   BMET Recent Labs    11/17/21 1558 11/18/21 0419 11/19/21 0442  NA 136 138 139  K 3.3* 3.6 3.5  CL 108 110 111  CO2 18* 21* 21*  GLUCOSE 119* 99 95  BUN 33* 33* 26*  CREATININE 2.12* 1.82* 1.18*  CALCIUM 8.0* 7.8* 8.0*   LFT Recent Labs    11/19/21 0442  PROT 5.1*  ALBUMIN 2.8*  AST 2,046*  ALT 1,994*  ALKPHOS 118  BILITOT 1.4*   PT/INR Recent Labs    11/17/21 1558 11/18/21 0419  LABPROT 22.3* 30.6*  INR 2.0* 3.0*   Hepatitis Panel Recent Labs    11/17/21 2110  HEPBSAG NON REACTIVE  HCVAB NON REACTIVE  HEPAIGM NON REACTIVE  HEPBIGM NON REACTIVE   C-Diff No results for input(s): "CDIFFTOX" in the last 72 hours. No results for input(s): "CDIFFPCR" in the last 72 hours.   Studies/Results: US ABDOMEN LIMITED RUQ (LIVER/GB)  Result Date: 11/17/2021 CLINICAL DATA:   71 year old female with abdominal pain and elevated LFTs. EXAM: ULTRASOUND ABDOMEN LIMITED RIGHT UPPER QUADRANT COMPARISON:  None Available. FINDINGS: Gallbladder: A 5 mm polyp is noted. There is no evidence cholelithiasis or acute cholecystitis. Common bile duct: Diameter: 5.8 mm. There is no evidence of intrahepatic or extrahepatic biliary dilatation. Liver: Slightly increased hepatic echogenicity noted. No other abnormalities are present. Portal vein is patent on color Doppler imaging with normal direction of blood flow towards the liver. Other: None. IMPRESSION: 1. No acute abnormality. 2. Slightly increased hepatic echogenicity which may represent mild hepatic steatosis. 3. 5 mm gallbladder polyp. No evidence of cholelithiasis or acute cholecystitis. Electronically Signed   By: Harmon Pier M.D.   On: 11/17/2021 18:38    Scheduled Inpatient Medications:    apixaban  5 mg Oral BID   carvedilol  25 mg Oral BID WC   fluticasone furoate-vilanterol  1 puff Inhalation Daily   multivitamin with minerals  1 tablet Oral Daily   sodium chloride flush  3 mL Intravenous Q12H    Continuous Inpatient Infusions:    cefTRIAXone (ROCEPHIN)  IV Stopped (11/19/21 1121)    PRN Inpatient Medications:  albuterol, guaiFENesin-dextromethorphan, hydrALAZINE, loperamide, ondansetron **OR** ondansetron (ZOFRAN) IV, polyethylene glycol  Miscellaneous: N/A  Assessment:  Shock liver/ischemic hepatitis - LAE's improving with rehydration and BP support. Nausea and vomiting - stable. Diastolic CHF  Plan:  Continue trending LAE's. GI will sign off for now. Call back if we can help.  Tyreonna Czaplicki K. Norma Fredrickson, M.D. 11/19/2021, 6:23 PM

## 2021-11-20 DIAGNOSIS — N189 Chronic kidney disease, unspecified: Secondary | ICD-10-CM | POA: Diagnosis not present

## 2021-11-20 DIAGNOSIS — R7881 Bacteremia: Secondary | ICD-10-CM | POA: Diagnosis not present

## 2021-11-20 DIAGNOSIS — R112 Nausea with vomiting, unspecified: Secondary | ICD-10-CM | POA: Diagnosis not present

## 2021-11-20 DIAGNOSIS — B179 Acute viral hepatitis, unspecified: Secondary | ICD-10-CM | POA: Diagnosis not present

## 2021-11-20 DIAGNOSIS — N179 Acute kidney failure, unspecified: Secondary | ICD-10-CM | POA: Diagnosis not present

## 2021-11-20 LAB — CULTURE, BLOOD (ROUTINE X 2)
Special Requests: ADEQUATE
Special Requests: ADEQUATE

## 2021-11-20 LAB — COMPREHENSIVE METABOLIC PANEL
ALT: 1292 U/L — ABNORMAL HIGH (ref 0–44)
AST: 872 U/L — ABNORMAL HIGH (ref 15–41)
Albumin: 2.7 g/dL — ABNORMAL LOW (ref 3.5–5.0)
Alkaline Phosphatase: 113 U/L (ref 38–126)
Anion gap: 9 (ref 5–15)
BUN: 24 mg/dL — ABNORMAL HIGH (ref 8–23)
CO2: 24 mmol/L (ref 22–32)
Calcium: 8 mg/dL — ABNORMAL LOW (ref 8.9–10.3)
Chloride: 107 mmol/L (ref 98–111)
Creatinine, Ser: 1.14 mg/dL — ABNORMAL HIGH (ref 0.44–1.00)
GFR, Estimated: 51 mL/min — ABNORMAL LOW (ref >60)
Glucose, Bld: 86 mg/dL (ref 70–99)
Potassium: 3.3 mmol/L — ABNORMAL LOW (ref 3.5–5.1)
Sodium: 140 mmol/L (ref 135–145)
Total Bilirubin: 1.1 mg/dL (ref 0.3–1.2)
Total Protein: 5 g/dL — ABNORMAL LOW (ref 6.5–8.1)

## 2021-11-20 LAB — CBC WITH DIFFERENTIAL/PLATELET
Abs Immature Granulocytes: 0.03 10*3/uL (ref 0.00–0.07)
Basophils Absolute: 0 10*3/uL (ref 0.0–0.1)
Basophils Relative: 0 %
Eosinophils Absolute: 0 10*3/uL (ref 0.0–0.5)
Eosinophils Relative: 0 %
HCT: 26.4 % — ABNORMAL LOW (ref 36.0–46.0)
Hemoglobin: 8.3 g/dL — ABNORMAL LOW (ref 12.0–15.0)
Immature Granulocytes: 1 %
Lymphocytes Relative: 23 %
Lymphs Abs: 0.6 10*3/uL — ABNORMAL LOW (ref 0.7–4.0)
MCH: 25.4 pg — ABNORMAL LOW (ref 26.0–34.0)
MCHC: 31.4 g/dL (ref 30.0–36.0)
MCV: 80.7 fL (ref 80.0–100.0)
Monocytes Absolute: 0.3 10*3/uL (ref 0.1–1.0)
Monocytes Relative: 11 %
Neutro Abs: 1.9 10*3/uL (ref 1.7–7.7)
Neutrophils Relative %: 65 %
Platelets: 115 10*3/uL — ABNORMAL LOW (ref 150–400)
RBC: 3.27 MIL/uL — ABNORMAL LOW (ref 3.87–5.11)
RDW: 15.1 % (ref 11.5–15.5)
WBC: 2.8 10*3/uL — ABNORMAL LOW (ref 4.0–10.5)
nRBC: 0 % (ref 0.0–0.2)

## 2021-11-20 LAB — MAGNESIUM: Magnesium: 2.3 mg/dL (ref 1.7–2.4)

## 2021-11-20 LAB — C-REACTIVE PROTEIN: CRP: 4.2 mg/dL — ABNORMAL HIGH (ref <1.0)

## 2021-11-20 MED ORDER — LIDOCAINE 5 % EX PTCH
2.0000 | MEDICATED_PATCH | CUTANEOUS | Status: DC
  Administered 2021-11-20 – 2021-11-21 (×2): 2 via TRANSDERMAL
  Filled 2021-11-20 (×2): qty 2

## 2021-11-20 NOTE — TOC CM/SW Note (Signed)
  Transition of Care [facility]) Screening Note   Patient Details  Name: Audrey Wolf Date of Birth: [birth year]   Transition of Care [facility]) CM/SW Contact:    Colen Darling, [place] Phone Number: 11/20/2021, 4:15 PM    Transition of Care Department [facility]) has reviewed patient and no TOC needs have been identified at this time. We will continue to monitor patient advancement through interdisciplinary progression rounds. If new patient transition needs arise, please place a TOC consult.

## 2021-11-20 NOTE — Consult Note (Signed)
NAME: Audrey Wolf  DOB: 06-Apr-1950  MRN: [id number]  Date/Time: 11/20/2021 2:27 PM  REQUESTING PROVIDER: Dr.Patel Subjective:  REASON FOR CONSULT: strep viridans bacteremia ? Audrey Wolf is a 71 y.o. 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 managed conservatively by urology Presented to ED on 10/14 with headache, bodyache and cough/SOB  of 2 days Also had fever at home In the ED vitals   BP 11/16/21 1456 134/73     Pulse Rate 11/16/21 1456 72     Resp 11/16/21 1456 18     Temp 11/16/21 1456 (!) 101.3 F (38.5 C)     Temp Source 11/16/21 1456 Oral     SpO2 11/16/21 1456 96 %     Latest Reference Range & Units 11/16/21  WBC 4.0 - 10.5 K/uL 8.8  Hemoglobin 12.0 - 15.0 g/dL 40.9 (L)  HCT 81.1 - 91.4 % 34.9 (L)  Platelets 150 - 400 K/uL 189  Creatinine 0.44 - 1.00 mg/dL 7.82 (H)  She was sent home from ED Came back on 10/15 as she was having diarrhea, weakness   11/17/21  BP 187/64 !  Temp 98.7 F (37.1 C)  Pulse Rate 66  Resp 23 !  SpO2 96 %  Weight 200 lb   Labs  Latest Reference Range & Units 11/17/21  WBC 4.0 - 10.5 K/uL 6.4  Hemoglobin 12.0 - 15.0 g/dL 95.6 (L)  HCT 21.3 - 08.6 % 33.5 (L)  Platelets 150 - 400 K/uL 138 (L)  Creatinine 0.44 - 1.00 mg/dL 5.78 (H)  Covid positive AKi Lfts were abnormal AST 6502 ALT 3485 She denied any alcohol Took tylenol a day since she got sick Works at [employer]- went to work last Thursday and has bene sick since friday  she came to the ED on 07/30/21 with oral bleeding secondary to broken tooth and eliquis  I am seeing her strep viridans bacteremia  Past Medical History:  Diagnosis Date   Arthritis    Asthma    CHF (congestive heart failure) (HCC)    Chronic kidney disease    Coronary artery disease    GERD (gastroesophageal reflux disease)    Headache    Hypertension    Nephrolithiasis     NSTEMI (non-ST elevated myocardial infarction) (HCC)    Postprandial RUQ pain 05/25/2019    Past Surgical History:  Procedure Laterality Date   ABDOMINAL HYSTERECTOMY     APPENDECTOMY     BREAST BIOPSY Left 12/31/2010   neg   CORONARY ARTERY BYPASS GRAFT N/A 05/22/2017   Procedure: CORONARY ARTERY BYPASS GRAFTING (CABG) x 2 WITH ENDOSCOPIC HARVESTING OF RIGHT SAPHENOUS VEIN;  Surgeon: Delight Ovens, MD;  Location: [facility] OR;  Service: Open Heart Surgery;  Laterality: N/A;   LEFT HEART CATH AND CORONARY ANGIOGRAPHY N/A 05/18/2017   Procedure: LEFT HEART CATH AND CORONARY ANGIOGRAPHY;  Surgeon: Laurier Nancy, MD;  Location: [facility] INVASIVE CV LAB;  Service: Cardiovascular;  Laterality: N/A;   LITHOTRIPSY     TEE WITHOUT CARDIOVERSION N/A 05/22/2017   Procedure: TRANSESOPHAGEAL ECHOCARDIOGRAM (TEE);  Surgeon: Delight Ovens, MD;  Location: [facility] OR;  Service: Open Heart Surgery;  Laterality: N/A;   TONSILLECTOMY      Social History   Socioeconomic History   Marital status: Single    Spouse name: Not on file   Number of children: Not on file  Years of education: Not on file   Highest education level: Not on file  Occupational History    Employer: [employer]  Tobacco Use   Smoking status: Never   Smokeless tobacco: Never  Vaping Use   Vaping Use: Never used  Substance and Sexual Activity   Alcohol use: No   Drug use: No   Sexual activity: Not Currently    Partners: Male    Birth control/protection: Post-menopausal  Other Topics Concern   Not on file  Social History Narrative   Not on file   Social Determinants of Health   Financial Resource Strain: Low Risk  (10/03/2021)   Overall Financial Resource Strain (CARDIA)    Difficulty of Paying Living Expenses: Not hard at all  Food Insecurity: No Food Insecurity (11/19/2021)   Hunger Vital Sign    Worried About Running Out of Food in the Last Year: Never true    Ran Out of Food in the Last Year: Never true  Transportation  Needs: No Transportation Needs (11/19/2021)   PRAPARE - Administrator, Civil ServiceTransportation    Lack of Transportation (Medical): No    Lack of Transportation (Non-Medical): No  Physical Activity: Sufficiently Active (10/03/2021)   Exercise Vital Sign    Days of Exercise per Week: 5 days    Minutes of Exercise per Session: 30 min  Stress: No Stress Concern Present (10/03/2021)   [employer] - Occupational Stress Questionnaire    Feeling of Stress : Only a little  Social Connections: Unknown (10/03/2021)   Social Connection and Isolation Panel [NHANES]    Frequency of Communication with Friends and Family: More than three times a week    Frequency of Social Gatherings with Friends and Family: More than three times a week    Attends Religious Services: Not on file    Active Member of Clubs or Organizations: Not on file    Attends BankerClub or Organization Meetings: Not on file    Marital Status: Not on file  Intimate Partner Violence: Not At Risk (11/19/2021)   Humiliation, Afraid, Rape, and Kick questionnaire    Fear of Current or Ex-Partner: No    Emotionally Abused: No    Physically Abused: No    Sexually Abused: No    Family History  Problem Relation Age of Onset   Arthritis Mother    Arthritis Father    Heart disease Father    Stroke Father    Sudden Cardiac Death Father    Stroke Son    Breast cancer Maternal Aunt    Allergies  Allergen Reactions   Lisinopril Cough   Ace Inhibitors Other (See Comments)    Has been told to avoid these and any diuretics because "left kidney has shrunk up and doesn't work"   I? Current Facility-Administered Medications  Medication Dose Route Frequency Provider Last Rate Last Admin   albuterol (VENTOLIN HFA) 108 (90 Base) MCG/ACT inhaler 1-2 puff  1-2 puff Inhalation Q4H PRN Verdene LennertBasaraba, Iulia, MD       apixaban (ELIQUIS) tablet 5 mg  5 mg Oral BID Verdene LennertBasaraba, Iulia, MD   5 mg at 11/20/21 1020   carvedilol (COREG) tablet 25 mg  25 mg Oral BID  WC Darlin PriestlyLai, Tina, MD   25 mg at 11/20/21 0844   cefTRIAXone (ROCEPHIN) 2 g in sodium chloride 0.9 % 100 mL IVPB  2 g Intravenous Q24H Verdene LennertBasaraba, Iulia, MD 200 mL/hr at 11/20/21 1023 2 g at 11/20/21 1023   fluticasone furoate-vilanterol (BREO ELLIPTA)  100-25 MCG/ACT 1 puff  1 puff Inhalation Daily Jose Persia, MD   1 puff at 11/20/21 0845   guaiFENesin-dextromethorphan (ROBITUSSIN DM) 100-10 MG/5ML syrup 10 mL  10 mL Oral Q4H PRN Jose Persia, MD   10 mL at 11/19/21 1247   hydrALAZINE (APRESOLINE) injection 10 mg  10 mg Intravenous Q6H PRN Enzo Bi, MD   10 mg at 11/19/21 1247   hydrALAZINE (APRESOLINE) tablet 50 mg  50 mg Oral Q8H Enzo Bi, MD   50 mg at 11/20/21 1411   loperamide (IMODIUM) capsule 2 mg  2 mg Oral PRN Jose Persia, MD   2 mg at 11/19/21 4332   multivitamin with minerals tablet 1 tablet  1 tablet Oral Daily Jose Persia, MD   1 tablet at 11/20/21 1020   ondansetron (ZOFRAN) tablet 4 mg  4 mg Oral Q6H PRN Jose Persia, MD       Or   ondansetron (ZOFRAN) injection 4 mg  4 mg Intravenous Q6H PRN Jose Persia, MD   4 mg at 11/18/21 0529   polyethylene glycol (MIRALAX / GLYCOLAX) packet 17 g  17 g Oral Daily PRN Jose Persia, MD       sodium chloride flush (NS) 0.9 % injection 3 mL  3 mL Intravenous Q12H Jose Persia, MD   3 mL at 11/20/21 1022     Abtx:  Anti-infectives (From admission, onward)    Start     Dose/Rate Route Frequency Ordered Stop   11/19/21 0500  vancomycin (VANCOCIN) IVPB 1000 mg/200 mL premix  Status:  Discontinued        1,000 mg 200 mL/hr over 60 Minutes Intravenous Every 24 hours 11/18/21 0416 11/18/21 0540   11/18/21 1000  cefTRIAXone (ROCEPHIN) 2 g in sodium chloride 0.9 % 100 mL IVPB        2 g 200 mL/hr over 30 Minutes Intravenous Every 24 hours 11/18/21 0538     11/18/21 0415  vancomycin (VANCOCIN) IVPB 1000 mg/200 mL premix       See Hyperspace for full Linked Orders Report.   1,000 mg 200 mL/hr over 60 Minutes Intravenous  Once  11/18/21 0411 11/18/21 0710   11/18/21 0415  vancomycin (VANCOREADY) IVPB 1250 mg/250 mL  Status:  Discontinued       See Hyperspace for full Linked Orders Report.   1,250 mg 166.7 mL/hr over 90 Minutes Intravenous  Once 11/18/21 0411 11/18/21 0540       REVIEW OF SYSTEMS:  Const:  fever, chills, negative weight loss Eyes: negative diplopia or visual changes, negative eye pain ENT:  coryza, negative sore throat Resp:  cough, , dyspnea Cards: negative for chest pain, palpitations, lower extremity edema GU: says she has only one kidney functioning which is rt- left is atrophic and has left hydronephrosis since 2014 due to an obstructed stone GI: Negative for abdominal pain, had diarrhea, no bleeding, constipation Skin: negative for rash and pruritus Heme: negative for easy bruising and gum/nose bleeding RJ:JOACZYS weakness Neurolo: headaches, no dizziness, vertigo, memory problems  Psych:  anxiety, depression  Endocrine: negative for thyroid, diabetes Allergy/Immunology- negative for any medication or food allergies  Objective:  VITALS:  BP 122/78 (BP Location: Right Arm)   Pulse (!) 56   Temp 98.5 F (36.9 C)   Resp 16   Ht 5\' 9"  (1.753 m)   Wt 90.7 kg   SpO2 100%   BMI 29.53 kg/m   PHYSICAL EXAM:  General: Alert, cooperative, some  distress, appears ill  Head: Normocephalic, without obvious abnormality, atraumatic. Eyes: Conjunctivae clear, anicteric sclerae. Pupils are equal ENT Nares normal. No drainage or sinus tenderness. Lips, mucosa, and tongue normal. No Thrush Very poor dentition Neck: Supple, symmetrical, no adenopathy, thyroid: non tender no carotid bruit and no JVD. Back: No CVA tenderness. Lungs: b/l air entry Basal crepts Heart: irregular Abdomen: Soft, non-tender,not distended. Bowel sounds normal. No masses Extremities: atraumatic, no cyanosis. No edema. No clubbing Skin: No rashes or lesions. Or bruising Lymph: Cervical, supraclavicular  normal. Neurologic: Grossly non-focal Pertinent Labs Lab Results    Latest Ref Rng & Units 11/20/2021    3:53 AM 11/19/2021    4:42 AM 11/18/2021    4:19 AM  CBC  WBC 4.0 - 10.5 K/uL 2.8  4.3  6.7   Hemoglobin 12.0 - 15.0 g/dL 8.3  8.9  9.3   Hematocrit 36.0 - 46.0 % 26.4  28.7  30.2   Platelets 150 - 400 K/uL 115  107  115         Latest Ref Rng & Units 11/20/2021    3:53 AM 11/19/2021    4:42 AM 11/18/2021    4:19 AM  CMP  Glucose 70 - 99 mg/dL 86  95  99   BUN 8 - 23 mg/dL 24  26  33   Creatinine 0.44 - 1.00 mg/dL 6.59  9.35  7.01   Sodium 135 - 145 mmol/L 140  139  138   Potassium 3.5 - 5.1 mmol/L 3.3  3.5  3.6   Chloride 98 - 111 mmol/L 107  111  110   CO2 22 - 32 mmol/L 24  21  21    Calcium 8.9 - 10.3 mg/dL 8.0  8.0  7.8   Total Protein 6.5 - 8.1 g/dL 5.0  5.1  5.7   Total Bilirubin 0.3 - 1.2 mg/dL 1.1  1.4  1.6   Alkaline Phos 38 - 126 U/L 113  118  118   AST 15 - 41 U/L 872  2,046  4,973   ALT 0 - 44 U/L 1,292  1,994  3,196       Microbiology: Recent Results (from the past 240 hour(s))  Resp Panel by RT-PCR (Flu A&B, Covid) Anterior Nasal Swab     Status: Abnormal   Collection Time: 11/16/21  3:02 PM   Specimen: Anterior Nasal Swab  Result Value Ref Range Status   SARS Coronavirus 2 by RT PCR POSITIVE (A) NEGATIVE Final    Comment: (NOTE) SARS-CoV-2 target nucleic acids are DETECTED.  The SARS-CoV-2 RNA is generally detectable in upper respiratory specimens during the acute phase of infection. Positive results are indicative of the presence of the identified virus, but do not rule out bacterial infection or co-infection with other pathogens not detected by the test. Clinical correlation with patient history and other diagnostic information is necessary to determine patient infection status. The expected result is Negative.  Fact Sheet for Patients: 11/18/21  Fact Sheet for Healthcare  Providers: BloggerCourse.com  This test is not yet approved or cleared by the [employer] FDA and  has been authorized for detection and/or diagnosis of SARS-CoV-2 by FDA under an Emergency Use Authorization (EUA).  This EUA will remain in effect (meaning this test can be used) for the duration of  the COVID-19 declaration under Section 564(b)(1) of the A ct, 21 U.S.C. section 360bbb-3(b)(1), unless the authorization is terminated or revoked sooner.     Influenza A by PCR NEGATIVE NEGATIVE Final  Influenza B by PCR NEGATIVE NEGATIVE Final    Comment: (NOTE) The Xpert Xpress SARS-CoV-2/FLU/RSV plus assay is intended as an aid in the diagnosis of influenza from Nasopharyngeal swab specimens and should not be used as a sole basis for treatment. Nasal washings and aspirates are unacceptable for Xpert Xpress SARS-CoV-2/FLU/RSV testing.  Fact Sheet for Patients: BloggerCourse.com  Fact Sheet for Healthcare Providers: SeriousBroker.it  This test is not yet approved or cleared by the [place] FDA and has been authorized for detection and/or diagnosis of SARS-CoV-2 by FDA under an Emergency Use Authorization (EUA). This EUA will remain in effect (meaning this test can be used) for the duration of the COVID-19 declaration under Section 564(b)(1) of the Act, 21 U.S.C. section 360bbb-3(b)(1), unless the authorization is terminated or revoked.  Performed at [facility], [address] [place], [place] [address]   Blood Culture (routine x 2)     Status: Abnormal   Collection Time: 11/17/21  3:58 PM   Specimen: Left Antecubital; Blood  Result Value Ref Range Status   Specimen Description   Final    LEFT ANTECUBITAL Performed at [facility], [address], [place], [place] [address]    Special Requests   Final    BOTTLES DRAWN AEROBIC AND ANAEROBIC Blood Culture adequate  volume Performed at [facility], [address] [place], [place] [address]    Culture  Setup Time   Final    GRAM POSITIVE COCCI IN BOTH AEROBIC AND ANAEROBIC BOTTLES CRITICAL RESULT CALLED TO, READ BACK BY AND VERIFIED WITHPrincella Ion [facility] 6712 11/18/21 AT Performed at [facility], [address] [place], [place] [address]    Culture VIRIDANS STREPTOCOCCUS (A)  Final   Report Status 11/20/2021 FINAL  Final   Organism ID, Bacteria VIRIDANS STREPTOCOCCUS  Final      Susceptibility   Viridans streptococcus - MIC*    PENICILLIN <=0.06 SENSITIVE Sensitive     CEFTRIAXONE <=0.12 SENSITIVE Sensitive     ERYTHROMYCIN <=0.12 SENSITIVE Sensitive     LEVOFLOXACIN 1 SENSITIVE Sensitive     VANCOMYCIN 0.5 SENSITIVE Sensitive     * VIRIDANS STREPTOCOCCUS  Blood Culture ID Panel (Reflexed)     Status: Abnormal   Collection Time: 11/17/21  3:58 PM  Result Value Ref Range Status   Enterococcus faecalis NOT DETECTED NOT DETECTED Final   Enterococcus Faecium NOT DETECTED NOT DETECTED Final   Listeria monocytogenes NOT DETECTED NOT DETECTED Final   Staphylococcus species NOT DETECTED NOT DETECTED Final   Streptococcus species DETECTED (A) NOT DETECTED Final    Comment: Not Enterococcus species, Streptococcus agalactiae, Streptococcus pyogenes, or Streptococcus pneumoniae. CRITICAL RESULT CALLED TO, READ BACK BY AND VERIFIED WITH: JASON ROBBINS 11/19/21 0345 AT    Streptococcus agalactiae NOT DETECTED NOT DETECTED Final   Streptococcus pneumoniae NOT DETECTED NOT DETECTED Final   Streptococcus pyogenes NOT DETECTED NOT DETECTED Final   A.calcoaceticus-baumannii NOT DETECTED NOT DETECTED Final   Bacteroides fragilis NOT DETECTED NOT DETECTED Final   Enterobacterales NOT DETECTED NOT DETECTED Final   Enterobacter cloacae complex NOT DETECTED NOT DETECTED Final   Escherichia coli NOT DETECTED NOT DETECTED Final   Klebsiella aerogenes NOT DETECTED NOT DETECTED Final    Klebsiella oxytoca NOT DETECTED NOT DETECTED Final   Klebsiella pneumoniae NOT DETECTED NOT DETECTED Final   Proteus species NOT DETECTED NOT DETECTED Final   Salmonella species NOT DETECTED NOT DETECTED Final   Serratia marcescens NOT DETECTED NOT DETECTED Final  Haemophilus influenzae NOT DETECTED NOT DETECTED Final   Neisseria meningitidis NOT DETECTED NOT DETECTED Final   Pseudomonas aeruginosa NOT DETECTED NOT DETECTED Final   Stenotrophomonas maltophilia NOT DETECTED NOT DETECTED Final   Candida albicans NOT DETECTED NOT DETECTED Final   Candida auris NOT DETECTED NOT DETECTED Final   Candida glabrata NOT DETECTED NOT DETECTED Final   Candida krusei NOT DETECTED NOT DETECTED Final   Candida parapsilosis NOT DETECTED NOT DETECTED Final   Candida tropicalis NOT DETECTED NOT DETECTED Final   Cryptococcus neoformans/gattii NOT DETECTED NOT DETECTED Final    Comment: Performed at [facility], [address] [place], [place] [address]  Blood Culture (routine x 2)     Status: Abnormal   Collection Time: 11/17/21  5:48 PM   Specimen: BLOOD RIGHT ARM  Result Value Ref Range Status   Specimen Description   Final    BLOOD RIGHT ARM Performed at [facility], [address], [place], [place] [address]    Special Requests   Final    BOTTLES DRAWN AEROBIC AND ANAEROBIC Blood Culture adequate volume Performed at [facility], [address] [place], [place] [address]    Culture  Setup Time   Final    GRAM POSITIVE COCCI IN BOTH AEROBIC AND ANAEROBIC BOTTLES CRITICAL VALUE NOTED.  VALUE IS CONSISTENT WITH PREVIOUSLY REPORTED AND CALLED VALUE. Performed at [facility], [address] [place], [place] [address]    Culture (A)  Final    VIRIDANS STREPTOCOCCUS SUSCEPTIBILITIES PERFORMED ON PREVIOUS CULTURE WITHIN THE LAST 5 DAYS. Performed at [facility], [address] [place], [place] [address]    Report Status 11/20/2021 FINAL  Final   Culture, blood (Routine X 2) w Reflex to ID Panel     Status: None (Preliminary result)   Collection Time: 11/20/21  3:53 AM   Specimen: BLOOD  Result Value Ref Range Status   Specimen Description BLOOD RIGHT ARM  Final   Special Requests   Final    BOTTLES DRAWN AEROBIC AND ANAEROBIC Blood Culture adequate volume   Culture   Final    NO GROWTH < 12 HOURS Performed at [facility], [address] [place], [place] [address]    Report Status PENDING  Incomplete  Culture, blood (Routine X 2) w Reflex to ID Panel     Status: None (Preliminary result)   Collection Time: 11/20/21  3:54 AM   Specimen: BLOOD  Result Value Ref Range Status   Specimen Description BLOOD RIGHT HAND  Final   Special Requests   Final    BOTTLES DRAWN AEROBIC AND ANAEROBIC Blood Culture results may not be optimal due to an excessive volume of blood received in culture bottles   Culture   Final    NO GROWTH < 12 HOURS Performed at [facility], [address] [place], [place] [address]    Report Status PENDING  Incomplete    IMAGING RESULTS:  No acute cardiopulmonary findings I have personally reviewed the films ? Impression/Recommendation Streptococcus viridans bacteremia- very poor dentition- could be the source. Need to r/o endocarditis  Will get 2d echo Will need TEE Continue ceftriaxone  Severely elevated transaminases- unclear etiology Infection vs other causes Amoidarone and statin placed on hold  AKI  Pancytopenia- r/o nutritional def like B12/folate, liver disease  Covid viral illness ( not vaccinated) On no Rx because of AKI and hepatitis antivirals cannot be used  Cad s/p CABG  Afib  was on amiodarone-?on hold due to transaminitis ?On eliquis ___________________________________________________ Discussed with patient, r And care team Note:  This document was prepared using Dragon voice recognition software and may include unintentional dictation errors.

## 2021-11-20 NOTE — Progress Notes (Signed)
[facility] [place] NAME: Audrey Wolf    MR#:  [id number]  DATE OF BIRTH:  29-Mar-1950  SUBJECTIVE:   Son at bedside. Patient doing overall well. No fever no abdominal pain tolerating PO diet. Had a bowel movement.   VITALS:  Blood pressure (!) 132/50, pulse (!) 59, temperature 98.5 F (36.9 C), resp. rate 16, height 5\' 9"  (1.753 m), weight 90.7 kg, SpO2 100 %.  PHYSICAL EXAMINATION:   GENERAL:  71 y.o.-year-old patient lying in the bed with no acute distress.  LUNGS: Normal breath sounds bilaterally, no wheezing CARDIOVASCULAR: S1, S2 normal. No murmurs,   ABDOMEN: Soft, nontender, nondistended. Bowel sounds present.  EXTREMITIES: No  edema b/l.    NEUROLOGIC: nonfocal  patient is alert and awake SKIN: No obvious rash, lesion, or ulcer.   LABORATORY PANEL:  CBC Recent Labs  Lab 11/20/21 0353  WBC 2.8*  HGB 8.3*  HCT 26.4*  PLT 115*    Chemistries  Recent Labs  Lab 11/20/21 0353  NA 140  K 3.3*  CL 107  CO2 24  GLUCOSE 86  BUN 24*  CREATININE 1.14*  CALCIUM 8.0*  MG 2.3  AST 872*  ALT 1,292*  ALKPHOS 113  BILITOT 1.1   Assessment and Plan  LADANA CHAVERO is a 71 y.o. female with medical history significant of CAD s/p CABG, HFpEF, hypertension, asthma, CKD stage IIIb who presents to the ED with complaints of nausea and vomiting.   Pt began feeling ill on 10/13 with fever, chills, productive cough, generalized weakness, malaise and fatigue.  She went to the ED on 10/14, and was dx with covid and discharged home.  After returning home she continued to feel poor, and notes dizziness when attempting to get out of her car and she fell.      Nausea & vomiting --pt's main complaint on presentation.  Maybe due to covid infection.   --Resolved after presentation.   Bacteremia due to Streptococcus viridans --started on IV vanc and switched to ceftriaxone --cont ceftriaxone 2g daily pending cx  results --repeat blood cx  --ID consulted   Acute hepatitis due to shock liver/ischemic hepatitis --AST 6000's, ALT 3000's on presentation. --  GI consult with Dr Alice Reichert clinical presentation most c/w ischemic hepatitis 2/2 hypoperfusion to liver in setting of dehydration and COVID-19 virus infection.   --hepatitis panel neg.  No recent abx use. 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  Presenting with creatinine of 2.12, previously 1.39 the day prior.  Likely in the setting of hypovolemia due to ongoing emesis and diarrhea. --s/p IVF and MIVF with improvement in Cr to 1.18 today --cont encourage oral hydration   COVID-19 virus infection Patient presenting with 3 days of fever, malaise, cough, N/V/D, and generalized weakness.  No respiratory symptoms at this time no evidence of pneumonia on chest x-ray.  Discussed with pharmacy; due to AKI and acute hepatitis, patient is not a candidate for any antivirals.  No indication for Decadron at this time.   --Supportive management only.   Asthma No evidence of wheezing on examination and patient denied shortness of breath. -Continue home bronchodilators   Paroxysmal atrial fibrillation (HCC) Sinus rhythm on EKG. --cont Eliquis - Holding home amiodarone due to acute hepatitis --cont home coreg - Holding  home diltiazem for now due to HR in 50's   CAD (coronary artery disease), native coronary artery - Holding home statin due to acute hepatitis   Metabolic acidosis, normal anion gap (NAG) In the setting of vomiting and diarrhea.  No indication for bicarb therapy at this time.   Chronic diastolic CHF (congestive heart failure) (HCC) --stable.     Hypertension --home BP held on admission due to low normal BP --cont home coreg --hold home dilt for now due to HR  in 50's --start hydralazine 50 mg q8h     DVT prophylaxis: [id number] Code Status: Full code  Family Communication: son at bedside Level of care: Med-Surg Dispo:   The patient is from: home Anticipated d/c is to: home Anticipated d/c date is: 2-3 days. Awaiting ID consultation and recommendations and further workup regarding strep bacteremia.       TOTAL TIME TAKING CARE OF THIS PATIENT: 35 minutes.  >50% time spent on counselling and coordination of care  Note: This dictation was prepared with Dragon dictation along with smaller phrase technology. Any transcriptional errors that result from this process are unintentional.  Fritzi Mandes M.D    [facility]   CC: Primary care physician; Leone Haven, MD

## 2021-11-21 ENCOUNTER — Inpatient Hospital Stay
Admit: 2021-11-21 | Discharge: 2021-11-21 | Disposition: A | Discharge status: Home / Self Care | Payer: BC Managed Care – PPO | Attending: Infectious Diseases | Admitting: Infectious Diseases

## 2021-11-21 DIAGNOSIS — J4520 Mild intermittent asthma, uncomplicated: Secondary | ICD-10-CM | POA: Diagnosis not present

## 2021-11-21 DIAGNOSIS — B179 Acute viral hepatitis, unspecified: Secondary | ICD-10-CM | POA: Diagnosis not present

## 2021-11-21 DIAGNOSIS — N179 Acute kidney failure, unspecified: Secondary | ICD-10-CM | POA: Diagnosis not present

## 2021-11-21 DIAGNOSIS — R7881 Bacteremia: Secondary | ICD-10-CM | POA: Diagnosis not present

## 2021-11-21 LAB — CBC WITH DIFFERENTIAL/PLATELET
Abs Immature Granulocytes: 0.02 10*3/uL (ref 0.00–0.07)
Basophils Absolute: 0 10*3/uL (ref 0.0–0.1)
Basophils Relative: 0 %
Eosinophils Absolute: 0 10*3/uL (ref 0.0–0.5)
Eosinophils Relative: 0 %
HCT: 26 % — ABNORMAL LOW (ref 36.0–46.0)
Hemoglobin: 8.2 g/dL — ABNORMAL LOW (ref 12.0–15.0)
Immature Granulocytes: 1 %
Lymphocytes Relative: 24 %
Lymphs Abs: 0.8 10*3/uL (ref 0.7–4.0)
MCH: 25.3 pg — ABNORMAL LOW (ref 26.0–34.0)
MCHC: 31.5 g/dL (ref 30.0–36.0)
MCV: 80.2 fL (ref 80.0–100.0)
Monocytes Absolute: 0.5 10*3/uL (ref 0.1–1.0)
Monocytes Relative: 14 %
Neutro Abs: 2 10*3/uL (ref 1.7–7.7)
Neutrophils Relative %: 61 %
Platelets: 118 10*3/uL — ABNORMAL LOW (ref 150–400)
RBC: 3.24 MIL/uL — ABNORMAL LOW (ref 3.87–5.11)
RDW: 15.3 % (ref 11.5–15.5)
WBC: 3.3 10*3/uL — ABNORMAL LOW (ref 4.0–10.5)
nRBC: 0 % (ref 0.0–0.2)

## 2021-11-21 LAB — COMPREHENSIVE METABOLIC PANEL
ALT: 878 U/L — ABNORMAL HIGH (ref 0–44)
AST: 392 U/L — ABNORMAL HIGH (ref 15–41)
Albumin: 2.7 g/dL — ABNORMAL LOW (ref 3.5–5.0)
Alkaline Phosphatase: 127 U/L — ABNORMAL HIGH (ref 38–126)
Anion gap: 3 — ABNORMAL LOW (ref 5–15)
BUN: 22 mg/dL (ref 8–23)
CO2: 22 mmol/L (ref 22–32)
Calcium: 7.7 mg/dL — ABNORMAL LOW (ref 8.9–10.3)
Chloride: 112 mmol/L — ABNORMAL HIGH (ref 98–111)
Creatinine, Ser: 1.11 mg/dL — ABNORMAL HIGH (ref 0.44–1.00)
GFR, Estimated: 53 mL/min — ABNORMAL LOW (ref >60)
Glucose, Bld: 91 mg/dL (ref 70–99)
Potassium: 3.1 mmol/L — ABNORMAL LOW (ref 3.5–5.1)
Sodium: 137 mmol/L (ref 135–145)
Total Bilirubin: 0.8 mg/dL (ref 0.3–1.2)
Total Protein: 5 g/dL — ABNORMAL LOW (ref 6.5–8.1)

## 2021-11-21 LAB — MAGNESIUM: Magnesium: 2 mg/dL (ref 1.7–2.4)

## 2021-11-21 LAB — FERRITIN: Ferritin: 331 ng/mL — ABNORMAL HIGH (ref 11–307)

## 2021-11-21 LAB — VITAMIN B12: Vitamin B-12: 2411 pg/mL — ABNORMAL HIGH (ref 180–914)

## 2021-11-21 LAB — C-REACTIVE PROTEIN: CRP: 2.3 mg/dL — ABNORMAL HIGH (ref <1.0)

## 2021-11-21 LAB — FOLATE: Folate: 19 ng/mL (ref >5.9)

## 2021-11-21 MED ORDER — LOSARTAN POTASSIUM 25 MG PO TABS
25.0000 mg | ORAL_TABLET | Freq: Every day | ORAL | Status: DC
  Administered 2021-11-21 – 2021-11-22 (×2): 25 mg via ORAL
  Filled 2021-11-21 (×2): qty 1

## 2021-11-21 MED ORDER — POTASSIUM CHLORIDE 20 MEQ PO PACK
20.0000 meq | PACK | Freq: Every day | ORAL | Status: DC
  Administered 2021-11-21 – 2021-11-22 (×2): 20 meq via ORAL
  Filled 2021-11-21 (×2): qty 1

## 2021-11-21 MED ORDER — OCUVITE-LUTEIN PO CAPS
2.0000 | ORAL_CAPSULE | Freq: Every day | ORAL | Status: DC
  Administered 2021-11-21 – 2021-11-22 (×2): 2 via ORAL
  Filled 2021-11-21 (×2): qty 2

## 2021-11-21 MED ORDER — FAMOTIDINE 20 MG PO TABS
20.0000 mg | ORAL_TABLET | Freq: Two times a day (BID) | ORAL | Status: DC
  Administered 2021-11-21 – 2021-11-22 (×3): 20 mg via ORAL
  Filled 2021-11-21 (×3): qty 1

## 2021-11-21 MED ORDER — FERROUS SULFATE 325 (65 FE) MG PO TABS
325.0000 mg | ORAL_TABLET | Freq: Every day | ORAL | Status: DC
  Administered 2021-11-22: 325 mg via ORAL
  Filled 2021-11-21: qty 1

## 2021-11-21 MED ORDER — FESOTERODINE FUMARATE ER 4 MG PO TB24
4.0000 mg | ORAL_TABLET | Freq: Every day | ORAL | Status: DC
  Administered 2021-11-21 – 2021-11-22 (×2): 4 mg via ORAL
  Filled 2021-11-21 (×2): qty 1

## 2021-11-21 NOTE — Progress Notes (Signed)
[facility] [place] NAME: Audrey Wolf    MR#:  [id number]  DATE OF BIRTH:  08/10/1950  SUBJECTIVE:   No new complaints. Tolerating PO diet some. Denies any pain. Ambulating to the bathroom.  VITALS:  Blood pressure (!) 160/60, pulse 61, temperature 98.2 F (36.8 C), temperature source Oral, resp. rate 19, height 5\' 9"  (1.753 m), weight 90.7 kg, SpO2 99 %.  PHYSICAL EXAMINATION:   GENERAL:  71 y.o.-year-old patient lying in the bed with no acute distress. Poor dentition LUNGS: Normal breath sounds bilaterally, no wheezing CARDIOVASCULAR: S1, S2 normal. No murmurs,   ABDOMEN: Soft, nontender, nondistended. Bowel sounds present.  EXTREMITIES: No  edema b/l.    NEUROLOGIC: nonfocal  patient is alert and awake SKIN: No obvious rash, lesion, or ulcer.   LABORATORY PANEL:  CBC Recent Labs  Lab 11/21/21 0550  WBC 3.3*  HGB 8.2*  HCT 26.0*  PLT 118*     Chemistries  Recent Labs  Lab 11/21/21 0550  NA 137  K 3.1*  CL 112*  CO2 22  GLUCOSE 91  BUN 22  CREATININE 1.11*  CALCIUM 7.7*  MG 2.0  AST 392*  ALT 878*  ALKPHOS 127*  BILITOT 0.8    Assessment and Plan  Audrey Wolf is a 71 y.o. female with medical history significant of CAD s/p CABG, HFpEF, hypertension, asthma, CKD stage IIIb who presents to the ED with complaints of nausea and vomiting.   Pt began feeling ill on 10/13 with fever, chills, productive cough, generalized weakness, malaise and fatigue.  She went to the ED on 10/14, and was dx with covid and discharged home.  After returning home she continued to feel poor, and notes dizziness when attempting to get out of her car and she fell.     Bacteremia due to Streptococcus viridans suspected source poor dentition --started on IV vanc and switched to ceftriaxone --cont ceftriaxone 2g daily pending cx results --repeat blood cx negative --ID consult appreciated-- patient will need PICC line if repeat  culture is negative for 48 hours. -- Echo pending -patient will need TEE as outpatient per ID. -- Await ID recommendation for duration of antibiotic.   Acute hepatitis due to shock liver/ischemic hepatitis --AST 6000's, ALT 3000's on presentation. --  GI consult with Dr Alice Reichert clinical presentation most c/w ischemic hepatitis 2/2 hypoperfusion to liver in setting of dehydration and COVID-19 virus infection.   --hepatitis panel neg.  No recent abx use. 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  Presenting with creatinine of 2.12, previously 1.39 the day prior.  Likely in the setting of hypovolemia due to ongoing emesis and diarrhea. --s/p IVF and MIVF with improvement in Cr to 1.18 today --cont encourage oral hydration   COVID-19 virus infection Patient presenting with 3 days of fever, malaise, cough, N/V/D, and generalized weakness.  No respiratory symptoms at this time no evidence of pneumonia on chest x-ray.  Discussed with pharmacy; due to AKI and acute hepatitis, patient is not a candidate for any antivirals.  No indication for Decadron at this time.   --Supportive management only.   Asthma No evidence of wheezing on examination and patient denied shortness of breath. -Continue home bronchodilators    Nausea & vomiting --pt's main complaint on presentation.  Maybe due to covid infection.   --Resolved after presentation.  Paroxysmal atrial fibrillation (HCC) Sinus rhythm on EKG. --cont Eliquis - Holding home amiodarone due to acute hepatitis --cont home coreg - Holding home diltiazem for now due to HR in 50's   CAD (coronary artery disease), native coronary artery - Holding home statin due to acute hepatitis   Metabolic acidosis, normal anion gap (NAG) In the setting of vomiting and diarrhea.  No  indication for bicarb therapy at this time.   Chronic diastolic CHF (congestive heart failure) (HCC) --stable.     Hypertension --home BP held on admission due to low normal BP --cont home coreg --hold home dilt for now due to HR in 50's --start hydralazine 50 mg q8h     DVT prophylaxis: [id number] Code Status: Full code  Family Communication: son at bedside Level of care: Med-Surg Dispo:   The patient is from: home Anticipated d/c is to: home Anticipated d/c date is: 1-3 days. Patient will lead PICC line placed 48 hours after repeat blood culture negative likely on Friday. Anticipate discharge by Saturday once IV antibiotics has been set up at home and teaching has been done.    TOTAL TIME TAKING CARE OF THIS PATIENT: 35 minutes.  >50% time spent on counselling and coordination of care  Note: This dictation was prepared with Dragon dictation along with smaller phrase technology. Any transcriptional errors that result from this process are unintentional.  Fritzi Mandes M.D    [facility]   CC: Primary care physician; Leone Haven, MD

## 2021-11-21 NOTE — Plan of Care (Signed)

## 2021-11-21 NOTE — Progress Notes (Signed)
*  PRELIMINARY RESULTS* Echocardiogram 2D Echocardiogram has been performed.  Audrey Wolf 11/21/2021, 10:56 AM

## 2021-11-22 ENCOUNTER — Ambulatory Visit: Payer: Self-pay | Admitting: Cardiology

## 2021-11-22 ENCOUNTER — Inpatient Hospital Stay: Payer: Self-pay

## 2021-11-22 DIAGNOSIS — B955 Unspecified streptococcus as the cause of diseases classified elsewhere: Secondary | ICD-10-CM | POA: Diagnosis not present

## 2021-11-22 DIAGNOSIS — R7881 Bacteremia: Secondary | ICD-10-CM | POA: Diagnosis not present

## 2021-11-22 DIAGNOSIS — R112 Nausea with vomiting, unspecified: Secondary | ICD-10-CM | POA: Diagnosis not present

## 2021-11-22 LAB — CBC
HCT: 26.9 % — ABNORMAL LOW (ref 36.0–46.0)
Hemoglobin: 8.5 g/dL — ABNORMAL LOW (ref 12.0–15.0)
MCH: 25.4 pg — ABNORMAL LOW (ref 26.0–34.0)
MCHC: 31.6 g/dL (ref 30.0–36.0)
MCV: 80.5 fL (ref 80.0–100.0)
Platelets: 117 10*3/uL — ABNORMAL LOW (ref 150–400)
RBC: 3.34 MIL/uL — ABNORMAL LOW (ref 3.87–5.11)
RDW: 15.6 % — ABNORMAL HIGH (ref 11.5–15.5)
WBC: 3.7 10*3/uL — ABNORMAL LOW (ref 4.0–10.5)
nRBC: 0 % (ref 0.0–0.2)

## 2021-11-22 LAB — ECHOCARDIOGRAM COMPLETE
AR max vel: 2.06 cm2
AV Area VTI: 1.94 cm2
AV Area mean vel: 2 cm2
AV Mean grad: 8 mmHg
AV Peak grad: 14.9 mmHg
Ao pk vel: 1.93 m/s
Area-P 1/2: 2.68 cm2
Height: 69 in
S' Lateral: 3 cm
Weight: 3200 oz

## 2021-11-22 LAB — BASIC METABOLIC PANEL
Anion gap: 6 (ref 5–15)
BUN: 16 mg/dL (ref 8–23)
CO2: 20 mmol/L — ABNORMAL LOW (ref 22–32)
Calcium: 8.2 mg/dL — ABNORMAL LOW (ref 8.9–10.3)
Chloride: 113 mmol/L — ABNORMAL HIGH (ref 98–111)
Creatinine, Ser: 1.04 mg/dL — ABNORMAL HIGH (ref 0.44–1.00)
GFR, Estimated: 57 mL/min — ABNORMAL LOW (ref >60)
Glucose, Bld: 108 mg/dL — ABNORMAL HIGH (ref 70–99)
Potassium: 3.5 mmol/L (ref 3.5–5.1)
Sodium: 139 mmol/L (ref 135–145)

## 2021-11-22 LAB — MAGNESIUM: Magnesium: 1.9 mg/dL (ref 1.7–2.4)

## 2021-11-22 LAB — HIV ANTIBODY (ROUTINE TESTING W REFLEX): HIV Screen 4th Generation wRfx: NONREACTIVE

## 2021-11-22 MED ORDER — SODIUM CHLORIDE 0.9% FLUSH: 10.0000 mL | Freq: Two times a day (BID) | INTRAVENOUS | Status: DC

## 2021-11-22 MED ORDER — SOTALOL HCL 80 MG PO TABS: 80.0000 mg | ORAL_TABLET | Freq: Every day | ORAL | 2 refills | Status: DC

## 2021-11-22 MED ORDER — CEFTRIAXONE IV (FOR PTA / DISCHARGE USE ONLY): 2.0000 g | INTRAVENOUS | 0 refills | Status: AC

## 2021-11-22 MED ORDER — DILTIAZEM HCL 30 MG PO TABS
30.0000 mg | ORAL_TABLET | Freq: Three times a day (TID) | ORAL | Status: DC
  Administered 2021-11-22: 30 mg via ORAL
  Filled 2021-11-22: qty 1

## 2021-11-22 MED ORDER — SOTALOL HCL 80 MG PO TABS: 80.0000 mg | ORAL_TABLET | Freq: Every day | ORAL | Status: DC

## 2021-11-22 MED ORDER — ROSUVASTATIN CALCIUM 20 MG PO TABS: ORAL_TABLET | ORAL | 0 refills | Status: DC

## 2021-11-22 MED ORDER — CHLORHEXIDINE GLUCONATE CLOTH 2 % EX PADS: 6.0000 | MEDICATED_PAD | Freq: Every day | CUTANEOUS | Status: DC

## 2021-11-22 MED ORDER — DILTIAZEM HCL 30 MG PO TABS: 30.0000 mg | ORAL_TABLET | Freq: Three times a day (TID) | ORAL | 2 refills | Status: DC

## 2021-11-22 MED ORDER — SODIUM CHLORIDE 0.9% FLUSH: 10.0000 mL | INTRAVENOUS | Status: DC | PRN

## 2021-11-22 NOTE — Consult Note (Signed)
PHARMACY CONSULT NOTE FOR:  OUTPATIENT  PARENTERAL ANTIBIOTIC THERAPY (OPAT)  Indication: Strep viridians bacteremia  Regimen: Ceftriaxone 2g IV Q24H End date: 12/29/2021  IV antibiotic discharge orders are pended. To discharging provider:  please sign these orders via discharge navigator,  Select New Orders & click on the button choice - Manage This Unsigned Work.    Labs weekly on Monday while on IV antibiotics: CBC with differential, CMP Fax weekly lab results promptly to [phone]  Please pull PICC at completion of IV antibiotics  Thank you for allowing pharmacy to be a part of this patient's care.  Gretel Acre, PharmD PGY1 Pharmacy Resident 11/22/2021 9:47 AM

## 2021-11-22 NOTE — Consult Note (Signed)
Patient tested positive for Covid on 11/16/21. Admitted to [facility] on 11/17/21 for bacteremia due to Streptococcus viridans suspected source poor dentition. Patient will need TEE after 11/30/21 due to Covid positive. Elevated LFTs- acute hepatitis, amiodarone discontinued. Patient scheduled for TEE on 12/02/21.

## 2021-11-22 NOTE — TOC Progression Note (Signed)
Transition of Care [facility]) - Progression Note    Patient Details  Name: Audrey Wolf MRN: [id number] Date of Birth: 06/26/1950  Transition of Care [facility]) CM/SW [place], [place] Phone Number: 11/22/2021, 2:05 PM  Clinical Narrative:      The patient will need to discharge with IV ABX. SW reached out to [place] who explained that [place] could assist with the [facility].   Pam from [facility] explained that she will meet with the family for family teaching this afternoon. Patient will discharge home with family this afternoon.      Expected Discharge Plan and [place] and [facility] Family teaching on 11/22/21     Expected Discharge Date: 11/22/21                                     Social Determinants of Health (SDOH) Interventions    Readmission Risk Interventions     No data to display

## 2021-11-22 NOTE — TOC Transition Note (Signed)
Transition of Care [facility]) - CM/SW Discharge Note   Patient Details  Name: BRANTLEIGH MIFFLIN MRN: [id number] Date of Birth: 1950/03/18  Transition [facility]) CM/SW Contact:  Colen Darling, [place] Phone Number: 11/22/2021, 2:14 PM   Clinical Narrative:     Patient will discharge home after family teaching. Patient will have [facility] assisting with IV antiobiotics and [place] with [facility].  Final next level of care: [place]     Patient Goals and CMS Choice Patient states their goals for this hospitalization and ongoing recovery are:: return home with IV ABX and [facility]      Discharge Placement                  Name of family member notified: Orlene Erm- [phone] Patient and family notified of of transfer: 11/22/21  Discharge Plan and Services                DME Arranged: IV pump/equipment DME Agency:  ([facility], [place]) Date DME Agency Contacted: 11/22/21   Representative spoke with at DME Agency: Carolynn Sayers - [facility] HH Arranged: IV Antibiotics, RN HH Agency: [employer] spoke with at [place]: [employer] will work with [employer]  Social Determinants of Health ([place]) Interventions     Readmission Risk Interventions     No data to display

## 2021-11-22 NOTE — Treatment Plan (Signed)
Diagnosis: Strep viridans bacteremia Baseline Creatinine 1.11    Allergies  Allergen Reactions   Lisinopril Cough   Ace Inhibitors Other (See Comments)    Has been told to avoid these and any diuretics because "left kidney has shrunk up and doesn't work"    OPAT Orders Discharge antibiotics:ceftriaxone 2 grams Iv every 24 hours Duration:6 weeks  End Date:12/29/21   [facility] Care Per Protocol:  Labs weekly on Monday while on IV antibiotics: X__ CBC with differential  _X_ CMP   _X_ Please pull PIC at completion of IV antibiotics   Fax weekly lab results  promptly to [phone]  Clinic Follow Up Appt with Dr.Leyan Branden ID : 12/05/21 at 10.50am   Call [phone] with any questions

## 2021-11-22 NOTE — Progress Notes (Signed)
   Date of Admission:  11/17/2021   T  ID: Audrey Wolf is a 71 y.o. female  Principal Problem:   Nausea & vomiting Active Problems:   Hypertension   CAD (coronary artery disease), native coronary artery   Asthma   Paroxysmal atrial fibrillation (HCC)   Chronic diastolic CHF (congestive heart failure) (HCC)   Acute kidney injury superimposed on chronic kidney disease ([place])   COVID-19 virus infection   Acute hepatitis   Metabolic acidosis, normal anion gap (NAG)   Bacteremia due to Streptococcus    Subjective: Pt feeling better Cough better Walked to the bathroom No bodyache  Medications:   apixaban  5 mg Oral BID   carvedilol  25 mg Oral BID WC   famotidine  20 mg Oral BID   ferrous sulfate  325 mg Oral Q breakfast   fesoterodine  4 mg Oral Daily   fluticasone furoate-vilanterol  1 puff Inhalation Daily   hydrALAZINE  50 mg Oral Q8H   lidocaine  2 patch Transdermal Q24H   losartan  25 mg Oral Daily   multivitamin-lutein  2 capsule Oral Daily   potassium chloride  20 mEq Oral Daily   sodium chloride flush  3 mL Intravenous Q12H    Objective: Vital signs in last 24 hours: Temp:  [98.3 F (36.8 C)-98.6 F (37 C)] 98.6 F (37 C) (10/20 0743) Pulse Rate:  [62-69] 69 (10/20 0743) Resp:  [16-18] 18 (10/20 0743) BP: (143-166)/(51-65) 149/65 (10/20 0743) SpO2:  [99 %-100 %] 100 % (10/20 0743)  Lab Results    Latest Ref Rng & Units 11/22/2021   11:28 AM 11/21/2021    5:50 AM 11/20/2021    3:53 AM  CBC  WBC 4.0 - 10.5 K/uL 3.7  3.3  2.8   Hemoglobin 12.0 - 15.0 g/dL 8.5  8.2  8.3   Hematocrit 36.0 - 46.0 % 26.9  26.0  26.4   Platelets 150 - 400 K/uL 117  118  115      Liver Panel Recent Labs    11/20/21 0353 11/21/21 0550  PROT 5.0* 5.0*  ALBUMIN 2.7* 2.7*  AST 872* 392*  ALT 1,292* 878*  ALKPHOS 113 127*  BILITOT 1.1 0.8  C-Reactive Protein Recent Labs    11/20/21 0353 11/21/21 0550  CRP 4.2* 2.3*    Microbiology: 10/15 Strep  viridans 10/18 NG so far  Impression/Recommendation Streptococcus viridans bacteremia- very poor dentition- could be the source. Need to r/o endocarditis  2d echo no veg Will need TEE Continue ceftriaxone   Severely elevated transaminases- improved unclear etiology Infection vs other causes Amoidarone and statin placed on hold Viral Hepatitis panel neg   AKI- improved   Pancytopenia- ruled out nutritional def like B12/folate, ?liver disease   Covid viral illness ( not vaccinated) On no Rx because of AKI and hepatitis antivirals cannot be used   Cad s/p CABG   Afib was on amiodarone-?on hold due to transaminitis ?On eliquis  Pt will have TEE as OP Will go on IV ceftriaxone for 6 weeks total- being treated as endocarditis IF TEE scheduled on 10/28 is neg then may shorten the course or change to PO antibiotic

## 2021-11-22 NOTE — Discharge Summary (Signed)
Physician Discharge Summary   Audrey Wolf  female DOB: [birth year]  ATF:[id number]  PCP: Leone Haven, MD  Admit date: 11/17/2021 Discharge date: 11/22/2021  Admitted From: home Disposition:  home CODE STATUS: Full code  Discharge Instructions     [facility] pharmacist to adjust dose for Vancomycin, Aminoglycosides and other anti-infective therapies as requested by physician.   Complete by: As directed    [facility] to provide Cath Flo 52m   Complete by: As directed    Administer for PICC line occlusion and as ordered by physician for other access device issues.   Anaphylaxis Kit: Provided to treat any anaphylactic reaction to the medication being provided to the patient if First Dose or when requested by physician   Complete by: As directed    Epinephrine 137mml vial / amp: Administer 0.27m79m0.27ml50mubcutaneously once for moderate to severe anaphylaxis, nurse to call physician and pharmacy when reaction occurs and call 911 if needed for immediate care   Diphenhydramine 50mg21mIV vial: Administer 25-50mg 50mM PRN for first dose reaction, rash, itching, mild reaction, nurse to call physician and pharmacy when reaction occurs   Sodium Chloride 0.9% NS 500ml I72mdminister if needed for hypovolemic blood pressure drop or as ordered by physician after call to physician with anaphylactic reaction   Change dressing on IV access line weekly and PRN   Complete by: As directed    Discharge instructions   Complete by: As directed    You will need IV ceftriaxone for your blood infection until 12/29/21.  Please follow up with infectious disease Dr. RavishaDelaine Lame2/23 at 10.50 am.  You also presented with severe hepatitis (inflammation of the liver), so please stop taking amiodarone.  Please also hold your Crestor until followup with your outpatient doctor.  You will have a transesophageal echocardiogram done as outpatient with Dr. Khan, sHumphrey Rollsuled  for 12/02/21.   Dr. Kameelah Minish LaEnzo BiFlush IV access with Sodium Chloride 0.9% and Heparin 10 units/ml or 100 units/ml   Complete by: As directed    Home infusion instructions - [facility]   Complete by: As directed    Instructions: Flush IV access with Sodium Chloride 0.9% and Heparin 10units/ml or 100units/ml   Change dressing on IV access line: Weekly and PRN   Instructions Cath Flo 2mg: Ad25mister for PICC Line occlusion and as ordered by physician for other access device   [facility] pharmacist to adjust dose for: Vancomycin, Aminoglycosides and other anti-infective therapies as requested by physician   Method of administration may be changed at the discretion of home infusion pharmacist based upon assessment of the patient and/or caregiver's ability to self-administer the medication ordered   Complete by: As directed       Hospital Course:  For full details, please see H&P, progress notes, consult notes and ancillary notes.  Briefly,  Audrey Wolf y.o. 71male with medical history significant of CAD s/p CABG, HFpEF, hypertension, asthma, CKD stage IIIb who presented to the ED with complaints of nausea and vomiting.   Pt began feeling ill on 10/13 with fever, chills, productive cough, generalized weakness, malaise and fatigue.  She went to the ED on 10/14, and was dx with covid and discharged home.  After returning home she continued to feel poor, and notes dizziness when attempting to get out of her car and she fell.     Bacteremia due to Streptococcus viridans suspected  source poor dentition --started on IV vanc and switched to ceftriaxone --repeat blood cx negative --ID consult appreciated.  Pt will receive 6 weeks of ceftriaxone with End Date:12/29/21.   --Clinic Follow Up Appt with Dr.Ravishankar ID : 12/05/21 at 10.50am --Patient needs TEE, however due to covid infection, scheduled for TEE on 12/02/21.   Acute hepatitis due to shock  liver/ischemic hepatitis --AST 6000's, ALT 3000's on presentation. --  GI consult with Dr Alice Reichert clinical presentation most c/w ischemic hepatitis 2/2 hypoperfusion to liver in setting of dehydration and COVID-19 virus infection.  No indications at this time for further GI work-up --hepatitis panel neg.  No recent abx use.  Amiodarone was recently started, which could cause liver toxicity, so amiodarone d/c'ed. 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  Presenting with creatinine of 2.12, previously 1.39 the day prior.  Likely in the setting of hypovolemia due to ongoing emesis and diarrhea. --s/p IVF and MIVF with improvement in Cr to 1.18    COVID-19 virus infection Patient presenting with 3 days of fever, malaise, cough, N/V/D, and generalized weakness.  No respiratory symptoms at this time no evidence of pneumonia on chest x-ray.  Discussed with pharmacy; due to AKI and acute hepatitis, patient is not a candidate for any antivirals.  No indication for Decadron at this time.   --Supportive management only.   Asthma No evidence of wheezing on examination and patient denied shortness of breath. -Continue home bronchodilators    Nausea & vomiting --pt's main complaint on presentation.  Maybe due to covid infection.   --Resolved after presentation.   Paroxysmal atrial fibrillation (HCC) Sinus rhythm on EKG. --cont Eliquis - home amiodarone held due to acute hepatitis, and d/c'ed at discharge. --cont home coreg - home diltiazem held on presentation due to HR in 50's, to be resumed after discharge.   CAD (coronary artery disease), native coronary artery - Holding home statin due to acute hepatitis pending outpatient f/u.   Metabolic acidosis, normal anion gap (NAG) In the setting of vomiting and diarrhea.  No indication for bicarb therapy at this time.   Chronic  diastolic CHF (congestive heart failure) (HCC) --stable.     Hypertension --cont home BP meds as below in med list.   Discharge Diagnoses:  Principal Problem:   Nausea & vomiting Active Problems:   Bacteremia due to Streptococcus   Acute hepatitis   Acute kidney injury superimposed on chronic kidney disease ([place])   COVID-19 virus infection   Asthma   Paroxysmal atrial fibrillation (HCC)   CAD (coronary artery disease), native coronary artery   Metabolic acidosis, normal anion gap (NAG)   Hypertension   Chronic diastolic CHF (congestive heart failure) ([place])   30 Day Unplanned Readmission Risk Score    Flowsheet Row ED to Hosp-Admission (Current) from 11/17/2021 in [place]  30 Day Unplanned Readmission Risk Score (%) 20.11 Filed at 11/22/2021 1200       This score is the patient's risk of an unplanned readmission within 30 days of being discharged (0 -100%). The score is based on dignosis, age, lab data, medications, orders, and past utilization.   Low:  0-14.9   Medium: 15-21.9   High: 22-29.9   Extreme: 30 and above         Discharge Instructions:  Allergies as of 11/22/2021       Reactions  Lisinopril Cough   Ace Inhibitors Other (See Comments)   Has been told to avoid these and any diuretics because "left kidney has shrunk up and doesn't work"        Medication List     STOP taking these medications    amiodarone 400 MG tablet Commonly known as: PACERONE   guaiFENesin 600 MG 12 hr tablet Commonly known as: Mucinex       TAKE these medications    acetaminophen 500 MG tablet Commonly known as: TYLENOL Take 1 tablet (500 mg total) by mouth daily as needed for mild pain or headache.   albuterol 108 (90 Base) MCG/ACT inhaler Commonly known as: VENTOLIN HFA SMARTSIG:2 Puff(s) By Mouth Every 4-6 Hours PRN   apixaban 5 MG Tabs tablet Commonly known as: ELIQUIS Take 1 tablet (5 mg total) by mouth 2  (two) times daily.   budesonide-formoterol 80-4.5 MCG/ACT inhaler Commonly known as: SYMBICORT Inhale 2 puffs into the lungs 2 (two) times daily.   carvedilol 25 MG tablet Commonly known as: COREG Take 1 tablet (25 mg total) by mouth 2 (two) times daily with a meal.   cefTRIAXone  IVPB Commonly known as: ROCEPHIN Inject 2 g into the vein daily. Indication:  Strep viridians bacteremia  First dose: yes Last Day of Therapy: 12/29/2021 Labs weekly on Monday while on IV antibiotics: CBC with differential, CMP Fax weekly lab results promptly to [phone] Please pull PICC at completion of IV antibiotics Method of administration: IV Push Method of administration may be changed at the discretion of home infusion pharmacist based upon assessment of the patient and/or caregiver's ability to self-administer the medication ordered.   diltiazem 30 MG tablet Commonly known as: CARDIZEM Take 1 tablet (30 mg total) by mouth 3 (three) times daily. What changed: when to take this   famotidine 20 MG tablet Commonly known as: PEPCID Take 20 mg by mouth 2 (two) times daily.   Iron (Ferrous Sulfate) 325 (65 Fe) MG Tabs Take 1 tablet by mouth daily with breakfast.   loratadine 10 MG tablet Commonly known as: CLARITIN Take 10 mg by mouth daily.   losartan 25 MG tablet Commonly known as: COZAAR Take 25 mg by mouth daily.   PreserVision AREDS 2 Caps Take 2 capsules by mouth daily.   rosuvastatin 20 MG tablet Commonly known as: CRESTOR Hold until outpatient f/u due to acute hepatitis. What changed:  how much to take how to take this when to take this additional instructions   tolterodine 4 MG 24 hr capsule Commonly known as: Detrol LA Take 1 capsule (4 mg total) by mouth daily.               Discharge Care Instructions  (From admission, onward)           Start     Ordered   11/22/21 0000  Change dressing on IV access line weekly and PRN  (Home infusion instructions -  [facility] )        11/22/21 1255             Follow-up Information     Tsosie Billing, MD Follow up on 12/05/2021.   Specialty: Infectious Diseases Why: 10.50am Contact information: [place] [address] [phone]         Dionisio David, MD. Go on 11/26/2021.   Specialty: Cardiology Why: @10 :45am Contact information: [place] [address] [phone]  Leone Haven, MD. Go on 11/29/2021.   Specialty: Family Medicine Why: @11 :Max Sane information: [address] [place] [address] [phone]                 Allergies  Allergen Reactions   Lisinopril Cough   Ace Inhibitors Other (See Comments)    Has been told to avoid these and any diuretics because "left kidney has shrunk up and doesn't work"     The results of significant diagnostics from this hospitalization (including imaging, microbiology, ancillary and laboratory) are listed below for reference.   Consultations:   Procedures/Studies: ECHOCARDIOGRAM COMPLETE  Result Date: 11/22/2021    ECHOCARDIOGRAM REPORT   Patient Name:   ASHANTIA AMARAL Sifuentes Date of Exam: 11/21/2021 Medical Rec #:  [id number]          Height:       69.0 in Accession #:    [id number]         Weight:       200.0 lb Date of Birth:  1950-09-15         BSA:          2.066 m Patient Age:    52 years           BP:           160/60 mmHg Patient Gender: F                  HR:           57 bpm. Exam Location:  [facility] Procedure: 2D Echo, Color Doppler and Cardiac Doppler Indications:     R78.81 Bacteremia  History:         Patient has prior history of Echocardiogram examinations, most                  recent 09/22/2020. CKD, stage III; Risk Factors:Hypertension. Pt                  tested positive for COVID-19 on 11/16/2021.  Sonographer:     Charmayne Sheer Referring Phys:  [id number] Tsosie Billing Diagnosing Phys: Neoma Laming  Sonographer  Comments: Suboptimal subcostal window. IMPRESSIONS  1. Left ventricular ejection fraction, by estimation, is 60 to 65%. The left ventricle has normal function. The left ventricle has no regional wall motion abnormalities. There is mild concentric left ventricular hypertrophy. Left ventricular diastolic parameters were normal.  2. Right ventricular systolic function is normal. The right ventricular size is normal.  3. Left atrial size was mildly dilated.  4. Right atrial size was mildly dilated.  5. The mitral valve is grossly normal. Mild mitral valve regurgitation.  6. The aortic valve is calcified. Aortic valve regurgitation is trivial. Conclusion(s)/Recommendation(s): No evidence of valvular vegetations on this transthoracic echocardiogram. Consider a transesophageal echocardiogram to exclude infective endocarditis if clinically indicated. FINDINGS  Left Ventricle: Left ventricular ejection fraction, by estimation, is 60 to 65%. The left ventricle has normal function. The left ventricle has no regional wall motion abnormalities. The left ventricular internal cavity size was normal in size. There is  mild concentric left ventricular hypertrophy. Left ventricular diastolic parameters were normal. Right Ventricle: The right ventricular size is normal. No increase in right ventricular wall thickness. Right ventricular systolic function is normal. Left Atrium: Left atrial size was mildly dilated. Right Atrium: Right atrial size was mildly dilated. Pericardium: There is no evidence of pericardial effusion. Mitral Valve: The mitral valve is grossly normal. Mild mitral valve regurgitation.  Tricuspid Valve: The tricuspid valve is grossly normal. Tricuspid valve regurgitation is mild. Aortic Valve: The aortic valve is calcified. Aortic valve regurgitation is trivial. Aortic valve mean gradient measures 8.0 mmHg. Aortic valve peak gradient measures 14.9 mmHg. Aortic valve area, by VTI measures 1.94 cm. Pulmonic Valve: The  pulmonic valve was grossly normal. Pulmonic valve regurgitation is trivial. Aorta: The aortic root, ascending aorta and aortic arch are all structurally normal, with no evidence of dilitation or obstruction. IAS/Shunts: No atrial level shunt detected by color flow Doppler.  LEFT VENTRICLE PLAX 2D LVIDd:         5.30 cm   Diastology LVIDs:         3.00 cm   LV e' medial:    5.98 cm/s LV PW:         1.20 cm   LV E/e' medial:  21.2 LV IVS:        1.00 cm   LV e' lateral:   7.83 cm/s LVOT diam:     2.10 cm   LV E/e' lateral: 16.2 LV SV:         95 LV SV Index:   46 LVOT Area:     3.46 cm  RIGHT VENTRICLE RV Basal diam:  3.90 cm RV S prime:     12.30 cm/s TAPSE (M-mode): 2.6 cm LEFT ATRIUM             Index        RIGHT ATRIUM           Index LA diam:        4.30 cm 2.08 cm/m   RA Area:     15.50 cm LA Vol (A2C):   71.1 ml 34.41 ml/m  RA Volume:   39.00 ml  18.88 ml/m LA Vol (A4C):   59.2 ml 28.65 ml/m LA Biplane Vol: 66.3 ml 32.09 ml/m  AORTIC VALVE                     PULMONIC VALVE AV Area (Vmax):    2.06 cm      PV Vmax:          1.22 m/s AV Area (Vmean):   2.00 cm      PV Vmean:         83.400 cm/s AV Area (VTI):     1.94 cm      PV VTI:           0.308 m AV Vmax:           193.00 cm/s   PV Peak grad:     6.0 mmHg AV Vmean:          131.000 cm/s  PV Mean grad:     3.0 mmHg AV VTI:            0.488 m       PR End Diast Vel: 4.49 msec AV Peak Grad:      14.9 mmHg AV Mean Grad:      8.0 mmHg LVOT Vmax:         115.00 cm/s LVOT Vmean:        75.800 cm/s LVOT VTI:          0.274 m LVOT/AV VTI ratio: 0.56  AORTA Ao Root diam: 3.20 cm MITRAL VALVE MV Area (PHT): 2.68 cm     SHUNTS MV Decel Time: 283 msec     Systemic VTI:  0.27 m MV E velocity: 127.00 cm/s  Systemic Diam: 2.10 cm MV A velocity: 102.00 cm/s MV E/A ratio:  1.25 Neoma Laming Electronically signed by Neoma Laming Signature Date/Time: 11/22/2021/12:53:58 PM    Final    [place] EKG SITE RITE  Result Date: 11/22/2021 If Site Rite image not attached,  placement could not be confirmed due to current cardiac rhythm.  [place] EKG SITE RITE  Result Date: 11/22/2021 If Site Rite image not attached, placement could not be confirmed due to current cardiac rhythm.  US ABDOMEN LIMITED RUQ (LIVER/GB)  Result Date: 11/17/2021 CLINICAL DATA:  71 year old female with abdominal pain and elevated LFTs. EXAM: ULTRASOUND ABDOMEN LIMITED RIGHT UPPER QUADRANT COMPARISON:  None Available. FINDINGS: Gallbladder: A 5 mm polyp is noted. There is no evidence cholelithiasis or acute cholecystitis. Common bile duct: Diameter: 5.8 mm. There is no evidence of intrahepatic or extrahepatic biliary dilatation. Liver: Slightly increased hepatic echogenicity noted. No other abnormalities are present. Portal vein is patent on color Doppler imaging with normal direction of blood flow towards the liver. Other: None. IMPRESSION: 1. No acute abnormality. 2. Slightly increased hepatic echogenicity which may represent mild hepatic steatosis. 3. 5 mm gallbladder polyp. No evidence of cholelithiasis or acute cholecystitis. Electronically Signed   By: Margarette Canada M.D.   On: 11/17/2021 18:38   DG Chest Port 1 View  Result Date: 11/17/2021 CLINICAL DATA:  Possible sepsis. Diagnosed with COVID-19 yesterday. Weakness and shortness of breath. EXAM: PORTABLE CHEST 1 VIEW COMPARISON:  11/16/2021. FINDINGS: Previous CABG surgery, stable. Cardiac silhouette is mildly enlarged. No mediastinal or hilar masses. Lungs are clear.  No convincing pleural effusion.  No pneumothorax. Skeletal structures are grossly intact. IMPRESSION: No acute cardiopulmonary disease. Electronically Signed   By: Lajean Manes M.D.   On: 11/17/2021 16:20   DG Chest 2 View  Result Date: 11/16/2021 CLINICAL DATA:  Shortness of breath EXAM: CHEST - 2 VIEW COMPARISON:  09/19/2020 FINDINGS: Transverse diameter of heart is slightly increased. Metallic sutures are seen in the sternum. There are no signs of pulmonary edema or focal  pulmonary consolidation. There is no pleural effusion or pneumothorax. IMPRESSION: There are no signs of pulmonary edema or focal pulmonary consolidation. Electronically Signed   By: Elmer Picker M.D.   On: 11/16/2021 14:58      Labs: BNP (last 3 results) Recent Labs    11/17/21 1558  BNP 782.4*   Basic Metabolic Panel: Recent Labs  Lab 11/18/21 0419 11/19/21 0442 11/20/21 0353 11/21/21 0550 11/22/21 1128  NA 138 139 140 137 139  K 3.6 3.5 3.3* 3.1* 3.5  CL 110 111 107 112* 113*  CO2 21* 21* 24 22 20*  GLUCOSE 99 95 86 91 108*  BUN 33* 26* 24* 22 16  CREATININE 1.82* 1.18* 1.14* 1.11* 1.04*  CALCIUM 7.8* 8.0* 8.0* 7.7* 8.2*  MG 1.9 2.0 2.3 2.0 1.9   Liver Function Tests: Recent Labs  Lab 11/17/21 1558 11/18/21 0419 11/19/21 0442 11/20/21 0353 11/21/21 0550  AST 6,502* 4,973* 2,046* 872* 392*  ALT 3,485* 3,196* 1,994* 1,292* 878*  ALKPHOS 132* 118 118 113 127*  BILITOT 1.5* 1.6* 1.4* 1.1 0.8  PROT 6.6 5.7* 5.1* 5.0* 5.0*  ALBUMIN 3.7 3.2* 2.8* 2.7* 2.7*   No results for input(s): "LIPASE", "AMYLASE" in the last 168 hours. No results for input(s): "AMMONIA" in the last 168 hours. CBC: Recent Labs  Lab 11/17/21 1558 11/18/21 0419 11/19/21 0442 11/20/21 0353 11/21/21 0550 11/22/21 1128  WBC 6.4 6.7 4.3 2.8* 3.3* 3.7*  NEUTROABS 5.6 5.7  3.5 1.9 2.0  --   HGB 10.6* 9.3* 8.9* 8.3* 8.2* 8.5*  HCT 33.5* 30.2* 28.7* 26.4* 26.0* 26.9*  MCV 80.5 82.3 81.5 80.7 80.2 80.5  PLT 138* 115* 107* 115* 118* 117*   Cardiac Enzymes: No results for input(s): "CKTOTAL", "CKMB", "CKMBINDEX", "TROPONINI" in the last 168 hours. BNP: Invalid input(s): "POCBNP" CBG: No results for input(s): "GLUCAP" in the last 168 hours. D-Dimer No results for input(s): "DDIMER" in the last 72 hours. Hgb A1c No results for input(s): "HGBA1C" in the last 72 hours. Lipid Profile No results for input(s): "CHOL", "HDL", "LDLCALC", "TRIG", "CHOLHDL", "LDLDIRECT" in the last 72  hours. Thyroid function studies No results for input(s): "TSH", "T4TOTAL", "T3FREE", "THYROIDAB" in the last 72 hours.  Invalid input(s): "FREET3" Anemia work up Recent Labs    11/21/21 0550  VITAMINB12 2,411*  FOLATE 19.0  FERRITIN 331*   Urinalysis    Component Value Date/Time   COLORURINE AMBER (A) 11/17/2021 1748   APPEARANCEUR CLOUDY (A) 11/17/2021 1748   LABSPEC 1.018 11/17/2021 1748   PHURINE 5.0 11/17/2021 [place] 11/17/2021 [place] 09/07/2018 1403   HGBUR MODERATE (A) 11/17/2021 1748   HGBUR trace-intact 02/10/2010 [place] 11/17/2021 [place] neg 08/27/2018 [place] 11/17/2021 1748   PROTEINUR 100 (A) 11/17/2021 1748   UROBILINOGEN 2.0 (A) 09/07/2018 1403   NITRITE NEGATIVE 11/17/2021 1748   LEUKOCYTESUR NEGATIVE 11/17/2021 1748   Sepsis Labs Recent Labs  Lab 11/19/21 0442 11/20/21 0353 11/21/21 0550 11/22/21 1128  WBC 4.3 2.8* 3.3* 3.7*   Microbiology Recent Results (from the past 240 hour(s))  Resp Panel by RT-PCR (Flu A&B, Covid) Anterior Nasal Swab     Status: Abnormal   Collection Time: 11/16/21  3:02 PM   Specimen: Anterior Nasal Swab  Result Value Ref Range Status   SARS Coronavirus 2 by RT PCR POSITIVE (A) NEGATIVE Final    Comment: (NOTE) SARS-CoV-2 target nucleic acids are DETECTED.  The SARS-CoV-2 RNA is generally detectable in upper respiratory specimens during the acute phase of infection. Positive results are indicative of the presence of the identified virus, but do not rule out bacterial infection or co-infection with other pathogens not detected by the test. Clinical correlation with patient history and other diagnostic information is necessary to determine patient infection status. The expected result is Negative.  Fact Sheet for Patients: EntrepreneurPulse.com.au  Fact Sheet for Healthcare  Providers: IncredibleEmployment.be  This test is not yet approved or cleared by the [place] FDA and  has been authorized for detection and/or diagnosis of SARS-CoV-2 by FDA under an Emergency Use Authorization (EUA).  This EUA will remain in effect (meaning this test can be used) for the duration of  the COVID-19 declaration under Section 564(b)(1) of the A ct, 21 U.S.C. section 360bbb-3(b)(1), unless the authorization is terminated or revoked sooner.     Influenza A by PCR NEGATIVE NEGATIVE Final   Influenza B by PCR NEGATIVE NEGATIVE Final    Comment: (NOTE) The Xpert Xpress SARS-CoV-2/FLU/RSV plus assay is intended as an aid in the diagnosis of influenza from Nasopharyngeal swab specimens and should not be used as a sole basis for treatment. Nasal washings and aspirates are unacceptable for Xpert Xpress SARS-CoV-2/FLU/RSV testing.  Fact Sheet for Patients: EntrepreneurPulse.com.au  Fact Sheet for Healthcare Providers: IncredibleEmployment.be  This test is not yet approved or cleared by the [place] FDA and has been authorized for detection and/or  diagnosis of SARS-CoV-2 by FDA under an Emergency Use Authorization (EUA). This EUA will remain in effect (meaning this test can be used) for the duration of the COVID-19 declaration under Section 564(b)(1) of the Act, 21 U.S.C. section 360bbb-3(b)(1), unless the authorization is terminated or revoked.  Performed at [facility], [address] [place], [place] [address]   Blood Culture (routine x 2)     Status: Abnormal   Collection Time: 11/17/21  3:58 PM   Specimen: Left Antecubital; Blood  Result Value Ref Range Status   Specimen Description   Final    LEFT ANTECUBITAL Performed at [facility], [address] [place], [place] [address]    Special Requests   Final    BOTTLES DRAWN AEROBIC AND ANAEROBIC Blood Culture adequate  volume Performed at [facility], [address] [place], [place] [address]    Culture  Setup Time   Final    GRAM POSITIVE COCCI IN BOTH AEROBIC AND ANAEROBIC BOTTLES CRITICAL RESULT CALLED TO, READ BACK BY AND VERIFIED WITHVioleta Gelinas [facility] 7124 11/18/21 AT Performed at [facility], [place] [address] [place], [place] [address]    Culture VIRIDANS STREPTOCOCCUS (A)  Final   Report Status 11/20/2021 FINAL  Final   Organism ID, Bacteria VIRIDANS STREPTOCOCCUS  Final      Susceptibility   Viridans streptococcus - MIC*    PENICILLIN <=0.06 SENSITIVE Sensitive     CEFTRIAXONE <=0.12 SENSITIVE Sensitive     ERYTHROMYCIN <=0.12 SENSITIVE Sensitive     LEVOFLOXACIN 1 SENSITIVE Sensitive     VANCOMYCIN 0.5 SENSITIVE Sensitive     * VIRIDANS STREPTOCOCCUS  Blood Culture ID Panel (Reflexed)     Status: Abnormal   Collection Time: 11/17/21  3:58 PM  Result Value Ref Range Status   Enterococcus faecalis NOT DETECTED NOT DETECTED Final   Enterococcus Faecium NOT DETECTED NOT DETECTED Final   Listeria monocytogenes NOT DETECTED NOT DETECTED Final   Staphylococcus species NOT DETECTED NOT DETECTED Final   Streptococcus species DETECTED (A) NOT DETECTED Final    Comment: Not Enterococcus species, Streptococcus agalactiae, Streptococcus pyogenes, or Streptococcus pneumoniae. CRITICAL RESULT CALLED TO, READ BACK BY AND VERIFIED WITH: JASON ROBBINS 11/19/21 0345 AT    Streptococcus agalactiae NOT DETECTED NOT DETECTED Final   Streptococcus pneumoniae NOT DETECTED NOT DETECTED Final   Streptococcus pyogenes NOT DETECTED NOT DETECTED Final   A.calcoaceticus-baumannii NOT DETECTED NOT DETECTED Final   Bacteroides fragilis NOT DETECTED NOT DETECTED Final   Enterobacterales NOT DETECTED NOT DETECTED Final   Enterobacter cloacae complex NOT DETECTED NOT DETECTED Final   Escherichia coli NOT DETECTED NOT DETECTED Final   Klebsiella aerogenes NOT DETECTED NOT DETECTED Final    Klebsiella oxytoca NOT DETECTED NOT DETECTED Final   Klebsiella pneumoniae NOT DETECTED NOT DETECTED Final   Proteus species NOT DETECTED NOT DETECTED Final   Salmonella species NOT DETECTED NOT DETECTED Final   Serratia marcescens NOT DETECTED NOT DETECTED Final   Haemophilus influenzae NOT DETECTED NOT DETECTED Final   Neisseria meningitidis NOT DETECTED NOT DETECTED Final   Pseudomonas aeruginosa NOT DETECTED NOT DETECTED Final   Stenotrophomonas maltophilia NOT DETECTED NOT DETECTED Final   Candida albicans NOT DETECTED NOT DETECTED Final   Candida auris NOT DETECTED NOT DETECTED Final   Candida glabrata NOT DETECTED NOT DETECTED Final   Candida krusei NOT DETECTED NOT DETECTED Final   Candida parapsilosis NOT DETECTED NOT DETECTED Final   Candida tropicalis NOT DETECTED NOT  DETECTED Final   Cryptococcus neoformans/gattii NOT DETECTED NOT DETECTED Final    Comment: Performed at [facility], [address] [place], [place] [address]  Blood Culture (routine x 2)     Status: Abnormal   Collection Time: 11/17/21  5:48 PM   Specimen: BLOOD RIGHT ARM  Result Value Ref Range Status   Specimen Description   Final    BLOOD RIGHT ARM Performed at [facility], [address], [place], [place] [address]    Special Requests   Final    BOTTLES DRAWN AEROBIC AND ANAEROBIC Blood Culture adequate volume Performed at [facility], [address], [place], [place] [address]    Culture  Setup Time   Final    GRAM POSITIVE COCCI IN BOTH AEROBIC AND ANAEROBIC BOTTLES CRITICAL VALUE NOTED.  VALUE IS CONSISTENT WITH PREVIOUSLY REPORTED AND CALLED VALUE. Performed at [facility], [address] [place], [place] [address]    Culture (A)  Final    VIRIDANS STREPTOCOCCUS SUSCEPTIBILITIES PERFORMED ON PREVIOUS CULTURE WITHIN THE LAST 5 DAYS. Performed at [facility], [place] [address] [place], [place] [address]    Report Status 11/20/2021 FINAL  Final   Culture, blood (Routine X 2) w Reflex to ID Panel     Status: None (Preliminary result)   Collection Time: 11/20/21  3:53 AM   Specimen: BLOOD  Result Value Ref Range Status   Specimen Description BLOOD RIGHT ARM  Final   Special Requests   Final    BOTTLES DRAWN AEROBIC AND ANAEROBIC Blood Culture adequate volume   Culture   Final    NO GROWTH 2 DAYS Performed at [facility], [address] [place], [place] [address]    Report Status PENDING  Incomplete  Culture, blood (Routine X 2) w Reflex to ID Panel     Status: None (Preliminary result)   Collection Time: 11/20/21  3:54 AM   Specimen: BLOOD  Result Value Ref Range Status   Specimen Description BLOOD RIGHT HAND  Final   Special Requests   Final    BOTTLES DRAWN AEROBIC AND ANAEROBIC Blood Culture results may not be optimal due to an excessive volume of blood received in culture bottles   Culture   Final    NO GROWTH 2 DAYS Performed at [facility], [address] [place], [place] [address]    Report Status PENDING  Incomplete     Total time spend on discharging this patient, including the last patient exam, discussing the hospital stay, instructions for ongoing care as it relates to all pertinent caregivers, as well as preparing the medical discharge records, prescriptions, and/or referrals as applicable, is 45 minutes.    Enzo Bi, MD  [facility] 11/22/2021, 3:37 PM

## 2021-11-22 NOTE — Progress Notes (Signed)
Peripherally Inserted Central Catheter Placement  The IV Nurse has discussed with the patient and/or persons authorized to consent for the patient, the purpose of this procedure and the potential benefits and risks involved with this procedure.  The benefits include less needle sticks, lab draws from the catheter, and the patient may be discharged home with the catheter. Risks include, but not limited to, infection, bleeding, blood clot (thrombus formation), and puncture of an artery; nerve damage and irregular heartbeat and possibility to perform a PICC exchange if needed/ordered by physician.  Alternatives to this procedure were also discussed.  Bard Power PICC patient education guide, fact sheet on infection prevention and patient information card has been provided to patient /or left at bedside.    PICC Placement Documentation        Audrey Wolf 11/22/2021, 3:05 PM

## 2021-11-23 DIAGNOSIS — R7881 Bacteremia: Secondary | ICD-10-CM | POA: Diagnosis not present

## 2021-11-23 LAB — EPSTEIN BARR VRS(EBV DNA BY PCR): EBV DNA QN by PCR: NEGATIVE IU/mL

## 2021-11-24 DIAGNOSIS — R7881 Bacteremia: Secondary | ICD-10-CM | POA: Diagnosis not present

## 2021-11-25 ENCOUNTER — Other Ambulatory Visit: Payer: Self-pay | Admitting: Family Medicine

## 2021-11-25 DIAGNOSIS — R7881 Bacteremia: Secondary | ICD-10-CM | POA: Diagnosis not present

## 2021-11-25 LAB — CULTURE, BLOOD (ROUTINE X 2)
Culture: NO GROWTH
Culture: NO GROWTH
Special Requests: ADEQUATE

## 2021-11-26 ENCOUNTER — Telehealth: Payer: Medicare Other

## 2021-11-26 ENCOUNTER — Telehealth: Payer: Self-pay

## 2021-11-26 ENCOUNTER — Telehealth: Payer: Self-pay | Admitting: Family Medicine

## 2021-11-26 ENCOUNTER — Emergency Department
Admission: EM | Admit: 2021-11-26 | Discharge: 2021-11-26 | Disposition: A | Discharge status: Home / Self Care | Payer: BC Managed Care – PPO | Attending: Emergency Medicine | Admitting: Emergency Medicine

## 2021-11-26 ENCOUNTER — Other Ambulatory Visit: Payer: Self-pay

## 2021-11-26 DIAGNOSIS — I509 Heart failure, unspecified: Secondary | ICD-10-CM | POA: Diagnosis not present

## 2021-11-26 DIAGNOSIS — R7881 Bacteremia: Secondary | ICD-10-CM | POA: Diagnosis not present

## 2021-11-26 DIAGNOSIS — B179 Acute viral hepatitis, unspecified: Secondary | ICD-10-CM | POA: Diagnosis not present

## 2021-11-26 DIAGNOSIS — E876 Hypokalemia: Secondary | ICD-10-CM | POA: Insufficient documentation

## 2021-11-26 DIAGNOSIS — Z452 Encounter for adjustment and management of vascular access device: Secondary | ICD-10-CM | POA: Diagnosis not present

## 2021-11-26 DIAGNOSIS — N189 Chronic kidney disease, unspecified: Secondary | ICD-10-CM | POA: Diagnosis not present

## 2021-11-26 DIAGNOSIS — R781 Finding of opiate drug in blood: Secondary | ICD-10-CM | POA: Diagnosis not present

## 2021-11-26 LAB — COMPREHENSIVE METABOLIC PANEL
ALT: 175 U/L — ABNORMAL HIGH (ref 0–44)
AST: 81 U/L — ABNORMAL HIGH (ref 15–41)
Albumin: 3 g/dL — ABNORMAL LOW (ref 3.5–5.0)
Alkaline Phosphatase: 131 U/L — ABNORMAL HIGH (ref 38–126)
Anion gap: 9 (ref 5–15)
BUN: 18 mg/dL (ref 8–23)
CO2: 21 mmol/L — ABNORMAL LOW (ref 22–32)
Calcium: 8.5 mg/dL — ABNORMAL LOW (ref 8.9–10.3)
Chloride: 109 mmol/L (ref 98–111)
Creatinine, Ser: 1.41 mg/dL — ABNORMAL HIGH (ref 0.44–1.00)
GFR, Estimated: 40 mL/min — ABNORMAL LOW (ref >60)
Glucose, Bld: 121 mg/dL — ABNORMAL HIGH (ref 70–99)
Potassium: 2.9 mmol/L — ABNORMAL LOW (ref 3.5–5.1)
Sodium: 139 mmol/L (ref 135–145)
Total Bilirubin: 0.8 mg/dL (ref 0.3–1.2)
Total Protein: 5.7 g/dL — ABNORMAL LOW (ref 6.5–8.1)

## 2021-11-26 LAB — CBC
HCT: 28.4 % — ABNORMAL LOW (ref 36.0–46.0)
Hemoglobin: 8.9 g/dL — ABNORMAL LOW (ref 12.0–15.0)
MCH: 25.2 pg — ABNORMAL LOW (ref 26.0–34.0)
MCHC: 31.3 g/dL (ref 30.0–36.0)
MCV: 80.5 fL (ref 80.0–100.0)
Platelets: 174 10*3/uL (ref 150–400)
RBC: 3.53 MIL/uL — ABNORMAL LOW (ref 3.87–5.11)
RDW: 16.1 % — ABNORMAL HIGH (ref 11.5–15.5)
WBC: 4.1 10*3/uL (ref 4.0–10.5)
nRBC: 0 % (ref 0.0–0.2)

## 2021-11-26 LAB — CMV DNA, QUANTITATIVE, PCR
CMV DNA Quant: NEGATIVE IU/mL
Log10 CMV Qn DNA Pl: UNDETERMINED log10 IU/mL

## 2021-11-26 LAB — LACTIC ACID, PLASMA: Lactic Acid, Venous: 1.3 mmol/L (ref 0.5–1.9)

## 2021-11-26 MED ORDER — POTASSIUM CHLORIDE 10 MEQ/100ML IV SOLN
10.0000 meq | Freq: Once | INTRAVENOUS | Status: AC
  Administered 2021-11-26: 10 meq via INTRAVENOUS
  Filled 2021-11-26: qty 100

## 2021-11-26 MED ORDER — SODIUM CHLORIDE 0.9 % IV BOLUS
1000.0000 mL | Freq: Once | INTRAVENOUS | Status: AC
  Administered 2021-11-26: 1000 mL via INTRAVENOUS

## 2021-11-26 MED ORDER — POTASSIUM CHLORIDE CRYS ER 20 MEQ PO TBCR
40.0000 meq | EXTENDED_RELEASE_TABLET | Freq: Once | ORAL | Status: AC
  Administered 2021-11-26: 40 meq via ORAL
  Filled 2021-11-26: qty 2

## 2021-11-26 MED ORDER — SODIUM CHLORIDE 0.9 % IV SOLN
2.0000 g | INTRAVENOUS | Status: DC
  Administered 2021-11-26: 2 g via INTRAVENOUS
  Filled 2021-11-26: qty 20

## 2021-11-26 NOTE — Progress Notes (Signed)
At bedside to assess PICC.  PICC found to be at 0 cm.  Per family member dressing changed today by home health nurse.  New dressing with the CHG gel instead of the biopatch that was used in the hospital.  Family member states that there had been blood that backed up into the syringe when she was flushing it at home which frightened her.  Flushed PICC with no leaking noted at site.  Brisk blood return, easy flush.  Dressing removed and site cleaned.  No leaking or blood noted to be coming from insertion site.  PICC placed back at 1 cm out (this is how it was left at placement).  Family member flushed the attached saline syringe.  No leaking of fluid or blood noted.  Dressing change completed after family took picture of proper PICC exit site.  Family member and patient comfortable with the condition of the PICC, understands that the "fluid" thought to be under the dressing was the CHG gel dressing.  Discussed what to do if blood backed up back into the syringe again in the future.  Spoke with bedside nurse and [place], [place] about above findings.

## 2021-11-26 NOTE — Telephone Encounter (Signed)
Received voicemail from patient's daughter in law stating that patient had PICC dressing change this morning. She stated that when they later flushed the line that the dressing became wet with saline. They notified the home health nurse who advised them to call our office.   Attempted to call [place], no answer and no voicemail set up. Called the patient, no answer. Left HIPAA compliant voicemail requesting callback.   Patient is currently checked into the [facility] ED and will need to have PICC placement checked.   Beryle Flock, RN

## 2021-11-26 NOTE — Telephone Encounter (Signed)
Patient's daughter in-law, Jeannie Fend called. She was giving patient her antibiotics through a pick line, she notice blood and clear fluid around the pick line. She called home health and they advised her to go to infection disease. [place] stated that patient can not walk very well and had no way to get her there. She wanted advise from patient's provider. Dr Caryl Bis advised that patient go to the ED to have another one put in. Caryl Comes was advised to call 911 for a non-emergency transport to ED.

## 2021-11-26 NOTE — ED Triage Notes (Addendum)
Pt comes with c/o picc line coming out. Pt states it has been fine until the dressing was changed today. Pt states the dressing is full of fluid. Bruising is noted to arm and family reports that has been there.   Pt states nausea but that has been going on since her being admitted and having picc placed due to infection.   Pt states she has only been able to keep fluid down. Pt does appear pale. Pt denies any vomiting.

## 2021-11-26 NOTE — ED Provider Notes (Signed)
[facility] Provider Note    Event Date/Time   First MD Initiated Contact with Patient 11/26/21 1504     (approximate)   History   Vascular Access Problem   HPI  Audrey Wolf is a 71 y.o. female with history of CHF, CKD, GERD, and recent admission for bacteremia due to Strep presents to the emergency department for PICC malfunction.  Granddaughter had attempted to administer the antibiotic but noticed that there was fluid under the Tegaderm.  She also had some blood that backed up into the syringe and became concerned that the PICC line was not in the correct place.  The dressing had been changed by home health and did not have the same security device that it had prior to.      Physical Exam   Triage Vital Signs: ED Triage Vitals  Enc Vitals Group     BP 11/26/21 1424 (!) 93/51     Pulse Rate 11/26/21 1423 63     Resp 11/26/21 1423 18     Temp 11/26/21 1423 98.6 F (37 C)     Temp src --      SpO2 11/26/21 1423 98 %     Weight --      Height --      Head Circumference --      Peak Flow --      Pain Score 11/26/21 1422 0     Pain Loc --      Pain Edu? --      Excl. in [place]? --     Most recent vital signs: Vitals:   11/26/21 1424 11/26/21 1622  BP: (!) 93/51 136/61  Pulse:  65  Resp:  (!) 21  Temp:    SpO2:  95%     General: Awake, no distress.  Pale CV:  Good peripheral perfusion.  Resp:  Normal effort.  Abd:  No distention.  Other:  Ecchymosis at site of PICC insertion on the left upper arm.   ED Results / Procedures / Treatments   Labs (all labs ordered are listed, but only abnormal results are displayed) Labs Reviewed  CBC - Abnormal; Notable for the following components:      Result Value   RBC 3.53 (*)    Hemoglobin 8.9 (*)    HCT 28.4 (*)    MCH 25.2 (*)    RDW 16.1 (*)    All other components within normal limits  COMPREHENSIVE METABOLIC PANEL - Abnormal; Notable for the following components:   Potassium  2.9 (*)    CO2 21 (*)    Glucose, Bld 121 (*)    Creatinine, Ser 1.41 (*)    Calcium 8.5 (*)    Total Protein 5.7 (*)    Albumin 3.0 (*)    AST 81 (*)    ALT 175 (*)    Alkaline Phosphatase 131 (*)    GFR, Estimated 40 (*)    All other components within normal limits  LACTIC ACID, PLASMA  LACTIC ACID, PLASMA     EKG  Not indicated.   RADIOLOGY    PROCEDURES:  Critical Care performed: No  Procedures   MEDICATIONS ORDERED IN ED: Medications  cefTRIAXone (ROCEPHIN) 2 g in sodium chloride 0.9 % 100 mL IVPB (0 g Intravenous Stopped 11/26/21 1602)  sodium chloride 0.9 % bolus 1,000 mL (1,000 mLs Intravenous New Bag/Given 11/26/21 1531)  potassium chloride 10 mEq in 100 mL IVPB (0 mEq Intravenous Stopped 11/26/21 1729)  potassium chloride SA (KLOR-CON M) CR tablet 40 mEq (40 mEq Oral Given 11/26/21 1624)     IMPRESSION / MDM / ASSESSMENT AND PLAN / ED COURSE  I reviewed the triage vital signs and the nursing notes.                              Differential diagnosis includes, but is not limited to PICC line malfunction, need for PICC line education, sepsis, worsening bacteremia  Patient's presentation is most consistent with acute illness / injury with system symptoms.  71 year old female presenting to the emergency department for treatment and evaluation of PICC line that is not functioning properly.  See HPI for further details.  Triage vitals indicate hypotension.  Granddaughter states that her vital signs were normal at home.  Patient does not appear that she feels well overall but states that she does not feel any worse today than she did yesterday.  She denies having had a fever over the past day or so.  She denies pain.  Plan will be to give her some fluids, monitor blood pressure, repeat labs including a lactic, administer her dose of Rocephin, and have the IV team evaluate the PICC line.  Clinical Course as of 11/26/21 1801  Tue Nov 26, 2021  1600 IV team  has assessed and redressed PICC. Line is now flushing. RN provided education to family for home care.  Patient will get her dose of Rocephin while here. Review of labs today show hypokalemia at 2.9. She will receive Kdur po and IV potassium.  Liver enzymes have significantly improved from 5 days ago.  [CT]  [id number] Blood pressures have remained stable.  It is possible that the hypotension documented in triage was  inaccurate.  Granddaughter states that she was moving when the blood pressure cuff was reading. We discussed option of admission for further observation, fluids, and antibiotic therapy however the patient would prefer to be discharged home.  This seems reasonable since her blood pressure is stable, her lactate is normal, her white blood cell count is normal and other labs are generally improved from her last blood draw for 5 days ago.  ER return precautions were discussed.  Patient is to continue her IV therapy as prescribed and keep her scheduled follow-up appointments. [CT]    Clinical Course User Index [CT] Kayla Deshaies B, FNP     FINAL CLINICAL IMPRESSION(S) / ED DIAGNOSES   Final diagnoses:  Encounter for venous access device care  Hypokalemia     Rx / DC Orders   ED Discharge Orders     None        Note:  This document was prepared using Dragon voice recognition software and may include unintentional dictation errors.   Victorino Dike, FNP 11/26/21 1801    Naaman Plummer, MD 11/26/21 [phone]

## 2021-11-26 NOTE — ED Notes (Signed)
IV team nurse at bedside assessing patient.

## 2021-11-26 NOTE — Telephone Encounter (Signed)
Transition Care Management Unsuccessful Follow-up Telephone Call  Date of discharge and from where:  11/22/21 [facility]  Attempts:  1st Attempt  Reason for unsuccessful TCM follow-up call:  Unable to reach patient. HFU scheduled 11/29/21 @ 11:00. Will follow.   Cadence Haslam Motley LPN, [place], [place] Direct dial ??[phone]

## 2021-11-27 DIAGNOSIS — R7881 Bacteremia: Secondary | ICD-10-CM | POA: Diagnosis not present

## 2021-11-27 NOTE — Telephone Encounter (Signed)
Per ED vascular team note, issue has been resolved and patient/family educated about CHG pad under dressing.  Beryle Flock, RN

## 2021-11-27 NOTE — Telephone Encounter (Signed)
Transition Care Management Follow-up Telephone Call Date of discharge and from where: 11/22/21 [facility] How have you been since you were released from the hospital? Patient states,"I am still a little swimmy headed but taking my time with everything. Appetite not as good but eating and drinking okay." Denies falls since discharge, n/v/d, pain, confusion, fever and all other harmful symptoms. Non productive cough.  Any questions or concerns? No  Items Reviewed: Did the pt receive and understand the discharge instructions provided? Yes  Medications obtained and verified? Yes .stop taking amiodarone.  Please also hold your Crestor until followup with your outpatient doctor. Other:  [facility] pharmacist to adjust dose for Vancomycin, Aminoglycosides and other anti-infective therapies as requested by physician.   Complete by: As directed       [facility] to provide Cath Flo 56m   Complete by: As directed      Administer for PICC line occlusion and as ordered by physician for other access device issues.    Anaphylaxis Kit: Provided to treat any anaphylactic reaction to the medication being provided to the patient if First Dose or when requested by physician      Any new allergies since your discharge? No  Dietary orders reviewed? Yes, heart healthy.  Do you have support at home? Yes , family support.   Home Care and Equipment/Supplies: Were home health services ordered? Yes, twice weekly. If so, what is the name of the agency? [facility] Has the agency set up a time to come to the patient's home? yes Do you have any questions related to the use of the equipment or supplies? No  Functional Questionnaire: (I = Independent and D = Dependent) ADLs: Daughter in law to assist as needed.   Bathing/Dressing- I  Meal Prep- I  Eating- I  Maintaining continence- I  Transferring/Ambulation- I  Managing Meds- I  Follow up appointments reviewed:  PCP Hospital f/u appt  confirmed? Yes  Scheduled to see PCP on 11/29/21 @ 11:00. Specialist Hospital f/u appt Infectious confirmed? Yes  Scheduled to see Infectious disease on 12/05/21 and Cardiology.   Are transportation arrangements needed? No  If their condition worsens, is the pt aware to call PCP or go to the Emergency Dept.? Yes Was the patient provided with contact information for the PCP's office or ED? Yes Was to pt encouraged to call back with questions or concerns? Yes   Angelissa Supan Motley LPN, [place] ??[phone]

## 2021-11-28 DIAGNOSIS — R7881 Bacteremia: Secondary | ICD-10-CM | POA: Diagnosis not present

## 2021-11-29 ENCOUNTER — Ambulatory Visit (INDEPENDENT_AMBULATORY_CARE_PROVIDER_SITE_OTHER): Payer: BC Managed Care – PPO | Admitting: Family Medicine

## 2021-11-29 ENCOUNTER — Encounter: Payer: Self-pay | Admitting: Family Medicine

## 2021-11-29 VITALS — BP 130/70 | HR 73 | Temp 98.3°F | Ht 69.0 in | Wt 197.6 lb

## 2021-11-29 DIAGNOSIS — R7989 Other specified abnormal findings of blood chemistry: Secondary | ICD-10-CM | POA: Diagnosis not present

## 2021-11-29 DIAGNOSIS — U071 COVID-19: Secondary | ICD-10-CM

## 2021-11-29 DIAGNOSIS — S8001XD Contusion of right knee, subsequent encounter: Secondary | ICD-10-CM | POA: Diagnosis not present

## 2021-11-29 DIAGNOSIS — R7881 Bacteremia: Secondary | ICD-10-CM

## 2021-11-29 DIAGNOSIS — I480 Paroxysmal atrial fibrillation: Secondary | ICD-10-CM | POA: Diagnosis not present

## 2021-11-29 DIAGNOSIS — B179 Acute viral hepatitis, unspecified: Secondary | ICD-10-CM

## 2021-11-29 DIAGNOSIS — B955 Unspecified streptococcus as the cause of diseases classified elsewhere: Secondary | ICD-10-CM | POA: Diagnosis not present

## 2021-11-29 DIAGNOSIS — T148XXA Other injury of unspecified body region, initial encounter: Secondary | ICD-10-CM | POA: Insufficient documentation

## 2021-11-29 LAB — COMPREHENSIVE METABOLIC PANEL
ALT: 119 U/L — ABNORMAL HIGH (ref 0–35)
AST: 74 U/L — ABNORMAL HIGH (ref 0–37)
Albumin: 3.3 g/dL — ABNORMAL LOW (ref 3.5–5.2)
Alkaline Phosphatase: 176 U/L — ABNORMAL HIGH (ref 39–117)
BUN: 16 mg/dL (ref 6–23)
CO2: 23 mEq/L (ref 19–32)
Calcium: 8.5 mg/dL (ref 8.4–10.5)
Chloride: 108 mEq/L (ref 96–112)
Creatinine, Ser: 1.14 mg/dL (ref 0.40–1.20)
GFR: 48.29 mL/min — ABNORMAL LOW (ref >60.00)
Glucose, Bld: 100 mg/dL — ABNORMAL HIGH (ref 70–99)
Potassium: 3.4 mEq/L — ABNORMAL LOW (ref 3.5–5.1)
Sodium: 140 mEq/L (ref 135–145)
Total Bilirubin: 0.6 mg/dL (ref 0.2–1.2)
Total Protein: 5.4 g/dL — ABNORMAL LOW (ref 6.0–8.3)

## 2021-11-29 NOTE — Patient Instructions (Signed)
Nice to see you. We will contact you with your lab results.  Your lab results will help determine what cough medication you can take. If you develop increasing shortness of breath, cough productive of blood, fevers, or any issues with your PICC line please seek medical attention.

## 2021-11-29 NOTE — Assessment & Plan Note (Signed)
Seem to be in sinus rhythm today.  She will continue Eliquis 5 mg twice daily, diltiazem 30 mg 3 times daily, and carvedilol 25 mg twice daily.

## 2021-11-29 NOTE — Assessment & Plan Note (Signed)
Hematoma of right knee with surrounding bruising.  No signs of infection.  Discussed it would take months for this to resolve.  Advised to let us know if there is any erythema, excessive warmth, or increasing size.

## 2021-11-29 NOTE — Assessment & Plan Note (Signed)
Likely related to shock liver.  We will check liver enzymes today.

## 2021-11-29 NOTE — Progress Notes (Signed)
Audrey Rumps, MD Phone: [phone]  Audrey Wolf is a 71 y.o. female who presents today for f/u.  COVID-19/strep viridans bacteremia: Patient initially felt ill on 10/13 with fevers, chills, productive cough, generalized weakness, malaise, and fatigue.  She was evaluated in the emergency room on 10/14 diagnosed with COVID.  She continued to feel poorly and presented back to the hospital the next day.  She was found to have bacteremia due to strep viridans.  They suspect this is related to poor dentition.  She was initially placed on IV vancomycin and this was switched to ceftriaxone.  She had repeat blood cultures that were negative.  Infectious disease was involved in her care.  She has follow-up with them on 11/2.  She also needs a TEE though due to her COVID infection she was not able to have this during hospitalization and it is scheduled for 10/30.  She was found to have acute hepatitis likely related to shock liver.  GI was involved and did not indicate any further work-up.  Her amiodarone was discontinued as it could contribute to hepatitis.  Liver enzymes were downtrending at time of discharge.  She also had acute kidney injury which improved status post IV fluids.  They were unable to treat her with any oral anti-COVID medications given her liver and kidney issues.  Since discharge she has had some weakness and continues to have cough, shortness of breath, and some wheezing.  She notes the shortness of breath has not worsened significantly from having COVID though is worse than her typical baseline.  She has been using her inhalers.  She has not had any fevers.  She did have an issue with her PICC line and had to go back to the emergency room to have this evaluated by the IV team.  Fall: Patient notes she had a fall the day prior to her hospitalization where she landed on her right leg.  She had bruising up and down her leg and has a large knot on her right knee.  She notes no pain at  this time with this.  Social History   Tobacco Use  Smoking Status Never  Smokeless Tobacco Never    Current Outpatient Medications on File Prior to Visit  Medication Sig Dispense Refill   acetaminophen (TYLENOL) 500 MG tablet Take 1 tablet (500 mg total) by mouth daily as needed for mild pain or headache. 30 tablet 0   albuterol (VENTOLIN HFA) 108 (90 Base) MCG/ACT inhaler SMARTSIG:2 Puff(s) By Mouth Every 4-6 Hours PRN     budesonide-formoterol (SYMBICORT) 80-4.5 MCG/ACT inhaler Inhale 2 puffs into the lungs 2 (two) times daily. 1 each 3   carvedilol (COREG) 25 MG tablet Take 1 tablet (25 mg total) by mouth 2 (two) times daily with a meal. 180 tablet 0   cefTRIAXone (ROCEPHIN) IVPB Inject 2 g into the vein daily. Indication:  Strep viridians bacteremia  First dose: yes Last Day of Therapy: 12/29/2021 Labs weekly on Monday while on IV antibiotics: CBC with differential, CMP Fax weekly lab results promptly to [phone] Please pull PICC at completion of IV antibiotics Method of administration: IV Push Method of administration may be changed at the discretion of home infusion pharmacist based upon assessment of the patient and/or caregiver's ability to self-administer the medication ordered. 38 Units 0   diltiazem (CARDIZEM) 30 MG tablet Take 1 tablet (30 mg total) by mouth 3 (three) times daily. 90 tablet 2   famotidine (PEPCID) 20 MG tablet Take  20 mg by mouth 2 (two) times daily.     Iron, Ferrous Sulfate, 325 (65 Fe) MG TABS Take 1 tablet by mouth daily with breakfast.     loratadine (CLARITIN) 10 MG tablet Take 10 mg by mouth daily.      losartan (COZAAR) 25 MG tablet Take 25 mg by mouth daily.     Multiple Vitamins-Minerals (PRESERVISION AREDS 2) CAPS Take 2 capsules by mouth daily.     rosuvastatin (CRESTOR) 20 MG tablet Take 1 tablet by mouth once daily 90 tablet 0   tolterodine (DETROL LA) 4 MG 24 hr capsule Take 1 capsule (4 mg total) by mouth daily. 30 capsule 11    apixaban (ELIQUIS) 5 MG TABS tablet Take 1 tablet (5 mg total) by mouth 2 (two) times daily. 60 tablet 2   No current facility-administered medications on file prior to visit.     ROS see history of present illness  Objective  Physical Exam Vitals:   11/29/21 1119  BP: 130/70  Pulse: 73  Temp: 98.3 F (36.8 C)  SpO2: 98%    BP Readings from Last 3 Encounters:  11/29/21 130/70  11/26/21 (!) 157/60  11/22/21 (!) 149/65   Wt Readings from Last 3 Encounters:  11/29/21 197 lb 9.6 oz (89.6 kg)  11/18/21 200 lb (90.7 kg)  11/06/21 212 lb 9.6 oz (96.4 kg)    Physical Exam Constitutional:      General: She is not in acute distress.    Appearance: She is not diaphoretic.  Cardiovascular:     Rate and Rhythm: Normal rate and regular rhythm.     Heart sounds: Normal heart sounds.  Pulmonary:     Effort: Pulmonary effort is normal.     Breath sounds: Normal breath sounds.  Musculoskeletal:     Comments: Right lateral distal thigh and right lateral proximal lower leg with some bruising, there is a hematoma over the lateral aspect of her right knee that is nontender, there is no excessive warmth, there is no overlying erythema  Skin:    General: Skin is warm and dry.  Neurological:     Mental Status: She is alert.     Assessment/Plan: Please see individual problem list.  Problem List Items Addressed This Visit     Paroxysmal atrial fibrillation (HCC) (Chronic)    Seem to be in sinus rhythm today.  She will continue Eliquis 5 mg twice daily, diltiazem 30 mg 3 times daily, and carvedilol 25 mg twice daily.      Acute hepatitis    Likely related to shock liver.  We will check liver enzymes today.      Bacteremia due to Streptococcus    She will continue her ceftriaxone per infectious disease.  She sees them next week.  She will discuss with them whether or not she needs to remain out of work due to her PICC line.      COVID-19 virus infection - Primary    Oxygenation  is adequate.  Lungs are clear.  Discussed that it may take some time for her symptoms to improve particularly with her chronic medical issues.  We will check her liver enzymes today to help determine what cough medications we can prescribe.  She will seek medical attention for cough productive of blood, worsening shortness of breath, fevers, or any significantly worsening symptoms.  I have provided the patient a work note to remain out of work for the next week.  If she is not feeling  improved by that time she will let me know.      Hematoma    Hematoma of right knee with surrounding bruising.  No signs of infection.  Discussed it would take months for this to resolve.  Advised to let us know if there is any erythema, excessive warmth, or increasing size.      Other Visit Diagnoses     LFTs abnormal       Relevant Orders   Comp Met (CMET)       Return in about 3 weeks (around 12/20/2021).  I have spent 37 minutes in the care of this patient regarding history taking, documentation, chart review, completion of exam, discussion of plan.   Audrey Rumps, MD [place]

## 2021-11-29 NOTE — H&P (Signed)
SUBJECTIVE: Audrey Wolf is a 71 y.o. female history of CAD Status post CABG as well as congestive heart failure recent diagnosis of COVID-19 presented to the ER on 11/16/21 complaining of worsening shortness of breath, near syncopal episodes, malaise and fatigue.  Was having some nausea as well as loose stool.    Patient found to have bacteremia due to Streptococcus viridans suspected source poor dentition. Patient in need of TEE to rule out vegetation.   PHYSICAL EXAM General: Well developed, well nourished, in no acute distress HEENT:  Normocephalic and atramatic Neck:  No JVD.  Lungs: Clear bilaterally to auscultation and percussion. Heart: HRRR . Normal S1 and S2 without gallops or murmurs.  Abdomen: Bowel sounds are positive, abdomen soft and non-tender  Msk:  Back normal, normal gait. Normal strength and tone for age. Extremities: No clubbing, cyanosis or edema.   Neuro: Alert and oriented X 3. Psych:  Good affect, responds appropriately    ASSESSMENT AND PLAN: Patient unable to have TEE during hospitalization due to being Covid positive. TEE scheduled for 12/02/21.     Jamespaul Secrist, FNP-C 11/29/2021 3:11 PM

## 2021-11-29 NOTE — Assessment & Plan Note (Signed)
She will continue her ceftriaxone per infectious disease.  She sees them next week.  She will discuss with them whether or not she needs to remain out of work due to her PICC line.

## 2021-11-29 NOTE — Assessment & Plan Note (Addendum)
Oxygenation is adequate.  Lungs are clear.  Discussed that it may take some time for her symptoms to improve particularly with her chronic medical issues.  We will check her liver enzymes today to help determine what cough medications we can prescribe.  She will seek medical attention for cough productive of blood, worsening shortness of breath, fevers, or any significantly worsening symptoms.  I have provided the patient a work note to remain out of work for the next week.  If she is not feeling improved by that time she will let me know.

## 2021-11-30 DIAGNOSIS — R7881 Bacteremia: Secondary | ICD-10-CM | POA: Diagnosis not present

## 2021-12-01 DIAGNOSIS — R7881 Bacteremia: Secondary | ICD-10-CM | POA: Diagnosis not present

## 2021-12-02 ENCOUNTER — Encounter: Admission: RE | Payer: Self-pay | Source: Home / Self Care

## 2021-12-02 ENCOUNTER — Encounter (INDEPENDENT_AMBULATORY_CARE_PROVIDER_SITE_OTHER): Payer: Self-pay

## 2021-12-02 ENCOUNTER — Ambulatory Visit
Admission: RE | Admit: 2021-12-02 | Payer: BC Managed Care – PPO | Source: Home / Self Care | Admitting: Cardiovascular Disease

## 2021-12-02 DIAGNOSIS — R7881 Bacteremia: Secondary | ICD-10-CM | POA: Diagnosis not present

## 2021-12-02 SURGERY — ECHOCARDIOGRAM, TRANSESOPHAGEAL
Anesthesia: Moderate Sedation

## 2021-12-03 ENCOUNTER — Ambulatory Visit: Payer: Self-pay | Admitting: Cardiology

## 2021-12-03 DIAGNOSIS — B179 Acute viral hepatitis, unspecified: Secondary | ICD-10-CM | POA: Diagnosis not present

## 2021-12-03 DIAGNOSIS — R7881 Bacteremia: Secondary | ICD-10-CM | POA: Diagnosis not present

## 2021-12-04 DIAGNOSIS — R7881 Bacteremia: Secondary | ICD-10-CM | POA: Diagnosis not present

## 2021-12-05 ENCOUNTER — Ambulatory Visit: Payer: BC Managed Care – PPO | Attending: Infectious Diseases | Admitting: Infectious Diseases

## 2021-12-05 ENCOUNTER — Encounter: Payer: Self-pay | Admitting: Infectious Diseases

## 2021-12-05 VITALS — BP 161/70 | HR 66 | Temp 97.2°F

## 2021-12-05 DIAGNOSIS — N189 Chronic kidney disease, unspecified: Secondary | ICD-10-CM | POA: Insufficient documentation

## 2021-12-05 DIAGNOSIS — I4891 Unspecified atrial fibrillation: Secondary | ICD-10-CM | POA: Insufficient documentation

## 2021-12-05 DIAGNOSIS — I2510 Atherosclerotic heart disease of native coronary artery without angina pectoris: Secondary | ICD-10-CM | POA: Diagnosis not present

## 2021-12-05 DIAGNOSIS — D649 Anemia, unspecified: Secondary | ICD-10-CM | POA: Insufficient documentation

## 2021-12-05 DIAGNOSIS — E785 Hyperlipidemia, unspecified: Secondary | ICD-10-CM | POA: Insufficient documentation

## 2021-12-05 DIAGNOSIS — I130 Hypertensive heart and chronic kidney disease with heart failure and stage 1 through stage 4 chronic kidney disease, or unspecified chronic kidney disease: Secondary | ICD-10-CM | POA: Insufficient documentation

## 2021-12-05 DIAGNOSIS — Z9049 Acquired absence of other specified parts of digestive tract: Secondary | ICD-10-CM | POA: Insufficient documentation

## 2021-12-05 DIAGNOSIS — B955 Unspecified streptococcus as the cause of diseases classified elsewhere: Secondary | ICD-10-CM

## 2021-12-05 DIAGNOSIS — Z8616 Personal history of COVID-19: Secondary | ICD-10-CM | POA: Diagnosis not present

## 2021-12-05 DIAGNOSIS — I509 Heart failure, unspecified: Secondary | ICD-10-CM | POA: Insufficient documentation

## 2021-12-05 DIAGNOSIS — Z951 Presence of aortocoronary bypass graft: Secondary | ICD-10-CM | POA: Insufficient documentation

## 2021-12-05 DIAGNOSIS — R7881 Bacteremia: Secondary | ICD-10-CM | POA: Insufficient documentation

## 2021-12-05 DIAGNOSIS — R7401 Elevation of levels of liver transaminase levels: Secondary | ICD-10-CM | POA: Insufficient documentation

## 2021-12-05 NOTE — Patient Instructions (Addendum)
You are here for follow for the recent bacterial infection in your blood- you will complete 6 weeks of Iv antibiotic on 12/29/21- As you still have the cough due to COVID- you couldn't get TEE. The PICC will be removed on 12/30/21 Will do a quick telephone visit on 11/28

## 2021-12-05 NOTE — Progress Notes (Signed)
NAME: Audrey Wolf  DOB: 09-21-1950  MRN: [id number]  Date/Time: 12/05/2021 11:36 AM   Subjective:  Full after recent hospitalization 11/17/2021 until 11/22/2021. Was diagnosed with Streptococcus viridans bacteremia and COVID respiratory infection.  In the hospital she also had abnormal LFTs secondary to shock liver.  She was discharged home on 6 weeks of IV ceftriaxone.  TEE appointment was made on 12/02/2021 as outpatient but she did not go because she was not feeling well and was having cough Here with a family member  Audrey Wolf is a 71 y.o. 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  She has poor dentition and endocarditis is a concern. Because of COVID she could not get TEE while in the hospital.  It was scheduled for 12/02/2021 but patient could not go because of weakness and cough. It looks like patient would like to complete  6 weeks of antibiotics rather than get a TEE  Past Medical History:  Diagnosis Date   Arthritis    Asthma    CHF (congestive heart failure) (HCC)    Chronic kidney disease    Coronary artery disease    GERD (gastroesophageal reflux disease)    Headache    Hypertension    Nephrolithiasis    NSTEMI (non-ST elevated myocardial infarction) (HCC)    Postprandial RUQ pain 05/25/2019    Past Surgical History:  Procedure Laterality Date   ABDOMINAL HYSTERECTOMY     APPENDECTOMY     BREAST BIOPSY Left 12/31/2010   neg   CORONARY ARTERY BYPASS GRAFT N/A 05/22/2017   Procedure: CORONARY ARTERY BYPASS GRAFTING (CABG) x 2 WITH ENDOSCOPIC HARVESTING OF RIGHT SAPHENOUS VEIN;  Surgeon: Delight Ovens, MD;  Location: [facility] OR;  Service: Open Heart Surgery;  Laterality: N/A;   LEFT HEART CATH AND CORONARY ANGIOGRAPHY N/A  05/18/2017   Procedure: LEFT HEART CATH AND CORONARY ANGIOGRAPHY;  Surgeon: Laurier Nancy, MD;  Location: [facility] INVASIVE CV LAB;  Service: Cardiovascular;  Laterality: N/A;   LITHOTRIPSY     TEE WITHOUT CARDIOVERSION N/A 05/22/2017   Procedure: TRANSESOPHAGEAL ECHOCARDIOGRAM (TEE);  Surgeon: Delight Ovens, MD;  Location: [facility];  Service: Open Heart Surgery;  Laterality: N/A;   TONSILLECTOMY      Social History   Socioeconomic History   Marital status: Single    Spouse name: Not on file   Number of children: Not on file   Years of education: Not on file   Highest education level: Not on file  Occupational History    Employer: [employer]  Tobacco Use   Smoking status: Never   Smokeless tobacco: Never  Vaping Use   Vaping Use: Never used  Substance and Sexual Activity   Alcohol use: No   Drug use: No   Sexual activity: Not Currently    Partners: Male    Birth control/protection: Post-menopausal  Other Topics Concern   Not on file  Social History Narrative   Not on file   Social Determinants of Health   Financial Resource Strain: Low Risk  (10/03/2021)   Overall Financial Resource Strain (CARDIA)    Difficulty of Paying Living Expenses: Not hard at all  Food Insecurity: No Food Insecurity (11/19/2021)   Hunger Vital Sign    Worried About Programme researcher, broadcasting/film/video in  the Last Year: Never true    Ran Out of Food in the Last Year: Never true  Transportation Needs: No Transportation Needs (11/19/2021)   PRAPARE - Administrator, Civil Service (Medical): No    Lack of Transportation (Non-Medical): No  Physical Activity: Sufficiently Active (10/03/2021)   Exercise Vital Sign    Days of Exercise per Week: 5 days    Minutes of Exercise per Session: 30 min  Stress: No Stress Concern Present (10/03/2021)   [name]    Feeling of Stress : Only a little  Social Connections: Unknown (10/03/2021)   Social  Connection and Isolation Panel [NHANES]    Frequency of Communication with Friends and Family: More than three times a week    Frequency of Social Gatherings with Friends and Family: More than three times a week    Attends Religious Services: Not on file    Active Member of Clubs or Organizations: Not on file    Attends Banker Meetings: Not on file    Marital Status: Not on file  Intimate Partner Violence: Not At Risk (11/19/2021)   Humiliation, Afraid, Rape, and Kick questionnaire    Fear of Current or Ex-Partner: No    Emotionally Abused: No    Physically Abused: No    Sexually Abused: No    Family History  Problem Relation Age of Onset   Arthritis Mother    Arthritis Father    Heart disease Father    Stroke Father    Sudden Cardiac Death Father    Stroke Son    Breast cancer Maternal Aunt    Allergies  Allergen Reactions   Lisinopril Cough   Ace Inhibitors Other (See Comments)    Has been told to avoid these and any diuretics because "left kidney has shrunk up and doesn't work"   I? Current Outpatient Medications  Medication Sig Dispense Refill   acetaminophen (TYLENOL) 500 MG tablet Take 1 tablet (500 mg total) by mouth daily as needed for mild pain or headache. 30 tablet 0   albuterol (VENTOLIN HFA) 108 (90 Base) MCG/ACT inhaler SMARTSIG:2 Puff(s) By Mouth Every 4-6 Hours PRN     budesonide-formoterol (SYMBICORT) 80-4.5 MCG/ACT inhaler Inhale 2 puffs into the lungs 2 (two) times daily. 1 each 3   carvedilol (COREG) 25 MG tablet Take 1 tablet (25 mg total) by mouth 2 (two) times daily with a meal. 180 tablet 0   cefTRIAXone (ROCEPHIN) IVPB Inject 2 g into the vein daily. Indication:  Strep viridians bacteremia  First dose: yes Last Day of Therapy: 12/29/2021 Labs weekly on Monday while on IV antibiotics: CBC with differential, CMP Fax weekly lab results promptly to [phone] Please pull PICC at completion of IV antibiotics Method of administration: IV  Push Method of administration may be changed at the discretion of home infusion pharmacist based upon assessment of the patient and/or caregiver's ability to self-administer the medication ordered. 38 Units 0   diltiazem (CARDIZEM) 30 MG tablet Take 1 tablet (30 mg total) by mouth 3 (three) times daily. 90 tablet 2   famotidine (PEPCID) 20 MG tablet Take 20 mg by mouth 2 (two) times daily.     Iron, Ferrous Sulfate, 325 (65 Fe) MG TABS Take 1 tablet by mouth daily with breakfast.     loratadine (CLARITIN) 10 MG tablet Take 10 mg by mouth daily.      losartan (COZAAR) 25 MG tablet Take  25 mg by mouth daily.     Multiple Vitamins-Minerals (PRESERVISION AREDS 2) CAPS Take 2 capsules by mouth daily.     rosuvastatin (CRESTOR) 20 MG tablet Take 1 tablet by mouth once daily 90 tablet 0   tolterodine (DETROL LA) 4 MG 24 hr capsule Take 1 capsule (4 mg total) by mouth daily. 30 capsule 11   apixaban (ELIQUIS) 5 MG TABS tablet Take 1 tablet (5 mg total) by mouth 2 (two) times daily. 60 tablet 2   No current facility-administered medications for this visit.     Abtx:  Anti-infectives (From admission, onward)    None       REVIEW OF SYSTEMS:  Const: negative fever, negative chills, some weight loss, poor taste Eyes: negative diplopia or visual changes, negative eye pain ENT: negative coryza, negative sore throat Resp: Cough and some shortness of breath.   cards: negative for chest pain, palpitations, lower extremity edema GU: negative for frequency, dysuria and hematuria GI: Negative for abdominal pain, diarrhea, bleeding, constipation Skin: negative for rash and pruritus Heme: negative for easy bruising and gum/nose bleeding MS: General weakness Neurolo:negative for headaches, dizziness, vertigo, memory problems  Psych:  anxiety,  Endocrine: negative for thyroid, diabetes Allergy/Immunology- as above: Objective:  VITALS:  BP (!) 161/70   Pulse 66   Temp (!) 97.2 F (36.2 C)  (Temporal)  LDA Right PICC line. PHYSICAL EXAM:  General: Alert, cooperative, weak and pale Head: Normocephalic, without obvious abnormality, atraumatic. Eyes: Conjunctivae clear, anicteric sclerae. Pupils are equal ENT did not examine Poor dentition  neck: Supple, symmetrical, no adenopathy, thyroid: non tender no carotid bruit and no JVD.  Lungs: Bilateral air entry.  Very few crypts in the bases   Heart: Regular rate and rhythm,  Abdomen: Soft, non-tender,not distended. Bowel sounds normal. No masses Extremities: atraumatic, no cyanosis. No edema. No clubbing Skin: No rashes or lesions. Or bruising Lymph: Cervical, supraclavicular normal. Neurologic: Grossly non-focal Pertinent Labs     Latest Ref Rng & Units 11/29/2021   11:39 AM 11/26/2021    2:46 PM 11/22/2021   11:28 AM  CMP  Glucose 70 - 99 mg/dL 100  121  108   BUN 6 - 23 mg/dL 16  18  16    Creatinine 0.40 - 1.20 mg/dL 1.14  1.41  1.04   Sodium 135 - 145 mEq/L 140  139  139   Potassium 3.5 - 5.1 mEq/L 3.4  2.9  3.5   Chloride 96 - 112 mEq/L 108  109  113   CO2 19 - 32 mEq/L 23  21  20    Calcium 8.4 - 10.5 mg/dL 8.5  8.5  8.2   Total Protein 6.0 - 8.3 g/dL 5.4  5.7    Total Bilirubin 0.2 - 1.2 mg/dL 0.6  0.8    Alkaline Phos 39 - 117 U/L 176  131    AST 0 - 37 U/L 74  81    ALT 0 - 35 U/L 119  175    ? Impression/Recommendation ? Streptococcus viridans bacteremia.  Likely source dentition Empirically being treated as endocarditis She missed her appointment for TEE because she was not feeling well She will  call them to reschedule if she  decides Continue ceftriaxone until 12/29/2021  Recent COVID respiratory illness Still has lingering cough and feels very tired  AKI resolved  Severely elevated transaminases much improved Last AST was 74 and ALT was 119.  It was in the thousands.  Anemia  Thrombocytopenia  and leukopenia resolved  CAD status post CABG  A-fib now well  controlled _________________________________________________ Discussed with patient, and her family member She has a phone visit on 12/31/2021 PICC line can be removed once she completes antibiotics on 12/29/2021 Note:  This document was prepared using Dragon voice recognition software and may include unintentional dictation errors.

## 2021-12-06 ENCOUNTER — Telehealth: Payer: Self-pay | Admitting: Family Medicine

## 2021-12-06 DIAGNOSIS — R7881 Bacteremia: Secondary | ICD-10-CM | POA: Diagnosis not present

## 2021-12-06 NOTE — Telephone Encounter (Signed)
I called the patient and informed her that her work note is available for pickup at the front desk and she can pick up and she understood.  Dannica Bickham,cma

## 2021-12-06 NOTE — Telephone Encounter (Signed)
Please let the patient know I created a new work note for her.  Her infectious disease doctor sent me a message stating she needed to be out at least until she finished the IV antibiotics on 12/29/2021.  Patient can pick up this work note at her convenience.

## 2021-12-07 DIAGNOSIS — R7881 Bacteremia: Secondary | ICD-10-CM | POA: Diagnosis not present

## 2021-12-08 DIAGNOSIS — R7881 Bacteremia: Secondary | ICD-10-CM | POA: Diagnosis not present

## 2021-12-09 DIAGNOSIS — R7881 Bacteremia: Secondary | ICD-10-CM | POA: Diagnosis not present

## 2021-12-10 DIAGNOSIS — Z452 Encounter for adjustment and management of vascular access device: Secondary | ICD-10-CM | POA: Diagnosis not present

## 2021-12-10 DIAGNOSIS — R7881 Bacteremia: Secondary | ICD-10-CM | POA: Diagnosis not present

## 2021-12-11 DIAGNOSIS — R7881 Bacteremia: Secondary | ICD-10-CM | POA: Diagnosis not present

## 2021-12-11 NOTE — Telephone Encounter (Signed)
Patient has a disability form to be filled out and signed by provider it is in the sign basket.  Nia,cma

## 2021-12-12 DIAGNOSIS — R7881 Bacteremia: Secondary | ICD-10-CM | POA: Diagnosis not present

## 2021-12-13 ENCOUNTER — Telehealth: Payer: Self-pay

## 2021-12-13 DIAGNOSIS — R7881 Bacteremia: Secondary | ICD-10-CM | POA: Diagnosis not present

## 2021-12-13 NOTE — Telephone Encounter (Signed)
Signed. This can be faxed with my last note, IDs last note, and the discharge summary from her recent hospitalization.

## 2021-12-13 NOTE — Telephone Encounter (Signed)
Patient has a disability form to be filled out and signed by provider it is in the sign basket.  Ronson Hagins,cma

## 2021-12-13 NOTE — Telephone Encounter (Signed)
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  Nia,cma

## 2021-12-13 NOTE — Telephone Encounter (Addendum)
Pt daughter n law called about disability form and being able to have them faxed to [place]

## 2021-12-13 NOTE — Telephone Encounter (Signed)
I am working on this for the patient. Can you find out what her job duties consist of? Thanks.

## 2021-12-14 DIAGNOSIS — R7881 Bacteremia: Secondary | ICD-10-CM | POA: Diagnosis not present

## 2021-12-15 DIAGNOSIS — R7881 Bacteremia: Secondary | ICD-10-CM | POA: Diagnosis not present

## 2021-12-16 ENCOUNTER — Encounter: Payer: Self-pay | Admitting: Family Medicine

## 2021-12-16 ENCOUNTER — Ambulatory Visit (INDEPENDENT_AMBULATORY_CARE_PROVIDER_SITE_OTHER): Payer: BC Managed Care – PPO | Admitting: Family Medicine

## 2021-12-16 ENCOUNTER — Ambulatory Visit: Payer: Self-pay | Admitting: Cardiovascular Disease

## 2021-12-16 VITALS — BP 140/60 | HR 65 | Temp 98.7°F | Ht 69.0 in | Wt 196.8 lb

## 2021-12-16 DIAGNOSIS — R7989 Other specified abnormal findings of blood chemistry: Secondary | ICD-10-CM | POA: Diagnosis not present

## 2021-12-16 DIAGNOSIS — U071 COVID-19: Secondary | ICD-10-CM

## 2021-12-16 DIAGNOSIS — B179 Acute viral hepatitis, unspecified: Secondary | ICD-10-CM | POA: Diagnosis not present

## 2021-12-16 DIAGNOSIS — R7881 Bacteremia: Secondary | ICD-10-CM | POA: Diagnosis not present

## 2021-12-16 DIAGNOSIS — B955 Unspecified streptococcus as the cause of diseases classified elsewhere: Secondary | ICD-10-CM

## 2021-12-16 DIAGNOSIS — I2510 Atherosclerotic heart disease of native coronary artery without angina pectoris: Secondary | ICD-10-CM

## 2021-12-16 NOTE — Assessment & Plan Note (Signed)
She will continue IV ceftriaxone through her PICC line.  She will continue to follow with infectious disease.

## 2021-12-16 NOTE — Progress Notes (Signed)
Audrey Alar, MD Phone: [phone]  Audrey Wolf is a 71 y.o. female who presents today for follow-up.  History of COVID/bacteremia: Patient notes she is progressively feeling better.  Her cough has improved.  Her shortness of breath is stable but she is still not able to do much physically.  No fevers.  She has a PICC line in place and notes the skin has gotten a little irritated underneath it.  She has not yet followed up with cardiology after discharge from the hospital.  Infectious disease recommended she be out of work while she has the PICC line in place.  This will be through 12/29/2021.  Social History   Tobacco Use  Smoking Status Never  Smokeless Tobacco Never    Current Outpatient Medications on File Prior to Visit  Medication Sig Dispense Refill   acetaminophen (TYLENOL) 500 MG tablet Take 1 tablet (500 mg total) by mouth daily as needed for mild pain or headache. 30 tablet 0   albuterol (VENTOLIN HFA) 108 (90 Base) MCG/ACT inhaler SMARTSIG:2 Puff(s) By Mouth Every 4-6 Hours PRN     budesonide-formoterol (SYMBICORT) 80-4.5 MCG/ACT inhaler Inhale 2 puffs into the lungs 2 (two) times daily. 1 each 3   carvedilol (COREG) 25 MG tablet Take 1 tablet (25 mg total) by mouth 2 (two) times daily with a meal. 180 tablet 0   cefTRIAXone (ROCEPHIN) IVPB Inject 2 g into the vein daily. Indication:  Strep viridians bacteremia  First dose: yes Last Day of Therapy: 12/29/2021 Labs weekly on Monday while on IV antibiotics: CBC with differential, CMP Fax weekly lab results promptly to [phone] Please pull PICC at completion of IV antibiotics Method of administration: IV Push Method of administration may be changed at the discretion of home infusion pharmacist based upon assessment of the patient and/or caregiver's ability to self-administer the medication ordered. 38 Units 0   diltiazem (CARDIZEM) 30 MG tablet Take 1 tablet (30 mg total) by mouth 3 (three) times daily. 90  tablet 2   famotidine (PEPCID) 20 MG tablet Take 20 mg by mouth 2 (two) times daily.     Iron, Ferrous Sulfate, 325 (65 Fe) MG TABS Take 1 tablet by mouth daily with breakfast.     loratadine (CLARITIN) 10 MG tablet Take 10 mg by mouth daily.      losartan (COZAAR) 25 MG tablet Take 25 mg by mouth daily.     Multiple Vitamins-Minerals (PRESERVISION AREDS 2) CAPS Take 2 capsules by mouth daily.     rosuvastatin (CRESTOR) 20 MG tablet Take 1 tablet by mouth once daily 90 tablet 0   tolterodine (DETROL LA) 4 MG 24 hr capsule Take 1 capsule (4 mg total) by mouth daily. 30 capsule 11   apixaban (ELIQUIS) 5 MG TABS tablet Take 1 tablet (5 mg total) by mouth 2 (two) times daily. 60 tablet 2   No current facility-administered medications on file prior to visit.     ROS see history of present illness  Objective  Physical Exam Vitals:   12/16/21 1637  BP: (!) 140/60  Pulse: 65  Temp: 98.7 F (37.1 C)  SpO2: 99%    BP Readings from Last 3 Encounters:  12/16/21 (!) 140/60  12/05/21 (!) 161/70  11/29/21 130/70   Wt Readings from Last 3 Encounters:  12/16/21 196 lb 12.8 oz (89.3 kg)  11/29/21 197 lb 9.6 oz (89.6 kg)  11/18/21 200 lb (90.7 kg)    Physical Exam Constitutional:  General: She is not in acute distress.    Appearance: She is not diaphoretic.  Cardiovascular:     Rate and Rhythm: Normal rate and regular rhythm.     Heart sounds: Normal heart sounds.  Pulmonary:     Effort: Pulmonary effort is normal.     Breath sounds: Normal breath sounds.  Skin:    General: Skin is warm and dry.  Neurological:     Mental Status: She is alert.      Assessment/Plan: Please see individual problem list.  Problem List Items Addressed This Visit     Bacteremia due to Streptococcus    She will continue IV ceftriaxone through her PICC line.  She will continue to follow with infectious disease.      CAD (coronary artery disease), native coronary artery    Patient was  encouraged to follow-up with her cardiologist.      COVID-19 virus infection    Patient has generally recovered.  She is back to her baseline with her breathing.      Other Visit Diagnoses     LFTs abnormal    -  Primary   Relevant Orders   Hepatic function panel       Return in about 4 weeks (around 01/13/2022).   Tommi Rumps, MD [place]

## 2021-12-16 NOTE — Telephone Encounter (Signed)
Disability form faxed to [place], confirmation given. Anniebell Bedore,cma

## 2021-12-16 NOTE — Assessment & Plan Note (Signed)
Patient has generally recovered.  She is back to her baseline with her breathing.

## 2021-12-16 NOTE — Assessment & Plan Note (Signed)
Patient was encouraged to follow-up with her cardiologist.

## 2021-12-17 ENCOUNTER — Other Ambulatory Visit (INDEPENDENT_AMBULATORY_CARE_PROVIDER_SITE_OTHER): Payer: BC Managed Care – PPO

## 2021-12-17 DIAGNOSIS — R7989 Other specified abnormal findings of blood chemistry: Secondary | ICD-10-CM

## 2021-12-17 DIAGNOSIS — R7881 Bacteremia: Secondary | ICD-10-CM | POA: Diagnosis not present

## 2021-12-17 DIAGNOSIS — E611 Iron deficiency: Secondary | ICD-10-CM | POA: Diagnosis not present

## 2021-12-17 LAB — CBC
HCT: 30.3 % — ABNORMAL LOW (ref 36.0–46.0)
Hemoglobin: 9.7 g/dL — ABNORMAL LOW (ref 12.0–15.0)
MCHC: 32 g/dL (ref 30.0–36.0)
MCV: 80.4 fl (ref 78.0–100.0)
Platelets: 178 10*3/uL (ref 150.0–400.0)
RBC: 3.77 Mil/uL — ABNORMAL LOW (ref 3.87–5.11)
RDW: 17.7 % — ABNORMAL HIGH (ref 11.5–15.5)
WBC: 5.2 10*3/uL (ref 4.0–10.5)

## 2021-12-17 LAB — IBC + FERRITIN
Ferritin: 41.4 ng/mL (ref 10.0–291.0)
Iron: 29 ug/dL — ABNORMAL LOW (ref 42–145)
Saturation Ratios: 7.9 % — ABNORMAL LOW (ref 20.0–50.0)
TIBC: 368.2 ug/dL (ref 250.0–450.0)
Transferrin: 263 mg/dL (ref 212.0–360.0)

## 2021-12-17 LAB — HEPATIC FUNCTION PANEL
ALT: 17 U/L (ref 0–35)
AST: 25 U/L (ref 0–37)
Albumin: 3.6 g/dL (ref 3.5–5.2)
Alkaline Phosphatase: 107 U/L (ref 39–117)
Bilirubin, Direct: 0.1 mg/dL (ref 0.0–0.3)
Total Bilirubin: 0.4 mg/dL (ref 0.2–1.2)
Total Protein: 5.9 g/dL — ABNORMAL LOW (ref 6.0–8.3)

## 2021-12-18 DIAGNOSIS — R7881 Bacteremia: Secondary | ICD-10-CM | POA: Diagnosis not present

## 2021-12-19 DIAGNOSIS — R7881 Bacteremia: Secondary | ICD-10-CM | POA: Diagnosis not present

## 2021-12-20 DIAGNOSIS — R7881 Bacteremia: Secondary | ICD-10-CM | POA: Diagnosis not present

## 2021-12-21 DIAGNOSIS — R7881 Bacteremia: Secondary | ICD-10-CM | POA: Diagnosis not present

## 2021-12-22 DIAGNOSIS — R7881 Bacteremia: Secondary | ICD-10-CM | POA: Diagnosis not present

## 2021-12-23 ENCOUNTER — Telehealth: Payer: Self-pay | Admitting: Family Medicine

## 2021-12-23 DIAGNOSIS — R7881 Bacteremia: Secondary | ICD-10-CM | POA: Diagnosis not present

## 2021-12-23 DIAGNOSIS — B954 Other streptococcus as the cause of diseases classified elsewhere: Secondary | ICD-10-CM | POA: Diagnosis not present

## 2021-12-23 NOTE — Telephone Encounter (Signed)
Patient's daughter in-law called, patient is due to go back to work on 12/30/2021, that is the day her PICC line is coming out. Short term disability is demanding a time back and patient is not ready. She would like to know how much time she should tell them. Please call her. Patient's daughter in-law stated that patient does a lot of lifting.

## 2021-12-24 DIAGNOSIS — J4520 Mild intermittent asthma, uncomplicated: Secondary | ICD-10-CM

## 2021-12-24 DIAGNOSIS — N179 Acute kidney failure, unspecified: Secondary | ICD-10-CM

## 2021-12-24 DIAGNOSIS — I130 Hypertensive heart and chronic kidney disease with heart failure and stage 1 through stage 4 chronic kidney disease, or unspecified chronic kidney disease: Secondary | ICD-10-CM

## 2021-12-24 DIAGNOSIS — Z5181 Encounter for therapeutic drug level monitoring: Secondary | ICD-10-CM

## 2021-12-24 DIAGNOSIS — K7200 Acute and subacute hepatic failure without coma: Secondary | ICD-10-CM | POA: Diagnosis not present

## 2021-12-24 DIAGNOSIS — I2510 Atherosclerotic heart disease of native coronary artery without angina pectoris: Secondary | ICD-10-CM

## 2021-12-24 DIAGNOSIS — Z792 Long term (current) use of antibiotics: Secondary | ICD-10-CM

## 2021-12-24 DIAGNOSIS — M1990 Unspecified osteoarthritis, unspecified site: Secondary | ICD-10-CM

## 2021-12-24 DIAGNOSIS — I480 Paroxysmal atrial fibrillation: Secondary | ICD-10-CM

## 2021-12-24 DIAGNOSIS — R7303 Prediabetes: Secondary | ICD-10-CM

## 2021-12-24 DIAGNOSIS — N1832 Chronic kidney disease, stage 3b: Secondary | ICD-10-CM

## 2021-12-24 DIAGNOSIS — Z7901 Long term (current) use of anticoagulants: Secondary | ICD-10-CM

## 2021-12-24 DIAGNOSIS — Z452 Encounter for adjustment and management of vascular access device: Secondary | ICD-10-CM | POA: Diagnosis not present

## 2021-12-24 DIAGNOSIS — E079 Disorder of thyroid, unspecified: Secondary | ICD-10-CM

## 2021-12-24 DIAGNOSIS — U071 COVID-19: Secondary | ICD-10-CM

## 2021-12-24 DIAGNOSIS — R7881 Bacteremia: Secondary | ICD-10-CM | POA: Diagnosis not present

## 2021-12-24 DIAGNOSIS — D61818 Other pancytopenia: Secondary | ICD-10-CM

## 2021-12-24 DIAGNOSIS — B954 Other streptococcus as the cause of diseases classified elsewhere: Secondary | ICD-10-CM | POA: Diagnosis not present

## 2021-12-24 DIAGNOSIS — Z7952 Long term (current) use of systemic steroids: Secondary | ICD-10-CM

## 2021-12-24 DIAGNOSIS — E785 Hyperlipidemia, unspecified: Secondary | ICD-10-CM

## 2021-12-24 DIAGNOSIS — Z951 Presence of aortocoronary bypass graft: Secondary | ICD-10-CM

## 2021-12-24 DIAGNOSIS — G4710 Hypersomnia, unspecified: Secondary | ICD-10-CM

## 2021-12-24 DIAGNOSIS — I5032 Chronic diastolic (congestive) heart failure: Secondary | ICD-10-CM

## 2021-12-24 DIAGNOSIS — D631 Anemia in chronic kidney disease: Secondary | ICD-10-CM

## 2021-12-24 DIAGNOSIS — Z9181 History of falling: Secondary | ICD-10-CM

## 2021-12-24 DIAGNOSIS — N3281 Overactive bladder: Secondary | ICD-10-CM

## 2021-12-24 DIAGNOSIS — I252 Old myocardial infarction: Secondary | ICD-10-CM

## 2021-12-24 DIAGNOSIS — N132 Hydronephrosis with renal and ureteral calculous obstruction: Secondary | ICD-10-CM

## 2021-12-24 DIAGNOSIS — K219 Gastro-esophageal reflux disease without esophagitis: Secondary | ICD-10-CM

## 2021-12-24 NOTE — Telephone Encounter (Signed)
I called patient and she is scheduled for 12/30/2021 per the provider.  ng

## 2021-12-24 NOTE — Telephone Encounter (Signed)
Noted. If she feels as though she is not ready to go back to work I will need to see her in the office to reevaluate. I can see her on 11/27 at 12:30 pm. They should advise her job that I am seeing her on 11/27 to determine if she needs to be out longer and can fill out additional paperwork if it is determined that she will need to be out longer.

## 2021-12-25 DIAGNOSIS — R7881 Bacteremia: Secondary | ICD-10-CM | POA: Diagnosis not present

## 2021-12-26 DIAGNOSIS — R7881 Bacteremia: Secondary | ICD-10-CM | POA: Diagnosis not present

## 2021-12-27 ENCOUNTER — Encounter (HOSPITAL_COMMUNITY): Payer: Self-pay | Admitting: Anesthesiology

## 2021-12-27 DIAGNOSIS — R7881 Bacteremia: Secondary | ICD-10-CM | POA: Diagnosis not present

## 2021-12-28 ENCOUNTER — Other Ambulatory Visit: Payer: Self-pay | Admitting: Family Medicine

## 2021-12-28 DIAGNOSIS — I151 Hypertension secondary to other renal disorders: Secondary | ICD-10-CM

## 2021-12-28 DIAGNOSIS — R7881 Bacteremia: Secondary | ICD-10-CM | POA: Diagnosis not present

## 2021-12-29 DIAGNOSIS — R7881 Bacteremia: Secondary | ICD-10-CM | POA: Diagnosis not present

## 2021-12-29 MED ORDER — SODIUM CHLORIDE 0.9 % IV SOLN: INTRAVENOUS | Status: DC

## 2021-12-30 ENCOUNTER — Ambulatory Visit (INDEPENDENT_AMBULATORY_CARE_PROVIDER_SITE_OTHER): Payer: BC Managed Care – PPO | Admitting: Family Medicine

## 2021-12-30 ENCOUNTER — Ambulatory Visit
Admission: RE | Admit: 2021-12-30 | Payer: BC Managed Care – PPO | Source: Ambulatory Visit | Admitting: Cardiovascular Disease

## 2021-12-30 ENCOUNTER — Encounter: Admission: RE | Payer: Self-pay | Source: Ambulatory Visit

## 2021-12-30 ENCOUNTER — Encounter: Payer: Self-pay | Admitting: Family Medicine

## 2021-12-30 VITALS — BP 150/70 | HR 59 | Temp 97.8°F | Ht 69.0 in | Wt 198.6 lb

## 2021-12-30 DIAGNOSIS — R0600 Dyspnea, unspecified: Secondary | ICD-10-CM

## 2021-12-30 DIAGNOSIS — R42 Dizziness and giddiness: Secondary | ICD-10-CM | POA: Diagnosis not present

## 2021-12-30 DIAGNOSIS — R7881 Bacteremia: Secondary | ICD-10-CM | POA: Diagnosis not present

## 2021-12-30 DIAGNOSIS — J309 Allergic rhinitis, unspecified: Secondary | ICD-10-CM | POA: Diagnosis not present

## 2021-12-30 DIAGNOSIS — I2510 Atherosclerotic heart disease of native coronary artery without angina pectoris: Secondary | ICD-10-CM

## 2021-12-30 DIAGNOSIS — B955 Unspecified streptococcus as the cause of diseases classified elsewhere: Secondary | ICD-10-CM | POA: Diagnosis not present

## 2021-12-30 LAB — COMPREHENSIVE METABOLIC PANEL
ALT: 23 U/L (ref 0–35)
AST: 29 U/L (ref 0–37)
Albumin: 3.8 g/dL (ref 3.5–5.2)
Alkaline Phosphatase: 85 U/L (ref 39–117)
BUN: 23 mg/dL (ref 6–23)
CO2: 24 mEq/L (ref 19–32)
Calcium: 8.7 mg/dL (ref 8.4–10.5)
Chloride: 105 mEq/L (ref 96–112)
Creatinine, Ser: 1 mg/dL (ref 0.40–1.20)
GFR: 56.48 mL/min — ABNORMAL LOW (ref >60.00)
Glucose, Bld: 104 mg/dL — ABNORMAL HIGH (ref 70–99)
Potassium: 4.3 mEq/L (ref 3.5–5.1)
Sodium: 136 mEq/L (ref 135–145)
Total Bilirubin: 0.3 mg/dL (ref 0.2–1.2)
Total Protein: 5.9 g/dL — ABNORMAL LOW (ref 6.0–8.3)

## 2021-12-30 LAB — CBC
HCT: 31.2 % — ABNORMAL LOW (ref 36.0–46.0)
Hemoglobin: 10 g/dL — ABNORMAL LOW (ref 12.0–15.0)
MCHC: 32 g/dL (ref 30.0–36.0)
MCV: 81.8 fl (ref 78.0–100.0)
Platelets: 220 10*3/uL (ref 150.0–400.0)
RBC: 3.81 Mil/uL — ABNORMAL LOW (ref 3.87–5.11)
RDW: 18.6 % — ABNORMAL HIGH (ref 11.5–15.5)
WBC: 5.1 10*3/uL (ref 4.0–10.5)

## 2021-12-30 SURGERY — ECHOCARDIOGRAM, TRANSESOPHAGEAL
Anesthesia: Moderate Sedation

## 2021-12-30 MED ORDER — AZELASTINE HCL 0.1 % NA SOLN: 2.0000 | Freq: Two times a day (BID) | NASAL | 12 refills | Status: DC

## 2021-12-30 NOTE — Patient Instructions (Signed)
Nice to see you. ENT should contact you to schedule an appointment for your dizziness.  If you do not hear from them in the next week or 2 please let us know. Pulmonology should also contact you to set up an appointment for your breathing issues. Please try the Astelin nasal spray to see if that helps open your left ear up.

## 2021-12-30 NOTE — Assessment & Plan Note (Signed)
This is a chronic ongoing issue.  She is back to her baseline though it seems as though this made it difficult for her to work previously.  Discussed she would need to see her cardiologist and be referred to a pulmonologist for further evaluation of this issue to fully determine if she meets criteria for more long-term disability.  Referral placed to pulmonology.  She will follow-up with her cardiologist.  I will provide her with a work note to be out until she sees her specialist.

## 2021-12-30 NOTE — Assessment & Plan Note (Signed)
Patient has finished IV ceftriaxone.  PICC line has been removed.  She will follow-up with infectious disease tomorrow.

## 2021-12-30 NOTE — Assessment & Plan Note (Addendum)
Patient describes vertiginous symptoms though her orthostatics are positive.  This could be related to eustachian tube dysfunction given her history of allergies and her left ear fullness.  Could be related to an inner ear issue or orthostatic hypotension.  She has a benign ear exam.  We will treat her allergies with Astelin 2 sprays each nostril twice daily.  I will refer to ENT for further evaluation.  We will check labs today to ensure that she is not more anemic or dehydrated that could be contributing to her orthostasis.  Certainly she should not drive until her dizziness is better managed.

## 2021-12-30 NOTE — Progress Notes (Signed)
Audrey Rumps, MD Phone: [phone]  Audrey Wolf is a 71 y.o. female who presents today for follow-up.  Bacteremia: Patient's PICC line was removed today.  She notes no fevers.  She feels better than she did at her last visit.  Dyspnea: This is a chronic issue.  She notes she is back to her baseline from before she had COVID.  She is still not able to do much physically without getting short of breath.  She does have a cardiologist.  She does not have a pulmonologist.  Patient is not sure how she will do her job with her dyspnea and having to stand and move around for 8 hours at a time.  Dizziness: Patient notes she has had dizziness since she was in the hospital previously.  She notes her left ear feels somewhat stopped up.  She describes a room spinning sensation.  She notes this often occurs if she walks too much.  It does not happen when she rolls over in bed.  She does have a history of allergies and takes an allergy pill though notes when she tried Flonase previously made her head hurt.  She notes the dizziness occurs on a daily basis.  Social History   Tobacco Use  Smoking Status Never  Smokeless Tobacco Never    Current Outpatient Medications on File Prior to Visit  Medication Sig Dispense Refill   acetaminophen (TYLENOL) 500 MG tablet Take 1 tablet (500 mg total) by mouth daily as needed for mild pain or headache. 30 tablet 0   albuterol (VENTOLIN HFA) 108 (90 Base) MCG/ACT inhaler SMARTSIG:2 Puff(s) By Mouth Every 4-6 Hours PRN     budesonide-formoterol (SYMBICORT) 80-4.5 MCG/ACT inhaler Inhale 2 puffs into the lungs 2 (two) times daily. 1 each 3   carvedilol (COREG) 25 MG tablet TAKE 1 TABLET BY MOUTH TWICE DAILY WITH MEALS 180 tablet 3   diltiazem (CARDIZEM) 30 MG tablet Take 1 tablet (30 mg total) by mouth 3 (three) times daily. 90 tablet 2   famotidine (PEPCID) 20 MG tablet Take 20 mg by mouth 2 (two) times daily.     Iron, Ferrous Sulfate, 325 (65 Fe) MG TABS  Take 1 tablet by mouth daily with breakfast.     loratadine (CLARITIN) 10 MG tablet Take 10 mg by mouth daily.      losartan (COZAAR) 25 MG tablet Take 25 mg by mouth daily.     Multiple Vitamins-Minerals (PRESERVISION AREDS 2) CAPS Take 2 capsules by mouth daily.     rosuvastatin (CRESTOR) 20 MG tablet Take 1 tablet by mouth once daily 90 tablet 0   tolterodine (DETROL LA) 4 MG 24 hr capsule Take 1 capsule (4 mg total) by mouth daily. 30 capsule 11   apixaban (ELIQUIS) 5 MG TABS tablet Take 1 tablet (5 mg total) by mouth 2 (two) times daily. 60 tablet 2   Current Facility-Administered Medications on File Prior to Visit  Medication Dose Route Frequency Provider Last Rate Last Admin   0.9 %  sodium chloride infusion   Intravenous Continuous Scoggins, Amber, NP         ROS see history of present illness  Objective  Physical Exam Vitals:   12/30/21 1238  BP: (!) 150/70  Pulse: (!) 59  Temp: 97.8 F (36.6 C)  SpO2: 98%   Lying blood pressure 188/77 pulse 56 Sitting blood pressure 159/95 pulse 56 Standing blood pressure 137/70 pulse 59  BP Readings from Last 3 Encounters:  12/30/21 (!) 150/70  12/16/21 (!) 140/60  12/05/21 (!) 161/70   Wt Readings from Last 3 Encounters:  12/30/21 198 lb 9.6 oz (90.1 kg)  12/16/21 196 lb 12.8 oz (89.3 kg)  11/29/21 197 lb 9.6 oz (89.6 kg)    Physical Exam Constitutional:      General: She is not in acute distress.    Appearance: She is not diaphoretic.  Cardiovascular:     Rate and Rhythm: Normal rate and regular rhythm.     Heart sounds: Normal heart sounds.  Pulmonary:     Effort: Pulmonary effort is normal.     Breath sounds: Normal breath sounds.  Skin:    General: Skin is warm and dry.  Neurological:     Mental Status: She is alert.      Assessment/Plan: Please see individual problem list.  Problem List Items Addressed This Visit     Dyspnea (Chronic)    This is a chronic ongoing issue.  She is back to her baseline  though it seems as though this made it difficult for her to work previously.  Discussed she would need to see her cardiologist and be referred to a pulmonologist for further evaluation of this issue to fully determine if she meets criteria for more long-term disability.  Referral placed to pulmonology.  She will follow-up with her cardiologist.  I will provide her with a work note to be out until she sees her specialist.      Relevant Orders   Ambulatory referral to Pulmonology   Allergic rhinitis   Relevant Medications   azelastine (ASTELIN) 0.1 % nasal spray   Bacteremia due to Streptococcus - Primary    Patient has finished IV ceftriaxone.  PICC line has been removed.  She will follow-up with infectious disease tomorrow.      Dizziness    Patient describes vertiginous symptoms though her orthostatics are positive.  This could be related to eustachian tube dysfunction given her history of allergies and her left ear fullness.  Could be related to an inner ear issue or orthostatic hypotension.  She has a benign ear exam.  We will treat her allergies with Astelin 2 sprays each nostril twice daily.  I will refer to ENT for further evaluation.  We will check labs today to ensure that she is not more anemic or dehydrated that could be contributing to her orthostasis.  Certainly she should not drive until her dizziness is better managed.      Relevant Orders   Ambulatory referral to ENT   CBC   Comp Met (CMET)     Return for as scheduled.  I have spent 34 minutes in the care of this patient regarding history taking, documentation, completion of exam, discussion of plan, placing orders.   Audrey Rumps, MD [place]

## 2021-12-31 ENCOUNTER — Ambulatory Visit: Payer: BC Managed Care – PPO | Attending: Infectious Diseases | Admitting: Infectious Diseases

## 2021-12-31 DIAGNOSIS — Z1152 Encounter for screening for COVID-19: Secondary | ICD-10-CM | POA: Diagnosis not present

## 2021-12-31 DIAGNOSIS — B955 Unspecified streptococcus as the cause of diseases classified elsewhere: Secondary | ICD-10-CM | POA: Diagnosis not present

## 2021-12-31 DIAGNOSIS — R7881 Bacteremia: Secondary | ICD-10-CM

## 2021-12-31 NOTE — Progress Notes (Signed)
The purpose of this virtual visit is to provide medical care while limiting exposure to the novel coronavirus (COVID19) for both patient and office staff.   Consent was obtained for phone visit:  Yes.   Answered questions that patient had about telehealth interaction:  Yes.   I discussed the limitations, risks, security and privacy concerns of performing an evaluation and management service by telephone. I also discussed with the patient that there may be a patient responsible charge related to this service. The patient expressed understanding and agreed to proceed.   Patient Location: Home Provider Location: office People on the call patient and provider Follow up visit for strep viridans bacteremia Pt never got TEE She completed 6 weeks of IV ceftriaxone yesterday and the PICC line has been  removed She is feeling better Her only issue is the left ear feels blocked- saw her PCP yesterday and he has referred her to ENT Follow with me PRN Total time spent on this call 9 min

## 2022-01-06 ENCOUNTER — Telehealth: Payer: Self-pay

## 2022-01-06 NOTE — Telephone Encounter (Signed)
Audrey Wolf is calling from [facility] to ask Dr. [name] if she can discharge early.  Audrey Wolf states she was seeing patient for a pic line, which has been removed.  Audrey Wolf states she is not doing anything for the patient medically.

## 2022-01-07 NOTE — Telephone Encounter (Signed)
It is fine with me if they discharge her from home health.

## 2022-01-08 NOTE — Telephone Encounter (Signed)
Called and informed Alcario Drought from Home health that it was okay with you if they discharged pt.

## 2022-01-10 ENCOUNTER — Other Ambulatory Visit
Admission: RE | Admit: 2022-01-10 | Discharge: 2022-01-10 | Disposition: A | Discharge status: Home / Self Care | Payer: BC Managed Care – PPO | Source: Ambulatory Visit | Attending: Student in an Organized Health Care Education/Training Program | Admitting: Student in an Organized Health Care Education/Training Program

## 2022-01-10 ENCOUNTER — Ambulatory Visit
Admission: RE | Admit: 2022-01-10 | Discharge: 2022-01-10 | Disposition: A | Discharge status: Home / Self Care | Payer: BC Managed Care – PPO | Source: Ambulatory Visit | Attending: Student in an Organized Health Care Education/Training Program | Admitting: Student in an Organized Health Care Education/Training Program

## 2022-01-10 ENCOUNTER — Ambulatory Visit (INDEPENDENT_AMBULATORY_CARE_PROVIDER_SITE_OTHER): Payer: BC Managed Care – PPO | Admitting: Student in an Organized Health Care Education/Training Program

## 2022-01-10 ENCOUNTER — Encounter: Payer: Self-pay | Admitting: Student in an Organized Health Care Education/Training Program

## 2022-01-10 VITALS — BP 140/60 | HR 72 | Temp 98.1°F | Ht 69.0 in | Wt 195.4 lb

## 2022-01-10 DIAGNOSIS — R0602 Shortness of breath: Secondary | ICD-10-CM

## 2022-01-10 DIAGNOSIS — I5032 Chronic diastolic (congestive) heart failure: Secondary | ICD-10-CM | POA: Diagnosis not present

## 2022-01-10 DIAGNOSIS — J4520 Mild intermittent asthma, uncomplicated: Secondary | ICD-10-CM | POA: Diagnosis not present

## 2022-01-10 DIAGNOSIS — J449 Chronic obstructive pulmonary disease, unspecified: Secondary | ICD-10-CM | POA: Diagnosis not present

## 2022-01-10 DIAGNOSIS — R0600 Dyspnea, unspecified: Secondary | ICD-10-CM | POA: Diagnosis not present

## 2022-01-10 LAB — BASIC METABOLIC PANEL
Anion gap: 6 (ref 5–15)
BUN: 28 mg/dL — ABNORMAL HIGH (ref 8–23)
CO2: 22 mmol/L (ref 22–32)
Calcium: 9.2 mg/dL (ref 8.9–10.3)
Chloride: 113 mmol/L — ABNORMAL HIGH (ref 98–111)
Creatinine, Ser: 1.04 mg/dL — ABNORMAL HIGH (ref 0.44–1.00)
GFR, Estimated: 57 mL/min — ABNORMAL LOW (ref >60)
Glucose, Bld: 113 mg/dL — ABNORMAL HIGH (ref 70–99)
Potassium: 4.2 mmol/L (ref 3.5–5.1)
Sodium: 141 mmol/L (ref 135–145)

## 2022-01-10 LAB — CBC WITH DIFFERENTIAL/PLATELET
Abs Immature Granulocytes: 0.01 10*3/uL (ref 0.00–0.07)
Basophils Absolute: 0 10*3/uL (ref 0.0–0.1)
Basophils Relative: 1 %
Eosinophils Absolute: 0.1 10*3/uL (ref 0.0–0.5)
Eosinophils Relative: 1 %
HCT: 32.1 % — ABNORMAL LOW (ref 36.0–46.0)
Hemoglobin: 10 g/dL — ABNORMAL LOW (ref 12.0–15.0)
Immature Granulocytes: 0 %
Lymphocytes Relative: 25 %
Lymphs Abs: 1.3 10*3/uL (ref 0.7–4.0)
MCH: 26.1 pg (ref 26.0–34.0)
MCHC: 31.2 g/dL (ref 30.0–36.0)
MCV: 83.8 fL (ref 80.0–100.0)
Monocytes Absolute: 0.5 10*3/uL (ref 0.1–1.0)
Monocytes Relative: 10 %
Neutro Abs: 3.4 10*3/uL (ref 1.7–7.7)
Neutrophils Relative %: 63 %
Platelets: 220 10*3/uL (ref 150–400)
RBC: 3.83 MIL/uL — ABNORMAL LOW (ref 3.87–5.11)
RDW: 17.4 % — ABNORMAL HIGH (ref 11.5–15.5)
WBC: 5.3 10*3/uL (ref 4.0–10.5)
nRBC: 0 % (ref 0.0–0.2)

## 2022-01-10 LAB — BRAIN NATRIURETIC PEPTIDE: B Natriuretic Peptide: 159.3 pg/mL — ABNORMAL HIGH (ref 0.0–100.0)

## 2022-01-10 NOTE — Progress Notes (Signed)
Synopsis: Referred in for shortness of breath by Leone Haven, MD  Assessment & Plan:   1. Shortness of breath 2. Chronic diastolic CHF (congestive heart failure) ([place]) 3. Mild intermittent asthma, unspecified whether complicated  She is presenting today for the evaluation of shortness of breath that is acute on chronic, worsening after her recent admission to the hospital.  She does have a history of heart failure with preserved ejection fraction.  On reviewing her imaging from 2019, there is a CT scan that is showing significant interlobular septal thickening, groundglass and, and bilateral pleural effusions highly consistent with decompensated heart failure.  On exam today, she has bilateral bibasilar crackles with otherwise clear lungs and no wheeze.  It is not my impression that she is having worsening or uncontrolled asthma contributing to her symptoms but rather heart failure with preserved ejection fraction, especially that the patient is not on any diuretics.  For workup, I will obtain pulmonary function testing to evaluate spirometry, lung volumes, and DLCO.  This would be to work her up for obstructive and/or reactive airway disease such as asthma.  I have counseled her on the optimal use of her inhalers and recommended using the Symbicort more frequently as a rescue inhaler in place of the albuterol.  I will also obtain a chest x-ray today and blood work to include a BNP, BMP, and CBC.    Given that the patient tells me she has a solitary kidney and she is concerned about her kidney function, I have recommended that she reach out to her nephrologist and cardiologist so that she could be started on a low-dose diuretic.  I have also counseled her extensively on the importance of avoiding high salt foods.  - DG Chest 2 View; Future - Basic metabolic panel - B Nat Peptide - CBC w/Diff - Pulmonary Function Test [facility] Only; Future  Return in about 3 months (around 04/11/2022).  I  spent 60 minutes caring for this patient today, including preparing to see the patient, obtaining a medical history , reviewing a separately obtained history, performing a medically appropriate examination and/or evaluation, counseling and educating the patient/family/caregiver, ordering medications, tests, or procedures, documenting clinical information in the electronic health record, and independently interpreting results (not separately reported/billed) and communicating results to the patient/family/caregiver  Armando Reichert, MD [facility] 01/10/2022 9:46 AM    End of visit medications:  No orders of the defined types were placed in this encounter.    Current Outpatient Medications:    acetaminophen (TYLENOL) 500 MG tablet, Take 1 tablet (500 mg total) by mouth daily as needed for mild pain or headache., Disp: 30 tablet, Rfl: 0   albuterol (VENTOLIN HFA) 108 (90 Base) MCG/ACT inhaler, SMARTSIG:2 Puff(s) By Mouth Every 4-6 Hours PRN, Disp: , Rfl:    azelastine (ASTELIN) 0.1 % nasal spray, Place 2 sprays into both nostrils 2 (two) times daily. Use in each nostril as directed, Disp: 30 mL, Rfl: 12   budesonide-formoterol (SYMBICORT) 80-4.5 MCG/ACT inhaler, Inhale 2 puffs into the lungs 2 (two) times daily., Disp: 1 each, Rfl: 3   carvedilol (COREG) 25 MG tablet, TAKE 1 TABLET BY MOUTH TWICE DAILY WITH MEALS, Disp: 180 tablet, Rfl: 3   diltiazem (CARDIZEM) 30 MG tablet, Take 1 tablet (30 mg total) by mouth 3 (three) times daily., Disp: 90 tablet, Rfl: 2   famotidine (PEPCID) 20 MG tablet, Take 20 mg by mouth 2 (two) times daily., Disp: , Rfl:  Iron, Ferrous Sulfate, 325 (65 Fe) MG TABS, Take 1 tablet by mouth daily with breakfast., Disp: , Rfl:    loratadine (CLARITIN) 10 MG tablet, Take 10 mg by mouth daily. , Disp: , Rfl:    losartan (COZAAR) 25 MG tablet, Take 25 mg by mouth daily., Disp: , Rfl:    Multiple Vitamins-Minerals (PRESERVISION AREDS 2) CAPS, Take 2  capsules by mouth daily., Disp: , Rfl:    rosuvastatin (CRESTOR) 20 MG tablet, Take 1 tablet by mouth once daily, Disp: 90 tablet, Rfl: 0   tolterodine (DETROL LA) 4 MG 24 hr capsule, Take 1 capsule (4 mg total) by mouth daily., Disp: 30 capsule, Rfl: 11   apixaban (ELIQUIS) 5 MG TABS tablet, Take 1 tablet (5 mg total) by mouth 2 (two) times daily., Disp: 60 tablet, Rfl: 2   Subjective:   PATIENT ID: Audrey Wolf GENDER: female DOB: May 10, 1950, MRN: [id number]  Chief Complaint  Patient presents with   pulmonary consult    SOB with exertion and occ wheezing. Covid/admission 11/2021.    HPI  Audrey Wolf is a 71 year old female with a past medical history of CAD, status post CABG, heart failure with preserved ejection fraction, atrial fibrillation, and a report of asthma who presents to clinic for the evaluation of shortness of breath.  Patient reports that she has been having exertional dyspnea for over a year that worsened after her recent admission.  She was admitted and October 2023 for an upper respiratory tract infection that progressed into bronchitis complicated by strep. Viridins bacteremia.  She completed 6 weeks of IV antibiotics and has been following with infectious diseases for said condition.  She reports she has a solitary kidney and has followed with nephrology and cardiology.  She tells me the reason she is not on any diuretics is because her kidney function.  She now reports shortness of breath that is exertional and at rest.  She does not have an occasional cough or wheeze and does not produce any sputum. She is unable to walk 10 feet without feeling short of breath. She reports no worsening lower extremity edema.  She has orthopnea and has to sleep either sitting in her recliner or with multiple pillows secondary to the shortness of breath.  She does not have any chest pain, chest tightness, fevers, chills, night sweats, or weight loss.  She is not very careful with her  food intake and does have a significant salt intake (reports eating a lot of canned soups).  She reports being diagnosed with asthma over 30 years ago and she has 2 inhalers (albuterol and Symbicort).  She uses her Symbicort 2 times a week and her albuterol 3 times a day with minimal improvement.  She is a lifelong non-smoker and currently works at [employer].  She is inquiring about the possibility of extending her short-term disability.  Ancillary information including prior medications, full medical/surgical/family/social histories, and PFTs (when available) are listed below and have been reviewed.   Review of Systems  Constitutional:  Negative for chills, fever and weight loss.  Respiratory:  Positive for shortness of breath. Negative for sputum production and wheezing.   Cardiovascular:  Negative for chest pain.     Objective:   Vitals:   01/10/22 0920  BP: (!) 140/60  Pulse: 72  Temp: 98.1 F (36.7 C)  TempSrc: Temporal  SpO2: 98%  Weight: 195 lb 6.4 oz (88.6 kg)  Height: 5\' 9"  (1.753 m)   98% on RA  BMI  Readings from Last 3 Encounters:  01/10/22 28.86 kg/m  12/30/21 29.33 kg/m  12/16/21 29.06 kg/m   Wt Readings from Last 3 Encounters:  01/10/22 195 lb 6.4 oz (88.6 kg)  12/30/21 198 lb 9.6 oz (90.1 kg)  12/16/21 196 lb 12.8 oz (89.3 kg)    Physical Exam Constitutional:      General: She is not in acute distress.    Appearance: She is obese. She is not ill-appearing.  HENT:     Mouth/Throat:     Mouth: Mucous membranes are moist.  Eyes:     Extraocular Movements: Extraocular movements intact.  Cardiovascular:     Rate and Rhythm: Normal rate and regular rhythm.  Pulmonary:     Effort: Pulmonary effort is normal.     Breath sounds: Rales (Over the bilateral bases) present.  Abdominal:     Palpations: Abdomen is soft.  Musculoskeletal:     Cervical back: Neck supple.  Neurological:     General: No focal deficit present.     Mental Status: She is alert and  oriented to person, place, and time.       Ancillary Information    Past Medical History:  Diagnosis Date   Arthritis    Asthma    CHF (congestive heart failure) (HCC)    Chronic kidney disease    Coronary artery disease    GERD (gastroesophageal reflux disease)    Headache    Hypertension    Nephrolithiasis    NSTEMI (non-ST elevated myocardial infarction) ([place])    Postprandial RUQ pain 05/25/2019     Family History  Problem Relation Age of Onset   Arthritis Mother    Arthritis Father    Heart disease Father    Stroke Father    Sudden Cardiac Death Father    Stroke Son    Breast cancer Maternal Aunt      Past Surgical History:  Procedure Laterality Date   ABDOMINAL HYSTERECTOMY     APPENDECTOMY     BREAST BIOPSY Left 12/31/2010   neg   CORONARY ARTERY BYPASS GRAFT N/A 05/22/2017   Procedure: CORONARY ARTERY BYPASS GRAFTING (CABG) x 2 WITH ENDOSCOPIC HARVESTING OF RIGHT SAPHENOUS VEIN;  Surgeon: Grace Isaac, MD;  Location: [place];  Service: Open Heart Surgery;  Laterality: N/A;   LEFT HEART CATH AND CORONARY ANGIOGRAPHY N/A 05/18/2017   Procedure: LEFT HEART CATH AND CORONARY ANGIOGRAPHY;  Surgeon: Dionisio David, MD;  Location: [facility];  Service: Cardiovascular;  Laterality: N/A;   LITHOTRIPSY     TEE WITHOUT CARDIOVERSION N/A 05/22/2017   Procedure: TRANSESOPHAGEAL ECHOCARDIOGRAM (TEE);  Surgeon: Grace Isaac, MD;  Location: [place];  Service: Open Heart Surgery;  Laterality: N/A;   TONSILLECTOMY      Social History   Socioeconomic History   Marital status: Single    Spouse name: Not on file   Number of children: Not on file   Years of education: Not on file   Highest education level: Not on file  Occupational History    Employer: [employer]  Tobacco Use   Smoking status: Never   Smokeless tobacco: Never  Vaping Use   Vaping Use: Never used  Substance and Sexual Activity   Alcohol use: No   Drug use: No   Sexual activity: Not  Currently    Partners: Male    Birth control/protection: Post-menopausal  Other Topics Concern   Not on file  Social History Narrative   Not on  file   Social Determinants of Health   Financial Resource Strain: Low Risk  (10/03/2021)   Overall Financial Resource Strain (CARDIA)    Difficulty of Paying Living Expenses: Not hard at all  Food Insecurity: No Food Insecurity (11/19/2021)   Hunger Vital Sign    Worried About Running Out of Food in the Last Year: Never true    Ran Out of Food in the Last Year: Never true  Transportation Needs: No Transportation Needs (11/19/2021)   PRAPARE - Administrator, Civil Service (Medical): No    Lack of Transportation (Non-Medical): No  Physical Activity: Sufficiently Active (10/03/2021)   Exercise Vital Sign    Days of Exercise per Week: 5 days    Minutes of Exercise per Session: 30 min  Stress: No Stress Concern Present (10/03/2021)   [name]    Feeling of Stress : Only a little  Social Connections: Unknown (10/03/2021)   Social Connection and Isolation Panel [NHANES]    Frequency of Communication with Friends and Family: More than three times a week    Frequency of Social Gatherings with Friends and Family: More than three times a week    Attends Religious Services: Not on Marketing executive or Organizations: Not on file    Attends Banker Meetings: Not on file    Marital Status: Not on file  Intimate Partner Violence: Not At Risk (11/19/2021)   Humiliation, Afraid, Rape, and Kick questionnaire    Fear of Current or Ex-Partner: No    Emotionally Abused: No    Physically Abused: No    Sexually Abused: No     Allergies  Allergen Reactions   Lisinopril Cough   Ace Inhibitors Other (See Comments)    Has been told to avoid these and any diuretics because "left kidney has shrunk up and doesn't work"     CBC    Component Value  Date/Time   WBC 5.1 12/30/2021 1308   RBC 3.81 (L) 12/30/2021 1308   HGB 10.0 (L) 12/30/2021 1308   HGB 14.5 03/01/2012 1132   HCT 31.2 (L) 12/30/2021 1308   HCT 41.5 03/01/2012 1132   PLT 220.0 12/30/2021 1308   PLT 244 03/01/2012 1132   MCV 81.8 12/30/2021 1308   MCV 90 03/01/2012 1132   MCH 25.2 (L) 11/26/2021 1446   MCHC 32.0 12/30/2021 1308   RDW 18.6 (H) 12/30/2021 1308   RDW 13.5 03/01/2012 1132   LYMPHSABS 0.8 11/21/2021 0550   LYMPHSABS 1.8 03/01/2012 1132   MONOABS 0.5 11/21/2021 0550   MONOABS 0.5 03/01/2012 1132   EOSABS 0.0 11/21/2021 0550   EOSABS 0.1 03/01/2012 1132   BASOSABS 0.0 11/21/2021 0550   BASOSABS 0.0 03/01/2012 1132    Pulmonary Functions Testing Results:    Latest Ref Rng & Units 05/20/2017    9:39 AM  PFT Results  FVC-Pre L 1.93   FVC-Predicted Pre % 52   Pre FEV1/FVC % % 73   FEV1-Pre L 1.42   FEV1-Predicted Pre % 49     Outpatient Medications Prior to Visit  Medication Sig Dispense Refill   acetaminophen (TYLENOL) 500 MG tablet Take 1 tablet (500 mg total) by mouth daily as needed for mild pain or headache. 30 tablet 0   albuterol (VENTOLIN HFA) 108 (90 Base) MCG/ACT inhaler SMARTSIG:2 Puff(s) By Mouth Every 4-6 Hours PRN     azelastine (ASTELIN) 0.1 % nasal spray  Place 2 sprays into both nostrils 2 (two) times daily. Use in each nostril as directed 30 mL 12   budesonide-formoterol (SYMBICORT) 80-4.5 MCG/ACT inhaler Inhale 2 puffs into the lungs 2 (two) times daily. 1 each 3   carvedilol (COREG) 25 MG tablet TAKE 1 TABLET BY MOUTH TWICE DAILY WITH MEALS 180 tablet 3   diltiazem (CARDIZEM) 30 MG tablet Take 1 tablet (30 mg total) by mouth 3 (three) times daily. 90 tablet 2   famotidine (PEPCID) 20 MG tablet Take 20 mg by mouth 2 (two) times daily.     Iron, Ferrous Sulfate, 325 (65 Fe) MG TABS Take 1 tablet by mouth daily with breakfast.     loratadine (CLARITIN) 10 MG tablet Take 10 mg by mouth daily.      losartan (COZAAR) 25 MG tablet  Take 25 mg by mouth daily.     Multiple Vitamins-Minerals (PRESERVISION AREDS 2) CAPS Take 2 capsules by mouth daily.     rosuvastatin (CRESTOR) 20 MG tablet Take 1 tablet by mouth once daily 90 tablet 0   tolterodine (DETROL LA) 4 MG 24 hr capsule Take 1 capsule (4 mg total) by mouth daily. 30 capsule 11   apixaban (ELIQUIS) 5 MG TABS tablet Take 1 tablet (5 mg total) by mouth 2 (two) times daily. 60 tablet 2   No facility-administered medications prior to visit.

## 2022-01-12 ENCOUNTER — Encounter: Payer: Self-pay | Admitting: *Deleted

## 2022-01-14 NOTE — Telephone Encounter (Signed)
I need to see the patient to be able to complete this form. I can't appropriately answer the restriction questions they are asking without an exam. If this is due on 12/18 she will need to be seen prior to then to complete this.

## 2022-01-15 NOTE — Telephone Encounter (Signed)
The message noted that she wants to return to work on 12/30. I evaluated her in November to determine that she needed to be out longer, not to determine that she could return to work. I can not say that she is ready to return to work on 12/30 without seeing her again.

## 2022-01-20 ENCOUNTER — Telehealth: Payer: Self-pay

## 2022-01-20 NOTE — Telephone Encounter (Signed)
Patient had a form for disability from her job that was due on today, per the provider I called [place] and spoke with a representative to let them know the patient is scheduled to see the provider on 01/29/2022 and he will see if she needs any restrictions, the representative stated her return to work date is 02/08/2021.  And they will leave that date until she is seen by the PCP.  Armarion Greek,cma

## 2022-01-21 DIAGNOSIS — H9122 Sudden idiopathic hearing loss, left ear: Secondary | ICD-10-CM | POA: Diagnosis not present

## 2022-01-21 DIAGNOSIS — H6123 Impacted cerumen, bilateral: Secondary | ICD-10-CM | POA: Diagnosis not present

## 2022-01-21 DIAGNOSIS — H903 Sensorineural hearing loss, bilateral: Secondary | ICD-10-CM | POA: Diagnosis not present

## 2022-01-28 DIAGNOSIS — N2581 Secondary hyperparathyroidism of renal origin: Secondary | ICD-10-CM | POA: Diagnosis not present

## 2022-01-28 DIAGNOSIS — D631 Anemia in chronic kidney disease: Secondary | ICD-10-CM | POA: Diagnosis not present

## 2022-01-28 DIAGNOSIS — N1831 Chronic kidney disease, stage 3a: Secondary | ICD-10-CM | POA: Diagnosis not present

## 2022-01-28 DIAGNOSIS — I10 Essential (primary) hypertension: Secondary | ICD-10-CM | POA: Diagnosis not present

## 2022-01-29 ENCOUNTER — Ambulatory Visit: Payer: Medicare Other | Admitting: Family Medicine

## 2022-02-06 ENCOUNTER — Telehealth: Payer: Self-pay

## 2022-02-06 NOTE — Telephone Encounter (Signed)
Return to work form was faxed in and needs to be completed by January 25th and faxed back.  Placed in sign basket.  ng

## 2022-02-06 NOTE — Telephone Encounter (Signed)
Plan to discuss at visit on 02/07/21.

## 2022-02-07 ENCOUNTER — Encounter: Payer: Self-pay | Admitting: Family Medicine

## 2022-02-07 ENCOUNTER — Ambulatory Visit (INDEPENDENT_AMBULATORY_CARE_PROVIDER_SITE_OTHER): Payer: BC Managed Care – PPO | Admitting: Family Medicine

## 2022-02-07 VITALS — BP 130/60 | HR 63 | Temp 97.5°F | Ht 69.0 in | Wt 196.4 lb

## 2022-02-07 DIAGNOSIS — R0600 Dyspnea, unspecified: Secondary | ICD-10-CM | POA: Diagnosis not present

## 2022-02-07 DIAGNOSIS — J4520 Mild intermittent asthma, uncomplicated: Secondary | ICD-10-CM

## 2022-02-07 DIAGNOSIS — R42 Dizziness and giddiness: Secondary | ICD-10-CM

## 2022-02-07 DIAGNOSIS — I151 Hypertension secondary to other renal disorders: Secondary | ICD-10-CM

## 2022-02-07 DIAGNOSIS — R079 Chest pain, unspecified: Secondary | ICD-10-CM | POA: Diagnosis not present

## 2022-02-07 NOTE — Patient Instructions (Signed)
Nice to see you. If you develop persistent chest pain please go to the emergency room. Please see your cardiologist next week as planned.

## 2022-02-07 NOTE — Assessment & Plan Note (Signed)
This continues to be an ongoing issue.  She has seen pulmonology and they made recommendations to use her Symbicort as a rescue inhaler.  She sees her cardiologist next week.  At this point I think she can return to work after she sees her cardiologist unless they think she needs to be out longer.  If they feel she needs to be out longer they can manage her short-term disability.  She notes she does not have long-term disability.  Her short-term disability paperwork will be filled out and sent to [facility].

## 2022-02-07 NOTE — Progress Notes (Signed)
Tommi Rumps, MD Phone: [phone]  Audrey Wolf is a 72 y.o. female who presents today for follow-up.  Hypertension: Patient reports this is good at home.  She is on carvedilol, losartan, and diltiazem.  No edema.  Dyspnea: She notes this is about the same.  Some days she is able to walk across the house with no issues and other days she has shortness of breath.  She intermittently has chest tightness that occurs with exertion and only lasts a few minutes.  She sees cardiology next week.  She has been out of work on short-term disability.  She did see pulmonology for her dyspnea and they had her stop the albuterol and use the Symbicort as a rescue inhaler.  She notes that has been helpful.  Ear fullness: Patient saw ENT and they placed her on prednisone 10 mg 3 times daily.  She is finished this and the ear fullness has resolved.  Social History   Tobacco Use  Smoking Status Never  Smokeless Tobacco Never    Current Outpatient Medications on File Prior to Visit  Medication Sig Dispense Refill   acetaminophen (TYLENOL) 500 MG tablet Take 1 tablet (500 mg total) by mouth daily as needed for mild pain or headache. 30 tablet 0   albuterol (VENTOLIN HFA) 108 (90 Base) MCG/ACT inhaler SMARTSIG:2 Puff(s) By Mouth Every 4-6 Hours PRN     azelastine (ASTELIN) 0.1 % nasal spray Place 2 sprays into both nostrils 2 (two) times daily. Use in each nostril as directed 30 mL 12   budesonide-formoterol (SYMBICORT) 80-4.5 MCG/ACT inhaler Inhale 2 puffs into the lungs 2 (two) times daily. 1 each 3   carvedilol (COREG) 25 MG tablet TAKE 1 TABLET BY MOUTH TWICE DAILY WITH MEALS 180 tablet 3   diltiazem (CARDIZEM) 30 MG tablet Take 1 tablet (30 mg total) by mouth 3 (three) times daily. 90 tablet 2   famotidine (PEPCID) 20 MG tablet Take 20 mg by mouth 2 (two) times daily.     Iron, Ferrous Sulfate, 325 (65 Fe) MG TABS Take 1 tablet by mouth daily with breakfast.     loratadine (CLARITIN) 10 MG  tablet Take 10 mg by mouth daily.      losartan (COZAAR) 25 MG tablet Take 25 mg by mouth daily.     Multiple Vitamins-Minerals (PRESERVISION AREDS 2) CAPS Take 2 capsules by mouth daily.     predniSONE (STERAPRED UNI-PAK 48 TAB) 10 MG (48) TBPK tablet Take by mouth as directed.     rosuvastatin (CRESTOR) 20 MG tablet Take 1 tablet by mouth once daily 90 tablet 0   tolterodine (DETROL LA) 4 MG 24 hr capsule Take 1 capsule (4 mg total) by mouth daily. 30 capsule 11   apixaban (ELIQUIS) 5 MG TABS tablet Take 1 tablet (5 mg total) by mouth 2 (two) times daily. 60 tablet 2   No current facility-administered medications on file prior to visit.     ROS see history of present illness  Objective  Physical Exam Vitals:   02/07/22 1140  BP: 130/60  Pulse: 63  Temp: (!) 97.5 F (36.4 C)  SpO2: 99%    BP Readings from Last 3 Encounters:  02/07/22 130/60  01/10/22 (!) 140/60  12/30/21 (!) 150/70   Wt Readings from Last 3 Encounters:  02/07/22 196 lb 6.4 oz (89.1 kg)  01/10/22 195 lb 6.4 oz (88.6 kg)  12/30/21 198 lb 9.6 oz (90.1 kg)    Physical Exam Constitutional:  General: She is not in acute distress.    Appearance: She is not diaphoretic.  Cardiovascular:     Rate and Rhythm: Normal rate and regular rhythm.     Heart sounds: Normal heart sounds.  Pulmonary:     Effort: Pulmonary effort is normal.     Breath sounds: Normal breath sounds.  Skin:    General: Skin is warm and dry.  Neurological:     Mental Status: She is alert.    EKG: Sinus rhythm, rate 62, RSR prime V1, no ischemic changes noted   Assessment/Plan: Please see individual problem list.  Chest pain, unspecified type Assessment & Plan: Patient reports this is a chronic intermittent issue.  Possibly related to stable angina or anxiety.  EKG reassuring today.  She will see her cardiologist for further evaluation.  She was advised to go to the emergency room if she develops persistent chest  pain.  Orders: -     EKG 12-Lead  Hypertension secondary to other renal disorders Assessment & Plan: Chronic issue.  Well-controlled.  She will continue carvedilol 25 mg twice daily, diltiazem 30 mg 3 times daily, and losartan 25 mg daily.   Mild intermittent asthma, unspecified whether complicated Assessment & Plan: Chronic issue.  She will continue Symbicort 2 puffs twice daily and as needed as rescue inhaler.   Dyspnea, unspecified type Assessment & Plan: This continues to be an ongoing issue.  She has seen pulmonology and they made recommendations to use her Symbicort as a rescue inhaler.  She sees her cardiologist next week.  At this point I think she can return to work after she sees her cardiologist unless they think she needs to be out longer.  If they feel she needs to be out longer they can manage her short-term disability.  She notes she does not have long-term disability.  Her short-term disability paperwork will be filled out and sent to [facility].   Dizziness Assessment & Plan: Ear fullness has resolved.  She will monitor.     Return in about 3 months (around 05/09/2022).   Tommi Rumps, MD [place]

## 2022-02-07 NOTE — Assessment & Plan Note (Signed)
Ear fullness has resolved.  She will monitor.

## 2022-02-07 NOTE — Assessment & Plan Note (Addendum)
Patient reports this is a chronic intermittent issue.  Possibly related to stable angina or anxiety.  EKG reassuring today.  She will see her cardiologist for further evaluation.  She was advised to go to the emergency room if she develops persistent chest pain.

## 2022-02-07 NOTE — Assessment & Plan Note (Signed)
Chronic issue.  Well-controlled.  She will continue carvedilol 25 mg twice daily, diltiazem 30 mg 3 times daily, and losartan 25 mg daily.

## 2022-02-07 NOTE — Assessment & Plan Note (Signed)
Chronic issue.  She will continue Symbicort 2 puffs twice daily and as needed as rescue inhaler.

## 2022-02-14 DIAGNOSIS — I10 Essential (primary) hypertension: Secondary | ICD-10-CM | POA: Diagnosis not present

## 2022-02-14 DIAGNOSIS — I340 Nonrheumatic mitral (valve) insufficiency: Secondary | ICD-10-CM | POA: Diagnosis not present

## 2022-02-14 DIAGNOSIS — I25810 Atherosclerosis of coronary artery bypass graft(s) without angina pectoris: Secondary | ICD-10-CM | POA: Diagnosis not present

## 2022-02-14 DIAGNOSIS — R0602 Shortness of breath: Secondary | ICD-10-CM | POA: Diagnosis not present

## 2022-02-14 DIAGNOSIS — E782 Mixed hyperlipidemia: Secondary | ICD-10-CM | POA: Diagnosis not present

## 2022-02-14 DIAGNOSIS — I2510 Atherosclerotic heart disease of native coronary artery without angina pectoris: Secondary | ICD-10-CM | POA: Diagnosis not present

## 2022-02-21 ENCOUNTER — Telehealth: Payer: Self-pay | Admitting: Family Medicine

## 2022-02-21 NOTE — Telephone Encounter (Signed)
Pt called in staying that she was out of work for 3 months, and went back Monday 1/15 and her back its really in pain. As per pt its really bothering her and she wants to know if Dr. Caryl Bis needs to see her for this or prescribed some pain killers. She would like a call back on what to do.

## 2022-02-21 NOTE — Telephone Encounter (Signed)
I called the patient and she is scheduled to be seen on Monday.  ng

## 2022-02-21 NOTE — Telephone Encounter (Signed)
She needs to be seen for this  Thanks

## 2022-02-22 ENCOUNTER — Other Ambulatory Visit: Payer: Self-pay | Admitting: Family Medicine

## 2022-02-24 ENCOUNTER — Ambulatory Visit: Payer: Medicare Other | Admitting: Family Medicine

## 2022-02-26 ENCOUNTER — Encounter: Payer: Self-pay | Admitting: Nurse Practitioner

## 2022-02-26 ENCOUNTER — Ambulatory Visit (INDEPENDENT_AMBULATORY_CARE_PROVIDER_SITE_OTHER): Payer: BC Managed Care – PPO | Admitting: Nurse Practitioner

## 2022-02-26 ENCOUNTER — Ambulatory Visit
Admission: RE | Admit: 2022-02-26 | Discharge: 2022-02-26 | Disposition: A | Discharge status: Home / Self Care | Payer: BC Managed Care – PPO | Source: Ambulatory Visit | Attending: Nurse Practitioner | Admitting: Nurse Practitioner

## 2022-02-26 VITALS — BP 134/60 | HR 60 | Temp 98.2°F | Ht 69.0 in | Wt 200.8 lb

## 2022-02-26 DIAGNOSIS — M8588 Other specified disorders of bone density and structure, other site: Secondary | ICD-10-CM | POA: Diagnosis not present

## 2022-02-26 DIAGNOSIS — M545 Low back pain, unspecified: Secondary | ICD-10-CM | POA: Insufficient documentation

## 2022-02-26 DIAGNOSIS — M47816 Spondylosis without myelopathy or radiculopathy, lumbar region: Secondary | ICD-10-CM | POA: Diagnosis not present

## 2022-02-26 DIAGNOSIS — M4316 Spondylolisthesis, lumbar region: Secondary | ICD-10-CM | POA: Diagnosis not present

## 2022-02-26 MED ORDER — BACLOFEN 10 MG PO TABS: 10.0000 mg | ORAL_TABLET | Freq: Three times a day (TID) | ORAL | 0 refills | Status: DC | PRN

## 2022-02-26 NOTE — Progress Notes (Signed)
Tomasita Morrow, NP-C Phone: [phone]  Audrey Wolf is a 72 y.o. female who presents today for low back pain.  Patient reports a history of chronic back pain. She has had worsening symptoms since returning to work 9 days ago after being off for an extended period of time. She reports the pain has improved slightly over the last 4 days. She is using OTC Salonpas patches and a heating pad. She has been doing at home exercises she was previously given to help with her pain, she reports these have been helpful. Denies numbness and tingling in her lower extremities. Denies radiation down legs. Denies any problems with bowel or bladder function. Reports pain is better with sitting and worse with standing. She is on her feet most of the day at work.    Social History   Tobacco Use  Smoking Status Never  Smokeless Tobacco Never    Current Outpatient Medications on File Prior to Visit  Medication Sig Dispense Refill   acetaminophen (TYLENOL) 500 MG tablet Take 1 tablet (500 mg total) by mouth daily as needed for mild pain or headache. 30 tablet 0   albuterol (VENTOLIN HFA) 108 (90 Base) MCG/ACT inhaler SMARTSIG:2 Puff(s) By Mouth Every 4-6 Hours PRN     azelastine (ASTELIN) 0.1 % nasal spray Place 2 sprays into both nostrils 2 (two) times daily. Use in each nostril as directed 30 mL 12   budesonide-formoterol (SYMBICORT) 80-4.5 MCG/ACT inhaler Inhale 2 puffs into the lungs 2 (two) times daily. 1 each 3   carvedilol (COREG) 25 MG tablet TAKE 1 TABLET BY MOUTH TWICE DAILY WITH MEALS 180 tablet 3   famotidine (PEPCID) 20 MG tablet Take 20 mg by mouth 2 (two) times daily.     Iron, Ferrous Sulfate, 325 (65 Fe) MG TABS Take 1 tablet by mouth daily with breakfast.     loratadine (CLARITIN) 10 MG tablet Take 10 mg by mouth daily.      losartan (COZAAR) 25 MG tablet Take 25 mg by mouth daily.     Multiple Vitamins-Minerals (PRESERVISION AREDS 2) CAPS Take 2 capsules by mouth daily.     predniSONE  (STERAPRED UNI-PAK 48 TAB) 10 MG (48) TBPK tablet Take by mouth as directed.     rosuvastatin (CRESTOR) 20 MG tablet Take 1 tablet by mouth once daily 90 tablet 0   tolterodine (DETROL LA) 4 MG 24 hr capsule Take 1 capsule (4 mg total) by mouth daily. 30 capsule 11   apixaban (ELIQUIS) 5 MG TABS tablet Take 1 tablet (5 mg total) by mouth 2 (two) times daily. 60 tablet 2   diltiazem (CARDIZEM) 30 MG tablet Take 1 tablet (30 mg total) by mouth 3 (three) times daily. 90 tablet 2   No current facility-administered medications on file prior to visit.     ROS see history of present illness  Objective  Physical Exam Vitals:   02/26/22 1515  BP: 134/60  Pulse: 60  Temp: 98.2 F (36.8 C)  SpO2: 98%    BP Readings from Last 3 Encounters:  02/26/22 134/60  02/07/22 130/60  01/10/22 (!) 140/60   Wt Readings from Last 3 Encounters:  02/26/22 200 lb 12.8 oz (91.1 kg)  02/07/22 196 lb 6.4 oz (89.1 kg)  01/10/22 195 lb 6.4 oz (88.6 kg)    Physical Exam Constitutional:      General: She is not in acute distress.    Appearance: Normal appearance.  HENT:     Head: Normocephalic.  Cardiovascular:     Rate and Rhythm: Normal rate and regular rhythm.     Heart sounds: Normal heart sounds.  Pulmonary:     Effort: Pulmonary effort is normal.     Breath sounds: Normal breath sounds.  Musculoskeletal:        General: No swelling, tenderness, deformity or signs of injury. Normal range of motion.  Skin:    General: Skin is warm and dry.  Neurological:     Mental Status: She is alert.     Sensory: No sensory deficit.     Comments: 5/5 strength in bilateral lower extremities.  Psychiatric:        Mood and Affect: Mood normal.        Behavior: Behavior normal.    Assessment/Plan: Please see individual problem list.  Acute bilateral low back pain without sciatica Assessment & Plan: X-ray lumbar spine pending. Will contact patient with results. Encouraged patient to continue Salonpas  patches and ice/heat the painful area. Continue at home exercises. Discussed with patient likely due to being sedentary and now back on feet most of the day and moving at work. She can take Baclofen TID PRN. Offered physical therapy if symptoms are not improving.  Orders: -     DG Lumbar Spine 2-3 Views -     Baclofen; Take 1 tablet (10 mg total) by mouth 3 (three) times daily as needed.  Dispense: 30 each; Refill: 0   Return if symptoms worsen or fail to improve.   Tomasita Morrow, NP-C [place]

## 2022-02-26 NOTE — Assessment & Plan Note (Signed)
X-ray lumbar spine pending. Will contact patient with results. Encouraged patient to continue Salonpas patches and ice/heat the painful area. Continue at home exercises. Discussed with patient likely due to being sedentary and now back on feet most of the day and moving at work. She can take Baclofen TID PRN. Offered physical therapy if symptoms are not improving.

## 2022-03-20 ENCOUNTER — Other Ambulatory Visit: Payer: Self-pay

## 2022-03-20 ENCOUNTER — Ambulatory Visit: Payer: BC Managed Care – PPO | Admitting: Urology

## 2022-03-20 MED ORDER — DILTIAZEM HCL ER COATED BEADS 180 MG PO CP24: 180.0000 mg | ORAL_CAPSULE | Freq: Every day | ORAL | 11 refills | Status: DC

## 2022-03-24 ENCOUNTER — Other Ambulatory Visit: Payer: Self-pay

## 2022-04-16 ENCOUNTER — Ambulatory Visit (INDEPENDENT_AMBULATORY_CARE_PROVIDER_SITE_OTHER): Payer: BC Managed Care – PPO | Admitting: Urology

## 2022-04-16 ENCOUNTER — Encounter: Payer: Self-pay | Admitting: Urology

## 2022-04-16 VITALS — BP 136/67 | HR 58 | Ht 69.0 in | Wt 198.0 lb

## 2022-04-16 DIAGNOSIS — N1339 Other hydronephrosis: Secondary | ICD-10-CM | POA: Diagnosis not present

## 2022-04-16 DIAGNOSIS — N3281 Overactive bladder: Secondary | ICD-10-CM | POA: Diagnosis not present

## 2022-04-16 LAB — BLADDER SCAN AMB NON-IMAGING

## 2022-04-16 MED ORDER — OXYBUTYNIN CHLORIDE ER 15 MG PO TB24: 15.0000 mg | ORAL_TABLET | Freq: Every day | ORAL | 11 refills | Status: DC

## 2022-04-16 NOTE — Progress Notes (Signed)
   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  She was previously managed by Dr. Jacqlyn Larsen who attempted to treat her left distal ureteral stone in 2015, but was unable to access the stone or place a stent and they opted for observation for her chronically obstructed atrophic left kidney. They opted for observation and her left distal ureteral stone remains in situ with chronic left hydronephrosis and left renal atrophy.   Most recent renal imaging from April 2021 ultrasound shows severe left hydronephrosis with severe left renal cortical thinning which is chronic and stable from 2014.  Renal function has actually stabilized and improved over the last year with recent creatinine December 2023 1.24, EGFR 46, which is improved from creatinine 1.4, EGFR 40 at our last visit.  She also has overactive bladder, and has been on Detrol 4 mg daily.  She feels this has not been as effective and does have some urgency, urge incontinence, frequency, nocturia 2-3 times at night.  I think it is reasonable to trial oxybutynin 15 mg XL, and consider beta 3 agonist in the future if persistently bothersome overactive symptoms.  -No role for any urologic/surgical intervention for completely atrophic left kidney with severe hydronephrosis since at least 2014 from ureteral stone.  CKD management per nephrology, counseled on hypertension management.   -Trial of oxybutynin 15 mg XL daily for OAB, discontinue Detrol -RTC 3 months symptom check, could consider beta 3 agonist or PTNS for persistently bothersome OAB symptoms  Billey Co, MD  [facility] [address], [place], [place] [address] [phone]

## 2022-05-09 ENCOUNTER — Encounter: Payer: Self-pay | Admitting: Family Medicine

## 2022-05-09 ENCOUNTER — Ambulatory Visit (INDEPENDENT_AMBULATORY_CARE_PROVIDER_SITE_OTHER): Payer: BC Managed Care – PPO | Admitting: Family Medicine

## 2022-05-09 VITALS — BP 114/68 | HR 55 | Temp 98.3°F | Ht 69.0 in | Wt 201.2 lb

## 2022-05-09 DIAGNOSIS — J4520 Mild intermittent asthma, uncomplicated: Secondary | ICD-10-CM

## 2022-05-09 DIAGNOSIS — R202 Paresthesia of skin: Secondary | ICD-10-CM | POA: Insufficient documentation

## 2022-05-09 DIAGNOSIS — R7303 Prediabetes: Secondary | ICD-10-CM

## 2022-05-09 DIAGNOSIS — I151 Hypertension secondary to other renal disorders: Secondary | ICD-10-CM | POA: Diagnosis not present

## 2022-05-09 DIAGNOSIS — I480 Paroxysmal atrial fibrillation: Secondary | ICD-10-CM | POA: Diagnosis not present

## 2022-05-09 DIAGNOSIS — E785 Hyperlipidemia, unspecified: Secondary | ICD-10-CM

## 2022-05-09 DIAGNOSIS — D649 Anemia, unspecified: Secondary | ICD-10-CM

## 2022-05-09 DIAGNOSIS — B351 Tinea unguium: Secondary | ICD-10-CM | POA: Diagnosis not present

## 2022-05-09 NOTE — Assessment & Plan Note (Signed)
Fingernail changes are likely related to onychomycosis.  Discussed monitoring versus seeing dermatology versus trying topical treatments versus oral medication.  Discussed risk of liver damage with the oral medication.  Patient opts to monitor at this time.  She will let us know if she changes her mind.

## 2022-05-09 NOTE — Assessment & Plan Note (Signed)
I encouraged the patient to consistently use her Symbicort inhaler this time of year.

## 2022-05-09 NOTE — Assessment & Plan Note (Signed)
Patient with paresthesias in her left lower arm that occur intermittently.  Discussed this is likely nerve related issue though given that it encompasses the entire circumference of her left lower arm it is difficult to pinpoint the specific root cause.  Will refer to neurology to consider nerve conduction studies.

## 2022-05-09 NOTE — Assessment & Plan Note (Signed)
Chronic issue.  Sinus rhythm today.  She will continue Eliquis 5 mg twice daily and carvedilol 25 mg twice daily.

## 2022-05-09 NOTE — Patient Instructions (Signed)
Neurology should call you to schedule an evaluation for the tingling in your hand and arm. We will contact you with your lab results.

## 2022-05-09 NOTE — Progress Notes (Signed)
Audrey AlarEric Melita Villalona, MD Phone: [phone]  Audrey BimlerKatherine R Wolf is a 72 y.o. female who presents today for f/u.  HYPERTENSION Disease Monitoring Home BP Monitoring 132/65 Chest pain- no    Dyspnea- see below Medications Compliance-  taking losartan, coreg.   Edema- no BMET    Component Value Date/Time   NA 141 01/10/2022 1040   NA 144 08/19/2019 1533   NA 140 03/01/2012 1132   K 4.2 01/10/2022 1040   K 3.5 03/01/2012 1132   CL 113 (H) 01/10/2022 1040   CL 108 (H) 03/01/2012 1132   CO2 22 01/10/2022 1040   CO2 23 03/01/2012 1132   GLUCOSE 113 (H) 01/10/2022 1040   GLUCOSE 92 03/01/2012 1132   BUN 28 (H) 01/10/2022 1040   BUN 20 08/19/2019 1533   BUN 22 (H) 03/01/2012 1132   CREATININE 1.04 (H) 01/10/2022 1040   CREATININE 1.04 (H) 11/12/2018 1457   CALCIUM 9.2 01/10/2022 1040   CALCIUM 8.6 03/01/2012 1132   GFRNONAA 57 (L) 01/10/2022 1040   GFRNONAA >60 03/01/2012 1132   GFRAA 67 08/19/2019 1533   GFRAA >60 03/01/2012 1132   Asthma: Patient notes this time of year her asthma worsens some.  She has been using her Symbicort inhaler periodically.  Atrial fibrillation: Patient is on Eliquis and Coreg.  She occasionally has some palpitations that usually when she is at work and under stress.  No bleeding issues.  Pins and needle sensation left arm: Patient notes this has been going on a week or so.  It is occurring in the left arm below the elbow.  It occurs in the entire circumference of her lower left arm.  No neck pain.  No pain associated with this.  No injury.  Fingernails turning white: Patient reports her thumb nails and her right index finger have both turned white and she wonders what is causing this.  Social History   Tobacco Use  Smoking Status Never  Smokeless Tobacco Never    Current Outpatient Medications on File Prior to Visit  Medication Sig Dispense Refill   acetaminophen (TYLENOL) 500 MG tablet Take 1 tablet (500 mg total) by mouth daily as needed for  mild pain or headache. 30 tablet 0   albuterol (VENTOLIN HFA) 108 (90 Base) MCG/ACT inhaler SMARTSIG:2 Puff(s) By Mouth Every 4-6 Hours PRN     azelastine (ASTELIN) 0.1 % nasal spray Place 2 sprays into both nostrils 2 (two) times daily. Use in each nostril as directed 30 mL 12   budesonide-formoterol (SYMBICORT) 80-4.5 MCG/ACT inhaler Inhale 2 puffs into the lungs 2 (two) times daily. 1 each 3   carvedilol (COREG) 25 MG tablet TAKE 1 TABLET BY MOUTH TWICE DAILY WITH MEALS 180 tablet 3   diltiazem (CARDIZEM CD) 180 MG 24 hr capsule Take 1 capsule (180 mg total) by mouth daily. 30 capsule 11   famotidine (PEPCID) 20 MG tablet Take 20 mg by mouth 2 (two) times daily.     Iron, Ferrous Sulfate, 325 (65 Fe) MG TABS Take 1 tablet by mouth daily with breakfast.     loratadine (CLARITIN) 10 MG tablet Take 10 mg by mouth daily.      losartan (COZAAR) 25 MG tablet Take 25 mg by mouth daily.     Multiple Vitamins-Minerals (PRESERVISION AREDS 2) CAPS Take 2 capsules by mouth daily.     oxybutynin (DITROPAN XL) 15 MG 24 hr tablet Take 1 tablet (15 mg total) by mouth daily. 30 tablet 11   rosuvastatin (CRESTOR)  20 MG tablet Take 1 tablet by mouth once daily 90 tablet 0   apixaban (ELIQUIS) 5 MG TABS tablet Take 1 tablet (5 mg total) by mouth 2 (two) times daily. 60 tablet 2   No current facility-administered medications on file prior to visit.     ROS see history of present illness  Objective  Physical Exam Vitals:   05/09/22 1425  BP: 114/68  Pulse: (!) 55  Temp: 98.3 F (36.8 C)  SpO2: 98%    BP Readings from Last 3 Encounters:  05/09/22 114/68  04/16/22 136/67  02/26/22 134/60   Wt Readings from Last 3 Encounters:  05/09/22 201 lb 3.2 oz (91.3 kg)  04/16/22 198 lb (89.8 kg)  02/26/22 200 lb 12.8 oz (91.1 kg)    Physical Exam Constitutional:      General: She is not in acute distress.    Appearance: She is not diaphoretic.  Cardiovascular:     Rate and Rhythm: Normal rate and  regular rhythm.     Heart sounds: Normal heart sounds.  Pulmonary:     Effort: Pulmonary effort is normal.     Breath sounds: Normal breath sounds.  Musculoskeletal:     Comments: Patient has pain on Tinel's and Phalen's testing in her left wrist and hand though no tingling or numbness  Skin:    General: Skin is warm and dry.     Comments: Whitish discoloration and thickening of the bilateral thumbs and right index finger nails, does not reach the most proximal portion of the nailbed  Neurological:     Mental Status: She is alert.      Assessment/Plan: Please see individual problem list.  Hypertension secondary to other renal disorders Assessment & Plan: Chronic issue.  Adequately controlled.  She will continue carvedilol 25 mg twice daily, diltiazem 30 mg 3 times daily, and losartan 25 mg daily.   Paroxysmal atrial fibrillation Assessment & Plan: Chronic issue.  Sinus rhythm today.  She will continue Eliquis 5 mg twice daily and carvedilol 25 mg twice daily.   Onychomycosis Assessment & Plan: Fingernail changes are likely related to onychomycosis.  Discussed monitoring versus seeing dermatology versus trying topical treatments versus oral medication.  Discussed risk of liver damage with the oral medication.  Patient opts to monitor at this time.  She will let us know if she changes her mind.   Paresthesia Assessment & Plan: Patient with paresthesias in her left lower arm that occur intermittently.  Discussed this is likely nerve related issue though given that it encompasses the entire circumference of her left lower arm it is difficult to pinpoint the specific root cause.  Will refer to neurology to consider nerve conduction studies.  Orders: -     Ambulatory referral to Neurology  Hyperlipidemia, unspecified hyperlipidemia type -     Lipid panel -     COMPLETE METABOLIC PANEL WITH GFR  Prediabetes -     Hemoglobin A1c  Anemia, unspecified type -     CBC  Mild  intermittent asthma, unspecified whether complicated Assessment & Plan: I encouraged the patient to consistently use her Symbicort inhaler this time of year.     Return in about 6 months (around 11/08/2022).   Audrey Alar, MD [facility]

## 2022-05-09 NOTE — Assessment & Plan Note (Signed)
Chronic issue.  Adequately controlled.  She will continue carvedilol 25 mg twice daily, diltiazem 30 mg 3 times daily, and losartan 25 mg daily.

## 2022-05-10 LAB — COMPLETE METABOLIC PANEL WITH GFR
AG Ratio: 1.8 (calc) (ref 1.0–2.5)
ALT: 19 U/L (ref 6–29)
AST: 19 U/L (ref 10–35)
Albumin: 3.8 g/dL (ref 3.6–5.1)
Alkaline phosphatase (APISO): 68 U/L (ref 37–153)
BUN/Creatinine Ratio: 26 (calc) — ABNORMAL HIGH (ref 6–22)
BUN: 28 mg/dL — ABNORMAL HIGH (ref 7–25)
CO2: 24 mmol/L (ref 20–32)
Calcium: 8.9 mg/dL (ref 8.6–10.4)
Chloride: 110 mmol/L (ref 98–110)
Creat: 1.08 mg/dL — ABNORMAL HIGH (ref 0.60–1.00)
Globulin: 2.1 g/dL (calc) (ref 1.9–3.7)
Glucose, Bld: 94 mg/dL (ref 65–99)
Potassium: 4.2 mmol/L (ref 3.5–5.3)
Sodium: 142 mmol/L (ref 135–146)
Total Bilirubin: 0.5 mg/dL (ref 0.2–1.2)
Total Protein: 5.9 g/dL — ABNORMAL LOW (ref 6.1–8.1)
eGFR: 55 mL/min/{1.73_m2} — ABNORMAL LOW (ref >=60)

## 2022-05-10 LAB — HEMOGLOBIN A1C
Hgb A1c MFr Bld: 5.4 % of total Hgb (ref <5.7)
Mean Plasma Glucose: 108 mg/dL
eAG (mmol/L): 6 mmol/L

## 2022-05-10 LAB — LIPID PANEL
Cholesterol: 118 mg/dL (ref <200)
HDL: 40 mg/dL — ABNORMAL LOW (ref >=50)
LDL Cholesterol (Calc): 56 mg/dL (calc)
Non-HDL Cholesterol (Calc): 78 mg/dL (calc) (ref <130)
Total CHOL/HDL Ratio: 3 (calc) (ref <5.0)
Triglycerides: 140 mg/dL (ref <150)

## 2022-05-10 LAB — CBC
HCT: 36.6 % (ref 35.0–45.0)
Hemoglobin: 12.7 g/dL (ref 11.7–15.5)
MCH: 31.8 pg (ref 27.0–33.0)
MCHC: 34.7 g/dL (ref 32.0–36.0)
MCV: 91.7 fL (ref 80.0–100.0)
MPV: 11.4 fL (ref 7.5–12.5)
Platelets: 203 10*3/uL (ref 140–400)
RBC: 3.99 10*6/uL (ref 3.80–5.10)
RDW: 13.8 % (ref 11.0–15.0)
WBC: 5.8 10*3/uL (ref 3.8–10.8)

## 2022-05-16 ENCOUNTER — Ambulatory Visit (INDEPENDENT_AMBULATORY_CARE_PROVIDER_SITE_OTHER): Payer: BC Managed Care – PPO | Admitting: Cardiovascular Disease

## 2022-05-16 ENCOUNTER — Encounter: Payer: Self-pay | Admitting: Cardiovascular Disease

## 2022-05-16 VITALS — BP 150/80 | HR 62 | Ht 69.0 in | Wt 202.2 lb

## 2022-05-16 DIAGNOSIS — I5032 Chronic diastolic (congestive) heart failure: Secondary | ICD-10-CM | POA: Diagnosis not present

## 2022-05-16 DIAGNOSIS — I151 Hypertension secondary to other renal disorders: Secondary | ICD-10-CM | POA: Diagnosis not present

## 2022-05-16 DIAGNOSIS — I480 Paroxysmal atrial fibrillation: Secondary | ICD-10-CM | POA: Diagnosis not present

## 2022-05-16 DIAGNOSIS — E782 Mixed hyperlipidemia: Secondary | ICD-10-CM | POA: Diagnosis not present

## 2022-05-16 DIAGNOSIS — I2510 Atherosclerotic heart disease of native coronary artery without angina pectoris: Secondary | ICD-10-CM | POA: Diagnosis not present

## 2022-05-16 NOTE — Progress Notes (Signed)
Cardiology Office Note   Date:  05/16/2022   ID:  Audrey Wolf, DOB Sep 10, 1950, MRN [id number]  PCP:  Glori Luis, MD  Cardiologist:  Adrian Blackwater, MD      History of Present Illness: Audrey Wolf is a 72 y.o. female who presents for  Chief Complaint  Patient presents with   Follow-up    4 month follow up    Patient in office for routine cardiac exam. Denies chest pain, edema, palpitations. Patient reports shortness of breath on exertion.     Past Medical History:  Diagnosis Date   Arthritis    Asthma    CHF (congestive heart failure)    Chronic kidney disease    Coronary artery disease    GERD (gastroesophageal reflux disease)    Headache    Hypertension    Nephrolithiasis    NSTEMI (non-ST elevated myocardial infarction)    Postprandial RUQ pain 05/25/2019     Past Surgical History:  Procedure Laterality Date   ABDOMINAL HYSTERECTOMY     APPENDECTOMY     BREAST BIOPSY Left 12/31/2010   neg   CORONARY ARTERY BYPASS GRAFT N/A 05/22/2017   Procedure: CORONARY ARTERY BYPASS GRAFTING (CABG) x 2 WITH ENDOSCOPIC HARVESTING OF RIGHT SAPHENOUS VEIN;  Surgeon: Delight Ovens, MD;  Location: [facility] OR;  Service: Open Heart Surgery;  Laterality: N/A;   LEFT HEART CATH AND CORONARY ANGIOGRAPHY N/A 05/18/2017   Procedure: LEFT HEART CATH AND CORONARY ANGIOGRAPHY;  Surgeon: Laurier Nancy, MD;  Location: [facility] INVASIVE CV LAB;  Service: Cardiovascular;  Laterality: N/A;   LITHOTRIPSY     TEE WITHOUT CARDIOVERSION N/A 05/22/2017   Procedure: TRANSESOPHAGEAL ECHOCARDIOGRAM (TEE);  Surgeon: Delight Ovens, MD;  Location: [facility] OR;  Service: Open Heart Surgery;  Laterality: N/A;   TONSILLECTOMY       Current Outpatient Medications  Medication Sig Dispense Refill   acetaminophen (TYLENOL) 500 MG tablet Take 1 tablet (500 mg total) by mouth daily as needed for mild pain or headache. 30 tablet 0   albuterol (VENTOLIN HFA) 108 (90 Base) MCG/ACT inhaler  SMARTSIG:2 Puff(s) By Mouth Every 4-6 Hours PRN     apixaban (ELIQUIS) 5 MG TABS tablet Take 1 tablet (5 mg total) by mouth 2 (two) times daily. 60 tablet 2   azelastine (ASTELIN) 0.1 % nasal spray Place 2 sprays into both nostrils 2 (two) times daily. Use in each nostril as directed 30 mL 12   budesonide-formoterol (SYMBICORT) 80-4.5 MCG/ACT inhaler Inhale 2 puffs into the lungs 2 (two) times daily. 1 each 3   carvedilol (COREG) 25 MG tablet TAKE 1 TABLET BY MOUTH TWICE DAILY WITH MEALS 180 tablet 3   diltiazem (CARDIZEM CD) 180 MG 24 hr capsule Take 1 capsule (180 mg total) by mouth daily. 30 capsule 11   famotidine (PEPCID) 20 MG tablet Take 20 mg by mouth 2 (two) times daily.     Iron, Ferrous Sulfate, 325 (65 Fe) MG TABS Take 1 tablet by mouth daily with breakfast.     loratadine (CLARITIN) 10 MG tablet Take 10 mg by mouth daily.      losartan (COZAAR) 25 MG tablet Take 25 mg by mouth daily.     Multiple Vitamins-Minerals (PRESERVISION AREDS 2) CAPS Take 2 capsules by mouth daily.     oxybutynin (DITROPAN XL) 15 MG 24 hr tablet Take 1 tablet (15 mg total) by mouth daily. 30 tablet 11   rosuvastatin (CRESTOR) 20 MG tablet Take  1 tablet by mouth once daily 90 tablet 0   No current facility-administered medications for this visit.    Allergies:   Lisinopril, Ace inhibitors, and Baclofen    Social History:   reports that she has never smoked. She has never used smokeless tobacco. She reports that she does not drink alcohol and does not use drugs.   Family History:  family history includes Arthritis in her father and mother; Breast cancer in her maternal aunt; Heart disease in her father; Stroke in her father and son; Sudden Cardiac Death in her father.    ROS:     Review of Systems  Constitutional: Negative.   HENT: Negative.    Eyes: Negative.   Respiratory:  Positive for shortness of breath.   Cardiovascular: Negative.   Gastrointestinal: Negative.   Genitourinary: Negative.    Musculoskeletal: Negative.   Skin: Negative.   Neurological: Negative.   Endo/Heme/Allergies: Negative.   Psychiatric/Behavioral: Negative.    All other systems reviewed and are negative.   All other systems are reviewed and negative.   PHYSICAL EXAM: VS:  BP (!) 150/80   Pulse 62   Ht 5\' 9"  (1.753 m)   Wt 202 lb 3.2 oz (91.7 kg)   SpO2 98%   BMI 29.86 kg/m  , BMI Body mass index is 29.86 kg/m. Last weight:  Wt Readings from Last 3 Encounters:  05/16/22 202 lb 3.2 oz (91.7 kg)  05/09/22 201 lb 3.2 oz (91.3 kg)  04/16/22 198 lb (89.8 kg)   Physical Exam Constitutional:      Appearance: Normal appearance.  Cardiovascular:     Rate and Rhythm: Normal rate and regular rhythm.     Heart sounds: Normal heart sounds.  Pulmonary:     Effort: Pulmonary effort is normal.     Breath sounds: Normal breath sounds.  Musculoskeletal:     Right lower leg: No edema.     Left lower leg: No edema.  Neurological:     Mental Status: She is alert.     EKG: none today  Recent Labs: 10/29/2021: TSH 5.63 11/22/2021: Magnesium 1.9 01/10/2022: B Natriuretic Peptide 159.3 05/09/2022: ALT 19; BUN 28; Creat 1.08; Hemoglobin 12.7; Platelets 203; Potassium 4.2; Sodium 142    Lipid Panel    Component Value Date/Time   CHOL 118 05/09/2022 1513   CHOL 153 03/01/2012 1132   TRIG 140 05/09/2022 1513   TRIG 127 03/01/2012 1132   HDL 40 (L) 05/09/2022 1513   HDL 27 (L) 03/01/2012 1132   CHOLHDL 3.0 05/09/2022 1513   VLDL 19.0 01/11/2021 1001   VLDL 25 03/01/2012 1132   LDLCALC 56 05/09/2022 1513   LDLCALC 101 (H) 03/01/2012 1132   LDLDIRECT 64.0 05/17/2019 1058     Other studies Reviewed: Patient: [id number] - Audrey Wolf DOB:  1950-09-28  Date:  10/23/2020 11:00 Provider: Adrian Blackwater MD Encounter: ALL ANGIOGRAMS (CTA BRAIN, CAROTIDS, RENAL ARTERIES, PE)   Page 2 REASON FOR VISIT  Referred by Dr.Lorence Nagengast Wel[place] Flakes.   TESTS  Imaging: Computed Tomographic Angiography:  Cardiac  multidetector CT was performed paying particular attention to the coronary arteries for the diagnosis of: CAD. Diagnostic Drugs:  Administered iohexol (Omnipaque) through an antecubital vein and images from the examination were analyzed for the presence and extent of coronary artery disease, using 3D image processing software. 100 mL of non-ionic contrast (Omnipaque) was used.    TEST CONCLUSIONS  Quality of study: Suboptimal/Poor  1-Calcium score: 354.6  2-Right dominant system.  3-RCA appears normal, There appears to be no significant disease in the LAD/LCX territories. Extravasation of contrast none visible on CCTA.   Adrian Blackwater MD  Electronically signed by: Adrian Blackwater     Date: 10/26/2020 09:42  Patient: [id number] - Audrey Wolf DOB:  [birth year]  Date:  10/05/2020 08:00 Provider: Adrian Blackwater MD Encounter: NUCLEAR STRESS TEST   Page 1 TESTS   [facility] [address] [place], [place] [address] [phone] STUDY:  Gated Stress / Rest Myocardial Perfusion Imaging Tomographic (SPECT) Including attenuation correction Wall Motion, Left Ventricular Ejection Fraction By Gated Technique.Persantine Stress Test. SEX: Female  WEIGHT: 230 lbs  HEIGHT: 69 in    ARMS UP: YES/NO                                                                                                                                                                                REFERRING PHYSICIAN: Dr.Catheline Hixon Wel[place] Flakes                                                                                                                                                                                                                       INDICATION FOR STUDY: CP  TECHNIQUE:  Approximately 20 minutes  following the intravenous administration of 10.8 mCi of Tc-65m Sestamibi after stress testing in a reclined supine position with arms above their head if able to do so, gated SPECT imaging of the heart was performed. After about a 2hr break, the patient was injected intravenously with 32.6 mCi of Tc-44m Sestamibi.  Approximately 45 minutes later in the same position as stress imaging SPECT rest imaging of the heart was performed.  STRESS BY:  Adrian Blackwater, MD PROTOCOL:  Persantine   DOSE ADMIN: 10 cc  ROUTE OF ADMINISTRATION: IV                                                                            MAX PRED HR: 150                     85%: 128               75%: 113                                                                                                                   RESTING BP: 124/70   RESTING HR: 54  PEAK BP: 122/70  PEAK HR: 59                                                                    EXERCISE DURATION:    4 min injection                                            REASON FOR TEST TERMINATION:    Protocol end                                                                                                                              SYMPTOMS:  None                                                                                                                                                                                                          EKG RESULTS: Sinus bradycardia. 54/min. Non specific ST/T wave changes, no significant changes at peak exercise.                                                             IMAGE QUALITY: Good  PERFUSION/WALL MOTION  FINDINGS: EF = 81%. No perfusion defects, normal wall motion.                                                                           IMPRESSION: Normal stress test with normal LVEF.                                                                                                                                                                                                                                                                                         Adrian Blackwater, MD Stress Interpreting Physician / Nuclear Interpreting Physician        Adrian Blackwater MD  Electronically signed by: Adrian Blackwater     Date: 10/11/2020 14:22  Patient: [id number] - Audrey Wolf DOB:  23-May-1950  Date:  05/28/2021 10:00 Provider: Adrian Blackwater MD Encounter: ECHO   Page 2 REASON FOR VISIT  Visit for: Echocardiogram/I 34.0  Sex:   Female   wt= 224   lbs.  BP=132/74  Height= 69   inches.   TESTS  Imaging: Echocardiogram:  An echocardiogram in (2-d) mode was performed and in Doppler mode with color flow velocity mapping was performed. The aortic valve cusps are abnormal 1.0    cm, flow velocity 1.14   m/s, and systolic calculated mean flow gradient 3   mmHg. Mitral valve diastolic peak flow velocity E 1.3    m/s and E/A ratio 1.5. Aortic root diameter 3.3   cm. The LVOT internal diameter 3.3 cm and flow velocity was abnormal .97    m/s. LV systolic dimension 3.16  cm, diastolic 5.98   cm, posterior wall thickness 1.45    cm, fractional shortening 47.2  %, and EF 77.8  %. IVS thickness 1.21   cm. LA dimension 4.9 cm. Mitral Valve has Mild to Moderate Regurgitation. Pulmonic Valve has  Trace Regurgitation. Tricuspid Valve has Trace Regurgitation.     ASSESSMENT  Technically adequate study.  Normal chamber sizes.  Normal left ventricular systolic function.  Mild left ventricular hypertrophy with GRADE 2 (psuedonormalization ) diastolic dysfunction.  Normal right ventricular systolic function.   Normal right ventricular diastolic function.  Normal left ventricular wall motion.  Normal right ventricular wall motion.  Trace pulmonary regurgitation.  Trace tricuspid regurgitation.  Normal pulmonary artery pressure.  Mild to Moderate mitral regurgitation.  No Pericardial effusion  Moderate LVH.   THERAPY   Referring physician: Laurier Nancy  Sonographer:Jaret Coppedge Wel[place] Flakes.   Adrian Blackwater MD  Electronically signed by: Adrian Blackwater     Date: 05/30/2021 11:22   ASSESSMENT AND PLAN:    ICD-10-CM   1. Hypertension secondary to other renal disorders  I15.1     2. Coronary artery disease involving native coronary artery of native heart without angina pectoris  I25.10     3. Chronic diastolic CHF (congestive heart failure)  I50.32     4. Paroxysmal atrial fibrillation  I48.0     5. Mixed hyperlipidemia  E78.2        Problem List Items Addressed This Visit       Cardiovascular and Mediastinum   Hypertension - Primary (Chronic)    Patient's b/p elevated today. Patient reports b/p systolic 130s at home. B/p 114/68 at PCP's office on 05/09/22. Will continue same medications. Keep monitoring at home. Return in 2 weeks for b/p check.       Paroxysmal atrial fibrillation (Chronic)   CAD (coronary artery disease), native coronary artery   Chronic diastolic CHF (congestive heart failure)     Other   Hyperlipidemia     Disposition:   Return in about 2 weeks (around 05/30/2022) for blood pressure check.    Total time spent: 30 minutes  Signed,  Adrian Blackwater, MD  05/16/2022 11:35 AM    [facility]

## 2022-05-16 NOTE — Assessment & Plan Note (Signed)
Patient's b/p elevated today. Patient reports b/p systolic 130s at home. B/p 114/68 at PCP's office on 05/09/22. Will continue same medications. Keep monitoring at home. Return in 2 weeks for b/p check.

## 2022-05-20 ENCOUNTER — Telehealth: Payer: Self-pay

## 2022-05-20 NOTE — Telephone Encounter (Signed)
There is several same day appointments available for tomorrow

## 2022-05-20 NOTE — Telephone Encounter (Signed)
Spoke to Patient she states she has an appointment with Dr. Birdie Sons in the morning at 10:45. Patient states her BP is down to 131/60 and she is not having any of those symptoms. Patient understands if her BP goes back up or she develops symptoms to call 911 or go to the ED immediately.

## 2022-05-20 NOTE — Telephone Encounter (Signed)
Patient states she had a headache Sunday but she was working so it was tolerable.  Patient states she took her blood pressure when she got home and it was 191/82.    Patient states her blood pressure was 205/84 yesterday.  Patient states her blood pressure was 200/86 this morning.  I transferred call to Access Nurse.

## 2022-05-21 ENCOUNTER — Other Ambulatory Visit: Payer: Self-pay | Admitting: Family Medicine

## 2022-05-21 ENCOUNTER — Encounter: Payer: Self-pay | Admitting: Family Medicine

## 2022-05-21 ENCOUNTER — Telehealth: Payer: Self-pay | Admitting: Family Medicine

## 2022-05-21 ENCOUNTER — Ambulatory Visit (INDEPENDENT_AMBULATORY_CARE_PROVIDER_SITE_OTHER): Payer: BC Managed Care – PPO | Admitting: Family Medicine

## 2022-05-21 VITALS — BP 134/76 | HR 83 | Temp 98.5°F | Ht 69.0 in | Wt 203.0 lb

## 2022-05-21 DIAGNOSIS — I151 Hypertension secondary to other renal disorders: Secondary | ICD-10-CM

## 2022-05-21 DIAGNOSIS — R0989 Other specified symptoms and signs involving the circulatory and respiratory systems: Secondary | ICD-10-CM

## 2022-05-21 LAB — BASIC METABOLIC PANEL
BUN: 35 mg/dL — ABNORMAL HIGH (ref 6–23)
CO2: 22 mEq/L (ref 19–32)
Calcium: 8.8 mg/dL (ref 8.4–10.5)
Chloride: 109 mEq/L (ref 96–112)
Creatinine, Ser: 1.36 mg/dL — ABNORMAL HIGH (ref 0.40–1.20)
GFR: 38.95 mL/min — ABNORMAL LOW (ref >60.00)
Glucose, Bld: 94 mg/dL (ref 70–99)
Potassium: 3.9 mEq/L (ref 3.5–5.1)
Sodium: 143 mEq/L (ref 135–145)

## 2022-05-21 MED ORDER — LOSARTAN POTASSIUM 50 MG PO TABS: 50.0000 mg | ORAL_TABLET | Freq: Every day | ORAL | 1 refills | Status: DC

## 2022-05-21 NOTE — Progress Notes (Addendum)
Audrey Alar, MD Phone: [phone]  Audrey Wolf is a 72 y.o. female who presents today for same day visit.   HYPERTENSION Disease Monitoring Home BP Monitoring 131-207/61-90 Chest pain- no    Dyspnea- no Medications Compliance-  taking coreg, diltiazem, losartan.   Edema- no Headache 3 days ago when BP went up though this resolved with sleep and has not recurred. Has not missed any medications.  BMET    Component Value Date/Time   NA 142 05/09/2022 1513   NA 144 08/19/2019 1533   NA 140 03/01/2012 1132   K 4.2 05/09/2022 1513   K 3.5 03/01/2012 1132   CL 110 05/09/2022 1513   CL 108 (H) 03/01/2012 1132   CO2 24 05/09/2022 1513   CO2 23 03/01/2012 1132   GLUCOSE 94 05/09/2022 1513   GLUCOSE 92 03/01/2012 1132   BUN 28 (H) 05/09/2022 1513   BUN 20 08/19/2019 1533   BUN 22 (H) 03/01/2012 1132   CREATININE 1.08 (H) 05/09/2022 1513   CALCIUM 8.9 05/09/2022 1513   CALCIUM 8.6 03/01/2012 1132   GFRNONAA 57 (L) 01/10/2022 1040   GFRNONAA >60 03/01/2012 1132   GFRAA 67 08/19/2019 1533   GFRAA >60 03/01/2012 1132     Social History   Tobacco Use  Smoking Status Never  Smokeless Tobacco Never    Current Outpatient Medications on File Prior to Visit  Medication Sig Dispense Refill   acetaminophen (TYLENOL) 500 MG tablet Take 1 tablet (500 mg total) by mouth daily as needed for mild pain or headache. 30 tablet 0   albuterol (VENTOLIN HFA) 108 (90 Base) MCG/ACT inhaler SMARTSIG:2 Puff(s) By Mouth Every 4-6 Hours PRN     azelastine (ASTELIN) 0.1 % nasal spray Place 2 sprays into both nostrils 2 (two) times daily. Use in each nostril as directed 30 mL 12   budesonide-formoterol (SYMBICORT) 80-4.5 MCG/ACT inhaler Inhale 2 puffs into the lungs 2 (two) times daily. 1 each 3   carvedilol (COREG) 25 MG tablet TAKE 1 TABLET BY MOUTH TWICE DAILY WITH MEALS 180 tablet 3   diltiazem (CARDIZEM CD) 180 MG 24 hr capsule Take 1 capsule (180 mg total) by mouth daily. 30 capsule 11    famotidine (PEPCID) 20 MG tablet Take 20 mg by mouth 2 (two) times daily.     Iron, Ferrous Sulfate, 325 (65 Fe) MG TABS Take 1 tablet by mouth daily with breakfast.     loratadine (CLARITIN) 10 MG tablet Take 10 mg by mouth daily.      losartan (COZAAR) 25 MG tablet Take 25 mg by mouth daily.     Multiple Vitamins-Minerals (PRESERVISION AREDS 2) CAPS Take 2 capsules by mouth daily.     oxybutynin (DITROPAN XL) 15 MG 24 hr tablet Take 1 tablet (15 mg total) by mouth daily. 30 tablet 11   rosuvastatin (CRESTOR) 20 MG tablet Take 1 tablet by mouth once daily 90 tablet 0   apixaban (ELIQUIS) 5 MG TABS tablet Take 1 tablet (5 mg total) by mouth 2 (two) times daily. 60 tablet 2   No current facility-administered medications on file prior to visit.     ROS see history of present illness  Objective  Physical Exam Vitals:   05/21/22 1015 05/21/22 1030  BP: 134/82 134/76  Pulse: 83   Temp: 98.5 F (36.9 C)   SpO2: 96%     BP Readings from Last 3 Encounters:  05/21/22 134/76  05/16/22 (!) 150/80  05/09/22 114/68   Wt  Readings from Last 3 Encounters:  05/21/22 203 lb (92.1 kg)  05/16/22 202 lb 3.2 oz (91.7 kg)  05/09/22 201 lb 3.2 oz (91.3 kg)    Physical Exam Constitutional:      General: She is not in acute distress.    Appearance: She is not diaphoretic.  Cardiovascular:     Rate and Rhythm: Normal rate and regular rhythm.     Heart sounds: Normal heart sounds.  Pulmonary:     Effort: Pulmonary effort is normal.     Breath sounds: Normal breath sounds.  Skin:    General: Skin is warm and dry.  Neurological:     Mental Status: She is alert.      Assessment/Plan: Please see individual problem list.  Hypertension secondary to other renal disorders Assessment & Plan: Chronic issue with exacerbation.  Blood pressure improved in the office today though has been elevated recently.  I am going to send a message to her nephrologist and her cardiologist to get their  input.  Discussed the potential for increasing her losartan versus evaluating for renal artery stenosis or other underlying cause.  For now she will continue losartan 25 mg daily, carvedilol 25 mg twice daily, and diltiazem 180 mg daily.  Patient was advised to seek medical attention in the emergency department if she develops chest pain, shortness of breath, or stroke symptoms.  Orders: -     Basic metabolic panel    Return in about 1 month (around 06/20/2022) for Hypertension.  Addendum: message received from Dr Cherylann Ratel on 05/21/22 : "Hey Dr. Birdie Sons,   Appreciate the heads up on Audrey Wolf.  She has had BP spikes like this before in the past.  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   Agree with the losartan increase along with plans for renal artery duplex given the acute rise."  Patient with labile hypertension concerning for a secondary hypertension and possible issue with renal artery stenosis. 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 Patient has been referred to vascular surgery for this.   Audrey Alar, MD [facility]

## 2022-05-21 NOTE — Telephone Encounter (Signed)
-----   Message from [name], MD sent at 05/21/2022 10:36 AM EDT ----- Regarding: RE: HTN Hey Dr. Birdie Sons,  Appreciate the heads up on Mrs. Burruel.  She has had BP spikes like this before in the past.  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   Agree with the losartan increase along with plans for renal artery duplex given the acute rise. ----- Message ----- From: Glori Luis, MD Sent: 05/21/2022  10:33 AM EDT To: [name], MD Subject: HTN                                            Hi Dr Cherylann Ratel,   I saw Mrs Dormer for her BP today. She had a fairly sudden spike in her BP over the past 3-4 days with it getting up to the 200 range systolic. In the office today it is generally well controlled for her at 134/82. I wanted to make sure we were on the same page with this for her. I was planning on increasing her losartan to 50 mg daily and wondered if checking for renal artery stenosis would be a reasonable next step given the significant lability of her BP. Please let me know your thoughts.   [name]

## 2022-05-21 NOTE — Assessment & Plan Note (Addendum)
Chronic issue with exacerbation.  Blood pressure improved in the office today though has been elevated recently.  I am going to send a message to her nephrologist and her cardiologist to get their input.  Discussed the potential for increasing her losartan versus evaluating for renal artery stenosis or other underlying cause.  For now she will continue losartan 25 mg daily, carvedilol 25 mg twice daily, and diltiazem 180 mg daily.  Patient was advised to seek medical attention in the emergency department if she develops chest pain, shortness of breath, or stroke symptoms.

## 2022-05-21 NOTE — Telephone Encounter (Signed)
Please let the patient know that I heard back from Dr. Cherylann Ratel.  He recommended increasing the losartan 50 mg daily and getting an ultrasound to check the blood flow in her kidneys.  I have placed orders for these.  She will need a BMP in 1 week to follow-up on the increased dose of losartan.

## 2022-05-21 NOTE — Telephone Encounter (Signed)
Left message to call back and sent a my chart message

## 2022-05-22 ENCOUNTER — Telehealth: Payer: Self-pay | Admitting: Family Medicine

## 2022-05-22 DIAGNOSIS — I151 Hypertension secondary to other renal disorders: Secondary | ICD-10-CM

## 2022-05-22 NOTE — Telephone Encounter (Signed)
Patient has a Lab appointment 05/28/2022, there are no orders in. 

## 2022-05-23 NOTE — Addendum Note (Signed)
Addended by: Birdie Sons, Huckleberry Martinson G on: 05/23/2022 10:08 AM   Modules accepted: Orders

## 2022-05-27 ENCOUNTER — Ambulatory Visit
Admission: RE | Admit: 2022-05-27 | Discharge: 2022-05-27 | Disposition: A | Discharge status: Home / Self Care | Payer: BC Managed Care – PPO | Source: Ambulatory Visit | Attending: Family Medicine | Admitting: Family Medicine

## 2022-05-27 DIAGNOSIS — I151 Hypertension secondary to other renal disorders: Secondary | ICD-10-CM | POA: Insufficient documentation

## 2022-05-27 DIAGNOSIS — R0989 Other specified symptoms and signs involving the circulatory and respiratory systems: Secondary | ICD-10-CM | POA: Insufficient documentation

## 2022-05-27 DIAGNOSIS — N1330 Unspecified hydronephrosis: Secondary | ICD-10-CM | POA: Insufficient documentation

## 2022-05-28 ENCOUNTER — Other Ambulatory Visit (INDEPENDENT_AMBULATORY_CARE_PROVIDER_SITE_OTHER): Payer: BC Managed Care – PPO

## 2022-05-28 DIAGNOSIS — I151 Hypertension secondary to other renal disorders: Secondary | ICD-10-CM | POA: Diagnosis not present

## 2022-05-28 LAB — BASIC METABOLIC PANEL WITH GFR
BUN: 38 mg/dL — ABNORMAL HIGH (ref 6–23)
CO2: 25 meq/L (ref 19–32)
Calcium: 8.8 mg/dL (ref 8.4–10.5)
Chloride: 109 meq/L (ref 96–112)
Creatinine, Ser: 1.3 mg/dL — ABNORMAL HIGH (ref 0.40–1.20)
GFR: 41.11 mL/min — ABNORMAL LOW
Glucose, Bld: 87 mg/dL (ref 70–99)
Potassium: 4.2 meq/L (ref 3.5–5.1)
Sodium: 142 meq/L (ref 135–145)

## 2022-05-29 ENCOUNTER — Telehealth: Payer: Self-pay

## 2022-05-29 DIAGNOSIS — I701 Atherosclerosis of renal artery: Secondary | ICD-10-CM

## 2022-05-29 NOTE — Telephone Encounter (Signed)
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  I would like to refer her to vascular surgery to have them evaluate this further.  I can place this referral once you speak with her.  FYI to Dr. Cherylann Ratel

## 2022-05-29 NOTE — Telephone Encounter (Signed)
Pt needs results of both:  Please let the patient know that her kidney function is a little worse than it has been recently. We can see what this is in one week when we recheck it after increasing her losartan dose.  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 I would like to refer her to vascular surgery to have them evaluate this further. I can place this referral once you speak with her.

## 2022-05-29 NOTE — Telephone Encounter (Signed)
LMTCB

## 2022-05-29 NOTE — Telephone Encounter (Signed)
Pt returned [employer]. Unable to transfer. Note below was read to pt. Pt aware. Pt still would like to f/u with a nurse. Pt also mentioned to yes go and place the referral for vascular.

## 2022-05-30 ENCOUNTER — Ambulatory Visit (INDEPENDENT_AMBULATORY_CARE_PROVIDER_SITE_OTHER): Payer: BC Managed Care – PPO | Admitting: Cardiovascular Disease

## 2022-05-30 ENCOUNTER — Encounter: Payer: Self-pay | Admitting: Cardiovascular Disease

## 2022-05-30 VITALS — BP 162/86 | HR 57 | Ht 69.0 in | Wt 202.8 lb

## 2022-05-30 DIAGNOSIS — I5032 Chronic diastolic (congestive) heart failure: Secondary | ICD-10-CM | POA: Diagnosis not present

## 2022-05-30 DIAGNOSIS — I480 Paroxysmal atrial fibrillation: Secondary | ICD-10-CM | POA: Diagnosis not present

## 2022-05-30 DIAGNOSIS — I151 Hypertension secondary to other renal disorders: Secondary | ICD-10-CM

## 2022-05-30 DIAGNOSIS — I701 Atherosclerosis of renal artery: Secondary | ICD-10-CM

## 2022-05-30 DIAGNOSIS — I2510 Atherosclerotic heart disease of native coronary artery without angina pectoris: Secondary | ICD-10-CM

## 2022-05-30 MED ORDER — HYDRALAZINE HCL 25 MG PO TABS: 25.0000 mg | ORAL_TABLET | Freq: Three times a day (TID) | ORAL | 2 refills | Status: DC

## 2022-05-30 NOTE — Progress Notes (Signed)
Cardiology Office Note   Date:  05/30/2022   ID:  Audrey Wolf, Audrey Wolf 1950-10-20, MRN [id number]  PCP:  Glori Luis, MD  Cardiologist:  Adrian Blackwater, MD      History of Present Illness: Audrey Wolf is a 72 y.o. female who presents for  Chief Complaint  Patient presents with   Follow-up    2 week follow up, BP check.    Has no complaints      Past Medical History:  Diagnosis Date   Arthritis    Asthma    CHF (congestive heart failure) (HCC)    Chronic kidney disease    Coronary artery disease    GERD (gastroesophageal reflux disease)    Headache    Hypertension    Nephrolithiasis    NSTEMI (non-ST elevated myocardial infarction) (HCC)    Postprandial RUQ pain 05/25/2019     Past Surgical History:  Procedure Laterality Date   ABDOMINAL HYSTERECTOMY     APPENDECTOMY     BREAST BIOPSY Left 12/31/2010   neg   CORONARY ARTERY BYPASS GRAFT N/A 05/22/2017   Procedure: CORONARY ARTERY BYPASS GRAFTING (CABG) x 2 WITH ENDOSCOPIC HARVESTING OF RIGHT SAPHENOUS VEIN;  Surgeon: Delight Ovens, MD;  Location: [facility] OR;  Service: Open Heart Surgery;  Laterality: N/A;   LEFT HEART CATH AND CORONARY ANGIOGRAPHY N/A 05/18/2017   Procedure: LEFT HEART CATH AND CORONARY ANGIOGRAPHY;  Surgeon: Laurier Nancy, MD;  Location: [facility] INVASIVE CV LAB;  Service: Cardiovascular;  Laterality: N/A;   LITHOTRIPSY     TEE WITHOUT CARDIOVERSION N/A 05/22/2017   Procedure: TRANSESOPHAGEAL ECHOCARDIOGRAM (TEE);  Surgeon: Delight Ovens, MD;  Location: [facility] OR;  Service: Open Heart Surgery;  Laterality: N/A;   TONSILLECTOMY       Current Outpatient Medications  Medication Sig Dispense Refill   acetaminophen (TYLENOL) 500 MG tablet Take 1 tablet (500 mg total) by mouth daily as needed for mild pain or headache. 30 tablet 0   albuterol (VENTOLIN HFA) 108 (90 Base) MCG/ACT inhaler SMARTSIG:2 Puff(s) By Mouth Every 4-6 Hours PRN     apixaban (ELIQUIS) 5 MG TABS tablet Take 1  tablet (5 mg total) by mouth 2 (two) times daily. 60 tablet 2   azelastine (ASTELIN) 0.1 % nasal spray Place 2 sprays into both nostrils 2 (two) times daily. Use in each nostril as directed 30 mL 12   budesonide-formoterol (SYMBICORT) 80-4.5 MCG/ACT inhaler Inhale 2 puffs into the lungs 2 (two) times daily. 1 each 3   carvedilol (COREG) 25 MG tablet TAKE 1 TABLET BY MOUTH TWICE DAILY WITH MEALS 180 tablet 3   diltiazem (CARDIZEM CD) 180 MG 24 hr capsule Take 1 capsule (180 mg total) by mouth daily. 30 capsule 11   famotidine (PEPCID) 20 MG tablet Take 20 mg by mouth 2 (two) times daily.     hydrALAZINE (APRESOLINE) 25 MG tablet Take 1 tablet (25 mg total) by mouth 3 (three) times daily. 120 tablet 2   Iron, Ferrous Sulfate, 325 (65 Fe) MG TABS Take 1 tablet by mouth daily with breakfast.     loratadine (CLARITIN) 10 MG tablet Take 10 mg by mouth daily.      losartan (COZAAR) 50 MG tablet Take 1 tablet (50 mg total) by mouth daily. 90 tablet 1   Multiple Vitamins-Minerals (PRESERVISION AREDS 2) CAPS Take 2 capsules by mouth daily.     oxybutynin (DITROPAN XL) 15 MG 24 hr tablet Take 1 tablet (15 mg  total) by mouth daily. 30 tablet 11   rosuvastatin (CRESTOR) 20 MG tablet Take 1 tablet by mouth once daily 90 tablet 3   No current facility-administered medications for this visit.    Allergies:   Lisinopril, Ace inhibitors, and Baclofen    Social History:   reports that she has never smoked. She has never used smokeless tobacco. She reports that she does not drink alcohol and does not use drugs.   Family History:  family history includes Arthritis in her father and mother; Breast cancer in her maternal aunt; Heart disease in her father; Stroke in her father and son; Sudden Cardiac Death in her father.    ROS:     Review of Systems  Constitutional: Negative.   HENT: Negative.    Eyes: Negative.   Respiratory: Negative.    Gastrointestinal: Negative.   Genitourinary: Negative.    Musculoskeletal: Negative.   Skin: Negative.   Neurological: Negative.   Endo/Heme/Allergies: Negative.   Psychiatric/Behavioral: Negative.    All other systems reviewed and are negative.     All other systems are reviewed and negative.    PHYSICAL EXAM: VS:  BP (!) 162/86   Pulse (!) 57   Ht 5\' 9"  (1.753 m)   Wt 202 lb 12.8 oz (92 kg)   SpO2 98%   BMI 29.95 kg/m  , BMI Body mass index is 29.95 kg/m. Last weight:  Wt Readings from Last 3 Encounters:  05/30/22 202 lb 12.8 oz (92 kg)  05/21/22 203 lb (92.1 kg)  05/16/22 202 lb 3.2 oz (91.7 kg)     Physical Exam Constitutional:      Appearance: Normal appearance.  Cardiovascular:     Rate and Rhythm: Normal rate and regular rhythm.     Heart sounds: Normal heart sounds.  Pulmonary:     Effort: Pulmonary effort is normal.     Breath sounds: Normal breath sounds.  Musculoskeletal:     Right lower leg: No edema.     Left lower leg: No edema.  Neurological:     Mental Status: She is alert.       EKG:   Recent Labs: 10/29/2021: TSH 5.63 11/22/2021: Magnesium 1.9 01/10/2022: B Natriuretic Peptide 159.3 05/09/2022: ALT 19; Hemoglobin 12.7; Platelets 203 05/28/2022: BUN 38; Creatinine, Ser 1.30; Potassium 4.2; Sodium 142    Lipid Panel    Component Value Date/Time   CHOL 118 05/09/2022 1513   CHOL 153 03/01/2012 1132   TRIG 140 05/09/2022 1513   TRIG 127 03/01/2012 1132   HDL 40 (L) 05/09/2022 1513   HDL 27 (L) 03/01/2012 1132   CHOLHDL 3.0 05/09/2022 1513   VLDL 19.0 01/11/2021 1001   VLDL 25 03/01/2012 1132   LDLCALC 56 05/09/2022 1513   LDLCALC 101 (H) 03/01/2012 1132   LDLDIRECT 64.0 05/17/2019 1058      Other studies Reviewed: Additional studies/ records that were reviewed today include:  Review of the above records demonstrates:       No data to display            ASSESSMENT AND PLAN:    ICD-10-CM   1. Renal artery stenosis (HCC)  I70.1 CT ANGIO AO+BIFEM W & OR WO CONTRAST    ultrasound showed high velocities left renal artery, advise cta aorta prior to renal consult    2. Coronary artery disease involving native coronary artery of native heart without angina pectoris  I25.10 CT ANGIO AO+BIFEM W & OR WO CONTRAST  3. Chronic diastolic CHF (congestive heart failure) (HCC)  I50.32 CT ANGIO AO+BIFEM W & OR WO CONTRAST    4. Hypertension secondary to other renal disorders  I15.1 CT ANGIO AO+BIFEM W & OR WO CONTRAST   add hydralzine 25 tid    5. Paroxysmal atrial fibrillation (HCC)  I48.0 CT ANGIO AO+BIFEM W & OR WO CONTRAST       Problem List Items Addressed This Visit       Cardiovascular and Mediastinum   Hypertension (Chronic)   Relevant Medications   hydrALAZINE (APRESOLINE) 25 MG tablet   Other Relevant Orders   CT ANGIO AO+BIFEM W & OR WO CONTRAST   Paroxysmal atrial fibrillation (HCC) (Chronic)   Relevant Medications   hydrALAZINE (APRESOLINE) 25 MG tablet   Other Relevant Orders   CT ANGIO AO+BIFEM W & OR WO CONTRAST   CAD (coronary artery disease), native coronary artery   Relevant Medications   hydrALAZINE (APRESOLINE) 25 MG tablet   Other Relevant Orders   CT ANGIO AO+BIFEM W & OR WO CONTRAST   Chronic diastolic CHF (congestive heart failure) (HCC)   Relevant Medications   hydrALAZINE (APRESOLINE) 25 MG tablet   Other Relevant Orders   CT ANGIO AO+BIFEM W & OR WO CONTRAST   Other Visit Diagnoses     Renal artery stenosis (HCC)    -  Primary   ultrasound showed high velocities left renal artery, advise cta aorta prior to renal consult   Relevant Medications   hydrALAZINE (APRESOLINE) 25 MG tablet   Other Relevant Orders   CT ANGIO AO+BIFEM W & OR WO CONTRAST          Disposition:   Return for cta aorta and f/u.    Total time spent: 30 minutes  Signed,  Adrian Blackwater, MD  05/30/2022 9:39 AM    [facility]

## 2022-05-30 NOTE — Telephone Encounter (Signed)
Pt would like the referral placed for a vascular doctor.

## 2022-06-02 DIAGNOSIS — D631 Anemia in chronic kidney disease: Secondary | ICD-10-CM | POA: Diagnosis not present

## 2022-06-02 DIAGNOSIS — I10 Essential (primary) hypertension: Secondary | ICD-10-CM | POA: Diagnosis not present

## 2022-06-02 DIAGNOSIS — N2581 Secondary hyperparathyroidism of renal origin: Secondary | ICD-10-CM | POA: Diagnosis not present

## 2022-06-02 DIAGNOSIS — I701 Atherosclerosis of renal artery: Secondary | ICD-10-CM | POA: Diagnosis not present

## 2022-06-02 DIAGNOSIS — N1832 Chronic kidney disease, stage 3b: Secondary | ICD-10-CM | POA: Diagnosis not present

## 2022-06-02 NOTE — Telephone Encounter (Signed)
Referral placed.

## 2022-06-02 NOTE — Addendum Note (Signed)
Addended by: Glori Luis on: 06/02/2022 11:38 AM   Modules accepted: Orders

## 2022-06-10 ENCOUNTER — Ambulatory Visit (INDEPENDENT_AMBULATORY_CARE_PROVIDER_SITE_OTHER): Payer: BC Managed Care – PPO

## 2022-06-10 ENCOUNTER — Other Ambulatory Visit: Payer: Self-pay | Admitting: Cardiovascular Disease

## 2022-06-10 DIAGNOSIS — I5032 Chronic diastolic (congestive) heart failure: Secondary | ICD-10-CM | POA: Diagnosis not present

## 2022-06-10 DIAGNOSIS — I2510 Atherosclerotic heart disease of native coronary artery without angina pectoris: Secondary | ICD-10-CM | POA: Diagnosis not present

## 2022-06-10 DIAGNOSIS — I701 Atherosclerosis of renal artery: Secondary | ICD-10-CM

## 2022-06-10 DIAGNOSIS — I151 Hypertension secondary to other renal disorders: Secondary | ICD-10-CM | POA: Diagnosis not present

## 2022-06-10 DIAGNOSIS — I480 Paroxysmal atrial fibrillation: Secondary | ICD-10-CM

## 2022-06-10 MED ORDER — IOHEXOL 350 MG/ML SOLN
100.0000 mL | Freq: Once | INTRAVENOUS | Status: AC | PRN
  Administered 2022-06-10: 100 mL via INTRAVENOUS

## 2022-06-27 ENCOUNTER — Ambulatory Visit (INDEPENDENT_AMBULATORY_CARE_PROVIDER_SITE_OTHER): Payer: BC Managed Care – PPO | Admitting: Cardiovascular Disease

## 2022-06-27 ENCOUNTER — Encounter: Payer: Self-pay | Admitting: Cardiovascular Disease

## 2022-06-27 VITALS — BP 152/64 | HR 65 | Ht 69.0 in | Wt 204.0 lb

## 2022-06-27 DIAGNOSIS — N131 Hydronephrosis with ureteral stricture, not elsewhere classified: Secondary | ICD-10-CM

## 2022-06-27 DIAGNOSIS — I480 Paroxysmal atrial fibrillation: Secondary | ICD-10-CM | POA: Diagnosis not present

## 2022-06-27 DIAGNOSIS — I701 Atherosclerosis of renal artery: Secondary | ICD-10-CM

## 2022-06-27 DIAGNOSIS — I10 Essential (primary) hypertension: Secondary | ICD-10-CM

## 2022-06-27 DIAGNOSIS — R0609 Other forms of dyspnea: Secondary | ICD-10-CM

## 2022-06-27 DIAGNOSIS — N183 Chronic kidney disease, stage 3 unspecified: Secondary | ICD-10-CM | POA: Diagnosis not present

## 2022-06-27 DIAGNOSIS — I2510 Atherosclerotic heart disease of native coronary artery without angina pectoris: Secondary | ICD-10-CM

## 2022-06-27 DIAGNOSIS — Z951 Presence of aortocoronary bypass graft: Secondary | ICD-10-CM | POA: Diagnosis not present

## 2022-06-27 DIAGNOSIS — N270 Small kidney, unilateral: Secondary | ICD-10-CM

## 2022-06-27 MED ORDER — HYDRALAZINE HCL 25 MG PO TABS: 25.0000 mg | ORAL_TABLET | Freq: Four times a day (QID) | ORAL | 11 refills | Status: DC

## 2022-06-27 NOTE — Assessment & Plan Note (Signed)
Do stress test as has DOE

## 2022-06-27 NOTE — Assessment & Plan Note (Signed)
Had CABG 3 years back, advise stress tesst as DOE

## 2022-06-27 NOTE — Progress Notes (Signed)
Cardiology Office Note   Date:  06/27/2022   ID:  Audrey Wolf, DOB 09-01-1950, MRN [id number]  PCP:  Glori Luis, MD  Cardiologist:  Adrian Blackwater, MD      History of Present Illness: Audrey Wolf is a 72 y.o. female who presents for  Chief Complaint  Patient presents with   Follow-up    CTA Aorta Follow Up    Feels tired and occasional SOB  Shortness of Breath This is a chronic problem. The current episode started more than 1 year ago. The problem occurs daily. The problem has been unchanged. Pertinent negatives include no chest pain, headaches, orthopnea or PND.      Past Medical History:  Diagnosis Date   Arthritis    Asthma    CHF (congestive heart failure) (HCC)    Chronic kidney disease    Coronary artery disease    GERD (gastroesophageal reflux disease)    Headache    Hypertension    Nephrolithiasis    NSTEMI (non-ST elevated myocardial infarction) (HCC)    Postprandial RUQ pain 05/25/2019     Past Surgical History:  Procedure Laterality Date   ABDOMINAL HYSTERECTOMY     APPENDECTOMY     BREAST BIOPSY Left 12/31/2010   neg   CORONARY ARTERY BYPASS GRAFT N/A 05/22/2017   Procedure: CORONARY ARTERY BYPASS GRAFTING (CABG) x 2 WITH ENDOSCOPIC HARVESTING OF RIGHT SAPHENOUS VEIN;  Surgeon: Delight Ovens, MD;  Location: [facility] OR;  Service: Open Heart Surgery;  Laterality: N/A;   LEFT HEART CATH AND CORONARY ANGIOGRAPHY N/A 05/18/2017   Procedure: LEFT HEART CATH AND CORONARY ANGIOGRAPHY;  Surgeon: Laurier Nancy, MD;  Location: [facility] INVASIVE CV LAB;  Service: Cardiovascular;  Laterality: N/A;   LITHOTRIPSY     TEE WITHOUT CARDIOVERSION N/A 05/22/2017   Procedure: TRANSESOPHAGEAL ECHOCARDIOGRAM (TEE);  Surgeon: Delight Ovens, MD;  Location: [facility] OR;  Service: Open Heart Surgery;  Laterality: N/A;   TONSILLECTOMY       Current Outpatient Medications  Medication Sig Dispense Refill   hydrALAZINE (APRESOLINE) 25 MG tablet Take 1  tablet (25 mg total) by mouth 4 (four) times daily. 120 tablet 11   acetaminophen (TYLENOL) 500 MG tablet Take 1 tablet (500 mg total) by mouth daily as needed for mild pain or headache. 30 tablet 0   albuterol (VENTOLIN HFA) 108 (90 Base) MCG/ACT inhaler SMARTSIG:2 Puff(s) By Mouth Every 4-6 Hours PRN     apixaban (ELIQUIS) 5 MG TABS tablet Take 1 tablet (5 mg total) by mouth 2 (two) times daily. 60 tablet 2   azelastine (ASTELIN) 0.1 % nasal spray Place 2 sprays into both nostrils 2 (two) times daily. Use in each nostril as directed 30 mL 12   budesonide-formoterol (SYMBICORT) 80-4.5 MCG/ACT inhaler Inhale 2 puffs into the lungs 2 (two) times daily. 1 each 3   carvedilol (COREG) 25 MG tablet TAKE 1 TABLET BY MOUTH TWICE DAILY WITH MEALS 180 tablet 3   diltiazem (CARDIZEM CD) 180 MG 24 hr capsule Take 1 capsule (180 mg total) by mouth daily. 30 capsule 11   famotidine (PEPCID) 20 MG tablet Take 20 mg by mouth 2 (two) times daily.     Iron, Ferrous Sulfate, 325 (65 Fe) MG TABS Take 1 tablet by mouth daily with breakfast.     loratadine (CLARITIN) 10 MG tablet Take 10 mg by mouth daily.      losartan (COZAAR) 50 MG tablet Take 1 tablet (50 mg total)  by mouth daily. 90 tablet 1   Multiple Vitamins-Minerals (PRESERVISION AREDS 2) CAPS Take 2 capsules by mouth daily.     oxybutynin (DITROPAN XL) 15 MG 24 hr tablet Take 1 tablet (15 mg total) by mouth daily. 30 tablet 11   rosuvastatin (CRESTOR) 20 MG tablet Take 1 tablet by mouth once daily 90 tablet 3   No current facility-administered medications for this visit.    Allergies:   Lisinopril, Ace inhibitors, and Baclofen    Social History:   reports that she has never smoked. She has never used smokeless tobacco. She reports that she does not drink alcohol and does not use drugs.   Family History:  family history includes Arthritis in her father and mother; Breast cancer in her maternal aunt; Heart disease in her father; Stroke in her father and  son; Sudden Cardiac Death in her father.    ROS:     Review of Systems  Constitutional: Negative.   HENT: Negative.    Eyes: Negative.   Respiratory:  Positive for shortness of breath.   Cardiovascular:  Negative for chest pain, orthopnea and PND.  Gastrointestinal: Negative.   Genitourinary: Negative.   Musculoskeletal: Negative.   Skin: Negative.   Neurological: Negative.  Negative for headaches.  Endo/Heme/Allergies: Negative.   Psychiatric/Behavioral: Negative.    All other systems reviewed and are negative.     All other systems are reviewed and negative.    PHYSICAL EXAM: VS:  BP (!) 152/64   Pulse 65   Ht 5\' 9"  (1.753 m)   Wt 204 lb (92.5 kg)   SpO2 96%   BMI 30.13 kg/m  , BMI Body mass index is 30.13 kg/m. Last weight:  Wt Readings from Last 3 Encounters:  06/27/22 204 lb (92.5 kg)  05/30/22 202 lb 12.8 oz (92 kg)  05/21/22 203 lb (92.1 kg)     Physical Exam Constitutional:      Appearance: Normal appearance.  Cardiovascular:     Rate and Rhythm: Normal rate and regular rhythm.     Heart sounds: Normal heart sounds.  Pulmonary:     Effort: Pulmonary effort is normal.     Breath sounds: Normal breath sounds.  Musculoskeletal:     Right lower leg: No edema.     Left lower leg: No edema.  Neurological:     Mental Status: She is alert.       EKG:   Recent Labs: 10/29/2021: TSH 5.63 11/22/2021: Magnesium 1.9 01/10/2022: B Natriuretic Peptide 159.3 05/09/2022: ALT 19; Hemoglobin 12.7; Platelets 203 05/28/2022: BUN 38; Creatinine, Ser 1.30; Potassium 4.2; Sodium 142    Lipid Panel    Component Value Date/Time   CHOL 118 05/09/2022 1513   CHOL 153 03/01/2012 1132   TRIG 140 05/09/2022 1513   TRIG 127 03/01/2012 1132   HDL 40 (L) 05/09/2022 1513   HDL 27 (L) 03/01/2012 1132   CHOLHDL 3.0 05/09/2022 1513   VLDL 19.0 01/11/2021 1001   VLDL 25 03/01/2012 1132   LDLCALC 56 05/09/2022 1513   LDLCALC 101 (H) 03/01/2012 1132   LDLDIRECT 64.0  05/17/2019 1058      Other studies Reviewed: Additional studies/ records that were reviewed today include:  Review of the above records demonstrates:       No data to display            ASSESSMENT AND PLAN:    ICD-10-CM   1. Coronary artery disease involving native coronary artery of native heart without  angina pectoris  I25.10 MYOCARDIAL PERFUSION IMAGING    PCV ECHOCARDIOGRAM COMPLETE    Ambulatory referral to Vascular Surgery    2. Primary hypertension  I10 MYOCARDIAL PERFUSION IMAGING    PCV ECHOCARDIOGRAM COMPLETE    Ambulatory referral to Vascular Surgery    3. Paroxysmal atrial fibrillation (HCC)  I48.0 MYOCARDIAL PERFUSION IMAGING    PCV ECHOCARDIOGRAM COMPLETE    Ambulatory referral to Vascular Surgery    4. S/P CABG x 2  Z95.1 MYOCARDIAL PERFUSION IMAGING    PCV ECHOCARDIOGRAM COMPLETE    Ambulatory referral to Vascular Surgery    5. Unilateral small kidney  N27.0 MYOCARDIAL PERFUSION IMAGING    PCV ECHOCARDIOGRAM COMPLETE    Ambulatory referral to Vascular Surgery    6. Hydronephrosis with ureteral stricture, not elsewhere classified  N13.1 MYOCARDIAL PERFUSION IMAGING    PCV ECHOCARDIOGRAM COMPLETE    Ambulatory referral to Vascular Surgery    7. Stage 3 chronic kidney disease, unspecified whether stage 3a or 3b CKD (HCC)  N18.30 MYOCARDIAL PERFUSION IMAGING    PCV ECHOCARDIOGRAM COMPLETE    Ambulatory referral to Vascular Surgery    8. Dyspnea on exertion  R06.09 MYOCARDIAL PERFUSION IMAGING    PCV ECHOCARDIOGRAM COMPLETE    Ambulatory referral to Vascular Surgery    9. Renal artery stenosis (HCC)  I70.1 MYOCARDIAL PERFUSION IMAGING    PCV ECHOCARDIOGRAM COMPLETE    Ambulatory referral to Vascular Surgery   CTA aorta in office showed diffuse disease in left renal artery and hydronephrosis, thus will get Dr. Wyn Quaker to see patient       Problem List Items Addressed This Visit       Cardiovascular and Mediastinum   Hypertension (Chronic)    BP  high, increase hyralizine 25 QID      Relevant Medications   hydrALAZINE (APRESOLINE) 25 MG tablet   Other Relevant Orders   MYOCARDIAL PERFUSION IMAGING   PCV ECHOCARDIOGRAM COMPLETE   Ambulatory referral to Vascular Surgery   Paroxysmal atrial fibrillation (HCC) (Chronic)   Relevant Medications   hydrALAZINE (APRESOLINE) 25 MG tablet   Other Relevant Orders   MYOCARDIAL PERFUSION IMAGING   PCV ECHOCARDIOGRAM COMPLETE   Ambulatory referral to Vascular Surgery   CAD (coronary artery disease), native coronary artery - Primary    Had CABG 3 years back, advise stress tesst as DOE      Relevant Medications   hydrALAZINE (APRESOLINE) 25 MG tablet   Other Relevant Orders   MYOCARDIAL PERFUSION IMAGING   PCV ECHOCARDIOGRAM COMPLETE   Ambulatory referral to Vascular Surgery     Genitourinary   CKD (chronic kidney disease) stage 3, GFR 30-59 ml/min (HCC)   Relevant Orders   MYOCARDIAL PERFUSION IMAGING   PCV ECHOCARDIOGRAM COMPLETE   Ambulatory referral to Vascular Surgery   Hydronephrosis with ureteral stricture, not elsewhere classified   Relevant Orders   MYOCARDIAL PERFUSION IMAGING   PCV ECHOCARDIOGRAM COMPLETE   Ambulatory referral to Vascular Surgery     Other   Dyspnea (Chronic)   Relevant Orders   MYOCARDIAL PERFUSION IMAGING   PCV ECHOCARDIOGRAM COMPLETE   Ambulatory referral to Vascular Surgery   Unilateral small kidney   Relevant Orders   MYOCARDIAL PERFUSION IMAGING   PCV ECHOCARDIOGRAM COMPLETE   Ambulatory referral to Vascular Surgery   S/P CABG x 2    Do stress test as has DOE      Relevant Orders   MYOCARDIAL PERFUSION IMAGING   PCV ECHOCARDIOGRAM COMPLETE   Ambulatory referral to  Vascular Surgery   Other Visit Diagnoses     Renal artery stenosis [facility])       CTA aorta in office showed diffuse disease in left renal artery and hydronephrosis, thus will get Dr. Wyn Quaker to see patient   Relevant Medications   hydrALAZINE (APRESOLINE) 25 MG tablet    Other Relevant Orders   MYOCARDIAL PERFUSION IMAGING   PCV ECHOCARDIOGRAM COMPLETE   Ambulatory referral to Vascular Surgery          Disposition:   Return in about 2 weeks (around 07/11/2022) for stress test and consult vascular for renal artery stenosis.    Total time spent: 45 minutes  Signed,  Adrian Blackwater, MD  06/27/2022 10:00 AM    [facility]

## 2022-06-27 NOTE — Assessment & Plan Note (Signed)
BP high, increase hyralizine 25 QID

## 2022-07-04 ENCOUNTER — Ambulatory Visit (INDEPENDENT_AMBULATORY_CARE_PROVIDER_SITE_OTHER): Payer: BC Managed Care – PPO

## 2022-07-04 DIAGNOSIS — I2510 Atherosclerotic heart disease of native coronary artery without angina pectoris: Secondary | ICD-10-CM

## 2022-07-04 DIAGNOSIS — I480 Paroxysmal atrial fibrillation: Secondary | ICD-10-CM | POA: Diagnosis not present

## 2022-07-04 DIAGNOSIS — Z951 Presence of aortocoronary bypass graft: Secondary | ICD-10-CM | POA: Diagnosis not present

## 2022-07-04 DIAGNOSIS — R0609 Other forms of dyspnea: Secondary | ICD-10-CM

## 2022-07-04 DIAGNOSIS — N270 Small kidney, unilateral: Secondary | ICD-10-CM

## 2022-07-04 DIAGNOSIS — N183 Chronic kidney disease, stage 3 unspecified: Secondary | ICD-10-CM

## 2022-07-04 DIAGNOSIS — I10 Essential (primary) hypertension: Secondary | ICD-10-CM

## 2022-07-04 DIAGNOSIS — N131 Hydronephrosis with ureteral stricture, not elsewhere classified: Secondary | ICD-10-CM

## 2022-07-04 DIAGNOSIS — I701 Atherosclerosis of renal artery: Secondary | ICD-10-CM

## 2022-07-04 MED ORDER — TECHNETIUM TC 99M SESTAMIBI GENERIC - CARDIOLITE
32.9000 | Freq: Once | INTRAVENOUS | Status: AC | PRN
  Administered 2022-07-04: 32.9 via INTRAVENOUS

## 2022-07-04 MED ORDER — TECHNETIUM TC 99M SESTAMIBI GENERIC - CARDIOLITE
10.0000 | Freq: Once | INTRAVENOUS | Status: AC | PRN
Start: 2022-07-04 — End: 2022-07-04
  Administered 2022-07-04: 10 via INTRAVENOUS

## 2022-07-08 ENCOUNTER — Ambulatory Visit (INDEPENDENT_AMBULATORY_CARE_PROVIDER_SITE_OTHER): Payer: BC Managed Care – PPO

## 2022-07-08 DIAGNOSIS — I361 Nonrheumatic tricuspid (valve) insufficiency: Secondary | ICD-10-CM | POA: Diagnosis not present

## 2022-07-08 DIAGNOSIS — N270 Small kidney, unilateral: Secondary | ICD-10-CM

## 2022-07-08 DIAGNOSIS — I371 Nonrheumatic pulmonary valve insufficiency: Secondary | ICD-10-CM

## 2022-07-08 DIAGNOSIS — I351 Nonrheumatic aortic (valve) insufficiency: Secondary | ICD-10-CM | POA: Diagnosis not present

## 2022-07-08 DIAGNOSIS — Z951 Presence of aortocoronary bypass graft: Secondary | ICD-10-CM

## 2022-07-08 DIAGNOSIS — I340 Nonrheumatic mitral (valve) insufficiency: Secondary | ICD-10-CM | POA: Diagnosis not present

## 2022-07-08 DIAGNOSIS — I2510 Atherosclerotic heart disease of native coronary artery without angina pectoris: Secondary | ICD-10-CM

## 2022-07-08 DIAGNOSIS — I10 Essential (primary) hypertension: Secondary | ICD-10-CM

## 2022-07-08 DIAGNOSIS — N131 Hydronephrosis with ureteral stricture, not elsewhere classified: Secondary | ICD-10-CM

## 2022-07-08 DIAGNOSIS — R0609 Other forms of dyspnea: Secondary | ICD-10-CM

## 2022-07-08 DIAGNOSIS — I480 Paroxysmal atrial fibrillation: Secondary | ICD-10-CM

## 2022-07-08 DIAGNOSIS — I701 Atherosclerosis of renal artery: Secondary | ICD-10-CM

## 2022-07-08 DIAGNOSIS — N183 Chronic kidney disease, stage 3 unspecified: Secondary | ICD-10-CM

## 2022-07-11 ENCOUNTER — Encounter: Payer: Self-pay | Admitting: Cardiovascular Disease

## 2022-07-11 ENCOUNTER — Ambulatory Visit (INDEPENDENT_AMBULATORY_CARE_PROVIDER_SITE_OTHER): Payer: BC Managed Care – PPO | Admitting: Cardiovascular Disease

## 2022-07-11 VITALS — BP 142/60 | HR 64 | Ht 69.0 in | Wt 206.0 lb

## 2022-07-11 DIAGNOSIS — I480 Paroxysmal atrial fibrillation: Secondary | ICD-10-CM | POA: Diagnosis not present

## 2022-07-11 DIAGNOSIS — R600 Localized edema: Secondary | ICD-10-CM

## 2022-07-11 DIAGNOSIS — I2510 Atherosclerotic heart disease of native coronary artery without angina pectoris: Secondary | ICD-10-CM | POA: Diagnosis not present

## 2022-07-11 DIAGNOSIS — I5032 Chronic diastolic (congestive) heart failure: Secondary | ICD-10-CM

## 2022-07-11 DIAGNOSIS — I10 Essential (primary) hypertension: Secondary | ICD-10-CM

## 2022-07-11 NOTE — Progress Notes (Signed)
Cardiology Office Note   Date:  07/11/2022   ID:  Audrey Wolf, DOB 29-Apr-1950, MRN [id number]  PCP:  Glori Luis, MD  Cardiologist:  Adrian Blackwater, MD      History of Present Illness: Audrey Wolf is a 72 y.o. female who presents for  Chief Complaint  Patient presents with   Follow-up    NST & ECHO Results    SOB on exertion and dry cough.  Shortness of Breath This is a chronic problem. The current episode started more than 1 month ago. The problem has been unchanged. Associated symptoms include rhinorrhea. Pertinent negatives include no sore throat.      Past Medical History:  Diagnosis Date   Arthritis    Asthma    CHF (congestive heart failure) (HCC)    Chronic kidney disease    Coronary artery disease    GERD (gastroesophageal reflux disease)    Headache    Hypertension    Nephrolithiasis    NSTEMI (non-ST elevated myocardial infarction) (HCC)    Postprandial RUQ pain 05/25/2019     Past Surgical History:  Procedure Laterality Date   ABDOMINAL HYSTERECTOMY     APPENDECTOMY     BREAST BIOPSY Left 12/31/2010   neg   CORONARY ARTERY BYPASS GRAFT N/A 05/22/2017   Procedure: CORONARY ARTERY BYPASS GRAFTING (CABG) x 2 WITH ENDOSCOPIC HARVESTING OF RIGHT SAPHENOUS VEIN;  Surgeon: Delight Ovens, MD;  Location: [facility] OR;  Service: Open Heart Surgery;  Laterality: N/A;   LEFT HEART CATH AND CORONARY ANGIOGRAPHY N/A 05/18/2017   Procedure: LEFT HEART CATH AND CORONARY ANGIOGRAPHY;  Surgeon: Laurier Nancy, MD;  Location: [facility] INVASIVE CV LAB;  Service: Cardiovascular;  Laterality: N/A;   LITHOTRIPSY     TEE WITHOUT CARDIOVERSION N/A 05/22/2017   Procedure: TRANSESOPHAGEAL ECHOCARDIOGRAM (TEE);  Surgeon: Delight Ovens, MD;  Location: [facility] OR;  Service: Open Heart Surgery;  Laterality: N/A;   TONSILLECTOMY       Current Outpatient Medications  Medication Sig Dispense Refill   acetaminophen (TYLENOL) 500 MG tablet Take 1 tablet (500 mg  total) by mouth daily as needed for mild pain or headache. 30 tablet 0   albuterol (VENTOLIN HFA) 108 (90 Base) MCG/ACT inhaler SMARTSIG:2 Puff(s) By Mouth Every 4-6 Hours PRN     apixaban (ELIQUIS) 5 MG TABS tablet Take 1 tablet (5 mg total) by mouth 2 (two) times daily. 60 tablet 2   azelastine (ASTELIN) 0.1 % nasal spray Place 2 sprays into both nostrils 2 (two) times daily. Use in each nostril as directed 30 mL 12   budesonide-formoterol (SYMBICORT) 80-4.5 MCG/ACT inhaler Inhale 2 puffs into the lungs 2 (two) times daily. 1 each 3   carvedilol (COREG) 25 MG tablet TAKE 1 TABLET BY MOUTH TWICE DAILY WITH MEALS 180 tablet 3   diltiazem (CARDIZEM CD) 180 MG 24 hr capsule Take 1 capsule (180 mg total) by mouth daily. 30 capsule 11   famotidine (PEPCID) 20 MG tablet Take 20 mg by mouth 2 (two) times daily.     hydrALAZINE (APRESOLINE) 25 MG tablet Take 1 tablet (25 mg total) by mouth 4 (four) times daily. 120 tablet 11   Iron, Ferrous Sulfate, 325 (65 Fe) MG TABS Take 1 tablet by mouth daily with breakfast.     loratadine (CLARITIN) 10 MG tablet Take 10 mg by mouth daily.      losartan (COZAAR) 50 MG tablet Take 1 tablet (50 mg total) by mouth daily.  90 tablet 1   Multiple Vitamins-Minerals (PRESERVISION AREDS 2) CAPS Take 2 capsules by mouth daily.     oxybutynin (DITROPAN XL) 15 MG 24 hr tablet Take 1 tablet (15 mg total) by mouth daily. 30 tablet 11   rosuvastatin (CRESTOR) 20 MG tablet Take 1 tablet by mouth once daily 90 tablet 3   No current facility-administered medications for this visit.    Allergies:   Lisinopril, Ace inhibitors, and Baclofen    Social History:   reports that she has never smoked. She has never used smokeless tobacco. She reports that she does not drink alcohol and does not use drugs.   Family History:  family history includes Arthritis in her father and mother; Breast cancer in her maternal aunt; Heart disease in her father; Stroke in her father and son; Sudden  Cardiac Death in her father.    ROS:     Review of Systems  Constitutional: Negative.   HENT:  Positive for rhinorrhea. Negative for sore throat.   Eyes: Negative.   Respiratory:  Positive for shortness of breath.   Gastrointestinal: Negative.   Genitourinary: Negative.   Musculoskeletal: Negative.   Skin: Negative.   Neurological: Negative.   Endo/Heme/Allergies: Negative.   Psychiatric/Behavioral: Negative.    All other systems reviewed and are negative.     All other systems are reviewed and negative.    PHYSICAL EXAM: VS:  BP (!) 142/60   Pulse 64   Ht 5\' 9"  (1.753 m)   Wt 206 lb (93.4 kg)   SpO2 96%   BMI 30.42 kg/m  , BMI Body mass index is 30.42 kg/m. Last weight:  Wt Readings from Last 3 Encounters:  07/11/22 206 lb (93.4 kg)  06/27/22 204 lb (92.5 kg)  05/30/22 202 lb 12.8 oz (92 kg)     Physical Exam Constitutional:      Appearance: Normal appearance.  Cardiovascular:     Rate and Rhythm: Normal rate and regular rhythm.     Heart sounds: Normal heart sounds.  Pulmonary:     Effort: Pulmonary effort is normal.     Breath sounds: Normal breath sounds.  Musculoskeletal:     Right lower leg: No edema.     Left lower leg: No edema.  Neurological:     Mental Status: She is alert.       EKG:   Recent Labs: 10/29/2021: TSH 5.63 11/22/2021: Magnesium 1.9 01/10/2022: B Natriuretic Peptide 159.3 05/09/2022: ALT 19; Hemoglobin 12.7; Platelets 203 05/28/2022: BUN 38; Creatinine, Ser 1.30; Potassium 4.2; Sodium 142    Lipid Panel    Component Value Date/Time   CHOL 118 05/09/2022 1513   CHOL 153 03/01/2012 1132   TRIG 140 05/09/2022 1513   TRIG 127 03/01/2012 1132   HDL 40 (L) 05/09/2022 1513   HDL 27 (L) 03/01/2012 1132   CHOLHDL 3.0 05/09/2022 1513   VLDL 19.0 01/11/2021 1001   VLDL 25 03/01/2012 1132   LDLCALC 56 05/09/2022 1513   LDLCALC 101 (H) 03/01/2012 1132   LDLDIRECT 64.0 05/17/2019 1058      Other studies Reviewed: Additional  studies/ records that were reviewed today include:  Review of the above records demonstrates:       No data to display            ASSESSMENT AND PLAN:  No diagnosis found.   Problem List Items Addressed This Visit   None      Disposition:   No follow-ups on file.  Total time spent: 30 minutes  Signed,  Adrian Blackwater, MD  07/11/2022 10:00 AM    [facility]

## 2022-07-13 ENCOUNTER — Other Ambulatory Visit: Payer: Self-pay | Admitting: Cardiovascular Disease

## 2022-07-14 DIAGNOSIS — I10 Essential (primary) hypertension: Secondary | ICD-10-CM | POA: Diagnosis not present

## 2022-07-14 DIAGNOSIS — N1831 Chronic kidney disease, stage 3a: Secondary | ICD-10-CM | POA: Diagnosis not present

## 2022-07-14 DIAGNOSIS — I701 Atherosclerosis of renal artery: Secondary | ICD-10-CM | POA: Diagnosis not present

## 2022-07-15 ENCOUNTER — Ambulatory Visit: Payer: Medicare Other | Admitting: Family Medicine

## 2022-07-16 NOTE — Progress Notes (Deleted)
07/17/2022 3:19 PM   Audrey Wolf [birth year] [id number]  Referring provider: Glori Luis, MD [address] [place],  [place] [address]  Urological history: 1. Chronic left hydronephrosis -Secondary to an impacted left ureteral stone -serum creatinine (07/2022) 1.21, eGFR 48  2. OAB -Contributing factors of age, hypertension, asthma, prediabetes, obesity and antihistamines -Oxybutynin XL 15 mg daily  No chief complaint on file.   HPI: Audrey Wolf is a 72 y.o. female who presents today for 3 month follow up.   Previous records reviewed.   PVR ***  PMH: Past Medical History:  Diagnosis Date   Arthritis    Asthma    CHF (congestive heart failure) (HCC)    Chronic kidney disease    Coronary artery disease    GERD (gastroesophageal reflux disease)    Headache    Hypertension    Nephrolithiasis    NSTEMI (non-ST elevated myocardial infarction) (HCC)    Postprandial RUQ pain 05/25/2019    Surgical History: Past Surgical History:  Procedure Laterality Date   ABDOMINAL HYSTERECTOMY     APPENDECTOMY     BREAST BIOPSY Left 12/31/2010   neg   CORONARY ARTERY BYPASS GRAFT N/A 05/22/2017   Procedure: CORONARY ARTERY BYPASS GRAFTING (CABG) x 2 WITH ENDOSCOPIC HARVESTING OF RIGHT SAPHENOUS VEIN;  Surgeon: Delight Ovens, MD;  Location: [facility] OR;  Service: Open Heart Surgery;  Laterality: N/A;   LEFT HEART CATH AND CORONARY ANGIOGRAPHY N/A 05/18/2017   Procedure: LEFT HEART CATH AND CORONARY ANGIOGRAPHY;  Surgeon: Laurier Nancy, MD;  Location: [facility] INVASIVE CV LAB;  Service: Cardiovascular;  Laterality: N/A;   LITHOTRIPSY     TEE WITHOUT CARDIOVERSION N/A 05/22/2017   Procedure: TRANSESOPHAGEAL ECHOCARDIOGRAM (TEE);  Surgeon: Delight Ovens, MD;  Location: [facility] OR;  Service: Open Heart Surgery;  Laterality: N/A;   TONSILLECTOMY      Home Medications:  Allergies as of 07/17/2022       Reactions   Lisinopril Cough   Ace Inhibitors Other  (See Comments)   Has been told to avoid these and any diuretics because "left kidney has shrunk up and doesn't work"   Baclofen Other (See Comments)   Urinary frequency         Medication List        Accurate as of July 16, 2022  3:19 PM. If you have any questions, ask your nurse or doctor.          acetaminophen 500 MG tablet Commonly known as: TYLENOL Take 1 tablet (500 mg total) by mouth daily as needed for mild pain or headache.   albuterol 108 (90 Base) MCG/ACT inhaler Commonly known as: VENTOLIN HFA SMARTSIG:2 Puff(s) By Mouth Every 4-6 Hours PRN   azelastine 0.1 % nasal spray Commonly known as: ASTELIN Place 2 sprays into both nostrils 2 (two) times daily. Use in each nostril as directed   budesonide-formoterol 80-4.5 MCG/ACT inhaler Commonly known as: SYMBICORT Inhale 2 puffs into the lungs 2 (two) times daily.   carvedilol 25 MG tablet Commonly known as: COREG TAKE 1 TABLET BY MOUTH TWICE DAILY WITH MEALS   diltiazem 180 MG 24 hr capsule Commonly known as: Cardizem CD Take 1 capsule (180 mg total) by mouth daily.   Eliquis 5 MG Tabs tablet Generic drug: apixaban Take 1 tablet by mouth twice daily   famotidine 20 MG tablet Commonly known as: PEPCID Take 20 mg by mouth 2 (two) times daily.   hydrALAZINE 25  MG tablet Commonly known as: APRESOLINE Take 1 tablet (25 mg total) by mouth 4 (four) times daily.   Iron (Ferrous Sulfate) 325 (65 Fe) MG Tabs Take 1 tablet by mouth daily with breakfast.   loratadine 10 MG tablet Commonly known as: CLARITIN Take 10 mg by mouth daily.   losartan 50 MG tablet Commonly known as: COZAAR Take 1 tablet (50 mg total) by mouth daily.   oxybutynin 15 MG 24 hr tablet Commonly known as: DITROPAN XL Take 1 tablet (15 mg total) by mouth daily.   PreserVision AREDS 2 Caps Take 2 capsules by mouth daily.   rosuvastatin 20 MG tablet Commonly known as: CRESTOR Take 1 tablet by mouth once daily         Allergies:  Allergies  Allergen Reactions   Lisinopril Cough   Ace Inhibitors Other (See Comments)    Has been told to avoid these and any diuretics because "left kidney has shrunk up and doesn't work"   Baclofen Other (See Comments)    Urinary frequency     Family History: Family History  Problem Relation Age of Onset   Arthritis Mother    Arthritis Father    Heart disease Father    Stroke Father    Sudden Cardiac Death Father    Stroke Son    Breast cancer Maternal Aunt     Social History:  reports that she has never smoked. She has never used smokeless tobacco. She reports that she does not drink alcohol and does not use drugs.  ROS: Pertinent ROS in HPI  Physical Exam: There were no vitals taken for this visit.  Constitutional:  Well nourished. Alert and oriented, No acute distress. HEENT:  AT, moist mucus membranes.  Trachea midline, no masses. Cardiovascular: No clubbing, cyanosis, or edema. Respiratory: Normal respiratory effort, no increased work of breathing. GU: No CVA tenderness.  No bladder fullness or masses. Vulvovaginal atrophy w/ pallor, loss of rugae, introital retraction, excoriations.  Vulvar thinning, fusion of labia, clitoral hood retraction, prominent urethral meatus.   *** external genitalia, *** pubic hair distribution, no lesions.  Normal urethral meatus, no lesions, no prolapse, no discharge.   No urethral masses, tenderness and/or tenderness. No bladder fullness, tenderness or masses. *** vagina mucosa, *** estrogen effect, no discharge, no lesions, *** pelvic support, *** cystocele and *** rectocele noted.  No cervical motion tenderness.  Uterus is freely mobile and non-fixed.  No adnexal/parametria masses or tenderness noted.  Anus and perineum are without rashes or lesions.   ***  Neurologic: Grossly intact, no focal deficits, moving all 4 extremities. Psychiatric: Normal mood and affect.    Laboratory Data: Lab Results  Component Value  Date   WBC 5.8 05/09/2022   HGB 12.7 05/09/2022   HCT 36.6 05/09/2022   MCV 91.7 05/09/2022   PLT 203 05/09/2022    Lab Results  Component Value Date   CREATININE 1.30 (H) 05/28/2022    Lab Results  Component Value Date   HGBA1C 5.4 05/09/2022    Lab Results  Component Value Date   TSH 5.63 (H) 10/29/2021       Component Value Date/Time   CHOL 118 05/09/2022 1513   CHOL 153 03/01/2012 1132   HDL 40 (L) 05/09/2022 1513   HDL 27 (L) 03/01/2012 1132   CHOLHDL 3.0 05/09/2022 1513   VLDL 19.0 01/11/2021 1001   VLDL 25 03/01/2012 1132   LDLCALC 56 05/09/2022 1513   LDLCALC 101 (H) 03/01/2012 1132  Lab Results  Component Value Date   AST 19 05/09/2022   Lab Results  Component Value Date   ALT 19 05/09/2022  I have reviewed the labs.   Pertinent Imaging: ***  Assessment & Plan:  ***  1. OAB -PVR ***   No follow-ups on file.  These notes generated with voice recognition software. I apologize for typographical errors.  Cloretta Ned  [facility] [address]  [address] [place], [place] [address] [phone]

## 2022-07-17 ENCOUNTER — Ambulatory Visit: Payer: Medicare Other | Admitting: Urology

## 2022-07-21 ENCOUNTER — Other Ambulatory Visit (INDEPENDENT_AMBULATORY_CARE_PROVIDER_SITE_OTHER): Payer: Self-pay | Admitting: Nurse Practitioner

## 2022-07-21 DIAGNOSIS — N270 Small kidney, unilateral: Secondary | ICD-10-CM

## 2022-07-21 DIAGNOSIS — I701 Atherosclerosis of renal artery: Secondary | ICD-10-CM

## 2022-07-22 ENCOUNTER — Encounter (INDEPENDENT_AMBULATORY_CARE_PROVIDER_SITE_OTHER): Payer: Self-pay | Admitting: Nurse Practitioner

## 2022-07-22 ENCOUNTER — Ambulatory Visit (INDEPENDENT_AMBULATORY_CARE_PROVIDER_SITE_OTHER): Payer: BC Managed Care – PPO | Admitting: Nurse Practitioner

## 2022-07-22 ENCOUNTER — Ambulatory Visit (INDEPENDENT_AMBULATORY_CARE_PROVIDER_SITE_OTHER): Payer: BC Managed Care – PPO

## 2022-07-22 VITALS — BP 198/63 | HR 58 | Resp 18 | Ht 69.0 in | Wt 207.8 lb

## 2022-07-22 DIAGNOSIS — N183 Chronic kidney disease, stage 3 unspecified: Secondary | ICD-10-CM

## 2022-07-22 DIAGNOSIS — N270 Small kidney, unilateral: Secondary | ICD-10-CM | POA: Diagnosis not present

## 2022-07-22 DIAGNOSIS — E782 Mixed hyperlipidemia: Secondary | ICD-10-CM | POA: Diagnosis not present

## 2022-07-22 DIAGNOSIS — I701 Atherosclerosis of renal artery: Secondary | ICD-10-CM

## 2022-07-22 NOTE — H&P (View-Only) (Signed)
 Subjective:    Patient ID: Audrey Wolf, female    DOB: 07/20/1950, 71 y.o.   MRN: [id number] Chief Complaint  Patient presents with   New Patient (Initial Visit)    NP. renal/consult. RAS. unilateral small kidney. khan    Audrey Wolf is a 71-year-old female that patient is seen for evaluation of malignant hypertension which has been very difficult to control. The patient has a long history of hypertension which recently has become increasingly difficult to control utilizing medical therapy. The patient has consistently documented systolic blood pressures near 200 with diastolic pressures over 90.   The patient does have family history of hypertension.   There is no prior documented abdominal bruit. The patient occasionally has flushing symptoms but denies palpitations. No episodes of syncope.There is history of headache. There is no history of flash pulmonary edema.  The patient has chronic kidney disease stage III.  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  There is currently no flow seen within the left kidney.  No recent shortening of the patient's walking distance or new symptoms consistent with claudication.  No history of rest pain symptoms. No new ulcers or wounds of the lower extremities have occurred.  The patient denies amaurosis fugax or recent TIA symptoms. There are no recent neurological changes noted. There is no history of DVT, PE or superficial thrombophlebitis. No recent episodes of angina or shortness of breath documented.   Duplex ultrasound of the renal arteries demonstrates velocities greater than 60% at the proximal right renal artery this is consistent with previous studies that were done on 05/27/2022     Review of Systems  Neurological:  Positive for headaches.  All other systems reviewed and are negative.      Objective:   Physical Exam Vitals reviewed.  HENT:     Head: Normocephalic.   Cardiovascular:     Rate and Rhythm: Normal rate.     Pulses:          Radial pulses are 2+ on the right side and 2+ on the left side.  Pulmonary:     Effort: Pulmonary effort is normal.  Skin:    General: Skin is warm and dry.  Neurological:     Mental Status: She is alert and oriented to person, place, and time.  Psychiatric:        Mood and Affect: Mood normal.        Behavior: Behavior normal.        Thought Content: Thought content normal.        Judgment: Judgment normal.     BP (!) 198/63 (BP Location: Right Arm)   Pulse (!) 58   Resp 18   Ht 5' 9" (1.753 m)   Wt 207 lb 12.8 oz (94.3 kg)   BMI 30.69 kg/m   Past Medical History:  Diagnosis Date   Arthritis    Asthma    CHF (congestive heart failure) (HCC)    Chronic kidney disease    Coronary artery disease    GERD (gastroesophageal reflux disease)    Headache    Hypertension    Nephrolithiasis    NSTEMI (non-ST elevated myocardial infarction) (HCC)    Postprandial RUQ pain 05/25/2019    Social History   Socioeconomic History   Marital status: Single    Spouse name: Not on file   Number of children: Not on file   Years of education: Not   on file   Highest education level: Not on file  Occupational History    Employer: [employer]  Tobacco Use   Smoking status: Never   Smokeless tobacco: Never  Vaping Use   Vaping Use: Never used  Substance and Sexual Activity   Alcohol use: No   Drug use: No   Sexual activity: Not Currently    Partners: Male    Birth control/protection: Post-menopausal  Other Topics Concern   Not on file  Social History Narrative   Not on file   Social Determinants of Health   Financial Resource Strain: Low Risk  (10/03/2021)   Overall Financial Resource Strain (CARDIA)    Difficulty of Paying Living Expenses: Not hard at all  Food Insecurity: No Food Insecurity (11/19/2021)   Hunger Vital Sign    Worried About Running Out of Food in the Last Year: Never true    Ran Out of  Food in the Last Year: Never true  Transportation Needs: No Transportation Needs (11/19/2021)   PRAPARE - Transportation    Lack of Transportation (Medical): No    Lack of Transportation (Non-Medical): No  Physical Activity: Sufficiently Active (10/03/2021)   Exercise Vital Sign    Days of Exercise per Week: 5 days    Minutes of Exercise per Session: 30 min  Stress: No Stress Concern Present (10/03/2021)   [employer] - Occupational Stress Questionnaire    Feeling of Stress : Only a little  Social Connections: Unknown (10/03/2021)   Social Connection and Isolation Panel [NHANES]    Frequency of Communication with Friends and Family: More than three times a week    Frequency of Social Gatherings with Friends and Family: More than three times a week    Attends Religious Services: Not on file    Active Member of Clubs or Organizations: Not on file    Attends Club or Organization Meetings: Not on file    Marital Status: Not on file  Intimate Partner Violence: Not At Risk (11/19/2021)   Humiliation, Afraid, Rape, and Kick questionnaire    Fear of Current or Ex-Partner: No    Emotionally Abused: No    Physically Abused: No    Sexually Abused: No    Past Surgical History:  Procedure Laterality Date   ABDOMINAL HYSTERECTOMY     APPENDECTOMY     BREAST BIOPSY Left 12/31/2010   neg   CORONARY ARTERY BYPASS GRAFT N/A 05/22/2017   Procedure: CORONARY ARTERY BYPASS GRAFTING (CABG) x 2 WITH ENDOSCOPIC HARVESTING OF RIGHT SAPHENOUS VEIN;  Surgeon: Gerhardt, Edward B, MD;  Location: [facility] OR;  Service: Open Heart Surgery;  Laterality: N/A;   LEFT HEART CATH AND CORONARY ANGIOGRAPHY N/A 05/18/2017   Procedure: LEFT HEART CATH AND CORONARY ANGIOGRAPHY;  Surgeon: Khan, Shaukat A, MD;  Location: [facility] INVASIVE CV LAB;  Service: Cardiovascular;  Laterality: N/A;   LITHOTRIPSY     TEE WITHOUT CARDIOVERSION N/A 05/22/2017   Procedure: TRANSESOPHAGEAL ECHOCARDIOGRAM (TEE);   Surgeon: Gerhardt, Edward B, MD;  Location: [facility] OR;  Service: Open Heart Surgery;  Laterality: N/A;   TONSILLECTOMY      Family History  Problem Relation Age of Onset   Arthritis Mother    Arthritis Father    Heart disease Father    Stroke Father    Sudden Cardiac Death Father    Stroke Son    Breast cancer Maternal Aunt     Allergies  Allergen Reactions   Lisinopril Cough   Ace Inhibitors   Other (See Comments)    Has been told to avoid these and any diuretics because "left kidney has shrunk up and doesn't work"   Baclofen Other (See Comments)    Urinary frequency        Latest Ref Rng & Units 05/09/2022    3:13 PM 01/10/2022   10:40 AM 12/30/2021    1:08 PM  CBC  WBC 3.8 - 10.8 Thousand/uL 5.8  5.3  5.1   Hemoglobin 11.7 - 15.5 g/dL 12.7  10.0  10.0   Hematocrit 35.0 - 45.0 % 36.6  32.1  31.2   Platelets 140 - 400 Thousand/uL 203  220  220.0       CMP     Component Value Date/Time   NA 142 05/28/2022 1006   NA 144 08/19/2019 1533   NA 140 03/01/2012 1132   K 4.2 05/28/2022 1006   K 3.5 03/01/2012 1132   CL 109 05/28/2022 1006   CL 108 (H) 03/01/2012 1132   CO2 25 05/28/2022 1006   CO2 23 03/01/2012 1132   GLUCOSE 87 05/28/2022 1006   GLUCOSE 92 03/01/2012 1132   BUN 38 (H) 05/28/2022 1006   BUN 20 08/19/2019 1533   BUN 22 (H) 03/01/2012 1132   CREATININE 1.30 (H) 05/28/2022 1006   CREATININE 1.08 (H) 05/09/2022 1513   CALCIUM 8.8 05/28/2022 1006   CALCIUM 8.6 03/01/2012 1132   PROT 5.9 (L) 05/09/2022 1513   PROT 6.9 03/23/2012 0954   ALBUMIN 3.8 12/30/2021 1308   ALBUMIN 3.7 03/23/2012 0954   AST 19 05/09/2022 1513   AST 25 03/23/2012 0954   ALT 19 05/09/2022 1513   ALT 38 03/23/2012 0954   ALKPHOS 85 12/30/2021 1308   ALKPHOS 130 03/23/2012 0954   BILITOT 0.5 05/09/2022 1513   BILITOT 0.6 03/23/2012 0954   GFR 41.11 (L) 05/28/2022 1006   EGFR 55 (L) 05/09/2022 1513   GFRNONAA 57 (L) 01/10/2022 1040   GFRNONAA >60 03/01/2012 1132     No  results found.     Assessment & Plan:   1. Renal artery stenosis (HCC) Recommend:  The patient has evidence of severe atherosclerotic changes of the renal artery with worsening of the atherosclerosis of the renal arteries associated with severe hypertension.  This represents a high risk for CVA, MI and renal failure.  Patient should undergo angiography of the renal artery with the hope for intervention for control of hypertension.  The risks and benefits as well as the alternative therapies was discussed in detail with the patient.  All questions were answered.  Patient agrees to proceed with angiography.   The patietn will follow up in the office after the angiogram.  2. Mixed hyperlipidemia Continue statin as ordered and reviewed, no changes at this time  3. Stage 3 chronic kidney disease, unspecified whether stage 3a or 3b CKD (HCC) Will make efforts to use as little as possible given patient's chronic kidney disease.   Current Outpatient Medications on File Prior to Visit  Medication Sig Dispense Refill   acetaminophen (TYLENOL) 500 MG tablet Take 1 tablet (500 mg total) by mouth daily as needed for mild pain or headache. 30 tablet 0   albuterol (VENTOLIN HFA) 108 (90 Base) MCG/ACT inhaler SMARTSIG:2 Puff(s) By Mouth Every 4-6 Hours PRN     budesonide-formoterol (SYMBICORT) 80-4.5 MCG/ACT inhaler Inhale 2 puffs into the lungs 2 (two) times daily. 1 each 3   carvedilol (COREG) 25 MG tablet TAKE 1 TABLET BY MOUTH TWICE   DAILY WITH MEALS 180 tablet 3   diltiazem (CARDIZEM CD) 180 MG 24 hr capsule Take 1 capsule (180 mg total) by mouth daily. 30 capsule 11   ELIQUIS 5 MG TABS tablet Take 1 tablet by mouth twice daily 60 tablet 0   famotidine (PEPCID) 20 MG tablet Take 20 mg by mouth 2 (two) times daily.     hydrALAZINE (APRESOLINE) 25 MG tablet Take 1 tablet (25 mg total) by mouth 4 (four) times daily. 120 tablet 11   loratadine (CLARITIN) 10 MG tablet Take 10 mg by mouth daily.       losartan (COZAAR) 50 MG tablet Take 1 tablet (50 mg total) by mouth daily. 90 tablet 1   Multiple Vitamins-Minerals (PRESERVISION AREDS 2) CAPS Take 2 capsules by mouth daily.     rosuvastatin (CRESTOR) 20 MG tablet Take 1 tablet by mouth once daily 90 tablet 3   vitamin C (ASCORBIC ACID) 250 MG tablet Take 250 mg by mouth daily.     Vitamin D, Ergocalciferol, (DRISDOL) 1.25 MG (50000 UNIT) CAPS capsule Take 50,000 Units by mouth every 7 (seven) days.     No current facility-administered medications on file prior to visit.    There are no Patient Instructions on file for this visit. No follow-ups on file.   Leticia Mcdiarmid E Dakhari Zuver, NP   

## 2022-07-22 NOTE — Progress Notes (Signed)
Subjective:    Patient ID: Audrey Wolf, female    DOB: 05-Apr-1950, 72 y.o.   MRN: [id number] Chief Complaint  Patient presents with   New Patient (Initial Visit)    NP. renal/consult. RAS. unilateral small kidney. 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 The patient has a long history of hypertension which recently has become increasingly difficult to control utilizing medical therapy. The patient has consistently documented systolic blood pressures near 782 with diastolic pressures over 90.   The patient does have family history of hypertension.   There is no prior documented abdominal bruit. The patient occasionally has flushing symptoms but denies palpitations. No episodes of syncope.There is history of headache. There is no history of flash pulmonary edema.  The patient has chronic kidney disease stage III.  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  There is currently no flow seen within the left kidney.  No recent shortening of the patient's walking distance or new symptoms consistent with claudication.  No history of rest pain symptoms. No new ulcers or wounds of the lower extremities have occurred.  The patient denies amaurosis fugax or recent TIA symptoms. There are no recent neurological changes noted. There is no history of DVT, PE or superficial thrombophlebitis. No recent episodes of angina or shortness of breath documented.   Duplex ultrasound of the renal arteries demonstrates velocities greater than 60% at the proximal right renal artery this is consistent with previous studies that were done on 05/27/2022     Review of Systems  Neurological:  Positive for headaches.  All other systems reviewed and are negative.      Objective:   Physical Exam Vitals reviewed.  HENT:     Head: Normocephalic.   Cardiovascular:     Rate and Rhythm: Normal rate.     Pulses:          Radial pulses are 2+ on the right side and 2+ on the left side.  Pulmonary:     Effort: Pulmonary effort is normal.  Skin:    General: Skin is warm and dry.  Neurological:     Mental Status: She is alert and oriented to person, place, and time.  Psychiatric:        Mood and Affect: Mood normal.        Behavior: Behavior normal.        Thought Content: Thought content normal.        Judgment: Judgment normal.     BP (!) 198/63 (BP Location: Right Arm)   Pulse (!) 58   Resp 18   Ht 5\' 9"  (1.753 m)   Wt 207 lb 12.8 oz (94.3 kg)   BMI 30.69 kg/m   Past Medical History:  Diagnosis Date   Arthritis    Asthma    CHF (congestive heart failure) (HCC)    Chronic kidney disease    Coronary artery disease    GERD (gastroesophageal reflux disease)    Headache    Hypertension    Nephrolithiasis    NSTEMI (non-ST elevated myocardial infarction) (HCC)    Postprandial RUQ pain 05/25/2019    Social History   Socioeconomic History   Marital status: Single    Spouse name: Not on file   Number of children: Not on file   Years of education: Not  on file   Highest education level: Not on file  Occupational History    Employer: [employer]  Tobacco Use   Smoking status: Never   Smokeless tobacco: Never  Vaping Use   Vaping Use: Never used  Substance and Sexual Activity   Alcohol use: No   Drug use: No   Sexual activity: Not Currently    Partners: Male    Birth control/protection: Post-menopausal  Other Topics Concern   Not on file  Social History Narrative   Not on file   Social Determinants of Health   Financial Resource Strain: Low Risk  (10/03/2021)   Overall Financial Resource Strain (CARDIA)    Difficulty of Paying Living Expenses: Not hard at all  Food Insecurity: No Food Insecurity (11/19/2021)   Hunger Vital Sign    Worried About Running Out of Food in the Last Year: Never true    Ran Out of  Food in the Last Year: Never true  Transportation Needs: No Transportation Needs (11/19/2021)   PRAPARE - Administrator, Civil Service (Medical): No    Lack of Transportation (Non-Medical): No  Physical Activity: Sufficiently Active (10/03/2021)   Exercise Vital Sign    Days of Exercise per Week: 5 days    Minutes of Exercise per Session: 30 min  Stress: No Stress Concern Present (10/03/2021)   [name]    Feeling of Stress : Only a little  Social Connections: Unknown (10/03/2021)   Social Connection and Isolation Panel [NHANES]    Frequency of Communication with Friends and Family: More than three times a week    Frequency of Social Gatherings with Friends and Family: More than three times a week    Attends Religious Services: Not on file    Active Member of Clubs or Organizations: Not on file    Attends Banker Meetings: Not on file    Marital Status: Not on file  Intimate Partner Violence: Not At Risk (11/19/2021)   Humiliation, Afraid, Rape, and Kick questionnaire    Fear of Current or Ex-Partner: No    Emotionally Abused: No    Physically Abused: No    Sexually Abused: No    Past Surgical History:  Procedure Laterality Date   ABDOMINAL HYSTERECTOMY     APPENDECTOMY     BREAST BIOPSY Left 12/31/2010   neg   CORONARY ARTERY BYPASS GRAFT N/A 05/22/2017   Procedure: CORONARY ARTERY BYPASS GRAFTING (CABG) x 2 WITH ENDOSCOPIC HARVESTING OF RIGHT SAPHENOUS VEIN;  Surgeon: Delight Ovens, MD;  Location: [facility] OR;  Service: Open Heart Surgery;  Laterality: N/A;   LEFT HEART CATH AND CORONARY ANGIOGRAPHY N/A 05/18/2017   Procedure: LEFT HEART CATH AND CORONARY ANGIOGRAPHY;  Surgeon: Laurier Nancy, MD;  Location: [facility] INVASIVE CV LAB;  Service: Cardiovascular;  Laterality: N/A;   LITHOTRIPSY     TEE WITHOUT CARDIOVERSION N/A 05/22/2017   Procedure: TRANSESOPHAGEAL ECHOCARDIOGRAM (TEE);   Surgeon: Delight Ovens, MD;  Location: [facility] OR;  Service: Open Heart Surgery;  Laterality: N/A;   TONSILLECTOMY      Family History  Problem Relation Age of Onset   Arthritis Mother    Arthritis Father    Heart disease Father    Stroke Father    Sudden Cardiac Death Father    Stroke Son    Breast cancer Maternal Aunt     Allergies  Allergen Reactions   Lisinopril Cough   Ace Inhibitors  Other (See Comments)    Has been told to avoid these and any diuretics because "left kidney has shrunk up and doesn't work"   Baclofen Other (See Comments)    Urinary frequency        Latest Ref Rng & Units 05/09/2022    3:13 PM 01/10/2022   10:40 AM 12/30/2021    1:08 PM  CBC  WBC 3.8 - 10.8 Thousand/uL 5.8  5.3  5.1   Hemoglobin 11.7 - 15.5 g/dL 78.2  95.6  21.3   Hematocrit 35.0 - 45.0 % 36.6  32.1  31.2   Platelets 140 - 400 Thousand/uL 203  220  220.0       CMP     Component Value Date/Time   NA 142 05/28/2022 1006   NA 144 08/19/2019 1533   NA 140 03/01/2012 1132   K 4.2 05/28/2022 1006   K 3.5 03/01/2012 1132   CL 109 05/28/2022 1006   CL 108 (H) 03/01/2012 1132   CO2 25 05/28/2022 1006   CO2 23 03/01/2012 1132   GLUCOSE 87 05/28/2022 1006   GLUCOSE 92 03/01/2012 1132   BUN 38 (H) 05/28/2022 1006   BUN 20 08/19/2019 1533   BUN 22 (H) 03/01/2012 1132   CREATININE 1.30 (H) 05/28/2022 1006   CREATININE 1.08 (H) 05/09/2022 1513   CALCIUM 8.8 05/28/2022 1006   CALCIUM 8.6 03/01/2012 1132   PROT 5.9 (L) 05/09/2022 1513   PROT 6.9 03/23/2012 0954   ALBUMIN 3.8 12/30/2021 1308   ALBUMIN 3.7 03/23/2012 0954   AST 19 05/09/2022 1513   AST 25 03/23/2012 0954   ALT 19 05/09/2022 1513   ALT 38 03/23/2012 0954   ALKPHOS 85 12/30/2021 1308   ALKPHOS 130 03/23/2012 0954   BILITOT 0.5 05/09/2022 1513   BILITOT 0.6 03/23/2012 0954   GFR 41.11 (L) 05/28/2022 1006   EGFR 55 (L) 05/09/2022 1513   GFRNONAA 57 (L) 01/10/2022 1040   GFRNONAA >60 03/01/2012 1132     No  results found.     Assessment & Plan:   1. Renal artery stenosis (HCC) Recommend:  The patient has evidence of severe atherosclerotic changes of the renal artery with worsening of the atherosclerosis of the renal arteries associated with severe hypertension.  This represents a high risk for CVA, MI and renal failure.  Patient should undergo angiography of the renal artery with the hope for intervention for control of hypertension.  The risks and benefits as well as the alternative therapies was discussed in detail with the patient.  All questions were answered.  Patient agrees to proceed with angiography.   The patietn will follow up in the office after the angiogram.  2. Mixed hyperlipidemia Continue statin as ordered and reviewed, no changes at this time  3. Stage 3 chronic kidney disease, unspecified whether stage 3a or 3b CKD (HCC) Will make efforts to use as little as possible given patient's chronic kidney disease.   Current Outpatient Medications on File Prior to Visit  Medication Sig Dispense Refill   acetaminophen (TYLENOL) 500 MG tablet Take 1 tablet (500 mg total) by mouth daily as needed for mild pain or headache. 30 tablet 0   albuterol (VENTOLIN HFA) 108 (90 Base) MCG/ACT inhaler SMARTSIG:2 Puff(s) By Mouth Every 4-6 Hours PRN     budesonide-formoterol (SYMBICORT) 80-4.5 MCG/ACT inhaler Inhale 2 puffs into the lungs 2 (two) times daily. 1 each 3   carvedilol (COREG) 25 MG tablet TAKE 1 TABLET BY MOUTH TWICE  DAILY WITH MEALS 180 tablet 3   diltiazem (CARDIZEM CD) 180 MG 24 hr capsule Take 1 capsule (180 mg total) by mouth daily. 30 capsule 11   ELIQUIS 5 MG TABS tablet Take 1 tablet by mouth twice daily 60 tablet 0   famotidine (PEPCID) 20 MG tablet Take 20 mg by mouth 2 (two) times daily.     hydrALAZINE (APRESOLINE) 25 MG tablet Take 1 tablet (25 mg total) by mouth 4 (four) times daily. 120 tablet 11   loratadine (CLARITIN) 10 MG tablet Take 10 mg by mouth daily.       losartan (COZAAR) 50 MG tablet Take 1 tablet (50 mg total) by mouth daily. 90 tablet 1   Multiple Vitamins-Minerals (PRESERVISION AREDS 2) CAPS Take 2 capsules by mouth daily.     rosuvastatin (CRESTOR) 20 MG tablet Take 1 tablet by mouth once daily 90 tablet 3   vitamin C (ASCORBIC ACID) 250 MG tablet Take 250 mg by mouth daily.     Vitamin D, Ergocalciferol, (DRISDOL) 1.25 MG (50000 UNIT) CAPS capsule Take 50,000 Units by mouth every 7 (seven) days.     No current facility-administered medications on file prior to visit.    There are no Patient Instructions on file for this visit. No follow-ups on file.   Georgiana Spinner, NP

## 2022-07-24 ENCOUNTER — Encounter: Payer: Self-pay | Admitting: Family Medicine

## 2022-07-24 ENCOUNTER — Ambulatory Visit: Payer: BC Managed Care – PPO | Admitting: Family Medicine

## 2022-07-24 VITALS — BP 154/82 | HR 63 | Temp 98.3°F | Ht 69.0 in | Wt 209.0 lb

## 2022-07-24 DIAGNOSIS — I10 Essential (primary) hypertension: Secondary | ICD-10-CM | POA: Diagnosis not present

## 2022-07-24 DIAGNOSIS — R600 Localized edema: Secondary | ICD-10-CM | POA: Diagnosis not present

## 2022-07-24 DIAGNOSIS — I701 Atherosclerosis of renal artery: Secondary | ICD-10-CM | POA: Diagnosis not present

## 2022-07-24 NOTE — Progress Notes (Signed)
[name], MD Phone: [phone]  Audrey Wolf is a 72 y.o. female who presents today for f/u.  HYPERTENSION Disease Monitoring Home BP Monitoring reports has been all over the place, up to 188/68 Chest pain- no change    Dyspnea- no change Medications Compliance-  taking hydralazine (dose increased recently by cardiology), coreg, diltiazem, losartan.  Edema- yes, mild, more swelling than usual, no orthopnea or PND Patient was found to have renal artery stenosis on CT angiogram.  She saw vascular and they plan for angiography with stent placement.  She also reports having multiple stress tests and an echo from her cardiologist since my last visit with her.  She notes her cardiologist and nephrologist are both aware of the swelling and she notes one of them advised her to wear compression stockings though she feels as though that makes the swelling worse above the compression stockings.  She notes her swelling sometimes improves overnight. BMET    Component Value Date/Time   NA 142 05/28/2022 1006   NA 144 08/19/2019 1533   NA 140 03/01/2012 1132   K 4.2 05/28/2022 1006   K 3.5 03/01/2012 1132   CL 109 05/28/2022 1006   CL 108 (H) 03/01/2012 1132   CO2 25 05/28/2022 1006   CO2 23 03/01/2012 1132   GLUCOSE 87 05/28/2022 1006   GLUCOSE 92 03/01/2012 1132   BUN 38 (H) 05/28/2022 1006   BUN 20 08/19/2019 1533   BUN 22 (H) 03/01/2012 1132   CREATININE 1.30 (H) 05/28/2022 1006   CREATININE 1.08 (H) 05/09/2022 1513   CALCIUM 8.8 05/28/2022 1006   CALCIUM 8.6 03/01/2012 1132   GFRNONAA 57 (L) 01/10/2022 1040   GFRNONAA >60 03/01/2012 1132   GFRAA 67 08/19/2019 1533   GFRAA >60 03/01/2012 1132     Social History   Tobacco Use  Smoking Status Never  Smokeless Tobacco Never    Current Outpatient Medications on File Prior to Visit  Medication Sig Dispense Refill   acetaminophen (TYLENOL) 500 MG tablet Take 1 tablet (500 mg total) by mouth daily as needed for mild  pain or headache. 30 tablet 0   albuterol (VENTOLIN HFA) 108 (90 Base) MCG/ACT inhaler SMARTSIG:2 Puff(s) By Mouth Every 4-6 Hours PRN     budesonide-formoterol (SYMBICORT) 80-4.5 MCG/ACT inhaler Inhale 2 puffs into the lungs 2 (two) times daily. 1 each 3   carvedilol (COREG) 25 MG tablet TAKE 1 TABLET BY MOUTH TWICE DAILY WITH MEALS 180 tablet 3   diltiazem (CARDIZEM CD) 180 MG 24 hr capsule Take 1 capsule (180 mg total) by mouth daily. 30 capsule 11   ELIQUIS 5 MG TABS tablet Take 1 tablet by mouth twice daily 60 tablet 0   famotidine (PEPCID) 20 MG tablet Take 20 mg by mouth 2 (two) times daily.     hydrALAZINE (APRESOLINE) 25 MG tablet Take 1 tablet (25 mg total) by mouth 4 (four) times daily. 120 tablet 11   loratadine (CLARITIN) 10 MG tablet Take 10 mg by mouth daily.      losartan (COZAAR) 50 MG tablet Take 1 tablet (50 mg total) by mouth daily. 90 tablet 1   Multiple Vitamins-Minerals (PRESERVISION AREDS 2) CAPS Take 2 capsules by mouth daily.     rosuvastatin (CRESTOR) 20 MG tablet Take 1 tablet by mouth once daily 90 tablet 3   vitamin C (ASCORBIC ACID) 250 MG tablet Take 250 mg by mouth daily.     Vitamin D, Ergocalciferol, (DRISDOL) 1.25 MG (50000 UNIT)  CAPS capsule Take 50,000 Units by mouth every 7 (seven) days.     No current facility-administered medications on file prior to visit.     ROS see history of present illness  Objective  Physical Exam Vitals:   07/24/22 1118 07/24/22 1123  BP: (!) 158/88 (!) 154/82  Pulse: 63   Temp: 98.3 F (36.8 C)   SpO2: 97%     BP Readings from Last 3 Encounters:  07/24/22 (!) 154/82  07/22/22 (!) 198/63  07/11/22 (!) 142/60   Wt Readings from Last 3 Encounters:  07/24/22 209 lb (94.8 kg)  07/22/22 207 lb 12.8 oz (94.3 kg)  07/11/22 206 lb (93.4 kg)    Physical Exam Constitutional:      General: She is not in acute distress.    Appearance: She is not diaphoretic.  Cardiovascular:     Rate and Rhythm: Normal rate and  regular rhythm.     Heart sounds: Normal heart sounds.  Pulmonary:     Effort: Pulmonary effort is normal.     Breath sounds: Normal breath sounds.  Musculoskeletal:     Comments: 1+ pitting edema bilateral lower extremities to the mid shin  Skin:    General: Skin is warm and dry.  Neurological:     Mental Status: She is alert.      Assessment/Plan: Please see individual problem list.  Primary hypertension Assessment & Plan: Chronic issue.  Elevated BP could be related to renal artery stenosis.  Will see if this improves with intervention.  Given that her cardiologist recently increased her hydralazine dose we will defer any further changes at this time.  She will continue hydralazine 25 mg 4 times daily, losartan 50 mg daily, diltiazem 180 mg daily, and carvedilol 25 mg twice daily.   Renal artery stenosis [facility]) Assessment & Plan: Patient will continue to follow with vascular surgery for management of this issue.   Bilateral lower extremity edema Assessment & Plan: Possibly venous insufficiency related.  Discussed that her kidney dysfunction could be playing a role as well.  It does not seem to be heart failure related and she does report having had a recent echo.  I encouraged elevation of her legs.  Encouraged compression stockings.      Health Maintenance: patient declines mammogram and colonoscopy at this time given all her other medical issues. Will plan to address again at a future visit.   Return in about 3 months (around 10/24/2022).   [name], MD [facility]

## 2022-07-24 NOTE — Assessment & Plan Note (Signed)
Patient will continue to follow with vascular surgery for management of this issue.

## 2022-07-24 NOTE — Patient Instructions (Signed)
Please make sure you get your stent placement set up.

## 2022-07-24 NOTE — Assessment & Plan Note (Signed)
Possibly venous insufficiency related.  Discussed that her kidney dysfunction could be playing a role as well.  It does not seem to be heart failure related and she does report having had a recent echo.  I encouraged elevation of her legs.  Encouraged compression stockings.

## 2022-07-24 NOTE — Assessment & Plan Note (Signed)
Chronic issue.  Elevated BP could be related to renal artery stenosis.  Will see if this improves with intervention.  Given that her cardiologist recently increased her hydralazine dose we will defer any further changes at this time.  She will continue hydralazine 25 mg 4 times daily, losartan 50 mg daily, diltiazem 180 mg daily, and carvedilol 25 mg twice daily.

## 2022-07-25 ENCOUNTER — Telehealth (INDEPENDENT_AMBULATORY_CARE_PROVIDER_SITE_OTHER): Payer: Self-pay

## 2022-07-25 NOTE — Telephone Encounter (Signed)
Spoke with the patient and she is scheduled with Dr. Wyn Quaker on 07/31/22 with a 1:30 pm arrival to the [facility]. Pre-procedure instructions were discussed and will be sent to Mychart and mailed.

## 2022-07-28 NOTE — Progress Notes (Signed)
Patient called and given new arrival time on 07/31/22 for 9 :00am.  Patient agreeable to arrival time change ok'd by dr. Wyn Quaker

## 2022-07-31 ENCOUNTER — Ambulatory Visit
Admission: RE | Admit: 2022-07-31 | Discharge: 2022-07-31 | Disposition: A | Discharge status: Home / Self Care | Payer: BC Managed Care – PPO | Attending: Vascular Surgery | Admitting: Vascular Surgery

## 2022-07-31 ENCOUNTER — Encounter: Payer: Self-pay | Admitting: Vascular Surgery

## 2022-07-31 ENCOUNTER — Encounter
Admission: RE | Disposition: A | Discharge status: Home / Self Care | Payer: Self-pay | Source: Home / Self Care | Attending: Vascular Surgery

## 2022-07-31 DIAGNOSIS — I701 Atherosclerosis of renal artery: Secondary | ICD-10-CM | POA: Diagnosis not present

## 2022-07-31 DIAGNOSIS — N183 Chronic kidney disease, stage 3 unspecified: Secondary | ICD-10-CM | POA: Insufficient documentation

## 2022-07-31 DIAGNOSIS — I150 Renovascular hypertension: Secondary | ICD-10-CM | POA: Diagnosis not present

## 2022-07-31 DIAGNOSIS — E782 Mixed hyperlipidemia: Secondary | ICD-10-CM | POA: Insufficient documentation

## 2022-07-31 DIAGNOSIS — Z79899 Other long term (current) drug therapy: Secondary | ICD-10-CM | POA: Diagnosis not present

## 2022-07-31 DIAGNOSIS — I509 Heart failure, unspecified: Secondary | ICD-10-CM | POA: Insufficient documentation

## 2022-07-31 DIAGNOSIS — I130 Hypertensive heart and chronic kidney disease with heart failure and stage 1 through stage 4 chronic kidney disease, or unspecified chronic kidney disease: Secondary | ICD-10-CM | POA: Diagnosis not present

## 2022-07-31 HISTORY — PX: RENAL ANGIOGRAPHY: CATH118260

## 2022-07-31 LAB — CREATININE, SERUM
Creatinine, Ser: 1.02 mg/dL — ABNORMAL HIGH (ref 0.44–1.00)
GFR, Estimated: 58 mL/min — ABNORMAL LOW (ref >60)

## 2022-07-31 LAB — BUN: BUN: 25 mg/dL — ABNORMAL HIGH (ref 8–23)

## 2022-07-31 SURGERY — RENAL ANGIOGRAPHY
Anesthesia: Moderate Sedation

## 2022-07-31 MED ORDER — CEFAZOLIN SODIUM-DEXTROSE 2-4 GM/100ML-% IV SOLN
INTRAVENOUS | Status: AC
  Filled 2022-07-31: qty 100

## 2022-07-31 MED ORDER — HYDRALAZINE HCL 20 MG/ML IJ SOLN
INTRAMUSCULAR | Status: DC | PRN
  Administered 2022-07-31: 10 mg via INTRAVENOUS

## 2022-07-31 MED ORDER — ACETAMINOPHEN 325 MG PO TABS: 650.0000 mg | ORAL_TABLET | ORAL | Status: DC | PRN

## 2022-07-31 MED ORDER — SODIUM CHLORIDE 0.9% FLUSH: 3.0000 mL | INTRAVENOUS | Status: DC | PRN

## 2022-07-31 MED ORDER — ONDANSETRON HCL 4 MG/2ML IJ SOLN: 4.0000 mg | Freq: Four times a day (QID) | INTRAMUSCULAR | Status: DC | PRN

## 2022-07-31 MED ORDER — MIDAZOLAM HCL 2 MG/ML PO SYRP: 8.0000 mg | ORAL_SOLUTION | Freq: Once | ORAL | Status: DC | PRN

## 2022-07-31 MED ORDER — METHYLPREDNISOLONE SODIUM SUCC 125 MG IJ SOLR: 125.0000 mg | Freq: Once | INTRAMUSCULAR | Status: DC | PRN

## 2022-07-31 MED ORDER — MIDAZOLAM HCL 2 MG/2ML IJ SOLN
INTRAMUSCULAR | Status: DC | PRN
  Administered 2022-07-31: 2 mg via INTRAVENOUS

## 2022-07-31 MED ORDER — MIDAZOLAM HCL 2 MG/2ML IJ SOLN
INTRAMUSCULAR | Status: AC
  Filled 2022-07-31: qty 2

## 2022-07-31 MED ORDER — SODIUM CHLORIDE 0.9 % IV SOLN: 250.0000 mL | INTRAVENOUS | Status: DC | PRN

## 2022-07-31 MED ORDER — SODIUM CHLORIDE 0.9% FLUSH: 3.0000 mL | Freq: Two times a day (BID) | INTRAVENOUS | Status: DC

## 2022-07-31 MED ORDER — FENTANYL CITRATE (PF) 100 MCG/2ML IJ SOLN
INTRAMUSCULAR | Status: AC
  Filled 2022-07-31: qty 2

## 2022-07-31 MED ORDER — HEPARIN SODIUM (PORCINE) 1000 UNIT/ML IJ SOLN
INTRAMUSCULAR | Status: AC
  Filled 2022-07-31: qty 10

## 2022-07-31 MED ORDER — ASPIRIN 81 MG PO TBEC: 81.0000 mg | DELAYED_RELEASE_TABLET | Freq: Every day | ORAL | 2 refills | Status: DC

## 2022-07-31 MED ORDER — SODIUM CHLORIDE 0.9 % IV SOLN
INTRAVENOUS | Status: DC
  Administered 2022-07-31: 1000 mL via INTRAVENOUS

## 2022-07-31 MED ORDER — DIPHENHYDRAMINE HCL 50 MG/ML IJ SOLN: 50.0000 mg | Freq: Once | INTRAMUSCULAR | Status: DC | PRN

## 2022-07-31 MED ORDER — CEFAZOLIN SODIUM-DEXTROSE 2-4 GM/100ML-% IV SOLN
2.0000 g | INTRAVENOUS | Status: AC
  Administered 2022-07-31: 2 g via INTRAVENOUS

## 2022-07-31 MED ORDER — ASPIRIN 81 MG PO TBEC: 81.0000 mg | DELAYED_RELEASE_TABLET | Freq: Every day | ORAL | Status: DC

## 2022-07-31 MED ORDER — LIDOCAINE-EPINEPHRINE (PF) 1 %-1:200000 IJ SOLN
INTRAMUSCULAR | Status: DC | PRN
  Administered 2022-07-31: 10 mL via INTRADERMAL

## 2022-07-31 MED ORDER — HYDRALAZINE HCL 20 MG/ML IJ SOLN: 5.0000 mg | INTRAMUSCULAR | Status: DC | PRN

## 2022-07-31 MED ORDER — SODIUM CHLORIDE 0.9 % IV SOLN: INTRAVENOUS | Status: AC

## 2022-07-31 MED ORDER — HYDROMORPHONE HCL 1 MG/ML IJ SOLN: 1.0000 mg | Freq: Once | INTRAMUSCULAR | Status: DC | PRN

## 2022-07-31 MED ORDER — LABETALOL HCL 5 MG/ML IV SOLN: 10.0000 mg | INTRAVENOUS | Status: DC | PRN

## 2022-07-31 MED ORDER — FAMOTIDINE 20 MG PO TABS: 40.0000 mg | ORAL_TABLET | Freq: Once | ORAL | Status: DC | PRN

## 2022-07-31 MED ORDER — HYDRALAZINE HCL 20 MG/ML IJ SOLN
INTRAMUSCULAR | Status: AC
  Filled 2022-07-31: qty 1

## 2022-07-31 MED ORDER — FENTANYL CITRATE (PF) 100 MCG/2ML IJ SOLN
INTRAMUSCULAR | Status: DC | PRN
  Administered 2022-07-31 (×2): 50 ug via INTRAVENOUS

## 2022-07-31 SURGICAL SUPPLY — 13 items
CATH ANGIO 5F PIGTAIL 65CM (CATHETERS) IMPLANT
CATH BEACON 5 .035 65 C2 TIP (CATHETERS) IMPLANT
COVER PROBE ULTRASOUND 5X96 (MISCELLANEOUS) IMPLANT
DEVICE STARCLOSE SE CLOSURE (Vascular Products) IMPLANT
KIT ENCORE 26 ADVANTAGE (KITS) IMPLANT
PACK ANGIOGRAPHY (CUSTOM PROCEDURE TRAY) ×1 IMPLANT
SHEATH ANL2 6FRX45 HC (SHEATH) IMPLANT
SHEATH BRITE TIP 5FRX11 (SHEATH) IMPLANT
STENT LIFESTREAM 7X26X80 (Permanent Stent) IMPLANT
SYR MEDRAD MARK 7 150ML (SYRINGE) IMPLANT
TUBING CONTRAST HIGH PRESS 72 (TUBING) IMPLANT
WIRE GUIDERIGHT .035X150 (WIRE) IMPLANT
WIRE MAGIC TORQUE 315CM (WIRE) IMPLANT

## 2022-07-31 NOTE — Interval H&P Note (Signed)
History and Physical Interval Note:  07/31/2022 8:54 AM  Audrey Wolf  has presented today for surgery, with the diagnosis of Renal Angio    Renal artery stenosis.  The various methods of treatment have been discussed with the patient and family. After consideration of risks, benefits and other options for treatment, the patient has consented to  Procedure(s): RENAL ANGIOGRAPHY (N/A) as a surgical intervention.  The patient's history has been reviewed, patient examined, no change in status, stable for surgery.  I have reviewed the patient's chart and labs.  Questions were answered to the patient's satisfaction.     Festus Barren

## 2022-07-31 NOTE — Op Note (Signed)
[facility]  Percutaneous Study/Intervention Procedural Note    Surgeon(s): [employer]   Assistants: None  Pre-operative Diagnosis: Solitary right renal artery stenosis, renovascular hypertension  Post-operative diagnosis:  Same  Procedure(s) Performed:             1.  Ultrasound guidance for vascular access right femoral artery             2.  Catheter placement into right renal artery from right femoral approach             3.  Aortogram and selective right renal angiogram             4.  Balloon expandable stent placement to the right renal artery with a 7 mm diameter x 26 mm length Lifestream stent              5.  StarClose closure device right femoral artery              Contrast: 30 cc  EBL: 10 cc  Fluoro Time: 2.4 minutes  Moderate conscious sedation: Approximately 32 minutes with 2 mg of Versed and 100 mcg of Fentanyl  Indications:  The patient is a 72 year old female with worsening severe hypertension despite multiple medications. The patient has suboptimal blood pressure control despite multiple antihypertensives and a noninvasive study demonstrating hemodynamically significant solitary right renal artery stenosis. Given the clinical scenario and the noninvasive findings, angiogram is indicated for further evaluation of her renal artery and potential treatment. Risks and benefits are discussed and informed consent is obtained.  Procedure:  The patient was identified and appropriate procedural time out was performed.  The patient was then placed supine on the table and prepped and draped in the usual sterile fashion. Moderate conscious sedation was administered with a face to face encounter with the patient throughout the procedure with my supervision of the RN administering medicines and monitoring the patients vital signs and mental status throughout from the start of the procedure until the patient was taken to the recovery room  Ultrasound was used  to evaluate the right common femoral artery.  It was patent .  A digital ultrasound image was acquired.  A Seldinger needle was used to access the right common femoral artery under direct ultrasound guidance and a permanent image was performed.  A 0.035 J wire was advanced without resistance and a 5Fr sheath was placed.  Pigtail catheter was placed into the aorta at the L1 level and an AP aortogram was performed. This demonstrated that the aorta and iliac arteries were widely patent.  There was no left renal artery flow.  The right renal artery was very calcific and had a lesion of clearly greater than 70%. The patient was then systemically heparinized with 4000 units of intravenous heparin. I used a C2 catheter to cannulate the solitary right renal artery and selective imaging was performed. This confirmed a greater than 70% stenosis of the solitary right renal artery.  At this point I selected the Magic torque wire and crossed the lesion without difficulty.  I then upsized to a 6 [place] Ansell sheath and parked this in the proximal right renal artery over the Magic torque wire. I then selected a 7 mm diameter x 26 mm length balloon expandable stent and brought this across the lesion.  This was deployed encompassing the lesion with its proximal extent going back into the aorta for a mm or two.  This was inflated to 14 ATM and  the waist resolved.  Completion angiogram showed excellent flow through the right renal artery stent with less than 10% residual stenosis.  There was an excellent nephrogram. Oblique arteriogram was performed of the right femoral artery and StarClose closure device was deployed in the usual fashion with excellent hemostatic result. The patient was taken to the recovery room in stable condition having tolerated the procedure well.  Findings:               Aortogram/Renal Arteries: Aorta and iliac arteries were widely patent.  No left renal artery.  Right renal artery had a very calcific  lesion of greater than 70% stenosis   Condition:  Stable  Complications: None   Festus Barren 07/31/2022 11:20 AM  This note was created with Dragon Medical transcription system. Any errors in dictation are purely unintentional.

## 2022-07-31 NOTE — Progress Notes (Signed)
Patient was sitting up and beginning to get dressed to go home. Patient began bleeding at her site. Patient was returned to bed and pressure applied by this RN to site. Bleeding contained. Patient was given lido with epi by VAS tech to site. Dressing was applied. Will keep patient flat for one hour and reassess. Patient tolerated well. Given warm blankets and call bell.

## 2022-08-01 ENCOUNTER — Encounter: Payer: Self-pay | Admitting: Vascular Surgery

## 2022-08-26 ENCOUNTER — Encounter: Payer: Self-pay | Admitting: Family Medicine

## 2022-08-26 ENCOUNTER — Ambulatory Visit (INDEPENDENT_AMBULATORY_CARE_PROVIDER_SITE_OTHER): Payer: BC Managed Care – PPO | Admitting: Family Medicine

## 2022-08-26 VITALS — BP 166/84 | HR 64 | Temp 97.8°F | Ht 69.0 in | Wt 210.4 lb

## 2022-08-26 DIAGNOSIS — I10 Essential (primary) hypertension: Secondary | ICD-10-CM

## 2022-08-26 LAB — BASIC METABOLIC PANEL
BUN: 24 mg/dL — ABNORMAL HIGH (ref 6–23)
CO2: 25 mEq/L (ref 19–32)
Calcium: 8.8 mg/dL (ref 8.4–10.5)
Chloride: 111 mEq/L (ref 96–112)
Creatinine, Ser: 1.01 mg/dL (ref 0.40–1.20)
GFR: 55.56 mL/min — ABNORMAL LOW (ref >60.00)
Glucose, Bld: 94 mg/dL (ref 70–99)
Potassium: 4.1 mEq/L (ref 3.5–5.1)
Sodium: 142 mEq/L (ref 135–145)

## 2022-08-26 NOTE — Assessment & Plan Note (Addendum)
Chronic issue with exacerbation of BP.  Patient with significant elevations in blood pressure though also has blood pressures that drop suddenly.  She feels poorly when her blood pressure drops.  I am going to send a message to her cardiologist, nephrologist, and vascular surgeon to get their input on this.  For now she will continue hydralazine 25 mg 4 times daily, losartan 50 mg daily, diltiazem 180 mg daily, and carvedilol 25 mg twice daily.  Discussed we will contact her with her specialists recommendations.  Advised if she does not hear anything in the next day or so she needs to contact us.  Advised to seek medical attention for persistent chest pain, shortness of breath, or if she has a syncopal episode.  She will also seek medical attention if she has persistent lightheadedness.

## 2022-08-26 NOTE — Patient Instructions (Signed)
Nice to see you. I am going to send a message to your specialists and get their input.  We will contact you once I hear back.  If you do not hear back from [place] on this by sometime tomorrow please contact us.

## 2022-08-26 NOTE — Progress Notes (Signed)
[name], MD Phone: [phone]  Audrey Wolf is a 72 y.o. female who presents today for f/u.  Hypertension: Patient had stenting of her right renal artery stenosis.  Since then she has had wide swings in her blood pressure up to the 200s over 80s and then it will drop into the 120s over 70s.  She notes when it dropped she feels sweaty and clammy.  At this time she feels okay.  She notes no chest pain or shortness of breath.  She notes an episode of bad lightheadedness last week that started while she was sitting down after waking up.  She notes she got up and the lightheadedness got worse and she had to go lay down.  This resolved about 15 minutes.  Social History   Tobacco Use  Smoking Status Never  Smokeless Tobacco Never    Current Outpatient Medications on File Prior to Visit  Medication Sig Dispense Refill   acetaminophen (TYLENOL) 500 MG tablet Take 1 tablet (500 mg total) by mouth daily as needed for mild pain or headache. 30 tablet 0   albuterol (VENTOLIN HFA) 108 (90 Base) MCG/ACT inhaler SMARTSIG:2 Puff(s) By Mouth Every 4-6 Hours PRN     aspirin EC 81 MG tablet Take 1 tablet (81 mg total) by mouth daily. Swallow whole. 150 tablet 2   budesonide-formoterol (SYMBICORT) 80-4.5 MCG/ACT inhaler Inhale 2 puffs into the lungs 2 (two) times daily. 1 each 3   carvedilol (COREG) 25 MG tablet TAKE 1 TABLET BY MOUTH TWICE DAILY WITH MEALS 180 tablet 3   diltiazem (CARDIZEM CD) 180 MG 24 hr capsule Take 1 capsule (180 mg total) by mouth daily. 30 capsule 11   ELIQUIS 5 MG TABS tablet Take 1 tablet by mouth twice daily 60 tablet 0   famotidine (PEPCID) 20 MG tablet Take 20 mg by mouth 2 (two) times daily.     hydrALAZINE (APRESOLINE) 25 MG tablet Take 1 tablet (25 mg total) by mouth 4 (four) times daily. 120 tablet 11   loratadine (CLARITIN) 10 MG tablet Take 10 mg by mouth daily.      losartan (COZAAR) 50 MG tablet Take 1 tablet (50 mg total) by mouth daily. 90 tablet 1    Multiple Vitamins-Minerals (PRESERVISION AREDS 2) CAPS Take 2 capsules by mouth daily.     rosuvastatin (CRESTOR) 20 MG tablet Take 1 tablet by mouth once daily 90 tablet 3   vitamin C (ASCORBIC ACID) 250 MG tablet Take 250 mg by mouth daily.     Vitamin D, Ergocalciferol, (DRISDOL) 1.25 MG (50000 UNIT) CAPS capsule Take 50,000 Units by mouth every 7 (seven) days.     No current facility-administered medications on file prior to visit.     ROS see history of present illness  Objective  Physical Exam Vitals:   08/26/22 0839 08/26/22 0858  BP: (!) 182/90 (!) 166/84  Pulse: 64   Temp: 97.8 F (36.6 C)   SpO2: 98%    Lying blood pressure 198/74 pulse 61 Sitting blood pressure 200/76 pulse 60 Standing blood pressure 195/73 pulse 62  BP Readings from Last 3 Encounters:  08/26/22 (!) 166/84  07/31/22 (!) 162/62  07/24/22 (!) 154/82   Wt Readings from Last 3 Encounters:  08/26/22 210 lb 6.4 oz (95.4 kg)  07/31/22 205 lb (93 kg)  07/24/22 209 lb (94.8 kg)    Physical Exam Constitutional:      General: She is not in acute distress.    Appearance: She is  not diaphoretic.  Cardiovascular:     Rate and Rhythm: Normal rate and regular rhythm.     Heart sounds: Normal heart sounds.  Pulmonary:     Effort: Pulmonary effort is normal.     Breath sounds: Normal breath sounds.  Musculoskeletal:     Right lower leg: No edema.     Left lower leg: No edema.  Skin:    General: Skin is warm and dry.  Neurological:     Mental Status: She is alert.      Assessment/Plan: Please see individual problem list.  Primary hypertension Assessment & Plan: Chronic issue with exacerbation of BP.  Patient with significant elevations in blood pressure though also has blood pressures that drop suddenly.  She feels poorly when her blood pressure drops.  I am going to send a message to her cardiologist, nephrologist, and vascular surgeon to get their input on this.  For now she will continue  hydralazine 25 mg 4 times daily, losartan 50 mg daily, diltiazem 180 mg daily, and carvedilol 25 mg twice daily.  Discussed we will contact her with her specialists recommendations.  Advised if she does not hear anything in the next day or so she needs to contact us.  Advised to seek medical attention for persistent chest pain, shortness of breath, or if she has a syncopal episode.  She will also seek medical attention if she has persistent lightheadedness.  Orders: -     Basic metabolic panel  Staff message sent to Ursula Beath, [facility], and [place] regarding patient's blood pressure.  Return in about 2 weeks (around 09/09/2022) for Hypertension.   [name], MD [facility]

## 2022-09-01 ENCOUNTER — Other Ambulatory Visit (INDEPENDENT_AMBULATORY_CARE_PROVIDER_SITE_OTHER): Payer: Self-pay | Admitting: Vascular Surgery

## 2022-09-01 DIAGNOSIS — I701 Atherosclerosis of renal artery: Secondary | ICD-10-CM

## 2022-09-02 ENCOUNTER — Ambulatory Visit (INDEPENDENT_AMBULATORY_CARE_PROVIDER_SITE_OTHER): Payer: BC Managed Care – PPO | Admitting: Nurse Practitioner

## 2022-09-02 ENCOUNTER — Encounter (INDEPENDENT_AMBULATORY_CARE_PROVIDER_SITE_OTHER): Payer: Self-pay | Admitting: Nurse Practitioner

## 2022-09-02 ENCOUNTER — Ambulatory Visit (INDEPENDENT_AMBULATORY_CARE_PROVIDER_SITE_OTHER): Payer: BC Managed Care – PPO

## 2022-09-02 VITALS — BP 176/59 | HR 59 | Resp 18 | Ht 69.0 in | Wt 210.4 lb

## 2022-09-02 DIAGNOSIS — I701 Atherosclerosis of renal artery: Secondary | ICD-10-CM

## 2022-09-02 DIAGNOSIS — E782 Mixed hyperlipidemia: Secondary | ICD-10-CM | POA: Diagnosis not present

## 2022-09-02 DIAGNOSIS — I10 Essential (primary) hypertension: Secondary | ICD-10-CM | POA: Diagnosis not present

## 2022-09-02 NOTE — Progress Notes (Signed)
Subjective:    Patient ID: Audrey Wolf, female    DOB: 08-15-1950, 72 y.o.   MRN: [id number] Chief Complaint  Patient presents with   Follow-up    4 week follow up with Renal    The patient returns to the office for followup and review of the noninvasive studies following lightening artery stent placement on 07/11/2022.  She notes that initially following with the intervention her blood pressure seemed improved with blood pressures ranging from the 120s to 150s systolic however she had an incident 1 day where she became very dizzy and had notable palpitations which resolved itself within about 15 minutes.  Following this incident the patient began to notice subsequently elevated blood pressures and now looking at her blood pressure log these are ranging in the 170 systolic to the 200s systolic.  The patient is on a myriad of blood pressure medications which she takes diligently.  She was prescribed Plavix and aspirin following her intervention and she also takes these diligently.  There have been no significant changes to the patient's overall health care.  No recent shortening of the patient's walking distance or new symptoms consistent with claudication.  No history of rest pain symptoms. No new ulcers or wounds of the lower extremities have occurred.  The patient denies amaurosis fugax or recent TIA symptoms. There are no recent neurological changes noted. There is no history of DVT, PE or superficial thrombophlebitis. No recent episodes of angina or shortness of breath documented.   Duplex ultrasound of the renal arteries reveals worsening stenosis of the right renal artery, worse following recent stent placement.     Review of Systems  Neurological:  Positive for dizziness and light-headedness.  All other systems reviewed and are negative.      Objective:   Physical Exam Vitals reviewed.  HENT:     Head: Normocephalic.  Cardiovascular:     Rate and Rhythm: Normal rate  and regular rhythm.     Pulses: Normal pulses.  Pulmonary:     Effort: Pulmonary effort is normal.  Skin:    General: Skin is warm and dry.  Neurological:     General: No focal deficit present.     Mental Status: She is alert and oriented to person, place, and time. Mental status is at baseline.  Psychiatric:        Mood and Affect: Mood normal.        Behavior: Behavior normal.        Thought Content: Thought content normal.        Judgment: Judgment normal.     BP (!) 176/59 (BP Location: Left Arm)   Pulse (!) 59   Resp 18   Ht 5\' 9"  (1.753 m)   Wt 210 lb 6.4 oz (95.4 kg)   BMI 31.07 kg/m   Past Medical History:  Diagnosis Date   Arthritis    Asthma    CHF (congestive heart failure) (HCC)    Chronic kidney disease    Coronary artery disease    GERD (gastroesophageal reflux disease)    Headache    Hypertension    Nephrolithiasis    NSTEMI (non-ST elevated myocardial infarction) (HCC)    Postprandial RUQ pain 05/25/2019    Social History   Socioeconomic History   Marital status: Single    Spouse name: Not on file   Number of children: Not on file   Years of education: Not on file   Highest education level: Not on file  Occupational History    Employer: [employer]  Tobacco Use   Smoking status: Never   Smokeless tobacco: Never  Vaping Use   Vaping status: Never Used  Substance and Sexual Activity   Alcohol use: No   Drug use: No   Sexual activity: Not Currently    Partners: Male    Birth control/protection: Post-menopausal  Other Topics Concern   Not on file  Social History Narrative   Not on file   Social Determinants of Health   Financial Resource Strain: Low Risk  (10/03/2021)   Overall Financial Resource Strain (CARDIA)    Difficulty of Paying Living Expenses: Not hard at all  Food Insecurity: No Food Insecurity (11/19/2021)   Hunger Vital Sign    Worried About Running Out of Food in the Last Year: Never true    Ran Out of Food in the Last  Year: Never true  Transportation Needs: No Transportation Needs (11/19/2021)   PRAPARE - Administrator, Civil Service (Medical): No    Lack of Transportation (Non-Medical): No  Physical Activity: Sufficiently Active (10/03/2021)   Exercise Vital Sign    Days of Exercise per Week: 5 days    Minutes of Exercise per Session: 30 min  Stress: No Stress Concern Present (10/03/2021)   [name]    Feeling of Stress : Only a little  Social Connections: Unknown (10/03/2021)   Social Connection and Isolation Panel [NHANES]    Frequency of Communication with Friends and Family: More than three times a week    Frequency of Social Gatherings with Friends and Family: More than three times a week    Attends Religious Services: Not on file    Active Member of Clubs or Organizations: Not on file    Attends Banker Meetings: Not on file    Marital Status: Not on file  Intimate Partner Violence: Not At Risk (11/19/2021)   Humiliation, Afraid, Rape, and Kick questionnaire    Fear of Current or Ex-Partner: No    Emotionally Abused: No    Physically Abused: No    Sexually Abused: No    Past Surgical History:  Procedure Laterality Date   ABDOMINAL HYSTERECTOMY     APPENDECTOMY     BREAST BIOPSY Left 12/31/2010   neg   CORONARY ARTERY BYPASS GRAFT N/A 05/22/2017   Procedure: CORONARY ARTERY BYPASS GRAFTING (CABG) x 2 WITH ENDOSCOPIC HARVESTING OF RIGHT SAPHENOUS VEIN;  Surgeon: Delight Ovens, MD;  Location: [facility] OR;  Service: Open Heart Surgery;  Laterality: N/A;   LEFT HEART CATH AND CORONARY ANGIOGRAPHY N/A 05/18/2017   Procedure: LEFT HEART CATH AND CORONARY ANGIOGRAPHY;  Surgeon: Laurier Nancy, MD;  Location: [facility] INVASIVE CV LAB;  Service: Cardiovascular;  Laterality: N/A;   LITHOTRIPSY     RENAL ANGIOGRAPHY N/A 07/31/2022   Procedure: RENAL ANGIOGRAPHY;  Surgeon: Annice Needy, MD;  Location: [facility] INVASIVE  CV LAB;  Service: Cardiovascular;  Laterality: N/A;   TEE WITHOUT CARDIOVERSION N/A 05/22/2017   Procedure: TRANSESOPHAGEAL ECHOCARDIOGRAM (TEE);  Surgeon: Delight Ovens, MD;  Location: [facility] OR;  Service: Open Heart Surgery;  Laterality: N/A;   TONSILLECTOMY      Family History  Problem Relation Age of Onset   Arthritis Mother    Arthritis Father    Heart disease Father    Stroke Father    Sudden Cardiac Death Father    Stroke Son    Breast cancer  Maternal Aunt     Allergies  Allergen Reactions   Lisinopril Cough   Ace Inhibitors Other (See Comments)    Has been told to avoid these and any diuretics because "left kidney has shrunk up and doesn't work"   Baclofen Other (See Comments)    Urinary frequency        Latest Ref Rng & Units 05/09/2022    3:13 PM 01/10/2022   10:40 AM 12/30/2021    1:08 PM  CBC  WBC 3.8 - 10.8 Thousand/uL 5.8  5.3  5.1   Hemoglobin 11.7 - 15.5 g/dL 57.8  46.9  62.9   Hematocrit 35.0 - 45.0 % 36.6  32.1  31.2   Platelets 140 - 400 Thousand/uL 203  220  220.0       CMP     Component Value Date/Time   NA 142 08/26/2022 0857   NA 144 08/19/2019 1533   NA 140 03/01/2012 1132   K 4.1 08/26/2022 0857   K 3.5 03/01/2012 1132   CL 111 08/26/2022 0857   CL 108 (H) 03/01/2012 1132   CO2 25 08/26/2022 0857   CO2 23 03/01/2012 1132   GLUCOSE 94 08/26/2022 0857   GLUCOSE 92 03/01/2012 1132   BUN 24 (H) 08/26/2022 0857   BUN 20 08/19/2019 1533   BUN 22 (H) 03/01/2012 1132   CREATININE 1.01 08/26/2022 0857   CREATININE 1.08 (H) 05/09/2022 1513   CALCIUM 8.8 08/26/2022 0857   CALCIUM 8.6 03/01/2012 1132   PROT 5.9 (L) 05/09/2022 1513   PROT 6.9 03/23/2012 0954   ALBUMIN 3.8 12/30/2021 1308   ALBUMIN 3.7 03/23/2012 0954   AST 19 05/09/2022 1513   AST 25 03/23/2012 0954   ALT 19 05/09/2022 1513   ALT 38 03/23/2012 0954   ALKPHOS 85 12/30/2021 1308   ALKPHOS 130 03/23/2012 0954   BILITOT 0.5 05/09/2022 1513   BILITOT 0.6 03/23/2012 0954    GFR 55.56 (L) 08/26/2022 0857   EGFR 55 (L) 05/09/2022 1513   GFRNONAA 58 (L) 07/31/2022 0917   GFRNONAA >60 03/01/2012 1132     No results found.     Assessment & Plan:   1. Renal artery stenosis (HCC) Recommend:  BP today was not acceptable with systolic reading >528 and diastolic reading >41 while taking 3 medications.  Given that optimal control of the patient's hypertension is important to minimize the risk of heart attack and/or CVA.  The patient's BP and noninvasive studies suggest the possibility of a hemodynamically significant stricture or stenosis.  Angiography was discussed with the patient, the risks and benefits were reviewed and all questions were answered.  The patient has agreed to proceed with angiography and the intention of intervention.     Arrangements will be made to treat the right renal artery.  The patient will continue the current medications, no changes at this time.  The primary medical service will continue aggressive antihypertensive therapy as per the AHA guidelines    2. Mixed hyperlipidemia Continue statin as ordered and reviewed, no changes at this time  3. Essential (primary) hypertension Patient will continue with medications as currently prescribed.   Current Outpatient Medications on File Prior to Visit  Medication Sig Dispense Refill   acetaminophen (TYLENOL) 500 MG tablet Take 1 tablet (500 mg total) by mouth daily as needed for mild pain or headache. 30 tablet 0   albuterol (VENTOLIN HFA) 108 (90 Base) MCG/ACT inhaler SMARTSIG:2 Puff(s) By Mouth Every 4-6 Hours PRN  aspirin EC 81 MG tablet Take 1 tablet (81 mg total) by mouth daily. Swallow whole. 150 tablet 2   budesonide-formoterol (SYMBICORT) 80-4.5 MCG/ACT inhaler Inhale 2 puffs into the lungs 2 (two) times daily. 1 each 3   carvedilol (COREG) 25 MG tablet TAKE 1 TABLET BY MOUTH TWICE DAILY WITH MEALS 180 tablet 3   diltiazem (CARDIZEM CD) 180 MG 24 hr capsule Take 1 capsule  (180 mg total) by mouth daily. 30 capsule 11   ELIQUIS 5 MG TABS tablet Take 1 tablet by mouth twice daily 60 tablet 0   famotidine (PEPCID) 20 MG tablet Take 20 mg by mouth 2 (two) times daily.     hydrALAZINE (APRESOLINE) 25 MG tablet Take 1 tablet (25 mg total) by mouth 4 (four) times daily. 120 tablet 11   loratadine (CLARITIN) 10 MG tablet Take 10 mg by mouth daily.      losartan (COZAAR) 50 MG tablet Take 1 tablet (50 mg total) by mouth daily. 90 tablet 1   Multiple Vitamins-Minerals (PRESERVISION AREDS 2) CAPS Take 2 capsules by mouth daily.     rosuvastatin (CRESTOR) 20 MG tablet Take 1 tablet by mouth once daily 90 tablet 3   vitamin C (ASCORBIC ACID) 250 MG tablet Take 250 mg by mouth daily.     Vitamin D, Ergocalciferol, (DRISDOL) 1.25 MG (50000 UNIT) CAPS capsule Take 50,000 Units by mouth every 7 (seven) days.     No current facility-administered medications on file prior to visit.    There are no Patient Instructions on file for this visit. No follow-ups on file.   Georgiana Spinner, NP

## 2022-09-08 ENCOUNTER — Telehealth: Payer: Self-pay | Admitting: Family Medicine

## 2022-09-08 NOTE — Telephone Encounter (Signed)
-----   Message from [facility] sent at 09/08/2022 12:59 PM EDT ----- Regarding: RE: blood pressure after stenting renal artery can you please let her know i can see her for me tomorrow 9 am, or contact my office if cannot and set up ----- Message ----- From: Glori Luis, MD Sent: 09/08/2022  12:54 PM EDT To: Laurier Nancy, MD Subject: RE: blood pressure after stenting renal arte#  Do we need to contact the patient to let her know this or will your office reach out to her to get the appointment set up? ----- Message ----- From: Laurier Nancy, MD Sent: 09/08/2022  12:22 PM EDT To: [name], MD; Glori Luis, MD; # Subject: RE: blood pressure after stenting renal arte#  i can see patient 9 am tomorrow ----- Message ----- From: Glori Luis, MD Sent: 08/26/2022   9:02 AM EDT To: Laurier Nancy, MD; [name], MD; # Subject: blood pressure after stenting renal artery     Good morning,   I saw Mrs Soble for follow-up today. She noted her BP has been up and down frequently since having her right renal artery stent placed.  She notes blood pressures up into the 200s over 80s that will subsequently drop fairly quickly down into the 120s over 70s.  She gets quite lightheaded with this and feels poorly when this happens.  She reports an episode last week where she woke up from a nap and felt lightheaded before getting up out of her chair and this got worse when she stood up.  That subsequently improved with laying down after about 15 minutes.  She is currently on carvedilol, diltiazem, hydralazine, and losartan.  I wanted to get everyone's input on what direction we should go with her blood pressure medication.  Certainly her blood pressure does need to be lower though I am concerned that if we increase medication she will continue to have blood pressure that drops significantly and causes her to feel poorly.  Please let me know if you all have any thoughts on this.  Thank  you for your help on this.  Minerva Areola

## 2022-09-08 NOTE — Telephone Encounter (Signed)
Spoke to Patient she states that is fine to see Dr. Welton Flakes on 09/09/22 at 9:00.

## 2022-09-08 NOTE — Telephone Encounter (Signed)
Please contact the patient and let her know Dr. Welton Flakes would like to see her tomorrow, 09/09/2022, at 9 AM to follow-up on her blood pressure.  Please let me know if this does not work for the patient.  Thanks.

## 2022-09-09 ENCOUNTER — Encounter: Payer: Self-pay | Admitting: Cardiovascular Disease

## 2022-09-09 ENCOUNTER — Ambulatory Visit (INDEPENDENT_AMBULATORY_CARE_PROVIDER_SITE_OTHER): Payer: BC Managed Care – PPO | Admitting: Cardiovascular Disease

## 2022-09-09 VITALS — BP 160/75 | HR 61 | Ht 69.0 in | Wt 213.6 lb

## 2022-09-09 DIAGNOSIS — I10 Essential (primary) hypertension: Secondary | ICD-10-CM

## 2022-09-09 DIAGNOSIS — N189 Chronic kidney disease, unspecified: Secondary | ICD-10-CM

## 2022-09-09 DIAGNOSIS — I5032 Chronic diastolic (congestive) heart failure: Secondary | ICD-10-CM

## 2022-09-09 DIAGNOSIS — N179 Acute kidney failure, unspecified: Secondary | ICD-10-CM

## 2022-09-09 DIAGNOSIS — I2510 Atherosclerotic heart disease of native coronary artery without angina pectoris: Secondary | ICD-10-CM

## 2022-09-09 DIAGNOSIS — I480 Paroxysmal atrial fibrillation: Secondary | ICD-10-CM

## 2022-09-09 DIAGNOSIS — I701 Atherosclerosis of renal artery: Secondary | ICD-10-CM

## 2022-09-09 MED ORDER — CLOPIDOGREL BISULFATE 75 MG PO TABS
75.0000 mg | ORAL_TABLET | Freq: Every day | ORAL | 11 refills | Status: AC
Start: 2022-09-09 — End: 2023-09-09

## 2022-09-09 NOTE — Progress Notes (Addendum)
Cardiology Office Note   Date:  09/09/2022   ID:  Audrey Wolf, DOB 03/13/1950, MRN [id number]  PCP:  Glori Luis, MD  Cardiologist:  Adrian Blackwater, MD      History of Present Illness: Audrey Wolf is a 72 y.o. female who presents for  Chief Complaint  Patient presents with   Acute Visit    Had renal stents but needs further evaluation and was placed on plavix. BP is labile 200/104, and last night 120 systolic. Feeling not great.      Past Medical History:  Diagnosis Date   Arthritis    Asthma    CHF (congestive heart failure) (HCC)    Chronic kidney disease    Coronary artery disease    GERD (gastroesophageal reflux disease)    Headache    Hypertension    Nephrolithiasis    NSTEMI (non-ST elevated myocardial infarction) (HCC)    Postprandial RUQ pain 05/25/2019     Past Surgical History:  Procedure Laterality Date   ABDOMINAL HYSTERECTOMY     APPENDECTOMY     BREAST BIOPSY Left 12/31/2010   neg   CORONARY ARTERY BYPASS GRAFT N/A 05/22/2017   Procedure: CORONARY ARTERY BYPASS GRAFTING (CABG) x 2 WITH ENDOSCOPIC HARVESTING OF RIGHT SAPHENOUS VEIN;  Surgeon: Delight Ovens, MD;  Location: [facility] OR;  Service: Open Heart Surgery;  Laterality: N/A;   LEFT HEART CATH AND CORONARY ANGIOGRAPHY N/A 05/18/2017   Procedure: LEFT HEART CATH AND CORONARY ANGIOGRAPHY;  Surgeon: Laurier Nancy, MD;  Location: [facility] INVASIVE CV LAB;  Service: Cardiovascular;  Laterality: N/A;   LITHOTRIPSY     RENAL ANGIOGRAPHY N/A 07/31/2022   Procedure: RENAL ANGIOGRAPHY;  Surgeon: Annice Needy, MD;  Location: [facility] INVASIVE CV LAB;  Service: Cardiovascular;  Laterality: N/A;   TEE WITHOUT CARDIOVERSION N/A 05/22/2017   Procedure: TRANSESOPHAGEAL ECHOCARDIOGRAM (TEE);  Surgeon: Delight Ovens, MD;  Location: [facility] OR;  Service: Open Heart Surgery;  Laterality: N/A;   TONSILLECTOMY       Current Outpatient Medications  Medication Sig Dispense Refill   acetaminophen  (TYLENOL) 500 MG tablet Take 1 tablet (500 mg total) by mouth daily as needed for mild pain or headache. 30 tablet 0   albuterol (VENTOLIN HFA) 108 (90 Base) MCG/ACT inhaler SMARTSIG:2 Puff(s) By Mouth Every 4-6 Hours PRN     aspirin EC 81 MG tablet Take 1 tablet (81 mg total) by mouth daily. Swallow whole. 150 tablet 2   budesonide-formoterol (SYMBICORT) 80-4.5 MCG/ACT inhaler Inhale 2 puffs into the lungs 2 (two) times daily. 1 each 3   carvedilol (COREG) 25 MG tablet TAKE 1 TABLET BY MOUTH TWICE DAILY WITH MEALS 180 tablet 3   clopidogrel (PLAVIX) 75 MG tablet Take 1 tablet (75 mg total) by mouth daily. 30 tablet 11   diltiazem (CARDIZEM CD) 180 MG 24 hr capsule Take 1 capsule (180 mg total) by mouth daily. 30 capsule 11   ELIQUIS 5 MG TABS tablet Take 1 tablet by mouth twice daily 60 tablet 0   famotidine (PEPCID) 20 MG tablet Take 20 mg by mouth 2 (two) times daily.     hydrALAZINE (APRESOLINE) 25 MG tablet Take 1 tablet (25 mg total) by mouth 4 (four) times daily. 120 tablet 11   loratadine (CLARITIN) 10 MG tablet Take 10 mg by mouth daily.      losartan (COZAAR) 50 MG tablet Take 1 tablet (50 mg total) by mouth daily. 90 tablet 1  Multiple Vitamins-Minerals (PRESERVISION AREDS 2) CAPS Take 2 capsules by mouth daily.     rosuvastatin (CRESTOR) 20 MG tablet Take 1 tablet by mouth once daily 90 tablet 3   vitamin C (ASCORBIC ACID) 250 MG tablet Take 250 mg by mouth daily.     Vitamin D, Ergocalciferol, (DRISDOL) 1.25 MG (50000 UNIT) CAPS capsule Take 50,000 Units by mouth every 7 (seven) days.     No current facility-administered medications for this visit.    Allergies:   Lisinopril, Ace inhibitors, and Baclofen    Social History:   reports that she has never smoked. She has never used smokeless tobacco. She reports that she does not drink alcohol and does not use drugs.   Family History:  family history includes Arthritis in her father and mother; Breast cancer in her maternal aunt;  Heart disease in her father; Stroke in her father and son; Sudden Cardiac Death in her father.    ROS:     Review of Systems  Constitutional: Negative.   HENT: Negative.    Eyes: Negative.   Respiratory: Negative.    Gastrointestinal: Negative.   Genitourinary: Negative.   Musculoskeletal: Negative.   Skin: Negative.   Neurological: Negative.   Endo/Heme/Allergies: Negative.   Psychiatric/Behavioral: Negative.    All other systems reviewed and are negative.     All other systems are reviewed and negative.    PHYSICAL EXAM: VS:  BP (!) 160/75   Pulse 61   Ht 5\' 9"  (1.753 m)   Wt 213 lb 9.6 oz (96.9 kg)   SpO2 98%   BMI 31.54 kg/m  , BMI Body mass index is 31.54 kg/m. Last weight:  Wt Readings from Last 3 Encounters:  09/09/22 213 lb 9.6 oz (96.9 kg)  09/02/22 210 lb 6.4 oz (95.4 kg)  08/26/22 210 lb 6.4 oz (95.4 kg)     Physical Exam Constitutional:      Appearance: Normal appearance.  Cardiovascular:     Rate and Rhythm: Normal rate and regular rhythm.     Heart sounds: Normal heart sounds.  Pulmonary:     Effort: Pulmonary effort is normal.     Breath sounds: Normal breath sounds.  Musculoskeletal:     Right lower leg: No edema.     Left lower leg: No edema.  Neurological:     Mental Status: She is alert.       EKG:   Recent Labs: 10/29/2021: TSH 5.63 11/22/2021: Magnesium 1.9 01/10/2022: B Natriuretic Peptide 159.3 05/09/2022: ALT 19; Hemoglobin 12.7; Platelets 203 08/26/2022: BUN 24; Creatinine, Ser 1.01; Potassium 4.1; Sodium 142    Lipid Panel    Component Value Date/Time   CHOL 118 05/09/2022 1513   CHOL 153 03/01/2012 1132   TRIG 140 05/09/2022 1513   TRIG 127 03/01/2012 1132   HDL 40 (L) 05/09/2022 1513   HDL 27 (L) 03/01/2012 1132   CHOLHDL 3.0 05/09/2022 1513   VLDL 19.0 01/11/2021 1001   VLDL 25 03/01/2012 1132   LDLCALC 56 05/09/2022 1513   LDLCALC 101 (H) 03/01/2012 1132   LDLDIRECT 64.0 05/17/2019 1058      Other studies  Reviewed: Additional studies/ records that were reviewed today include:  Review of the above records demonstrates:       No data to display            ASSESSMENT AND PLAN:    ICD-10-CM   1. Coronary artery disease involving native coronary artery of native heart without angina  pectoris  I25.10 clopidogrel (PLAVIX) 75 MG tablet    2. Chronic diastolic CHF (congestive heart failure) (HCC)  I50.32 clopidogrel (PLAVIX) 75 MG tablet    3. Primary hypertension  I10 clopidogrel (PLAVIX) 75 MG tablet   Labile blood pressure as velocities are too high in right renal artery and needs angiogram. Will c,Change hyralzine 50 qid for BPover 160 systolic as now 200.    4. Paroxysmal atrial fibrillation (HCC)  I48.0 clopidogrel (PLAVIX) 75 MG tablet    5. Renal artery stenosis (HCC)  I70.1 clopidogrel (PLAVIX) 75 MG tablet   sent plavix as may have restenosis    6. Acute kidney injury superimposed on chronic kidney disease (HCC)  N17.9 clopidogrel (PLAVIX) 75 MG tablet   N18.9        Problem List Items Addressed This Visit       Cardiovascular and Mediastinum   Hypertension (Chronic)   Relevant Medications   clopidogrel (PLAVIX) 75 MG tablet   Paroxysmal atrial fibrillation (HCC) (Chronic)   Relevant Medications   clopidogrel (PLAVIX) 75 MG tablet   CAD (coronary artery disease), native coronary artery - Primary   Relevant Medications   clopidogrel (PLAVIX) 75 MG tablet   Chronic diastolic CHF (congestive heart failure) (HCC)   Relevant Medications   clopidogrel (PLAVIX) 75 MG tablet   Renal artery stenosis (HCC)   Relevant Medications   clopidogrel (PLAVIX) 75 MG tablet     Genitourinary   Acute kidney injury superimposed on chronic kidney disease (HCC)   Relevant Medications   clopidogrel (PLAVIX) 75 MG tablet       Disposition:   Return in about 4 weeks (around 10/07/2022).    Total time spent: 30 minutes  Signed,  Adrian Blackwater, MD  09/09/2022 9:37 AM    [facility]

## 2022-09-15 ENCOUNTER — Encounter: Payer: Self-pay | Admitting: Family Medicine

## 2022-09-15 ENCOUNTER — Ambulatory Visit (INDEPENDENT_AMBULATORY_CARE_PROVIDER_SITE_OTHER): Payer: BC Managed Care – PPO | Admitting: Family Medicine

## 2022-09-15 VITALS — BP 150/82 | HR 54 | Temp 98.3°F | Ht 69.0 in | Wt 208.6 lb

## 2022-09-15 DIAGNOSIS — I10 Essential (primary) hypertension: Secondary | ICD-10-CM | POA: Diagnosis not present

## 2022-09-15 DIAGNOSIS — J4520 Mild intermittent asthma, uncomplicated: Secondary | ICD-10-CM | POA: Diagnosis not present

## 2022-09-15 DIAGNOSIS — I701 Atherosclerosis of renal artery: Secondary | ICD-10-CM | POA: Diagnosis not present

## 2022-09-15 NOTE — Patient Instructions (Signed)
Nice to see you. We start using your Symbicort 2 puffs twice daily.  If this is not beneficial for your cough please let us know.

## 2022-09-15 NOTE — Assessment & Plan Note (Signed)
Chronic issue.  Cardiology placed her on Plavix given possible restenosis.  She was not able to tolerate this.  She will follow-up with cardiology and vascular surgery for this.

## 2022-09-15 NOTE — Assessment & Plan Note (Signed)
Chronic issue.  Suboptimally controlled.  I encouraged her to use her Symbicort 2 puffs twice daily.  Encouraged her to rinse her mouth when she uses this.  She can continue albuterol as needed.  If not improving with the Symbicort she will let us know.

## 2022-09-15 NOTE — Progress Notes (Signed)
[name], MD Phone: [phone]  Audrey Wolf is a 72 y.o. female who presents today for f/u.  HYPERTENSION Disease Monitoring Home BP Monitoring up and down Chest pain- no change to chronic    Dyspnea- no Medications Compliance-  taking hydralazine, coreg, diltiazem, losartan.    Patient saw cardiology recently and they advised her to do hydralazine 50 mg 4 times daily for blood pressure greater than 160 systolic and to do 25 mg 4 times daily if her blood pressure was less than 160 systolic.  Patient has not been doing this and has been taking 50 mg twice daily.  Cardiology also added Plavix.  Patient notes the day she took this every bone in her body hurt and her stomach felt bad and she was quite dizzy.  She notes all of that has progressively improved as she has not taken the Plavix since last Friday.  She also followed up with vascular surgery and they plan for another angiography. BMET    Component Value Date/Time   NA 142 08/26/2022 0857   NA 144 08/19/2019 1533   NA 140 03/01/2012 1132   K 4.1 08/26/2022 0857   K 3.5 03/01/2012 1132   CL 111 08/26/2022 0857   CL 108 (H) 03/01/2012 1132   CO2 25 08/26/2022 0857   CO2 23 03/01/2012 1132   GLUCOSE 94 08/26/2022 0857   GLUCOSE 92 03/01/2012 1132   BUN 24 (H) 08/26/2022 0857   BUN 20 08/19/2019 1533   BUN 22 (H) 03/01/2012 1132   CREATININE 1.01 08/26/2022 0857   CREATININE 1.08 (H) 05/09/2022 1513   CALCIUM 8.8 08/26/2022 0857   CALCIUM 8.6 03/01/2012 1132   GFRNONAA 58 (L) 07/31/2022 0917   GFRNONAA >60 03/01/2012 1132   GFRAA 67 08/19/2019 1533   GFRAA >60 03/01/2012 1132   Asthma: Patient notes intermittent cough for about a week.  She notes this periodically occurs and she will cough for 5 times and then will cough for few days.  She uses her albuterol with good benefit for the cough.  She notes no congestion or production from her cough.  No wheezing or shortness of breath.  No postnasal drip or  rhinorrhea.  No reflux symptoms.  She is not using her Symbicort on a daily basis.  Social History   Tobacco Use  Smoking Status Never  Smokeless Tobacco Never    Current Outpatient Medications on File Prior to Visit  Medication Sig Dispense Refill   acetaminophen (TYLENOL) 500 MG tablet Take 1 tablet (500 mg total) by mouth daily as needed for mild pain or headache. 30 tablet 0   albuterol (VENTOLIN HFA) 108 (90 Base) MCG/ACT inhaler SMARTSIG:2 Puff(s) By Mouth Every 4-6 Hours PRN     aspirin EC 81 MG tablet Take 1 tablet (81 mg total) by mouth daily. Swallow whole. 150 tablet 2   budesonide-formoterol (SYMBICORT) 80-4.5 MCG/ACT inhaler Inhale 2 puffs into the lungs 2 (two) times daily. 1 each 3   carvedilol (COREG) 25 MG tablet TAKE 1 TABLET BY MOUTH TWICE DAILY WITH MEALS 180 tablet 3   clopidogrel (PLAVIX) 75 MG tablet Take 1 tablet (75 mg total) by mouth daily. (Patient not taking: Reported on 09/15/2022) 30 tablet 11   diltiazem (CARDIZEM CD) 180 MG 24 hr capsule Take 1 capsule (180 mg total) by mouth daily. 30 capsule 11   ELIQUIS 5 MG TABS tablet Take 1 tablet by mouth twice daily 60 tablet 0   famotidine (PEPCID) 20 MG  tablet Take 20 mg by mouth 2 (two) times daily.     hydrALAZINE (APRESOLINE) 25 MG tablet Take 1 tablet (25 mg total) by mouth 4 (four) times daily. 120 tablet 11   loratadine (CLARITIN) 10 MG tablet Take 10 mg by mouth daily.      losartan (COZAAR) 50 MG tablet Take 1 tablet (50 mg total) by mouth daily. 90 tablet 1   Multiple Vitamins-Minerals (PRESERVISION AREDS 2) CAPS Take 2 capsules by mouth daily.     rosuvastatin (CRESTOR) 20 MG tablet Take 1 tablet by mouth once daily 90 tablet 3   vitamin C (ASCORBIC ACID) 250 MG tablet Take 250 mg by mouth daily.     Vitamin D, Ergocalciferol, (DRISDOL) 1.25 MG (50000 UNIT) CAPS capsule Take 50,000 Units by mouth every 7 (seven) days.     No current facility-administered medications on file prior to visit.     ROS see  history of present illness  Objective  Physical Exam Vitals:   09/15/22 1049 09/15/22 1106  BP: (!) 160/80 (!) 150/82  Pulse: (!) 54   Temp: 98.3 F (36.8 C)   SpO2: 97%     BP Readings from Last 3 Encounters:  09/15/22 (!) 150/82  09/09/22 (!) 160/75  09/02/22 (!) 176/59   Wt Readings from Last 3 Encounters:  09/15/22 208 lb 9.6 oz (94.6 kg)  09/09/22 213 lb 9.6 oz (96.9 kg)  09/02/22 210 lb 6.4 oz (95.4 kg)    Physical Exam Constitutional:      General: She is not in acute distress.    Appearance: She is not diaphoretic.  Cardiovascular:     Rate and Rhythm: Normal rate and regular rhythm.     Heart sounds: Normal heart sounds.  Pulmonary:     Effort: Pulmonary effort is normal.     Breath sounds: Normal breath sounds.  Skin:    General: Skin is warm and dry.  Neurological:     Mental Status: She is alert.      Assessment/Plan: Please see individual problem list.  Primary hypertension Assessment & Plan: Chronic issue.  Continues to be elevated.  I clarified with her that cardiology wanted her to take hydralazine 25 mg 4 times a day though to increase to 50 mg 4 times a day if her systolic blood pressure was greater than 160.  She will start doing that.  She will continue carvedilol 25 mg twice daily, diltiazem 180 mg daily, and losartan 50 mg daily.  She will continue to follow with cardiology and vascular surgery.   Renal artery stenosis [facility]) Assessment & Plan: Chronic issue.  Cardiology placed her on Plavix given possible restenosis.  She was not able to tolerate this.  She will follow-up with cardiology and vascular surgery for this.   Mild intermittent asthma, unspecified whether complicated Assessment & Plan: Chronic issue.  Suboptimally controlled.  I encouraged her to use her Symbicort 2 puffs twice daily.  Encouraged her to rinse her mouth when she uses this.  She can continue albuterol as needed.  If not improving with the Symbicort she will let us  know.     Return for As scheduled.   [name], MD [facility]

## 2022-09-15 NOTE — Assessment & Plan Note (Signed)
Chronic issue.  Continues to be elevated.  I clarified with her that cardiology wanted her to take hydralazine 25 mg 4 times a day though to increase to 50 mg 4 times a day if her systolic blood pressure was greater than 160.  She will start doing that.  She will continue carvedilol 25 mg twice daily, diltiazem 180 mg daily, and losartan 50 mg daily.  She will continue to follow with cardiology and vascular surgery.

## 2022-09-18 ENCOUNTER — Encounter (INDEPENDENT_AMBULATORY_CARE_PROVIDER_SITE_OTHER): Payer: Self-pay

## 2022-09-19 ENCOUNTER — Telehealth: Payer: Self-pay | Admitting: Student in an Organized Health Care Education/Training Program

## 2022-09-19 ENCOUNTER — Other Ambulatory Visit: Payer: Self-pay

## 2022-09-19 DIAGNOSIS — R0602 Shortness of breath: Secondary | ICD-10-CM

## 2022-09-19 NOTE — Telephone Encounter (Signed)
Patient needs to be scheduled for PFT. Patient scheduled 10/31/2022 with Dr. Aundria Rud. Patient phone number is [phone].

## 2022-09-19 NOTE — Telephone Encounter (Signed)
PFT order has expired. I have asked Foye Clock to place new order

## 2022-09-24 ENCOUNTER — Telehealth (INDEPENDENT_AMBULATORY_CARE_PROVIDER_SITE_OTHER): Payer: Self-pay

## 2022-09-24 NOTE — Telephone Encounter (Signed)
Spoke with the patient and she is scheduled with Dr. Wyn Quaker on 09/0/24 for a right renal angio with a 6:45 am arrival time to the [facility]. Pre-procedure instructions were discussed and will be sent to Mychart and mailed. Patient was also offered 09/29/22 and 10/09/22 but declined.

## 2022-09-24 NOTE — Telephone Encounter (Signed)
I left a message asking the patient to call me back to schedule there may not being any appts left

## 2022-10-01 ENCOUNTER — Telehealth (INDEPENDENT_AMBULATORY_CARE_PROVIDER_SITE_OTHER): Payer: Self-pay

## 2022-10-01 NOTE — Telephone Encounter (Signed)
Patient called with question about upcoming procedure. Can she continue taking the Plavix and How many days should she take of work so she can request off?  Per Vivia Birmingham She can continue to taking her Plavix and maybe a week out of work because it took her longer to recover the last time had the same procedure done.

## 2022-10-03 ENCOUNTER — Other Ambulatory Visit: Payer: Self-pay | Admitting: Family Medicine

## 2022-10-03 DIAGNOSIS — J4530 Mild persistent asthma, uncomplicated: Secondary | ICD-10-CM

## 2022-10-07 ENCOUNTER — Ambulatory Visit (INDEPENDENT_AMBULATORY_CARE_PROVIDER_SITE_OTHER): Payer: BC Managed Care – PPO | Admitting: *Deleted

## 2022-10-07 VITALS — Ht 69.0 in | Wt 210.0 lb

## 2022-10-07 DIAGNOSIS — Z0000 Encounter for general adult medical examination without abnormal findings: Secondary | ICD-10-CM

## 2022-10-07 NOTE — Progress Notes (Signed)
Subjective:   Audrey Wolf is a 72 y.o. female who presents for Medicare Annual (Subsequent) preventive examination.  Visit Complete: Virtual  I connected with  Audrey Wolf on 10/07/22 by a video and audio enabled telemedicine application and verified that I am speaking with the correct person using two identifiers.  Patient Location: Other:  Car  on her lunch  Provider Location: Office/Clinic  I discussed the limitations of evaluation and management by telemedicine. The patient expressed understanding and agreed to proceed.  Patient Medicare AWV questionnaire was completed by the patient on 10/06/22; I have confirmed that all information answered by patient is correct and no changes since this date.  Vital Signs: Unable to obtain new vitals due to this being a telehealth visit.   Review of Systems     Cardiac Risk Factors include: dyslipidemia;hypertension;Other (see comment), Risk factor comments: CAD     Objective:    Today's Vitals   10/07/22 0851  Weight: 210 lb (95.3 kg)  Height: 5\' 9"  (1.753 m)   Body mass index is 31.01 kg/m.     10/07/2022    9:06 AM 11/26/2021    2:22 PM 11/19/2021   12:28 PM 11/17/2021    3:56 PM 11/16/2021    2:58 PM 10/03/2021    9:16 AM 07/29/2021   11:47 PM  Advanced Directives  Does Patient Have a Medical Advance Directive? Yes No No Yes No Yes No  Type of Estate agent of [place];Living will   Healthcare Power of [place];Living will  Healthcare Power of [place];Living will   Does patient want to make changes to medical advance directive?      No - Patient declined   Copy of Healthcare Power of Attorney in Chart? No - copy requested     No - copy requested   Would patient like information on creating a medical advance directive?   No - Patient declined No - Patient declined       Current Medications (verified) Outpatient Encounter Medications as of 10/07/2022  Medication Sig   acetaminophen (TYLENOL)  500 MG tablet Take 1 tablet (500 mg total) by mouth daily as needed for mild pain or headache.   albuterol (VENTOLIN HFA) 108 (90 Base) MCG/ACT inhaler SMARTSIG:2 Puff(s) By Mouth Every 4-6 Hours PRN   aspirin EC 81 MG tablet Take 1 tablet (81 mg total) by mouth daily. Swallow whole.   carvedilol (COREG) 25 MG tablet TAKE 1 TABLET BY MOUTH TWICE DAILY WITH MEALS   clopidogrel (PLAVIX) 75 MG tablet Take 1 tablet (75 mg total) by mouth daily.   diltiazem (CARDIZEM CD) 180 MG 24 hr capsule Take 1 capsule (180 mg total) by mouth daily.   ELIQUIS 5 MG TABS tablet Take 1 tablet by mouth twice daily   famotidine (PEPCID) 20 MG tablet Take 20 mg by mouth 2 (two) times daily.   hydrALAZINE (APRESOLINE) 25 MG tablet Take 1 tablet (25 mg total) by mouth 4 (four) times daily.   loratadine (CLARITIN) 10 MG tablet Take 10 mg by mouth daily.    losartan (COZAAR) 50 MG tablet Take 1 tablet (50 mg total) by mouth daily.   Multiple Vitamins-Minerals (PRESERVISION AREDS 2) CAPS Take 2 capsules by mouth daily.   rosuvastatin (CRESTOR) 20 MG tablet Take 1 tablet by mouth once daily   SYMBICORT 80-4.5 MCG/ACT inhaler Inhale 2 puffs by mouth twice daily   vitamin C (ASCORBIC ACID) 250 MG tablet Take 250 mg by mouth daily.  Vitamin D, Ergocalciferol, (DRISDOL) 1.25 MG (50000 UNIT) CAPS capsule Take 50,000 Units by mouth every 7 (seven) days.   No facility-administered encounter medications on file as of 10/07/2022.    Allergies (verified) Lisinopril, Ace inhibitors, and Baclofen   History: Past Medical History:  Diagnosis Date   Arthritis    Asthma    CHF (congestive heart failure) (HCC)    Chronic kidney disease    Coronary artery disease    GERD (gastroesophageal reflux disease)    Headache    Hypertension    Nephrolithiasis    NSTEMI (non-ST elevated myocardial infarction) (HCC)    Postprandial RUQ pain 05/25/2019   Past Surgical History:  Procedure Laterality Date   ABDOMINAL HYSTERECTOMY      APPENDECTOMY     BREAST BIOPSY Left 12/31/2010   neg   CORONARY ARTERY BYPASS GRAFT N/A 05/22/2017   Procedure: CORONARY ARTERY BYPASS GRAFTING (CABG) x 2 WITH ENDOSCOPIC HARVESTING OF RIGHT SAPHENOUS VEIN;  Surgeon: Delight Ovens, MD;  Location: [facility] OR;  Service: Open Heart Surgery;  Laterality: N/A;   LEFT HEART CATH AND CORONARY ANGIOGRAPHY N/A 05/18/2017   Procedure: LEFT HEART CATH AND CORONARY ANGIOGRAPHY;  Surgeon: Laurier Nancy, MD;  Location: [facility] INVASIVE CV LAB;  Service: Cardiovascular;  Laterality: N/A;   LITHOTRIPSY     RENAL ANGIOGRAPHY N/A 07/31/2022   Procedure: RENAL ANGIOGRAPHY;  Surgeon: Annice Needy, MD;  Location: [facility] INVASIVE CV LAB;  Service: Cardiovascular;  Laterality: N/A;   TEE WITHOUT CARDIOVERSION N/A 05/22/2017   Procedure: TRANSESOPHAGEAL ECHOCARDIOGRAM (TEE);  Surgeon: Delight Ovens, MD;  Location: [facility] OR;  Service: Open Heart Surgery;  Laterality: N/A;   TONSILLECTOMY     Family History  Problem Relation Age of Onset   Arthritis Mother    Arthritis Father    Heart disease Father    Stroke Father    Sudden Cardiac Death Father    Stroke Son    Breast cancer Maternal Aunt    Social History   Socioeconomic History   Marital status: Single    Spouse name: Not on file   Number of children: Not on file   Years of education: Not on file   Highest education level: Not on file  Occupational History    Employer: [employer]  Tobacco Use   Smoking status: Never   Smokeless tobacco: Never  Vaping Use   Vaping status: Never Used  Substance and Sexual Activity   Alcohol use: No   Drug use: No   Sexual activity: Not Currently    Partners: Male    Birth control/protection: Post-menopausal  Other Topics Concern   Not on file  Social History Narrative   Divorced    Social Determinants of Health   Financial Resource Strain: Low Risk  (10/06/2022)   Overall Financial Resource Strain (CARDIA)    Difficulty of Paying Living Expenses: Not very hard   Food Insecurity: No Food Insecurity (10/06/2022)   Hunger Vital Sign    Worried About Running Out of Food in the Last Year: Never true    Ran Out of Food in the Last Year: Never true  Transportation Needs: No Transportation Needs (10/06/2022)   PRAPARE - Administrator, Civil Service (Medical): No    Lack of Transportation (Non-Medical): No  Physical Activity: Insufficiently Active (10/06/2022)   Exercise Vital Sign    Days of Exercise per Week: 2 days    Minutes of Exercise per Session: 30 min  Stress: Stress Concern Present (10/06/2022)   [name]    Feeling of Stress : To some extent  Social Connections: Unknown (10/06/2022)   Social Connection and Isolation Panel [NHANES]    Frequency of Communication with Friends and Family: Twice a week    Frequency of Social Gatherings with Friends and Family: Once a week    Attends Religious Services: Not on Marketing executive or Organizations: No    Attends Banker Meetings: Never    Marital Status: Divorced    Tobacco Counseling Counseling given: Not Answered   Clinical Intake:  Pre-visit preparation completed: Yes  Pain : No/denies pain     BMI - recorded: 31.01 Nutritional Status: BMI > 30  Obese Nutritional Risks: None Diabetes: No  How often do you need to have someone help you when you read instructions, pamphlets, or other written materials from your doctor or pharmacy?: 1 - Never  Interpreter Needed?: No  Information entered by :: R. Alona Danford LPN   Activities of Daily Living    10/06/2022    9:41 AM 07/31/2022    9:12 AM  In your present state of health, do you have any difficulty performing the following activities:  Hearing? 0 0  Vision? 0 0  Difficulty concentrating or making decisions? 0 0  Walking or climbing stairs? 0 0  Dressing or bathing? 0 0  Doing errands, shopping? 0   Preparing Food and eating ? N   Using  the Toilet? N   In the past six months, have you accidently leaked urine? N   Do you have problems with loss of bowel control? N   Managing your Medications? N   Managing your Finances? N   Housekeeping or managing your Housekeeping? N     Patient Care Team: Glori Luis, MD as PCP - General (Family Medicine)  Indicate any recent Medical Services you may have received from other than [facility] providers in the past year (date may be approximate).     Assessment:   This is a routine wellness examination for [facility].  Hearing/Vision screen Hearing Screening - Comments:: No issues Vision Screening - Comments:: glasses  Dietary issues and exercise activities discussed:     Goals Addressed             This Visit's Progress    Patient Stated       Wants to improve her health so that she does not have to see doctors so often        Depression Screen    10/07/2022    9:00 AM 08/26/2022    8:40 AM 07/24/2022   11:19 AM 05/21/2022   10:16 AM 05/09/2022    2:27 PM 02/26/2022    3:18 PM 12/30/2021   12:40 PM  PHQ 2/9 Scores  PHQ - 2 Score 0 0 0 0 0 0 0  PHQ- 9 Score 4 0 0 0 0      Fall Risk    10/06/2022    9:41 AM 08/26/2022    8:40 AM 07/24/2022   11:19 AM 05/21/2022   10:16 AM 05/09/2022    2:26 PM  Fall Risk   Falls in the past year? 1 0 0 0 0  Number falls in past yr: 1 0 0 0 0  Injury with Fall? 1 0 0 0 0  Comment bruising      Risk for fall due to :  History of fall(s);Impaired balance/gait No Fall Risks No Fall Risks No Fall Risks No Fall Risks  Follow up Falls evaluation completed;Education provided;Falls prevention discussed Falls evaluation completed Falls evaluation completed Falls evaluation completed Falls evaluation completed    MEDICARE RISK AT HOME: Medicare Risk at Home Any stairs in or around the home?: No If so, are there any without handrails?: No Home free of loose throw rugs in walkways, pet beds, electrical cords, etc?: Yes Adequate lighting in  your home to reduce risk of falls?: Yes Life alert?: No Use of a cane, walker or w/c?: No Grab bars in the bathroom?: No Shower chair or bench in shower?: No Elevated toilet seat or a handicapped toilet?: No   Cognitive Function:        10/07/2022    9:06 AM 10/03/2021   10:02 AM  6CIT Screen  What Year? 0 points 0 points  What month? 0 points 0 points  What time? 0 points 0 points  Count back from 20 0 points 0 points  Months in reverse 0 points 0 points  Repeat phrase 0 points 0 points  Total Score 0 points 0 points    Immunizations Immunization History  Administered Date(s) Administered   Fluad Quad(high Dose 65+) 10/15/2018, 10/29/2021   Influenza Whole 11/10/2010   Influenza, High Dose Seasonal PF 10/10/2016   Influenza-Unspecified 10/06/2010, 11/08/2019   Pneumococcal Polysaccharide-23 10/15/2018   Pneumococcal-Unspecified 10/06/2007    TDAP status: Due, Education has been provided regarding the importance of this vaccine. Advised may receive this vaccine at local pharmacy or Health Dept. Aware to provide a copy of the vaccination record if obtained from local pharmacy or Health Dept. Verbalized acceptance and understanding.  Flu Vaccine status: Up to date  Pneumococcal vaccine status: Up to date  Covid-19 vaccine status: Declined, Education has been provided regarding the importance of this vaccine but patient still declined. Advised may receive this vaccine at local pharmacy or Health Dept.or vaccine clinic. Aware to provide a copy of the vaccination record if obtained from local pharmacy or Health Dept. Verbalized acceptance and understanding.  Qualifies for Shingles Vaccine? Yes   Zostavax completed No   Shingrix Completed?: No.    Education has been provided regarding the importance of this vaccine. Patient has been advised to call insurance company to determine out of pocket expense if they have not yet received this vaccine. Advised may also receive vaccine at  local pharmacy or Health Dept. Verbalized acceptance and understanding.  Screening Tests Health Maintenance  Topic Date Due   DTaP/Tdap/Td (1 - Tdap) Never done   MAMMOGRAM  12/24/2019   Colonoscopy  02/03/2022   Medicare Annual Wellness (AWV)  10/04/2022   Pneumonia Vaccine 77+ Years old (2 of 2 - PCV) 01/11/2023 (Originally 10/15/2019)   INFLUENZA VACCINE  05/04/2023 (Originally 09/04/2022)   DEXA SCAN  Completed   Hepatitis C Screening  Completed   HPV VACCINES  Aged Out   COVID-19 Vaccine  Discontinued   Zoster Vaccines- Shingrix  Discontinued    Health Maintenance  Health Maintenance Due  Topic Date Due   DTaP/Tdap/Td (1 - Tdap) Never done   MAMMOGRAM  12/24/2019   Colonoscopy  02/03/2022   Medicare Annual Wellness (AWV)  10/04/2022    Colorectal cancer screening: Type of screening: Colonoscopy. Completed 1/14. Repeat every 10 years Declines, will discuss Cologuard with PCP at next visit  Mammogram status: Completed 11/20. Repeat every year Patient declines  Bone Density status: Completed 7/18. Results reflect: Bone  density results: NORMAL. Repeat every 2 years. Patient declines  Lung Cancer Screening: (Low Dose CT Chest recommended if Age 72-80 years, 20 pack-year currently smoking OR have quit w/in 15years.) does not qualify.    Additional Screening:  Hepatitis C Screening: does qualify; Completed 10/23  Vision Screening: Recommended annual ophthalmology exams for early detection of glaucoma and other disorders of the eye. Is the patient up to date with their annual eye exam?  Yes  Who is the provider or what is the name of the office in which the patient attends annual eye exams? [facility] If pt is not established with a provider, would they like to be referred to a provider to establish care? No .   Dental Screening: Recommended annual dental exams for proper oral hygiene   Community Resource Referral / Chronic Care Management: CRR required this visit?  No    CCM required this visit?  No     Plan:     I have personally reviewed and noted the following in the patient's chart:   Medical and social history Use of alcohol, tobacco or illicit drugs  Current medications and supplements including opioid prescriptions. Patient is not currently taking opioid prescriptions. Functional ability and status Nutritional status Physical activity Advanced directives List of other physicians Hospitalizations, surgeries, and ER visits in previous 12 months Vitals Screenings to include cognitive, depression, and falls Referrals and appointments  In addition, I have reviewed and discussed with patient certain preventive protocols, quality metrics, and best practice recommendations. A written personalized care plan for preventive services as well as general preventive health recommendations were provided to patient.     Sydell Axon, LPN   03/10/9561   After Visit Summary: (MyChart) Due to this being a telephonic visit, the after visit summary with patients personalized plan was offered to patient via MyChart   Nurse Notes: None

## 2022-10-07 NOTE — Patient Instructions (Signed)
Audrey Wolf , Thank you for taking time to come for your Medicare Wellness Visit. I appreciate your ongoing commitment to your health goals. Please review the following plan we discussed and let me know if I can assist you in the future.   Referrals/Orders/Follow-Ups/Clinician Recommendations: None  This is a list of the screening recommended for you and due dates:  Health Maintenance  Topic Date Due   DTaP/Tdap/Td vaccine (1 - Tdap) Never done   Mammogram  12/24/2019   Colon Cancer Screening  02/03/2022   Pneumonia Vaccine (2 of 2 - PCV) 01/11/2023*   Flu Shot  05/04/2023*   Medicare Annual Wellness Visit  10/07/2023   DEXA scan (bone density measurement)  Completed   Hepatitis C Screening  Completed   HPV Vaccine  Aged Out   COVID-19 Vaccine  Discontinued   Zoster (Shingles) Vaccine  Discontinued  *Topic was postponed. The date shown is not the original due date.    Advanced directives: (Copy Requested) Please bring a copy of your health care power of attorney and living will to the office to be added to your chart at your convenience.  Next Medicare Annual Wellness Visit scheduled for next year: Yes 10/13/23 @ 9:00

## 2022-10-10 ENCOUNTER — Ambulatory Visit (INDEPENDENT_AMBULATORY_CARE_PROVIDER_SITE_OTHER): Payer: BC Managed Care – PPO | Admitting: Cardiovascular Disease

## 2022-10-10 ENCOUNTER — Encounter: Payer: Self-pay | Admitting: Cardiovascular Disease

## 2022-10-10 VITALS — BP 150/70 | HR 97 | Ht 69.0 in | Wt 214.6 lb

## 2022-10-10 DIAGNOSIS — Z951 Presence of aortocoronary bypass graft: Secondary | ICD-10-CM

## 2022-10-10 DIAGNOSIS — I2510 Atherosclerotic heart disease of native coronary artery without angina pectoris: Secondary | ICD-10-CM | POA: Diagnosis not present

## 2022-10-10 DIAGNOSIS — I480 Paroxysmal atrial fibrillation: Secondary | ICD-10-CM

## 2022-10-10 DIAGNOSIS — I151 Hypertension secondary to other renal disorders: Secondary | ICD-10-CM

## 2022-10-10 DIAGNOSIS — I701 Atherosclerosis of renal artery: Secondary | ICD-10-CM | POA: Diagnosis not present

## 2022-10-10 DIAGNOSIS — I5032 Chronic diastolic (congestive) heart failure: Secondary | ICD-10-CM

## 2022-10-10 NOTE — Progress Notes (Signed)
Cardiology Office Note   Date:  10/10/2022   ID:  Audrey Wolf, DOB 1950/08/29, MRN [id number]  PCP:  Audrey Luis, MD  Cardiologist:  Adrian Blackwater, MD      History of Present Illness: Audrey Wolf is a 72 y.o. female who presents for  Chief Complaint  Patient presents with   Follow-up    3 mo f/u    Stents in kidney next week, BP high      Past Medical History:  Diagnosis Date   Arthritis    Asthma    CHF (congestive heart failure) (HCC)    Chronic kidney disease    Coronary artery disease    GERD (gastroesophageal reflux disease)    Headache    Hypertension    Nephrolithiasis    NSTEMI (non-ST elevated myocardial infarction) (HCC)    Postprandial RUQ pain 05/25/2019     Past Surgical History:  Procedure Laterality Date   ABDOMINAL HYSTERECTOMY     APPENDECTOMY     BREAST BIOPSY Left 12/31/2010   neg   CORONARY ARTERY BYPASS GRAFT N/A 05/22/2017   Procedure: CORONARY ARTERY BYPASS GRAFTING (CABG) x 2 WITH ENDOSCOPIC HARVESTING OF RIGHT SAPHENOUS VEIN;  Surgeon: Audrey Ovens, MD;  Location: [facility] OR;  Service: Open Heart Surgery;  Laterality: N/A;   LEFT HEART CATH AND CORONARY ANGIOGRAPHY N/A 05/18/2017   Procedure: LEFT HEART CATH AND CORONARY ANGIOGRAPHY;  Surgeon: Audrey Nancy, MD;  Location: [facility] INVASIVE CV LAB;  Service: Cardiovascular;  Laterality: N/A;   LITHOTRIPSY     RENAL ANGIOGRAPHY N/A 07/31/2022   Procedure: RENAL ANGIOGRAPHY;  Surgeon: Audrey Needy, MD;  Location: [facility] INVASIVE CV LAB;  Service: Cardiovascular;  Laterality: N/A;   TEE WITHOUT CARDIOVERSION N/A 05/22/2017   Procedure: TRANSESOPHAGEAL ECHOCARDIOGRAM (TEE);  Surgeon: Audrey Ovens, MD;  Location: [facility] OR;  Service: Open Heart Surgery;  Laterality: N/A;   TONSILLECTOMY       Current Outpatient Medications  Medication Sig Dispense Refill   acetaminophen (TYLENOL) 500 MG tablet Take 1 tablet (500 mg total) by mouth daily as needed for mild pain or  headache. 30 tablet 0   albuterol (VENTOLIN HFA) 108 (90 Base) MCG/ACT inhaler SMARTSIG:2 Puff(s) By Mouth Every 4-6 Hours PRN     aspirin EC 81 MG tablet Take 1 tablet (81 mg total) by mouth daily. Swallow whole. 150 tablet 2   carvedilol (COREG) 25 MG tablet TAKE 1 TABLET BY MOUTH TWICE DAILY WITH MEALS 180 tablet 3   clopidogrel (PLAVIX) 75 MG tablet Take 1 tablet (75 mg total) by mouth daily. 30 tablet 11   diltiazem (CARDIZEM CD) 180 MG 24 hr capsule Take 1 capsule (180 mg total) by mouth daily. 30 capsule 11   ELIQUIS 5 MG TABS tablet Take 1 tablet by mouth twice daily 60 tablet 0   famotidine (PEPCID) 20 MG tablet Take 20 mg by mouth 2 (two) times daily.     hydrALAZINE (APRESOLINE) 25 MG tablet Take 1 tablet (25 mg total) by mouth 4 (four) times daily. 120 tablet 11   loratadine (CLARITIN) 10 MG tablet Take 10 mg by mouth daily.      losartan (COZAAR) 50 MG tablet Take 1 tablet (50 mg total) by mouth daily. 90 tablet 1   Multiple Vitamins-Minerals (PRESERVISION AREDS 2) CAPS Take 2 capsules by mouth daily.     rosuvastatin (CRESTOR) 20 MG tablet Take 1 tablet by mouth once daily 90 tablet 3  SYMBICORT 80-4.5 MCG/ACT inhaler Inhale 2 puffs by mouth twice daily 11 g 0   vitamin C (ASCORBIC ACID) 250 MG tablet Take 250 mg by mouth daily.     Vitamin D, Ergocalciferol, (DRISDOL) 1.25 MG (50000 UNIT) CAPS capsule Take 50,000 Units by mouth every 7 (seven) days.     No current facility-administered medications for this visit.    Allergies:   Lisinopril, Ace inhibitors, and Baclofen    Social History:   reports that she has never smoked. She has never used smokeless tobacco. She reports that she does not drink alcohol and does not use drugs.   Family History:  family history includes Arthritis in her father and mother; Breast cancer in her maternal aunt; Heart disease in her father; Stroke in her father and son; Sudden Cardiac Death in her father.    ROS:     Review of Systems   Constitutional: Negative.   HENT: Negative.    Eyes: Negative.   Respiratory: Negative.    Gastrointestinal: Negative.   Genitourinary: Negative.   Musculoskeletal: Negative.   Skin: Negative.   Neurological: Negative.   Endo/Heme/Allergies: Negative.   Psychiatric/Behavioral: Negative.    All other systems reviewed and are negative.     All other systems are reviewed and negative.    PHYSICAL EXAM: VS:  BP (!) 150/70   Pulse 97   Ht 5\' 9"  (1.753 m)   Wt 214 lb 9.6 oz (97.3 kg)   SpO2 97%   BMI 31.69 kg/m  , BMI Body mass index is 31.69 kg/m. Last weight:  Wt Readings from Last 3 Encounters:  10/10/22 214 lb 9.6 oz (97.3 kg)  10/07/22 210 lb (95.3 kg)  09/15/22 208 lb 9.6 oz (94.6 kg)     Physical Exam Constitutional:      Appearance: Normal appearance.  Cardiovascular:     Rate and Rhythm: Normal rate and regular rhythm.     Heart sounds: Normal heart sounds.  Pulmonary:     Effort: Pulmonary effort is normal.     Breath sounds: Normal breath sounds.  Musculoskeletal:     Right lower leg: No edema.     Left lower leg: No edema.  Neurological:     Mental Status: She is alert.       EKG:   Recent Labs: 10/29/2021: TSH 5.63 11/22/2021: Magnesium 1.9 01/10/2022: B Natriuretic Peptide 159.3 05/09/2022: ALT 19; Hemoglobin 12.7; Platelets 203 08/26/2022: BUN 24; Creatinine, Ser 1.01; Potassium 4.1; Sodium 142    Lipid Panel    Component Value Date/Time   CHOL 118 05/09/2022 1513   CHOL 153 03/01/2012 1132   TRIG 140 05/09/2022 1513   TRIG 127 03/01/2012 1132   HDL 40 (L) 05/09/2022 1513   HDL 27 (L) 03/01/2012 1132   CHOLHDL 3.0 05/09/2022 1513   VLDL 19.0 01/11/2021 1001   VLDL 25 03/01/2012 1132   LDLCALC 56 05/09/2022 1513   LDLCALC 101 (H) 03/01/2012 1132   LDLDIRECT 64.0 05/17/2019 1058      Other studies Reviewed: Additional studies/ records that were reviewed today include:  Review of the above records demonstrates:       No data to  display            ASSESSMENT AND PLAN:    ICD-10-CM   1. Coronary artery disease involving native coronary artery of native heart without angina pectoris  I25.10     2. Chronic diastolic CHF (congestive heart failure) (HCC)  I50.32  3. Renal artery stenosis (HCC)  I70.1     4. Paroxysmal atrial fibrillation (HCC)  I48.0     5. S/P CABG x 2  Z95.1     6. Hypertension secondary to other renal disorders  I15.1    BP is high but fluctuating due to renal artery stenosis. Its better than last visit. Having renal artery stenting next week. Advise proceeding with it.       Problem List Items Addressed This Visit       Cardiovascular and Mediastinum   Hypertension (Chronic)   Paroxysmal atrial fibrillation (HCC) (Chronic)   CAD (coronary artery disease), native coronary artery - Primary   Chronic diastolic CHF (congestive heart failure) (HCC)   Renal artery stenosis (HCC)     Other   S/P CABG x 2       Disposition:   Return in about 2 months (around 12/10/2022).    Total time spent: 35 minutes  Signed,  Adrian Blackwater, MD  10/10/2022 10:24 AM    [facility]

## 2022-10-13 ENCOUNTER — Ambulatory Visit
Admission: RE | Admit: 2022-10-13 | Discharge: 2022-10-13 | Disposition: A | Discharge status: Home / Self Care | Payer: BC Managed Care – PPO | Attending: Vascular Surgery | Admitting: Vascular Surgery

## 2022-10-13 ENCOUNTER — Encounter
Admission: RE | Disposition: A | Discharge status: Home / Self Care | Payer: Self-pay | Source: Home / Self Care | Attending: Vascular Surgery

## 2022-10-13 DIAGNOSIS — Z9889 Other specified postprocedural states: Secondary | ICD-10-CM | POA: Diagnosis not present

## 2022-10-13 DIAGNOSIS — I150 Renovascular hypertension: Secondary | ICD-10-CM | POA: Diagnosis not present

## 2022-10-13 DIAGNOSIS — I130 Hypertensive heart and chronic kidney disease with heart failure and stage 1 through stage 4 chronic kidney disease, or unspecified chronic kidney disease: Secondary | ICD-10-CM | POA: Insufficient documentation

## 2022-10-13 DIAGNOSIS — Z79899 Other long term (current) drug therapy: Secondary | ICD-10-CM | POA: Diagnosis not present

## 2022-10-13 DIAGNOSIS — Z8249 Family history of ischemic heart disease and other diseases of the circulatory system: Secondary | ICD-10-CM | POA: Insufficient documentation

## 2022-10-13 DIAGNOSIS — I701 Atherosclerosis of renal artery: Secondary | ICD-10-CM | POA: Insufficient documentation

## 2022-10-13 DIAGNOSIS — I509 Heart failure, unspecified: Secondary | ICD-10-CM | POA: Diagnosis not present

## 2022-10-13 DIAGNOSIS — I252 Old myocardial infarction: Secondary | ICD-10-CM | POA: Insufficient documentation

## 2022-10-13 DIAGNOSIS — Z95828 Presence of other vascular implants and grafts: Secondary | ICD-10-CM

## 2022-10-13 DIAGNOSIS — I2510 Atherosclerotic heart disease of native coronary artery without angina pectoris: Secondary | ICD-10-CM | POA: Insufficient documentation

## 2022-10-13 DIAGNOSIS — N189 Chronic kidney disease, unspecified: Secondary | ICD-10-CM | POA: Insufficient documentation

## 2022-10-13 HISTORY — PX: RENAL ANGIOGRAPHY: CATH118260

## 2022-10-13 LAB — CREATININE, SERUM
Creatinine, Ser: 0.94 mg/dL (ref 0.44–1.00)
GFR, Estimated: >60 mL/min (ref >60)

## 2022-10-13 LAB — BUN: BUN: 24 mg/dL — ABNORMAL HIGH (ref 8–23)

## 2022-10-13 SURGERY — RENAL ANGIOGRAPHY
Anesthesia: Moderate Sedation

## 2022-10-13 MED ORDER — HEPARIN SODIUM (PORCINE) 1000 UNIT/ML IJ SOLN
INTRAMUSCULAR | Status: AC
  Filled 2022-10-13: qty 10

## 2022-10-13 MED ORDER — FENTANYL CITRATE (PF) 100 MCG/2ML IJ SOLN
INTRAMUSCULAR | Status: DC | PRN
  Administered 2022-10-13: 50 ug via INTRAVENOUS
  Administered 2022-10-13: 25 ug via INTRAVENOUS

## 2022-10-13 MED ORDER — FENTANYL CITRATE (PF) 100 MCG/2ML IJ SOLN
INTRAMUSCULAR | Status: AC
  Filled 2022-10-13: qty 2

## 2022-10-13 MED ORDER — LIDOCAINE-EPINEPHRINE (PF) 1 %-1:200000 IJ SOLN
INTRAMUSCULAR | Status: DC | PRN
  Administered 2022-10-13: 10 mL

## 2022-10-13 MED ORDER — MIDAZOLAM HCL 5 MG/5ML IJ SOLN
INTRAMUSCULAR | Status: AC
  Filled 2022-10-13: qty 5

## 2022-10-13 MED ORDER — HEPARIN (PORCINE) IN NACL 2000-0.9 UNIT/L-% IV SOLN
INTRAVENOUS | Status: DC | PRN
  Administered 2022-10-13: 1000 mL

## 2022-10-13 MED ORDER — CEFAZOLIN SODIUM-DEXTROSE 2-4 GM/100ML-% IV SOLN
INTRAVENOUS | Status: AC
  Filled 2022-10-13: qty 100

## 2022-10-13 MED ORDER — SODIUM CHLORIDE 0.9 % IV SOLN: INTRAVENOUS | Status: DC

## 2022-10-13 MED ORDER — MIDAZOLAM HCL 2 MG/2ML IJ SOLN
INTRAMUSCULAR | Status: DC | PRN
  Administered 2022-10-13: .5 mg via INTRAVENOUS
  Administered 2022-10-13: 2 mg via INTRAVENOUS

## 2022-10-13 MED ORDER — IODIXANOL 320 MG/ML IV SOLN
INTRAVENOUS | Status: DC | PRN
  Administered 2022-10-13: 35 mL via INTRA_ARTERIAL

## 2022-10-13 MED ORDER — CEFAZOLIN SODIUM-DEXTROSE 2-4 GM/100ML-% IV SOLN
2.0000 g | INTRAVENOUS | Status: AC
  Administered 2022-10-13: 2 g via INTRAVENOUS

## 2022-10-13 SURGICAL SUPPLY — 12 items
CATH ANGIO 5F PIGTAIL 65CM (CATHETERS) IMPLANT
CATH COBRA TEMPO C2 5X65 (CATHETERS) IMPLANT
COVER PROBE ULTRASOUND 5X96 (MISCELLANEOUS) IMPLANT
DEVICE STARCLOSE SE CLOSURE (Vascular Products) IMPLANT
GLIDEWIRE STIFF .35X180X3 HYDR (WIRE) IMPLANT
GUIDEWIRE AMPLATZ SHORT (WIRE) IMPLANT
PACK ANGIOGRAPHY (CUSTOM PROCEDURE TRAY) ×1 IMPLANT
SHEATH BRITE TIP 4FRX11 (SHEATH) IMPLANT
SHEATH BRITE TIP 5FRX11 (SHEATH) IMPLANT
SYR MEDRAD MARK 7 150ML (SYRINGE) IMPLANT
TUBING CONTRAST HIGH PRESS 72 (TUBING) IMPLANT
WIRE GUIDERIGHT .035X150 (WIRE) IMPLANT

## 2022-10-13 NOTE — H&P (Signed)
[facility] Admission History & Physical  MRN : [id number]  Audrey Wolf is a 72 y.o. (1950-09-02) female who presents with chief complaint of No chief complaint on file. Marland Kitchen  History of Present Illness: Patient presents for renal angiography secondary to persistent elevated velocities with continued hypertension after renal stent placement 3 months ago.  No new complaints.  Current Facility-Administered Medications  Medication Dose Route Frequency Provider Last Rate Last Admin   0.9 %  sodium chloride infusion   Intravenous Continuous Georgiana Spinner, NP 75 mL/hr at 10/13/22 0716 New Bag at 10/13/22 0716   ceFAZolin (ANCEF) IVPB 2g/100 mL premix  2 g Intravenous 30 min Pre-Op Georgiana Spinner, NP 200 mL/hr at 10/13/22 4132 2 g at 10/13/22 4401    Past Medical History:  Diagnosis Date   Arthritis    Asthma    CHF (congestive heart failure) (HCC)    Chronic kidney disease    Coronary artery disease    GERD (gastroesophageal reflux disease)    Headache    Hypertension    Nephrolithiasis    NSTEMI (non-ST elevated myocardial infarction) (HCC)    Postprandial RUQ pain 05/25/2019    Past Surgical History:  Procedure Laterality Date   ABDOMINAL HYSTERECTOMY     APPENDECTOMY     BREAST BIOPSY Left 12/31/2010   neg   CORONARY ARTERY BYPASS GRAFT N/A 05/22/2017   Procedure: CORONARY ARTERY BYPASS GRAFTING (CABG) x 2 WITH ENDOSCOPIC HARVESTING OF RIGHT SAPHENOUS VEIN;  Surgeon: Delight Ovens, MD;  Location: [facility] OR;  Service: Open Heart Surgery;  Laterality: N/A;   LEFT HEART CATH AND CORONARY ANGIOGRAPHY N/A 05/18/2017   Procedure: LEFT HEART CATH AND CORONARY ANGIOGRAPHY;  Surgeon: Laurier Nancy, MD;  Location: [facility] INVASIVE CV LAB;  Service: Cardiovascular;  Laterality: N/A;   LITHOTRIPSY     RENAL ANGIOGRAPHY N/A 07/31/2022   Procedure: RENAL ANGIOGRAPHY;  Surgeon: Annice Needy, MD;  Location: [facility] INVASIVE CV LAB;  Service: Cardiovascular;   Laterality: N/A;   TEE WITHOUT CARDIOVERSION N/A 05/22/2017   Procedure: TRANSESOPHAGEAL ECHOCARDIOGRAM (TEE);  Surgeon: Delight Ovens, MD;  Location: [facility] OR;  Service: Open Heart Surgery;  Laterality: N/A;   TONSILLECTOMY       Social History   Tobacco Use   Smoking status: Never   Smokeless tobacco: Never  Vaping Use   Vaping status: Never Used  Substance Use Topics   Alcohol use: No   Drug use: No     Family History  Problem Relation Age of Onset   Arthritis Mother    Arthritis Father    Heart disease Father    Stroke Father    Sudden Cardiac Death Father    Stroke Son    Breast cancer Maternal Aunt     Allergies  Allergen Reactions   Lisinopril Cough   Ace Inhibitors Other (See Comments)    Has been told to avoid these and any diuretics because "left kidney has shrunk up and doesn't work"   Baclofen Other (See Comments)    Urinary frequency      REVIEW OF SYSTEMS (Negative unless checked)  Constitutional: [] Weight loss  [] Fever  [] Chills Cardiac: [] Chest pain   [] Chest pressure   [] Palpitations   [] Shortness of breath when laying flat   [] Shortness of breath at rest   [] Shortness of breath with exertion. Vascular:  [] Pain in legs with walking   [] Pain in legs at rest   [] Pain in legs  when laying flat   [] Claudication   [] Pain in feet when walking  [] Pain in feet at rest  [] Pain in feet when laying flat   [] History of DVT   [] Phlebitis   [] Swelling in legs   [] Varicose veins   [] Non-healing ulcers Pulmonary:   [] Uses home oxygen   [] Productive cough   [] Hemoptysis   [] Wheeze  [] COPD   [x] Asthma Neurologic:  [] Dizziness  [] Blackouts   [] Seizures   [] History of stroke   [] History of TIA  [] Aphasia   [] Temporary blindness   [] Dysphagia   [] Weakness or numbness in arms   [] Weakness or numbness in legs Musculoskeletal:  [x] Arthritis   [] Joint swelling   [] Joint pain   [] Low back pain Hematologic:  [] Easy bruising  [] Easy bleeding   [] Hypercoagulable state   [] Anemic   [] Hepatitis Gastrointestinal:  [] Blood in stool   [] Vomiting blood  [x] Gastroesophageal reflux/heartburn   [] Difficulty swallowing. Genitourinary:  [] Chronic kidney disease   [] Difficult urination  [] Frequent urination  [] Burning with urination   [] Blood in urine Skin:  [] Rashes   [] Ulcers   [] Wounds Psychological:  [] History of anxiety   []  History of major depression.  Physical Examination  Vitals:   10/13/22 0656  BP: (!) 159/72  Pulse: 64  Resp: 15  Temp: 97.8 F (36.6 C)  TempSrc: Oral  SpO2: 99%  Weight: 95.3 kg  Height: 5\' 9"  (1.753 m)   Body mass index is 31.01 kg/m. Gen: WD/WN, NAD Head: [place]/AT, No temporalis wasting.  Ear/Nose/Throat: Hearing grossly intact, nares w/o erythema or drainage, oropharynx w/o Erythema/Exudate,  Eyes: Conjunctiva clear, sclera non-icteric Neck: Trachea midline.  No JVD.  Pulmonary:  Good air movement, respirations not labored, no use of accessory muscles.  Cardiac: RRR, normal S1, S2. Vascular:  Vessel Right Left  Radial Palpable Palpable               Musculoskeletal: M/S 5/5 throughout.  Extremities without ischemic changes.  No deformity or atrophy.  Neurologic: Sensation grossly intact in extremities.  Symmetrical.  Speech is fluent. Motor exam as listed above. Psychiatric: Judgment intact, Mood & affect appropriate for pt's clinical situation. Dermatologic: No rashes or ulcers noted.  No cellulitis or open wounds. Lymph : No Cervical, Axillary, or Inguinal lymphadenopathy.     CBC Lab Results  Component Value Date   WBC 5.8 05/09/2022   HGB 12.7 05/09/2022   HCT 36.6 05/09/2022   MCV 91.7 05/09/2022   PLT 203 05/09/2022    BMET    Component Value Date/Time   NA 142 08/26/2022 0857   NA 144 08/19/2019 1533   NA 140 03/01/2012 1132   K 4.1 08/26/2022 0857   K 3.5 03/01/2012 1132   CL 111 08/26/2022 0857   CL 108 (H) 03/01/2012 1132   CO2 25 08/26/2022 0857   CO2 23 03/01/2012 1132   GLUCOSE 94 08/26/2022 0857    GLUCOSE 92 03/01/2012 1132   BUN 24 (H) 10/13/2022 0706   BUN 20 08/19/2019 1533   BUN 22 (H) 03/01/2012 1132   CREATININE 0.94 10/13/2022 0706   CREATININE 1.08 (H) 05/09/2022 1513   CALCIUM 8.8 08/26/2022 0857   CALCIUM 8.6 03/01/2012 1132   GFRNONAA >60 10/13/2022 0706   GFRNONAA >60 03/01/2012 1132   GFRAA 67 08/19/2019 1533   GFRAA >60 03/01/2012 1132   Estimated Creatinine Clearance: 66.4 mL/min (by C-G formula based on SCr of 0.94 mg/dL).  COAG Lab Results  Component Value Date   INR 3.0 (H)  11/18/2021   INR 2.0 (H) 11/17/2021   INR 1.31 05/22/2017    Radiology No results found.   Assessment/Plan 1.  Renal artery stenosis.  Status post intervention earlier this year but her elevated velocities on duplex leave concern for persistent stenosis creating hypertension.  For angiogram today 2.  Severe hypertension.  Status post renal artery intervention without significant improvement.  Further evaluation with angiogram today. 3.  Coronary disease.  Being followed by cardiology.   Festus Barren, MD  10/13/2022 8:11 AM

## 2022-10-13 NOTE — Progress Notes (Signed)
Dr. Wyn Quaker in at bedside, speaking with pt. And her sister re: procedural results. Both pt. And sister verbalized understanding of conversation.

## 2022-10-13 NOTE — Group Note (Deleted)

## 2022-10-13 NOTE — Op Note (Signed)
[facility]  Percutaneous Study/Intervention Procedural Note    Surgeon(s): [employer]   Assistants: none  Pre-operative Diagnosis: Recurrent right renal artery stenosis, renovascular hypertension  Post-operative diagnosis: Renovascular hypertension, previously placed stent was widely patent.  Procedure(s) Performed:             1.  Ultrasound guidance for vascular access right femoral artery             2.  Catheter placement into right renal artery from right femoral approach             3.  Aortogram and selective right renal angiogram             4.  StarClose closure device right femoral artery              Contrast: 35 cc  EBL: 10 cc  Fluroro Time: 1 minute  Moderate Conscious sedation time: Approximately 18 minutes using 2.5 mg of Versed and 75 mcg of Fentanyl  Indications:  The patient is a 72 y.o.female with worsening severe hypertension despite multiple medications and a previous renal artery stent earlier this year. The patient has suboptimal blood pressure control despite multiple antihypertensives and a noninvasive study demonstrating elevated velocities in the right renal artery consistent with hemodynamically significant recurrent right renal artery stenosis.  Given the clinical scenario and the noninvasive findings, angiogram is indicated for further evaluation of her renal artery and potential treatment. Risks and benefits are discussed and informed consent is obtained.  Procedure:  The patient was identified and appropriate procedural time out was performed.  The patient was then placed supine on the table and prepped and draped in the usual sterile fashion. Moderate conscious sedation was administered with a face to face encounter with the patient throughout the procedure with my supervision of the RN administering medicines and monitoring the patients vital signs and mental status throughout from the start of the procedure until the patient was  taken to the recovery room. Ultrasound was used to evaluate the right common femoral artery.  It was patent .  A digital ultrasound image was acquired.  A Seldinger needle was used to access the right common femoral artery under direct ultrasound guidance and a permanent image was performed.  A 0.035 J wire was advanced without resistance and a 5Fr sheath was placed.  Pigtail catheter was placed into the aorta at the L1 level and an AP aortogram was performed. This demonstrated normal aorta and iliac arteries without significant stenosis and what appeared to be a patent right renal artery stent initially.  I used a C2 catheter to cannulate the right renal artery and selective imaging was performed.  I did injections in the AP, LAO, and RAO projections to fully evaluate the right renal artery and previously placed stent.  The stent was widely patent and there is no significant narrowing proximal or distal to the stent.  There was a normal nephrogram.   The diagnostic catheter was removed. Oblique arteriogram was performed of the right femoral artery and StarClose closure device was deployed in the usual fashion with excellent hemostatic result. The patient was taken to the recovery room in stable condition having tolerated the procedure well.  Findings:               Aortogram/Renal Arteries: Normal aorta and iliac arteries without significant stenosis.  Right renal artery stent appeared patent on the initial aortogram.  It was then cannulated with a C2 catheter  where selective imaging was performed in the AP, LAO, and RAO projections to evaluate the right renal artery and stent.  The stent was widely patent and there was no significant narrowing proximal or distal to the stent with normal nephrogram.   Condition:  Stable  Complications: None   Festus Barren 10/13/2022 8:45 AM   This note was created with Dragon Medical transcription system. Any errors in dictation are purely unintentional.

## 2022-10-14 ENCOUNTER — Encounter: Payer: Self-pay | Admitting: Vascular Surgery

## 2022-10-17 ENCOUNTER — Encounter: Payer: Self-pay | Admitting: Vascular Surgery

## 2022-10-21 NOTE — Telephone Encounter (Signed)
I left message again today about scheduling her PFT

## 2022-10-24 ENCOUNTER — Ambulatory Visit (INDEPENDENT_AMBULATORY_CARE_PROVIDER_SITE_OTHER): Payer: BC Managed Care – PPO | Admitting: Family Medicine

## 2022-10-24 ENCOUNTER — Encounter: Payer: Self-pay | Admitting: Family Medicine

## 2022-10-24 VITALS — BP 158/78 | HR 65 | Temp 98.5°F | Ht 69.0 in | Wt 215.6 lb

## 2022-10-24 DIAGNOSIS — I10 Essential (primary) hypertension: Secondary | ICD-10-CM

## 2022-10-24 DIAGNOSIS — I480 Paroxysmal atrial fibrillation: Secondary | ICD-10-CM | POA: Diagnosis not present

## 2022-10-24 DIAGNOSIS — J4520 Mild intermittent asthma, uncomplicated: Secondary | ICD-10-CM

## 2022-10-24 DIAGNOSIS — G4710 Hypersomnia, unspecified: Secondary | ICD-10-CM | POA: Diagnosis not present

## 2022-10-24 NOTE — Assessment & Plan Note (Signed)
Chronic issue.  Sinus rhythm today.  She will continue to follow with cardiology for this.  She will monitor palpitations and if they are long-lasting she will seek medical attention the emergency room.  She will continue Eliquis 5 mg twice daily, carvedilol 25 mg twice daily, and diltiazem 180 mg daily.

## 2022-10-24 NOTE — Assessment & Plan Note (Signed)
Chronic issue.  Patient is having trouble affording her Symbicort.  Referral to clinical pharmacist placed.

## 2022-10-24 NOTE — Progress Notes (Signed)
[name], MD Phone: [phone]  Audrey Wolf is a 72 y.o. female who presents today for follow-up.  Hypertension: Generally 150s-160s/60s.  She is taking carvedilol, hydralazine, and losartan.  No chest pain or shortness of breath.  Has occasional lower extremity edema that resolves overnight.  No orthopnea or PND.  She does wear compression stockings.  She had a recent angiogram that revealed patent renal artery stent.  Hypersomnia: This has been going on for some time.  She is sleepy.  She notes at times she could just fall asleep while sitting there.  Palpitations: This is a intermittent ongoing issue.  She notes it occurs once weekly.  Lasts for 5 to 8 minutes and resolves on its own.  Patient notes cardiology is aware of this issue.  Patient notes she is having trouble affording her Symbicort.  Social History   Tobacco Use  Smoking Status Never  Smokeless Tobacco Never    Current Outpatient Medications on File Prior to Visit  Medication Sig Dispense Refill   acetaminophen (TYLENOL) 500 MG tablet Take 1 tablet (500 mg total) by mouth daily as needed for mild pain or headache. 30 tablet 0   albuterol (VENTOLIN HFA) 108 (90 Base) MCG/ACT inhaler SMARTSIG:2 Puff(s) By Mouth Every 4-6 Hours PRN     aspirin EC 81 MG tablet Take 1 tablet (81 mg total) by mouth daily. Swallow whole. 150 tablet 2   carvedilol (COREG) 25 MG tablet TAKE 1 TABLET BY MOUTH TWICE DAILY WITH MEALS 180 tablet 3   clopidogrel (PLAVIX) 75 MG tablet Take 1 tablet (75 mg total) by mouth daily. 30 tablet 11   diltiazem (CARDIZEM CD) 180 MG 24 hr capsule Take 1 capsule (180 mg total) by mouth daily. 30 capsule 11   ELIQUIS 5 MG TABS tablet Take 1 tablet by mouth twice daily 60 tablet 0   famotidine (PEPCID) 20 MG tablet Take 20 mg by mouth 2 (two) times daily.     hydrALAZINE (APRESOLINE) 25 MG tablet Take 1 tablet (25 mg total) by mouth 4 (four) times daily. 120 tablet 11   loratadine (CLARITIN) 10 MG  tablet Take 10 mg by mouth daily.      losartan (COZAAR) 50 MG tablet Take 1 tablet (50 mg total) by mouth daily. 90 tablet 1   Multiple Vitamins-Minerals (PRESERVISION AREDS 2) CAPS Take 2 capsules by mouth daily.     rosuvastatin (CRESTOR) 20 MG tablet Take 1 tablet by mouth once daily 90 tablet 3   SYMBICORT 80-4.5 MCG/ACT inhaler Inhale 2 puffs by mouth twice daily 11 g 0   vitamin C (ASCORBIC ACID) 250 MG tablet Take 250 mg by mouth daily.     Vitamin D, Ergocalciferol, (DRISDOL) 1.25 MG (50000 UNIT) CAPS capsule Take 50,000 Units by mouth every 7 (seven) days.     No current facility-administered medications on file prior to visit.     ROS see history of present illness  Objective  Physical Exam Vitals:   10/24/22 0938 10/24/22 1018  BP: (!) 152/86 (!) 158/78  Pulse: 65   Temp: 98.5 F (36.9 C)   SpO2: 98%     BP Readings from Last 3 Encounters:  10/24/22 (!) 158/78  10/13/22 (!) 177/65  10/10/22 (!) 150/70   Wt Readings from Last 3 Encounters:  10/24/22 215 lb 9.6 oz (97.8 kg)  10/13/22 210 lb (95.3 kg)  10/10/22 214 lb 9.6 oz (97.3 kg)    Physical Exam Constitutional:  General: She is not in acute distress.    Appearance: She is not diaphoretic.  Cardiovascular:     Rate and Rhythm: Normal rate and regular rhythm.     Heart sounds: Normal heart sounds.  Pulmonary:     Effort: Pulmonary effort is normal.     Breath sounds: Normal breath sounds.  Skin:    General: Skin is warm and dry.  Neurological:     Mental Status: She is alert.    Epworth Sleepiness Scale score of 5 with individual scores of 1, 2, 0, 0, 1, 0, 1, 0  Assessment/Plan: Please see individual problem list.  Primary hypertension Assessment & Plan: Chronic issue.  Control seems to have improved some.  Still above goal though her diastolics at home limit further titration of medication.  She will continue hydralazine 25 mg 4 times daily, carvedilol 25 mg twice daily, diltiazem 180 mg  daily, and losartan 50 mg daily.  She will continue to follow with cardiology for this as well.   Hypersomnia Assessment & Plan: Chronic issue.  Concerning for sleep apnea.  Home sleep study ordered.  Orders: -     PSG SLEEP STUDY; Future  Mild intermittent asthma, unspecified whether complicated Assessment & Plan: Chronic issue.  Patient is having trouble affording her Symbicort.  Referral to clinical pharmacist placed.   Paroxysmal atrial fibrillation (HCC) Assessment & Plan: Chronic issue.  Sinus rhythm today.  She will continue to follow with cardiology for this.  She will monitor palpitations and if they are long-lasting she will seek medical attention the emergency room.  She will continue Eliquis 5 mg twice daily, carvedilol 25 mg twice daily, and diltiazem 180 mg daily.     Return in about 3 months (around 01/23/2023).   [name], MD [facility]

## 2022-10-24 NOTE — Assessment & Plan Note (Signed)
Chronic issue.  Control seems to have improved some.  Still above goal though her diastolics at home limit further titration of medication.  She will continue hydralazine 25 mg 4 times daily, carvedilol 25 mg twice daily, diltiazem 180 mg daily, and losartan 50 mg daily.  She will continue to follow with cardiology for this as well.

## 2022-10-24 NOTE — Assessment & Plan Note (Signed)
Chronic issue.  Concerning for sleep apnea.  Home sleep study ordered.

## 2022-10-30 ENCOUNTER — Other Ambulatory Visit: Payer: Self-pay | Admitting: Cardiovascular Disease

## 2022-10-30 NOTE — Telephone Encounter (Signed)
I have spoke with Audrey Wolf and her PFT has been scheduled on 11/25/22 @ 3:00pm at [facility] Entrance

## 2022-10-30 NOTE — Telephone Encounter (Signed)
I have left another message asking the patient to call and schedule her PFT now that her appt has been moved out

## 2022-10-31 ENCOUNTER — Ambulatory Visit: Payer: BC Managed Care – PPO | Admitting: Student in an Organized Health Care Education/Training Program

## 2022-11-09 ENCOUNTER — Other Ambulatory Visit: Payer: Self-pay | Admitting: Family Medicine

## 2022-11-09 DIAGNOSIS — I151 Hypertension secondary to other renal disorders: Secondary | ICD-10-CM

## 2022-11-10 ENCOUNTER — Ambulatory Visit (INDEPENDENT_AMBULATORY_CARE_PROVIDER_SITE_OTHER): Payer: BC Managed Care – PPO | Admitting: Cardiovascular Disease

## 2022-11-10 ENCOUNTER — Encounter: Payer: Self-pay | Admitting: Cardiovascular Disease

## 2022-11-10 VITALS — BP 150/70 | HR 70 | Ht 69.0 in | Wt 221.2 lb

## 2022-11-10 DIAGNOSIS — I151 Hypertension secondary to other renal disorders: Secondary | ICD-10-CM

## 2022-11-10 DIAGNOSIS — Z951 Presence of aortocoronary bypass graft: Secondary | ICD-10-CM

## 2022-11-10 DIAGNOSIS — R0789 Other chest pain: Secondary | ICD-10-CM

## 2022-11-10 DIAGNOSIS — I480 Paroxysmal atrial fibrillation: Secondary | ICD-10-CM | POA: Diagnosis not present

## 2022-11-10 DIAGNOSIS — I5032 Chronic diastolic (congestive) heart failure: Secondary | ICD-10-CM | POA: Diagnosis not present

## 2022-11-10 DIAGNOSIS — R55 Syncope and collapse: Secondary | ICD-10-CM

## 2022-11-10 MED ORDER — LOSARTAN POTASSIUM 100 MG PO TABS
100.0000 mg | ORAL_TABLET | Freq: Every day | ORAL | 11 refills | Status: DC
Start: 2022-11-10 — End: 2023-12-14

## 2022-11-10 NOTE — Progress Notes (Signed)
Cardiology Office Note   Date:  11/10/2022   ID:  Audrey Wolf, DOB February 09, 1950, MRN [id number]  PCP:  Audrey Luis, MD  Cardiologist:  Audrey Blackwater, MD      History of Present Illness: Audrey Wolf is a 72 y.o. female who presents for  Chief Complaint  Patient presents with   Follow-up    1 mo f/u    BP HIGH, HAD PRESYNCOPE TWICE A WEEK  Dizziness This is a recurrent problem. The current episode started more than 1 month ago. The problem has been waxing and waning.      Past Medical History:  Diagnosis Date   Arthritis    Asthma    CHF (congestive heart failure) (HCC)    Chronic kidney disease    Coronary artery disease    GERD (gastroesophageal reflux disease)    Headache    Hypertension    Nephrolithiasis    NSTEMI (non-ST elevated myocardial infarction) (HCC)    Postprandial RUQ pain 05/25/2019     Past Surgical History:  Procedure Laterality Date   ABDOMINAL HYSTERECTOMY     APPENDECTOMY     BREAST BIOPSY Left 12/31/2010   neg   CORONARY ARTERY BYPASS GRAFT N/A 05/22/2017   Procedure: CORONARY ARTERY BYPASS GRAFTING (CABG) x 2 WITH ENDOSCOPIC HARVESTING OF RIGHT SAPHENOUS VEIN;  Surgeon: Delight Ovens, MD;  Location: [facility] OR;  Service: Open Heart Surgery;  Laterality: N/A;   LEFT HEART CATH AND CORONARY ANGIOGRAPHY N/A 05/18/2017   Procedure: LEFT HEART CATH AND CORONARY ANGIOGRAPHY;  Surgeon: Laurier Nancy, MD;  Location: [facility];  Service: Cardiovascular;  Laterality: N/A;   LITHOTRIPSY     RENAL ANGIOGRAPHY N/A 07/31/2022   Procedure: RENAL ANGIOGRAPHY;  Surgeon: Annice Needy, MD;  Location: [facility];  Service: Cardiovascular;  Laterality: N/A;   RENAL ANGIOGRAPHY N/A 10/13/2022   Procedure: RENAL ANGIOGRAPHY;  Surgeon: Annice Needy, MD;  Location: [facility];  Service: Cardiovascular;  Laterality: N/A;   TEE WITHOUT CARDIOVERSION N/A 05/22/2017   Procedure: TRANSESOPHAGEAL ECHOCARDIOGRAM (TEE);   Surgeon: Delight Ovens, MD;  Location: [facility] OR;  Service: Open Heart Surgery;  Laterality: N/A;   TONSILLECTOMY       Current Outpatient Medications  Medication Sig Dispense Refill   losartan (COZAAR) 100 MG tablet Take 1 tablet (100 mg total) by mouth daily. 30 tablet 11   acetaminophen (TYLENOL) 500 MG tablet Take 1 tablet (500 mg total) by mouth daily as needed for mild pain or headache. 30 tablet 0   albuterol (VENTOLIN HFA) 108 (90 Base) MCG/ACT inhaler SMARTSIG:2 Puff(s) By Mouth Every 4-6 Hours PRN     carvedilol (COREG) 25 MG tablet TAKE 1 TABLET BY MOUTH TWICE DAILY WITH MEALS 180 tablet 3   clopidogrel (PLAVIX) 75 MG tablet Take 1 tablet (75 mg total) by mouth daily. 30 tablet 11   diltiazem (CARDIZEM CD) 180 MG 24 hr capsule Take 1 capsule (180 mg total) by mouth daily. 30 capsule 11   ELIQUIS 5 MG TABS tablet Take 1 tablet by mouth twice daily 60 tablet 0   famotidine (PEPCID) 20 MG tablet Take 20 mg by mouth 2 (two) times daily.     hydrALAZINE (APRESOLINE) 25 MG tablet Take 1 tablet (25 mg total) by mouth 4 (four) times daily. 120 tablet 11   loratadine (CLARITIN) 10 MG tablet Take 10 mg by mouth daily.      Multiple  Vitamins-Minerals (PRESERVISION AREDS 2) CAPS Take 2 capsules by mouth daily.     rosuvastatin (CRESTOR) 20 MG tablet Take 1 tablet by mouth once daily 90 tablet 3   SYMBICORT 80-4.5 MCG/ACT inhaler Inhale 2 puffs by mouth twice daily 11 g 0   vitamin C (ASCORBIC ACID) 250 MG tablet Take 250 mg by mouth daily.     Vitamin D, Ergocalciferol, (DRISDOL) 1.25 MG (50000 UNIT) CAPS capsule Take 50,000 Units by mouth every 7 (seven) days.     No current facility-administered medications for this visit.    Allergies:   Lisinopril, Ace inhibitors, and Baclofen    Social History:   reports that she has never smoked. She has never used smokeless tobacco. She reports that she does not drink alcohol and does not use drugs.   Family History:  family history includes  Arthritis in her father and mother; Breast cancer in her maternal aunt; Heart disease in her father; Stroke in her father and son; Sudden Cardiac Death in her father.    ROS:     Review of Systems  Constitutional: Negative.   HENT: Negative.    Eyes: Negative.   Respiratory: Negative.    Gastrointestinal: Negative.   Genitourinary: Negative.   Musculoskeletal: Negative.   Skin: Negative.   Neurological:  Positive for dizziness.  Endo/Heme/Allergies: Negative.   Psychiatric/Behavioral: Negative.    All other systems reviewed and are negative.     All other systems are reviewed and negative.    PHYSICAL EXAM: VS:  BP (!) 150/70   Pulse 70   Ht 5\' 9"  (1.753 m)   Wt 221 lb 3.2 oz (100.3 kg)   SpO2 96%   BMI 32.67 kg/m  , BMI Body mass index is 32.67 kg/m. Last weight:  Wt Readings from Last 3 Encounters:  11/10/22 221 lb 3.2 oz (100.3 kg)  10/24/22 215 lb 9.6 oz (97.8 kg)  10/13/22 210 lb (95.3 kg)     Physical Exam Constitutional:      Appearance: Normal appearance.  Cardiovascular:     Rate and Rhythm: Normal rate and regular rhythm.     Heart sounds: Normal heart sounds.  Pulmonary:     Effort: Pulmonary effort is normal.     Breath sounds: Normal breath sounds.  Musculoskeletal:     Right lower leg: No edema.     Left lower leg: No edema.  Neurological:     Mental Status: She is alert.       EKG:   Recent Labs: 11/22/2021: Magnesium 1.9 01/10/2022: B Natriuretic Peptide 159.3 05/09/2022: ALT 19; Hemoglobin 12.7; Platelets 203 08/26/2022: Potassium 4.1; Sodium 142 10/13/2022: BUN 24; Creatinine, Ser 0.94    Lipid Panel    Component Value Date/Time   CHOL 118 05/09/2022 1513   CHOL 153 03/01/2012 1132   TRIG 140 05/09/2022 1513   TRIG 127 03/01/2012 1132   HDL 40 (L) 05/09/2022 1513   HDL 27 (L) 03/01/2012 1132   CHOLHDL 3.0 05/09/2022 1513   VLDL 19.0 01/11/2021 1001   VLDL 25 03/01/2012 1132   LDLCALC 56 05/09/2022 1513   LDLCALC 101 (H)  03/01/2012 1132   LDLDIRECT 64.0 05/17/2019 1058      Other studies Reviewed: Additional studies/ records that were reviewed today include:  Review of the above records demonstrates:       No data to display            ASSESSMENT AND PLAN:    ICD-10-CM  1. S/P CABG x 2  Z95.1 CARDIAC EVENT MONITOR    losartan (COZAAR) 100 MG tablet    2. Paroxysmal atrial fibrillation (HCC)  I48.0 CARDIAC EVENT MONITOR    losartan (COZAAR) 100 MG tablet    3. Chronic diastolic CHF (congestive heart failure) (HCC)  I50.32 CARDIAC EVENT MONITOR    losartan (COZAAR) 100 MG tablet    4. Hypertension secondary to other renal disorders  I15.1 CARDIAC EVENT MONITOR    losartan (COZAAR) 100 MG tablet   ANGIOGRAM BY DR Wyn Quaker OK WITH STENT OK. BP 160/90, CHANGE LOSARTAN TO 100 DAILY    5. Syncope, unspecified syncope type  R55 CARDIAC EVENT MONITOR    losartan (COZAAR) 100 MG tablet   2 WEEKS AGO FELT LIGHTHEADED, PALPITATION, AND ABOUT TO PASS OUT. PLACE HOLTER    6. Other chest pain  R07.89 CARDIAC EVENT MONITOR    losartan (COZAAR) 100 MG tablet   STRESS TEST WAS OK 6/24       Problem List Items Addressed This Visit       Cardiovascular and Mediastinum   Hypertension (Chronic)   Relevant Medications   losartan (COZAAR) 100 MG tablet   Other Relevant Orders   CARDIAC EVENT MONITOR   Paroxysmal atrial fibrillation (HCC) (Chronic)   Relevant Medications   losartan (COZAAR) 100 MG tablet   Other Relevant Orders   CARDIAC EVENT MONITOR   Chronic diastolic CHF (congestive heart failure) (HCC)   Relevant Medications   losartan (COZAAR) 100 MG tablet   Other Relevant Orders   CARDIAC EVENT MONITOR     Other   S/P CABG x 2 - Primary   Relevant Medications   losartan (COZAAR) 100 MG tablet   Other Relevant Orders   CARDIAC EVENT MONITOR   Chest pain   Relevant Medications   losartan (COZAAR) 100 MG tablet   Other Relevant Orders   CARDIAC EVENT MONITOR   Other Visit Diagnoses      Syncope, unspecified syncope type       2 WEEKS AGO FELT LIGHTHEADED, PALPITATION, AND ABOUT TO PASS OUT. PLACE HOLTER   Relevant Medications   losartan (COZAAR) 100 MG tablet   Other Relevant Orders   CARDIAC EVENT MONITOR          Disposition:   Return in about 6 weeks (around 12/22/2022) for HOLTER AND F/U.    Total time spent: 30 minutes  Signed,  Audrey Blackwater, MD  11/10/2022 10:06 AM    [facility]

## 2022-11-11 NOTE — Telephone Encounter (Signed)
It appears this was just filled by Dr Park Breed at a higher dose. I will decline this prescription given this recent change.

## 2022-11-14 ENCOUNTER — Ambulatory Visit: Payer: Medicare Other | Admitting: Family Medicine

## 2022-11-17 DIAGNOSIS — I701 Atherosclerosis of renal artery: Secondary | ICD-10-CM | POA: Diagnosis not present

## 2022-11-17 DIAGNOSIS — N1831 Chronic kidney disease, stage 3a: Secondary | ICD-10-CM | POA: Diagnosis not present

## 2022-11-17 DIAGNOSIS — I10 Essential (primary) hypertension: Secondary | ICD-10-CM | POA: Diagnosis not present

## 2022-11-21 ENCOUNTER — Ambulatory Visit: Payer: BC Managed Care – PPO

## 2022-11-21 DIAGNOSIS — I4891 Unspecified atrial fibrillation: Secondary | ICD-10-CM | POA: Diagnosis not present

## 2022-11-21 DIAGNOSIS — I5032 Chronic diastolic (congestive) heart failure: Secondary | ICD-10-CM

## 2022-11-21 DIAGNOSIS — I151 Hypertension secondary to other renal disorders: Secondary | ICD-10-CM

## 2022-11-21 DIAGNOSIS — R0789 Other chest pain: Secondary | ICD-10-CM

## 2022-11-21 DIAGNOSIS — Z951 Presence of aortocoronary bypass graft: Secondary | ICD-10-CM

## 2022-11-21 DIAGNOSIS — R55 Syncope and collapse: Secondary | ICD-10-CM

## 2022-11-21 DIAGNOSIS — I480 Paroxysmal atrial fibrillation: Secondary | ICD-10-CM

## 2022-11-25 ENCOUNTER — Ambulatory Visit: Payer: BC Managed Care – PPO | Attending: Student in an Organized Health Care Education/Training Program

## 2022-11-25 DIAGNOSIS — R0602 Shortness of breath: Secondary | ICD-10-CM | POA: Insufficient documentation

## 2022-11-25 LAB — PULMONARY FUNCTION TEST ARMC ONLY
DL/VA % pred: 81 %
DL/VA: 3.24 ml/min/mmHg/L
DLCO unc % pred: 61 %
DLCO unc: 14.08 ml/min/mmHg
FEF 25-75 Post: 2.03 L/s
FEF 25-75 Pre: 1.93 L/s
FEF2575-%Change-Post: 5 %
FEF2575-%Pred-Post: 96 %
FEF2575-%Pred-Pre: 91 %
FEV1-%Change-Post: 2 %
FEV1-%Pred-Post: 75 %
FEV1-%Pred-Pre: 73 %
FEV1-Post: 2.03 L
FEV1-Pre: 1.99 L
FEV1FVC-%Change-Post: 6 %
FEV1FVC-%Pred-Pre: 103 %
FEV6-%Change-Post: -2 %
FEV6-%Pred-Post: 71 %
FEV6-%Pred-Pre: 73 %
FEV6-Post: 2.45 L
FEV6-Pre: 2.51 L
FEV6FVC-%Change-Post: 1 %
FEV6FVC-%Pred-Post: 104 %
FEV6FVC-%Pred-Pre: 103 %
FVC-%Change-Post: -3 %
FVC-%Pred-Post: 68 %
FVC-%Pred-Pre: 71 %
FVC-Post: 2.45 L
FVC-Pre: 2.55 L
Post FEV1/FVC ratio: 83 %
Post FEV6/FVC ratio: 100 %
Pre FEV1/FVC ratio: 78 %
Pre FEV6/FVC Ratio: 99 %
RV % pred: 124 %
RV: 3.09 L
TLC % pred: 97 %
TLC: 5.65 L

## 2022-11-25 MED ORDER — ALBUTEROL SULFATE (2.5 MG/3ML) 0.083% IN NEBU
2.5000 mg | INHALATION_SOLUTION | Freq: Once | RESPIRATORY_TRACT | Status: AC
  Administered 2022-11-25: 2.5 mg via RESPIRATORY_TRACT
  Filled 2022-11-25: qty 3

## 2022-11-26 ENCOUNTER — Other Ambulatory Visit: Payer: Self-pay | Admitting: Cardiovascular Disease

## 2022-11-28 ENCOUNTER — Ambulatory Visit (INDEPENDENT_AMBULATORY_CARE_PROVIDER_SITE_OTHER): Payer: BC Managed Care – PPO | Admitting: Student in an Organized Health Care Education/Training Program

## 2022-11-28 ENCOUNTER — Encounter: Payer: Self-pay | Admitting: Student in an Organized Health Care Education/Training Program

## 2022-11-28 VITALS — BP 128/60 | HR 73 | Temp 97.8°F | Ht 69.0 in | Wt 221.4 lb

## 2022-11-28 DIAGNOSIS — I5032 Chronic diastolic (congestive) heart failure: Secondary | ICD-10-CM | POA: Diagnosis not present

## 2022-11-28 DIAGNOSIS — R0602 Shortness of breath: Secondary | ICD-10-CM | POA: Diagnosis not present

## 2022-11-28 NOTE — Progress Notes (Unsigned)
Synopsis: Referred in for shortness of breath by Glori Luis, MD  Assessment & Plan:   1. Shortness of breath 2. Chronic diastolic CHF (congestive heart failure) (HCC)  Audrey Wolf is presenting today for the evaluation of shortness of breath that has been chronic for over a year now. She has a history of HFpEF and imaging in 2019 showing interlobular septal thickening, ground glass opacities, and bilateral pleural effusions consistent with decompensated heart failure. On exam today, she has bilateral lower limb edema. TTE read with pulmonary hypertension, though no values given to TAPSE, TRV, or estimation of PA pressures. PFT's performed prior to today's visit show normal spirometry, normal lung volumes, and a decrease in DLCO, consistent with a pulmonary vascular process.  Given clear lung exam, pulmonary hypertension on echo, pitting edema on exam, history of HFpEF with pulmonary edema, and history of CKD, it is my impression that Audrey Wolf shortness of breath is driven by heart failure rather than by a pulmonary pathology. I have discussed this with   Given that the patient tells me she has a solitary kidney and she is concerned about her kidney function, I have recommended that she reach out to her nephrologist and cardiologist so that she could be started on a low-dose diuretic. I will place a referral to our heart failure clinic for further evaluation. I would recommend consideration for a right heart cath should there remain a diagnostic uncertainty. Patient was again counseled on the importance of salt avoidance.  - AMB referral to CHF clinic  Return in about 1 year (around 11/28/2023), or if symptoms worsen or fail to improve.  I spent 35 minutes caring for this patient today, including preparing to see the patient, obtaining a medical history , reviewing a separately obtained history, performing a medically appropriate examination and/or evaluation, counseling and educating  the patient/family/caregiver, ordering medications, tests, or procedures, documenting clinical information in the electronic health record, and independently interpreting results (not separately reported/billed) and communicating results to the patient/family/caregiver  Raechel Chute, MD [facility] 11/28/2022 11:33 AM    End of visit medications:  No orders of the defined types were placed in this encounter.    Current Outpatient Medications:    acetaminophen (TYLENOL) 500 MG tablet, Take 1 tablet (500 mg total) by mouth daily as needed for mild pain or headache., Disp: 30 tablet, Rfl: 0   albuterol (VENTOLIN HFA) 108 (90 Base) MCG/ACT inhaler, SMARTSIG:2 Puff(s) By Mouth Every 4-6 Hours PRN, Disp: , Rfl:    carvedilol (COREG) 25 MG tablet, TAKE 1 TABLET BY MOUTH TWICE DAILY WITH MEALS, Disp: 180 tablet, Rfl: 3   clopidogrel (PLAVIX) 75 MG tablet, Take 1 tablet (75 mg total) by mouth daily., Disp: 30 tablet, Rfl: 11   diltiazem (CARDIZEM CD) 180 MG 24 hr capsule, Take 1 capsule (180 mg total) by mouth daily., Disp: 30 capsule, Rfl: 11   ELIQUIS 5 MG TABS tablet, Take 1 tablet by mouth twice daily, Disp: 60 tablet, Rfl: 0   famotidine (PEPCID) 20 MG tablet, Take 20 mg by mouth 2 (two) times daily., Disp: , Rfl:    hydrALAZINE (APRESOLINE) 25 MG tablet, Take 1 tablet (25 mg total) by mouth 4 (four) times daily., Disp: 120 tablet, Rfl: 11   loratadine (CLARITIN) 10 MG tablet, Take 10 mg by mouth daily. , Disp: , Rfl:    losartan (COZAAR) 100 MG tablet, Take 1 tablet (100 mg total) by mouth daily., Disp: 30 tablet, Rfl:  11   Multiple Vitamins-Minerals (PRESERVISION AREDS 2) CAPS, Take 2 capsules by mouth daily., Disp: , Rfl:    rosuvastatin (CRESTOR) 20 MG tablet, Take 1 tablet by mouth once daily, Disp: 90 tablet, Rfl: 3   SYMBICORT 80-4.5 MCG/ACT inhaler, Inhale 2 puffs by mouth twice daily, Disp: 11 g, Rfl: 0   vitamin C (ASCORBIC ACID) 250 MG tablet, Take 250 mg by  mouth daily., Disp: , Rfl:    Vitamin D, Ergocalciferol, (DRISDOL) 1.25 MG (50000 UNIT) CAPS capsule, Take 50,000 Units by mouth every 7 (seven) days., Disp: , Rfl:    Subjective:   PATIENT ID: Audrey Wolf GENDER: female DOB: 1950/08/06, MRN: [id number]  Chief Complaint  Patient presents with   Follow-up    SOB with exertion, non prod cough and wheezing.     HPI  Audrey Wolf is a 72 year old female with a past medical history of CAD (s/p CABG), HFpEF, Afib, and report of asthma presenting to clinic for follow up.  Symptoms remain unchanged. She continues to have exertional dyspnea, and is unable to be active without shortness of breath. She has an occasional cough, but no wheeze, chest pain, or chest tightness. She has a solitary kidney and is followed by nephrology and cardiology - she is not on any diuretics. She is unable to walk 10 feet without feeling short of breath. She has orthopnea and has to sleep either sitting in her recliner or with multiple pillows secondary to the shortness of breath. She reports being diagnosed with asthma over 30 years ago; trials of Symbicort and albuterol failed to resolve her symptoms. She is presenting to follow up today following PFT's.  Review of the medical record reveals multiple ambulatory visit with nephrology, cardiology, and vascular surgery. 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 Repeat angiography 10/2022 showed the stent to be patent. TTE ordered by her outpatient cardiologist read as having pulmonary hypertension (PA pressures, regurgitant tricuspid velocity, and TAPSE not reported).   She is a lifelong non-smoker and currently works at [employer].  She is inquiring about the possibility of extending her short-term disability.  Ancillary information including prior medications, full  medical/surgical/family/social histories, and PFTs (when available) are listed below and have been reviewed.   Review of Systems  Constitutional:  Negative for chills, fever and weight loss.  Respiratory:  Positive for shortness of breath. Negative for sputum production and wheezing.   Cardiovascular:  Negative for chest pain.     Objective:   Vitals:   11/28/22 1112  BP: 128/60  Pulse: 73  Temp: 97.8 F (36.6 C)  TempSrc: Temporal  SpO2: 99%  Weight: 221 lb 6.4 oz (100.4 kg)  Height: 5\' 9"  (1.753 m)   99% on RA  BMI Readings from Last 3 Encounters:  11/28/22 32.70 kg/m  11/10/22 32.67 kg/m  10/24/22 31.84 kg/m   Wt Readings from Last 3 Encounters:  11/28/22 221 lb 6.4 oz (100.4 kg)  11/10/22 221 lb 3.2 oz (100.3 kg)  10/24/22 215 lb 9.6 oz (97.8 kg)    Physical Exam Constitutional:      General: She is not in acute distress.    Appearance: She is obese. She is not ill-appearing.  HENT:     Mouth/Throat:     Mouth: Mucous membranes are moist.  Eyes:     Extraocular Movements: Extraocular movements intact.  Cardiovascular:  Rate and Rhythm: Normal rate and regular rhythm.  Pulmonary:     Effort: Pulmonary effort is normal.     Breath sounds: No rales.  Abdominal:     Palpations: Abdomen is soft.  Musculoskeletal:     Cervical back: Neck supple.     Right lower leg: Edema present.     Left lower leg: Edema present.  Neurological:     General: No focal deficit present.     Mental Status: She is alert and oriented to person, place, and time.       Ancillary Information    Past Medical History:  Diagnosis Date   Arthritis    Asthma    CHF (congestive heart failure) (HCC)    Chronic kidney disease    Coronary artery disease    GERD (gastroesophageal reflux disease)    Headache    Hypertension    Nephrolithiasis    NSTEMI (non-ST elevated myocardial infarction) (HCC)    Postprandial RUQ pain 05/25/2019     Family History  Problem  Relation Age of Onset   Arthritis Mother    Arthritis Father    Heart disease Father    Stroke Father    Sudden Cardiac Death Father    Stroke Son    Breast cancer Maternal Aunt      Past Surgical History:  Procedure Laterality Date   ABDOMINAL HYSTERECTOMY     APPENDECTOMY     BREAST BIOPSY Left 12/31/2010   neg   CORONARY ARTERY BYPASS GRAFT N/A 05/22/2017   Procedure: CORONARY ARTERY BYPASS GRAFTING (CABG) x 2 WITH ENDOSCOPIC HARVESTING OF RIGHT SAPHENOUS VEIN;  Surgeon: Delight Ovens, MD;  Location: [facility] OR;  Service: Open Heart Surgery;  Laterality: N/A;   LEFT HEART CATH AND CORONARY ANGIOGRAPHY N/A 05/18/2017   Procedure: LEFT HEART CATH AND CORONARY ANGIOGRAPHY;  Surgeon: Laurier Nancy, MD;  Location: [facility];  Service: Cardiovascular;  Laterality: N/A;   LITHOTRIPSY     RENAL ANGIOGRAPHY N/A 07/31/2022   Procedure: RENAL ANGIOGRAPHY;  Surgeon: Annice Needy, MD;  Location: [facility];  Service: Cardiovascular;  Laterality: N/A;   RENAL ANGIOGRAPHY N/A 10/13/2022   Procedure: RENAL ANGIOGRAPHY;  Surgeon: Annice Needy, MD;  Location: [facility];  Service: Cardiovascular;  Laterality: N/A;   TEE WITHOUT CARDIOVERSION N/A 05/22/2017   Procedure: TRANSESOPHAGEAL ECHOCARDIOGRAM (TEE);  Surgeon: Delight Ovens, MD;  Location: [facility] OR;  Service: Open Heart Surgery;  Laterality: N/A;   TONSILLECTOMY      Social History   Socioeconomic History   Marital status: Single    Spouse name: Not on file   Number of children: Not on file   Years of education: Not on file   Highest education level: 12th grade  Occupational History    Employer: [employer]  Tobacco Use   Smoking status: Never   Smokeless tobacco: Never  Vaping Use   Vaping status: Never Used  Substance and Sexual Activity   Alcohol use: No   Drug use: No   Sexual activity: Not Currently    Partners: Male    Birth control/protection: Post-menopausal  Other Topics Concern   Not on  file  Social History Narrative   Divorced    Social Determinants of Health   Financial Resource Strain: Low Risk  (10/06/2022)   Overall Financial Resource Strain (CARDIA)    Difficulty of Paying Living Expenses: Not very hard  Food Insecurity: No Food Insecurity (10/20/2022)  Hunger Vital Sign    Worried About Running Out of Food in the Last Year: Never true    Ran Out of Food in the Last Year: Never true  Transportation Needs: No Transportation Needs (10/20/2022)   PRAPARE - Administrator, Civil Service (Medical): No    Lack of Transportation (Non-Medical): No  Physical Activity: Insufficiently Active (10/20/2022)   Exercise Vital Sign    Days of Exercise per Week: 2 days    Minutes of Exercise per Session: 30 min  Stress: No Stress Concern Present (10/20/2022)   [name]    Feeling of Stress : Only a little  Recent Concern: Stress - Stress Concern Present (10/06/2022)   [name]    Feeling of Stress : To some extent  Social Connections: Unknown (10/20/2022)   Social Connection and Isolation Panel [NHANES]    Frequency of Communication with Friends and Family: Twice a week    Frequency of Social Gatherings with Friends and Family: Patient declined    Attends Religious Services: Never    Database administrator or Organizations: No    Attends Engineer, structural: Never    Marital Status: Divorced  Catering manager Violence: Not At Risk (10/07/2022)   Humiliation, Afraid, Rape, and Kick questionnaire    Fear of Current or Ex-Partner: No    Emotionally Abused: No    Physically Abused: No    Sexually Abused: No     Allergies  Allergen Reactions   Lisinopril Cough   Ace Inhibitors Other (See Comments)    Has been told to avoid these and any diuretics because "left kidney has shrunk up and doesn't work"   Baclofen Other (See  Comments)    Urinary frequency      CBC    Component Value Date/Time   WBC 5.8 05/09/2022 1513   RBC 3.99 05/09/2022 1513   HGB 12.7 05/09/2022 1513   HGB 14.5 03/01/2012 1132   HCT 36.6 05/09/2022 1513   HCT 41.5 03/01/2012 1132   PLT 203 05/09/2022 1513   PLT 244 03/01/2012 1132   MCV 91.7 05/09/2022 1513   MCV 90 03/01/2012 1132   MCH 31.8 05/09/2022 1513   MCHC 34.7 05/09/2022 1513   RDW 13.8 05/09/2022 1513   RDW 13.5 03/01/2012 1132   LYMPHSABS 1.3 01/10/2022 1040   LYMPHSABS 1.8 03/01/2012 1132   MONOABS 0.5 01/10/2022 1040   MONOABS 0.5 03/01/2012 1132   EOSABS 0.1 01/10/2022 1040   EOSABS 0.1 03/01/2012 1132   BASOSABS 0.0 01/10/2022 1040   BASOSABS 0.0 03/01/2012 1132    Pulmonary Functions Testing Results:    Latest Ref Rng & Units 05/20/2017    9:39 AM  PFT Results  FVC-Pre L 1.93   FVC-Predicted Pre % 52   Pre FEV1/FVC % % 73   FEV1-Pre L 1.42   FEV1-Predicted Pre % 49     Outpatient Medications Prior to Visit  Medication Sig Dispense Refill   acetaminophen (TYLENOL) 500 MG tablet Take 1 tablet (500 mg total) by mouth daily as needed for mild pain or headache. 30 tablet 0   albuterol (VENTOLIN HFA) 108 (90 Base) MCG/ACT inhaler SMARTSIG:2 Puff(s) By Mouth Every 4-6 Hours PRN     carvedilol (COREG) 25 MG tablet TAKE 1 TABLET BY MOUTH TWICE DAILY WITH MEALS 180 tablet 3   clopidogrel (PLAVIX) 75 MG tablet Take 1 tablet (75  mg total) by mouth daily. 30 tablet 11   diltiazem (CARDIZEM CD) 180 MG 24 hr capsule Take 1 capsule (180 mg total) by mouth daily. 30 capsule 11   ELIQUIS 5 MG TABS tablet Take 1 tablet by mouth twice daily 60 tablet 0   famotidine (PEPCID) 20 MG tablet Take 20 mg by mouth 2 (two) times daily.     hydrALAZINE (APRESOLINE) 25 MG tablet Take 1 tablet (25 mg total) by mouth 4 (four) times daily. 120 tablet 11   loratadine (CLARITIN) 10 MG tablet Take 10 mg by mouth daily.      losartan (COZAAR) 100 MG tablet Take 1 tablet (100 mg total)  by mouth daily. 30 tablet 11   Multiple Vitamins-Minerals (PRESERVISION AREDS 2) CAPS Take 2 capsules by mouth daily.     rosuvastatin (CRESTOR) 20 MG tablet Take 1 tablet by mouth once daily 90 tablet 3   SYMBICORT 80-4.5 MCG/ACT inhaler Inhale 2 puffs by mouth twice daily 11 g 0   vitamin C (ASCORBIC ACID) 250 MG tablet Take 250 mg by mouth daily.     Vitamin D, Ergocalciferol, (DRISDOL) 1.25 MG (50000 UNIT) CAPS capsule Take 50,000 Units by mouth every 7 (seven) days.     No facility-administered medications prior to visit.

## 2022-12-01 ENCOUNTER — Other Ambulatory Visit
Admission: RE | Admit: 2022-12-01 | Discharge: 2022-12-01 | Disposition: A | Discharge status: Home / Self Care | Payer: BC Managed Care – PPO | Source: Ambulatory Visit | Attending: Cardiology | Admitting: Cardiology

## 2022-12-01 ENCOUNTER — Ambulatory Visit (HOSPITAL_BASED_OUTPATIENT_CLINIC_OR_DEPARTMENT_OTHER): Payer: BC Managed Care – PPO | Admitting: Cardiology

## 2022-12-01 ENCOUNTER — Encounter: Payer: Self-pay | Admitting: Cardiology

## 2022-12-01 ENCOUNTER — Other Ambulatory Visit: Payer: Self-pay | Admitting: *Deleted

## 2022-12-01 VITALS — BP 157/63 | HR 66 | Wt 219.1 lb

## 2022-12-01 DIAGNOSIS — I5032 Chronic diastolic (congestive) heart failure: Secondary | ICD-10-CM | POA: Insufficient documentation

## 2022-12-01 LAB — CBC
HCT: 29.4 % — ABNORMAL LOW (ref 36.0–46.0)
Hemoglobin: 9.7 g/dL — ABNORMAL LOW (ref 12.0–15.0)
MCH: 28.8 pg (ref 26.0–34.0)
MCHC: 33 g/dL (ref 30.0–36.0)
MCV: 87.2 fL (ref 80.0–100.0)
Platelets: 254 10*3/uL (ref 150–400)
RBC: 3.37 MIL/uL — ABNORMAL LOW (ref 3.87–5.11)
RDW: 12.4 % (ref 11.5–15.5)
WBC: 6 10*3/uL (ref 4.0–10.5)
nRBC: 0 % (ref 0.0–0.2)

## 2022-12-01 LAB — BRAIN NATRIURETIC PEPTIDE: B Natriuretic Peptide: 153.5 pg/mL — ABNORMAL HIGH (ref 0.0–100.0)

## 2022-12-01 LAB — BASIC METABOLIC PANEL
Anion gap: 7 (ref 5–15)
BUN: 24 mg/dL — ABNORMAL HIGH (ref 8–23)
CO2: 23 mmol/L (ref 22–32)
Calcium: 8.7 mg/dL — ABNORMAL LOW (ref 8.9–10.3)
Chloride: 107 mmol/L (ref 98–111)
Creatinine, Ser: 0.98 mg/dL (ref 0.44–1.00)
GFR, Estimated: >60 mL/min (ref >60)
Glucose, Bld: 94 mg/dL (ref 70–99)
Potassium: 3.8 mmol/L (ref 3.5–5.1)
Sodium: 137 mmol/L (ref 135–145)

## 2022-12-01 MED ORDER — HYDRALAZINE HCL 50 MG PO TABS: 50.0000 mg | ORAL_TABLET | Freq: Three times a day (TID) | ORAL | 3 refills | Status: DC

## 2022-12-01 NOTE — Progress Notes (Signed)
PCP: Glori Luis, MD HF Cardiology: Dr. Shirlee Latch  72 y.o. with history of CAD s/p CAVG, CKD stage 3a, paroxysmal atrial fibrillation and right renal artery stenosis was referred by Dr. Aundria Rud for evaluation of CHF and pulmonary hypertension. Patient has a long history of poorly controlled HTN.  She has an atrophic left kidney and has right renal artery stenosis now s/p stenting in 6/24 by Dr. Wyn Quaker.  She had LHC in 4/19 showing 70% ostial left main stenosis and subsequently had CABG x 2 with LIMA-LAD and SVG-OM by Dr. Tyrone Sage at [facility]. Most recent echo in 6/24 at outside facility was read as EF 55-60%, mild LVH, grade 2 diastolic dysfunction, "normal RV", mild MR, mild AI, "mild pulmonary hypertension."  She had a Cardiolite in 5/24 that showed no ischemia or infarction.   Patient recently saw Dr. Aundria Rud for evaluation of her chronic dyspnea.  She has never smoked and does not have known lung disease. PFTs in 10/24 showed normal spirometry but decreased DLCO, possibly consistent with pulmonary vascular disease. He referred her to this clinic due to concern for CHF and pulmonary hypertension.   BP remains elevated today, but since her right renal artery stenting, patient reports that BP sometimes drops low (?SBP < 100).  However, it seems like BP is still generally elevated. No chest pain.  She gets short of breath after walking about 50 yards.  This seems to wax and wane, currently doing better.  She is short of breath with stairs.  She works at [employer], gets dyspnea at times [employer].  She can dress, shower, and do housework without problems.  She has occasional episodes of lightheadedness, no syncope/presyncope.  She is wearing a heart monitor currently.    Labs (4/24): LDL 56 Labs (10/24): K 4.4, creatinine 1.26  PMH: 1. CKD stage 3 2. Left hydronephrosis with atrophic left kidney 3. Atrial fibrillation: Paroxysmal 4. CAD: LHC in 4/19 with 70% ostial LM stenosis.  Patient had  CABG x 2 in 4/19 by Dr. Tyrone Sage with LIMA-LAD and SVG-OM.  - Cardiolite (5/24): EF 62%, normal study.  5. Chronic diastolic CHF: Echo (6/24) with EF 55-60%, mild LVH, grade 2 diastolic dysfunction, "normal RV", mild MR, mild AI, "mild pulmonary hypertension."    6. HTN: Difficult to control.  - ACEI cough 7. Right renal artery stenosis: Stent to right renal artery in 6/24 (Dr. Wyn Quaker).  8. Hyperlipidemia 9. PFTs (10/24): Normal spirometry but decreased DLCO.   FH: Father with MI, CVA.  Mother with MI.   Social History   Socioeconomic History   Marital status: Single    Spouse name: Not on file   Number of children: Not on file   Years of education: Not on file   Highest education level: 12th grade  Occupational History    Employer: [employer]  Tobacco Use   Smoking status: Never   Smokeless tobacco: Never  Vaping Use   Vaping status: Never Used  Substance and Sexual Activity   Alcohol use: No   Drug use: No   Sexual activity: Not Currently    Partners: Male    Birth control/protection: Post-menopausal  Other Topics Concern   Not on file  Social History Narrative   Divorced    Social Determinants of Health   Financial Resource Strain: Low Risk  (10/06/2022)   Overall Financial Resource Strain (CARDIA)    Difficulty of Paying Living Expenses: Not very hard  Food Insecurity: No Food Insecurity (10/20/2022)  Hunger Vital Sign    Worried About Running Out of Food in the Last Year: Never true    Ran Out of Food in the Last Year: Never true  Transportation Needs: No Transportation Needs (10/20/2022)   PRAPARE - Administrator, Civil Service (Medical): No    Lack of Transportation (Non-Medical): No  Physical Activity: Insufficiently Active (10/20/2022)   Exercise Vital Sign    Days of Exercise per Week: 2 days    Minutes of Exercise per Session: 30 min  Stress: No Stress Concern Present (10/20/2022)   [employer] of Occupational Health - Occupational Stress  Questionnaire    Feeling of Stress : Only a little  Recent Concern: Stress - Stress Concern Present (10/06/2022)   [name]    Feeling of Stress : To some extent  Social Connections: Unknown (10/20/2022)   Social Connection and Isolation Panel [NHANES]    Frequency of Communication with Friends and Family: Twice a week    Frequency of Social Gatherings with Friends and Family: Patient declined    Attends Religious Services: Never    Database administrator or Organizations: No    Attends Banker Meetings: Never    Marital Status: Divorced  Catering manager Violence: Not At Risk (10/07/2022)   Humiliation, Afraid, Rape, and Kick questionnaire    Fear of Current or Ex-Partner: No    Emotionally Abused: No    Physically Abused: No    Sexually Abused: No   ROS: All systems reviewed and negative except as per HPI.   Current Outpatient Medications  Medication Sig Dispense Refill   acetaminophen (TYLENOL) 500 MG tablet Take 1 tablet (500 mg total) by mouth daily as needed for mild pain or headache. 30 tablet 0   albuterol (VENTOLIN HFA) 108 (90 Base) MCG/ACT inhaler SMARTSIG:2 Puff(s) By Mouth Every 4-6 Hours PRN     carvedilol (COREG) 25 MG tablet TAKE 1 TABLET BY MOUTH TWICE DAILY WITH MEALS 180 tablet 3   clopidogrel (PLAVIX) 75 MG tablet Take 1 tablet (75 mg total) by mouth daily. 30 tablet 11   diltiazem (CARDIZEM CD) 180 MG 24 hr capsule Take 1 capsule (180 mg total) by mouth daily. 30 capsule 11   ELIQUIS 5 MG TABS tablet Take 1 tablet by mouth twice daily 60 tablet 0   famotidine (PEPCID) 20 MG tablet Take 20 mg by mouth 2 (two) times daily.     hydrALAZINE (APRESOLINE) 50 MG tablet Take 1 tablet (50 mg total) by mouth 3 (three) times daily. 270 tablet 3   loratadine (CLARITIN) 10 MG tablet Take 10 mg by mouth daily.      losartan (COZAAR) 100 MG tablet Take 1 tablet (100 mg total) by mouth daily. 30 tablet  11   Multiple Vitamins-Minerals (PRESERVISION AREDS 2) CAPS Take 2 capsules by mouth daily.     rosuvastatin (CRESTOR) 20 MG tablet Take 1 tablet by mouth once daily 90 tablet 3   SYMBICORT 80-4.5 MCG/ACT inhaler Inhale 2 puffs by mouth twice daily 11 g 0   vitamin C (ASCORBIC ACID) 250 MG tablet Take 250 mg by mouth daily.     Vitamin D, Ergocalciferol, (DRISDOL) 1.25 MG (50000 UNIT) CAPS capsule Take 50,000 Units by mouth every 7 (seven) days.     No current facility-administered medications for this visit.   BP (!) 157/63   Pulse 66   Wt 219 lb 2 oz (99.4 kg)  SpO2 96%   BMI 32.36 kg/m  General: NAD Neck: No JVD, no thyromegaly or thyroid nodule.  Lungs: Clear to auscultation bilaterally with normal respiratory effort. CV: Nondisplaced PMI.  Heart regular S1/S2, no S3/S4, no murmur.  No peripheral edema.  No carotid bruit.  Normal pedal pulses.  Abdomen: Soft, nontender, no hepatosplenomegaly, no distention.  Skin: Intact without lesions or rashes.  Neurologic: Alert and oriented x 3.  Psych: Normal affect. Extremities: No clubbing or cyanosis.  HEENT: Normal.   Assessment/Plan: 1. Chronic diastolic CHF/pulmonary hypertension: Echo in 6/24 showed EF 55-60%, mild LVH, grade 2 diastolic dysfunction, "normal RV", mild MR, mild AI, "mild pulmonary hypertension."  PFTs in 10/24 showed normal spirometry but there was decreased DLCO concerning for a pulmonary vascular process.  She never smoked. NYHA class II-III symptoms, not clearly volume overloaded on exam.  - I will arrange for RHC to assess filling pressures and PA pressure.  We discussed risks/benefits and she agrees to procedure.  She can hold Eliquis the day before and the day of procedure.  - I will check BNP today.  - I think she would benefit from SGLT2 inhibitor, would like to get RHC first though.  2. CAD: S/p CABG in 4/19 with LIMA-LAD and SVG-OM.  She had a negative Cardiolite in 5/24.  No chest pain.  - Continue statin,  good lipids in 4/24.  - She is on Plavix post-right renal artery stent placement.  3. CKD stage 3a: Atrophic left kidney, s/p right renal artery stent placement.  Recent BMET stable.  4. Renal artery stenosis: On right, s/p stent placement.  - Continue Plavix for now, will eventually stop given Eliquis use.  5. Atrial fibrillation: She is in NSR today and has only rare palpitations.   - Continue Eliquis.  6. HTN: Historically poor control. Renal artery stenosis s/p PCI right renal artery.  BP high today but she says that sometimes it is low.  She keeps a BP log but did not bring it today.  - She forgets hydralazine frequently, consolidate dosing to 50 mg tid rather than 25 mg qid.  - Continue losartan 100 mg daily - Continue Coreg 25 mg bid.  - She is on diltiazem CD.  Will have her bring BP log to next appointment and if BP runs high, will change diltiazem CD to amlodipine.   Followup with me in 3-4 weeks.   Marca Ancona 12/01/2022

## 2022-12-01 NOTE — Patient Instructions (Signed)
Medication Changes:  Take Hydralazine to 50 mg (1 tablet) 3 times a day.   Lab Work:  Labs done today, your results will be available in MyChart, we will contact you for abnormal readings.   Testing/Procedures:  Your physician has requested that you have a cardiac catheterization. Cardiac catheterization is used to diagnose and/or treat various heart conditions. Doctors may recommend this procedure for a number of different reasons. The most common reason is to evaluate chest pain. Chest pain can be a symptom of coronary artery disease (CAD), and cardiac catheterization can show whether plaque is narrowing or blocking your heart's arteries. This procedure is also used to evaluate the valves, as well as measure the blood flow and oxygen levels in different parts of your heart. For further information please visit [email or web address] Please follow instruction sheet, as given.   We will call you to schedule your heart cath with date and time.     Special Instructions // Education:  Do the following things EVERYDAY: Weigh yourself in the morning before breakfast. Write it down and keep it in a log. Take your medicines as prescribed Eat low salt foods--Limit salt (sodium) to 2000 mg per day.  Stay as active as you can everyday Limit all fluids for the day to less than 2 liters   Please bring your blood pressure log to your next appointment.   Follow-Up in: follow up in 3 weeks after your cath with Dr. Shirlee Latch.     If you have any questions or concerns before your next appointment please send [place] a message through [place] or call our office at [phone] Monday-Friday 8 am-5 pm.   If you have an urgent need after hours on the weekend please call your Primary Cardiologist or the [facility] in [place] at [phone].   At the [facility], you and your health needs are our priority. We have a designated team specialized in the treatment of Heart  Failure. This Care Team includes your primary Heart Failure Specialized Cardiologist (physician), Advanced Practice Providers (APPs- Physician Assistants and Nurse Practitioners), and Pharmacist who all work together to provide you with the care you need, when you need it.   You may see any of the following providers on your designated Care Team at your next follow up:  Dr. Arvilla Meres Dr. Marca Ancona Dr. Dorthula Nettles Dr. Theresia Bough Tonye Becket, NP Robbie Lis, [place] [address] [place], [place] Brynda Peon, NP [place] Lee, NP Clarisa Kindred, NP Enos Fling, PharmD

## 2022-12-02 ENCOUNTER — Telehealth: Payer: Self-pay

## 2022-12-02 DIAGNOSIS — D509 Iron deficiency anemia, unspecified: Secondary | ICD-10-CM

## 2022-12-02 DIAGNOSIS — I5032 Chronic diastolic (congestive) heart failure: Secondary | ICD-10-CM

## 2022-12-02 NOTE — Telephone Encounter (Addendum)
Pt aware, agreeable, and verbalized understanding Still waiting on RHC precert Lab orders placed.  ----- Message from Marca Ancona sent at 12/01/2022  5:05 PM EDT ----- BNP elevated, plan RHC.  Mild anemia. Will need to check Fe studies.

## 2022-12-05 DIAGNOSIS — I4891 Unspecified atrial fibrillation: Secondary | ICD-10-CM | POA: Diagnosis not present

## 2022-12-08 ENCOUNTER — Other Ambulatory Visit
Admission: RE | Admit: 2022-12-08 | Discharge: 2022-12-08 | Disposition: A | Discharge status: Home / Self Care | Payer: BC Managed Care – PPO | Attending: Cardiology | Admitting: Cardiology

## 2022-12-08 DIAGNOSIS — D509 Iron deficiency anemia, unspecified: Secondary | ICD-10-CM | POA: Insufficient documentation

## 2022-12-08 DIAGNOSIS — I5032 Chronic diastolic (congestive) heart failure: Secondary | ICD-10-CM | POA: Diagnosis not present

## 2022-12-08 LAB — IRON: Iron: 63 ug/dL (ref 28–170)

## 2022-12-15 ENCOUNTER — Other Ambulatory Visit: Payer: Self-pay | Admitting: Family Medicine

## 2022-12-15 DIAGNOSIS — I151 Hypertension secondary to other renal disorders: Secondary | ICD-10-CM

## 2022-12-22 ENCOUNTER — Encounter: Payer: Self-pay | Admitting: Cardiovascular Disease

## 2022-12-22 ENCOUNTER — Telehealth: Payer: Self-pay | Admitting: *Deleted

## 2022-12-22 ENCOUNTER — Ambulatory Visit (INDEPENDENT_AMBULATORY_CARE_PROVIDER_SITE_OTHER): Payer: BC Managed Care – PPO | Admitting: Cardiovascular Disease

## 2022-12-22 VITALS — BP 148/72 | HR 70 | Ht 69.0 in | Wt 221.0 lb

## 2022-12-22 DIAGNOSIS — Z951 Presence of aortocoronary bypass graft: Secondary | ICD-10-CM

## 2022-12-22 DIAGNOSIS — I5032 Chronic diastolic (congestive) heart failure: Secondary | ICD-10-CM

## 2022-12-22 DIAGNOSIS — I480 Paroxysmal atrial fibrillation: Secondary | ICD-10-CM | POA: Diagnosis not present

## 2022-12-22 DIAGNOSIS — R0602 Shortness of breath: Secondary | ICD-10-CM

## 2022-12-22 DIAGNOSIS — N183 Chronic kidney disease, stage 3 unspecified: Secondary | ICD-10-CM | POA: Diagnosis not present

## 2022-12-22 MED ORDER — DAPAGLIFLOZIN PROPANEDIOL 10 MG PO TABS: 10.0000 mg | ORAL_TABLET | Freq: Every day | ORAL | 3 refills | Status: AC

## 2022-12-22 NOTE — Progress Notes (Signed)
Cardiology Office Note   Date:  12/22/2022   ID:  MEKAILA Wolf, DOB [birth year], MRN [id number]  PCP:  Glori Luis, MD  Cardiologist:  Adrian Blackwater, MD      History of Present Illness: Audrey Wolf is a 72 y.o. female who presents for No chief complaint on file.   Patient works at [employer] and when she exerts she gets short of breath.  Dyspnea on exertion but not at rest.  She was seen by pulmonary and PFTs were unremarkable but because of mild pulmonary hypertension on echocardiogram patient was reduced for 2 [facility] for further evaluation for some reason.  They want to do right heart catheterization but nobody has contacted her yet.  At this time she is not very keen on having right heart catheterization as she denies any chest pain or shortness of breath at rest.  She had a negative stress test in the past.      Past Medical History:  Diagnosis Date   Arthritis    Asthma    CHF (congestive heart failure) (HCC)    Chronic kidney disease    Coronary artery disease    GERD (gastroesophageal reflux disease)    Headache    Hypertension    Nephrolithiasis    NSTEMI (non-ST elevated myocardial infarction) (HCC)    Postprandial RUQ pain 05/25/2019     Past Surgical History:  Procedure Laterality Date   ABDOMINAL HYSTERECTOMY     APPENDECTOMY     BREAST BIOPSY Left 12/31/2010   neg   CORONARY ARTERY BYPASS GRAFT N/A 05/22/2017   Procedure: CORONARY ARTERY BYPASS GRAFTING (CABG) x 2 WITH ENDOSCOPIC HARVESTING OF RIGHT SAPHENOUS VEIN;  Surgeon: Delight Ovens, MD;  Location: [facility] OR;  Service: Open Heart Surgery;  Laterality: N/A;   LEFT HEART CATH AND CORONARY ANGIOGRAPHY N/A 05/18/2017   Procedure: LEFT HEART CATH AND CORONARY ANGIOGRAPHY;  Surgeon: Laurier Nancy, MD;  Location: [facility] INVASIVE CV LAB;  Service: Cardiovascular;  Laterality: N/A;   LITHOTRIPSY     RENAL ANGIOGRAPHY N/A 07/31/2022   Procedure: RENAL ANGIOGRAPHY;  Surgeon:  Annice Needy, MD;  Location: [facility] INVASIVE CV LAB;  Service: Cardiovascular;  Laterality: N/A;   RENAL ANGIOGRAPHY N/A 10/13/2022   Procedure: RENAL ANGIOGRAPHY;  Surgeon: Annice Needy, MD;  Location: [facility] INVASIVE CV LAB;  Service: Cardiovascular;  Laterality: N/A;   TEE WITHOUT CARDIOVERSION N/A 05/22/2017   Procedure: TRANSESOPHAGEAL ECHOCARDIOGRAM (TEE);  Surgeon: Delight Ovens, MD;  Location: [facility] OR;  Service: Open Heart Surgery;  Laterality: N/A;   TONSILLECTOMY       Current Outpatient Medications  Medication Sig Dispense Refill   dapagliflozin propanediol (FARXIGA) 10 MG TABS tablet Take 1 tablet (10 mg total) by mouth daily before breakfast. 30 tablet 3   acetaminophen (TYLENOL) 500 MG tablet Take 1 tablet (500 mg total) by mouth daily as needed for mild pain or headache. 30 tablet 0   albuterol (VENTOLIN HFA) 108 (90 Base) MCG/ACT inhaler SMARTSIG:2 Puff(s) By Mouth Every 4-6 Hours PRN     carvedilol (COREG) 25 MG tablet TAKE 1 TABLET BY MOUTH TWICE DAILY WITH MEALS 180 tablet 0   clopidogrel (PLAVIX) 75 MG tablet Take 1 tablet (75 mg total) by mouth daily. 30 tablet 11   diltiazem (CARDIZEM CD) 180 MG 24 hr capsule Take 1 capsule (180 mg total) by mouth daily. 30 capsule 11   ELIQUIS 5 MG TABS tablet Take 1 tablet  by mouth twice daily 60 tablet 0   famotidine (PEPCID) 20 MG tablet Take 20 mg by mouth 2 (two) times daily.     hydrALAZINE (APRESOLINE) 50 MG tablet Take 1 tablet (50 mg total) by mouth 3 (three) times daily. 270 tablet 3   loratadine (CLARITIN) 10 MG tablet Take 10 mg by mouth daily.      losartan (COZAAR) 100 MG tablet Take 1 tablet (100 mg total) by mouth daily. 30 tablet 11   Multiple Vitamins-Minerals (PRESERVISION AREDS 2) CAPS Take 2 capsules by mouth daily.     rosuvastatin (CRESTOR) 20 MG tablet Take 1 tablet by mouth once daily 90 tablet 3   SYMBICORT 80-4.5 MCG/ACT inhaler Inhale 2 puffs by mouth twice daily 11 g 0   vitamin C (ASCORBIC ACID) 250 MG tablet  Take 250 mg by mouth daily.     Vitamin D, Ergocalciferol, (DRISDOL) 1.25 MG (50000 UNIT) CAPS capsule Take 50,000 Units by mouth every 7 (seven) days.     No current facility-administered medications for this visit.    Allergies:   Lisinopril, Ace inhibitors, and Baclofen    Social History:   reports that she has never smoked. She has never used smokeless tobacco. She reports that she does not drink alcohol and does not use drugs.   Family History:  family history includes Arthritis in her father and mother; Breast cancer in her maternal aunt; Heart disease in her father; Stroke in her father and son; Sudden Cardiac Death in her father.    ROS:     Review of Systems  Constitutional: Negative.   HENT: Negative.    Eyes: Negative.   Respiratory: Negative.    Gastrointestinal: Negative.   Genitourinary: Negative.   Musculoskeletal: Negative.   Skin: Negative.   Neurological: Negative.   Endo/Heme/Allergies: Negative.   Psychiatric/Behavioral: Negative.    All other systems reviewed and are negative.     All other systems are reviewed and negative.    PHYSICAL EXAM: VS:  BP (!) 148/72   Pulse 70   Ht 5\' 9"  (1.753 m)   Wt 221 lb (100.2 kg)   SpO2 98%   BMI 32.64 kg/m  , BMI Body mass index is 32.64 kg/m. Last weight:  Wt Readings from Last 3 Encounters:  12/22/22 221 lb (100.2 kg)  12/01/22 219 lb 2 oz (99.4 kg)  11/28/22 221 lb 6.4 oz (100.4 kg)     Physical Exam Constitutional:      Appearance: Normal appearance.  Cardiovascular:     Rate and Rhythm: Normal rate and regular rhythm.     Heart sounds: Normal heart sounds.  Pulmonary:     Effort: Pulmonary effort is normal.     Breath sounds: Normal breath sounds.  Musculoskeletal:     Right lower leg: No edema.     Left lower leg: No edema.  Neurological:     Mental Status: She is alert.       EKG:   Recent Labs: 05/09/2022: ALT 19 12/01/2022: B Natriuretic Peptide 153.5; BUN 24; Creatinine, Ser  0.98; Hemoglobin 9.7; Platelets 254; Potassium 3.8; Sodium 137    Lipid Panel    Component Value Date/Time   CHOL 118 05/09/2022 1513   CHOL 153 03/01/2012 1132   TRIG 140 05/09/2022 1513   TRIG 127 03/01/2012 1132   HDL 40 (L) 05/09/2022 1513   HDL 27 (L) 03/01/2012 1132   CHOLHDL 3.0 05/09/2022 1513   VLDL 19.0 01/11/2021 1001  VLDL 25 03/01/2012 1132   LDLCALC 56 05/09/2022 1513   LDLCALC 101 (H) 03/01/2012 1132   LDLDIRECT 64.0 05/17/2019 1058      Other studies Reviewed: Additional studies/ records that were reviewed today include:  Review of the above records demonstrates:       No data to display            ASSESSMENT AND PLAN:    ICD-10-CM   1. Paroxysmal atrial fibrillation (HCC)  I48.0 dapagliflozin propanediol (FARXIGA) 10 MG TABS tablet    2. S/P CABG x 2  Z95.1 dapagliflozin propanediol (FARXIGA) 10 MG TABS tablet    3. Chronic diastolic CHF (congestive heart failure) (HCC)  I50.32 dapagliflozin propanediol (FARXIGA) 10 MG TABS tablet   Shortness of breath on exertion is most likely due to HFpEF with grade 2 diastolic dysfunction on echocardiogram.  Will add [place].    4. Stage 3 chronic kidney disease, unspecified whether stage 3a or 3b CKD (HCC)  N18.30 dapagliflozin propanediol (FARXIGA) 10 MG TABS tablet    5. SOB (shortness of breath)  R06.02 dapagliflozin propanediol (FARXIGA) 10 MG TABS tablet   Patient has dyspnea on exertion with mild pulmonary hypertension on echocardiogram.  She also has grade 2 diastolic dysfunction.  Will try Farxiga       Problem List Items Addressed This Visit       Cardiovascular and Mediastinum   Paroxysmal atrial fibrillation (HCC) - Primary (Chronic)   Relevant Medications   dapagliflozin propanediol (FARXIGA) 10 MG TABS tablet   Chronic diastolic CHF (congestive heart failure) (HCC)   Relevant Medications   dapagliflozin propanediol (FARXIGA) 10 MG TABS tablet     Genitourinary   CKD (chronic kidney  disease) stage 3, GFR 30-59 ml/min (HCC)   Relevant Medications   dapagliflozin propanediol (FARXIGA) 10 MG TABS tablet     Other   S/P CABG x 2   Relevant Medications   dapagliflozin propanediol (FARXIGA) 10 MG TABS tablet   Other Visit Diagnoses     SOB (shortness of breath)       Patient has dyspnea on exertion with mild pulmonary hypertension on echocardiogram.  She also has grade 2 diastolic dysfunction.  Will try Farxiga   Relevant Medications   dapagliflozin propanediol (FARXIGA) 10 MG TABS tablet          Disposition:   Return in about 2 months (around 02/21/2023).    Total time spent: 30 minutes  Signed,  Adrian Blackwater, MD  12/22/2022 11:53 AM    [facility]

## 2022-12-25 ENCOUNTER — Other Ambulatory Visit: Payer: Self-pay | Admitting: Cardiovascular Disease

## 2022-12-30 ENCOUNTER — Encounter: Payer: Self-pay | Admitting: Emergency Medicine

## 2022-12-30 ENCOUNTER — Emergency Department
Admission: EM | Admit: 2022-12-30 | Discharge: 2022-12-30 | Disposition: A | Discharge status: Home / Self Care | Payer: BC Managed Care – PPO | Attending: Emergency Medicine | Admitting: Emergency Medicine

## 2022-12-30 ENCOUNTER — Other Ambulatory Visit: Payer: Self-pay

## 2022-12-30 DIAGNOSIS — K068 Other specified disorders of gingiva and edentulous alveolar ridge: Secondary | ICD-10-CM | POA: Insufficient documentation

## 2022-12-30 DIAGNOSIS — Z7901 Long term (current) use of anticoagulants: Secondary | ICD-10-CM | POA: Diagnosis not present

## 2022-12-30 DIAGNOSIS — K1379 Other lesions of oral mucosa: Secondary | ICD-10-CM | POA: Diagnosis present

## 2022-12-30 LAB — CBC WITH DIFFERENTIAL/PLATELET
Abs Immature Granulocytes: 0.02 10*3/uL (ref 0.00–0.07)
Basophils Absolute: 0 10*3/uL (ref 0.0–0.1)
Basophils Relative: 1 %
Eosinophils Absolute: 0.1 10*3/uL (ref 0.0–0.5)
Eosinophils Relative: 2 %
HCT: 30.7 % — ABNORMAL LOW (ref 36.0–46.0)
Hemoglobin: 10 g/dL — ABNORMAL LOW (ref 12.0–15.0)
Immature Granulocytes: 0 %
Lymphocytes Relative: 37 %
Lymphs Abs: 2.1 10*3/uL (ref 0.7–4.0)
MCH: 29.2 pg (ref 26.0–34.0)
MCHC: 32.6 g/dL (ref 30.0–36.0)
MCV: 89.5 fL (ref 80.0–100.0)
Monocytes Absolute: 0.6 10*3/uL (ref 0.1–1.0)
Monocytes Relative: 10 %
Neutro Abs: 2.8 10*3/uL (ref 1.7–7.7)
Neutrophils Relative %: 50 %
Platelets: 215 10*3/uL (ref 150–400)
RBC: 3.43 MIL/uL — ABNORMAL LOW (ref 3.87–5.11)
RDW: 15.2 % (ref 11.5–15.5)
WBC: 5.5 10*3/uL (ref 4.0–10.5)
nRBC: 0 % (ref 0.0–0.2)

## 2022-12-30 LAB — COMPREHENSIVE METABOLIC PANEL
ALT: 15 U/L (ref 0–44)
AST: 16 U/L (ref 15–41)
Albumin: 3.8 g/dL (ref 3.5–5.0)
Alkaline Phosphatase: 63 U/L (ref 38–126)
Anion gap: 5 (ref 5–15)
BUN: 34 mg/dL — ABNORMAL HIGH (ref 8–23)
CO2: 23 mmol/L (ref 22–32)
Calcium: 8.5 mg/dL — ABNORMAL LOW (ref 8.9–10.3)
Chloride: 112 mmol/L — ABNORMAL HIGH (ref 98–111)
Creatinine, Ser: 1.01 mg/dL — ABNORMAL HIGH (ref 0.44–1.00)
GFR, Estimated: 59 mL/min — ABNORMAL LOW (ref >60)
Glucose, Bld: 107 mg/dL — ABNORMAL HIGH (ref 70–99)
Potassium: 3.9 mmol/L (ref 3.5–5.1)
Sodium: 140 mmol/L (ref 135–145)
Total Bilirubin: 0.5 mg/dL (ref <1.2)
Total Protein: 6.3 g/dL — ABNORMAL LOW (ref 6.5–8.1)

## 2022-12-30 LAB — PROTIME-INR
INR: 1 (ref 0.8–1.2)
Prothrombin Time: 13.5 s (ref 11.4–15.2)

## 2022-12-30 MED ORDER — LIDOCAINE VISCOUS HCL 2 % MT SOLN
15.0000 mL | Freq: Once | OROMUCOSAL | Status: AC
  Administered 2022-12-30: 15 mL via OROMUCOSAL
  Filled 2022-12-30: qty 15

## 2022-12-30 MED ORDER — SILVER NITRATE-POT NITRATE 75-25 % EX MISC
1.0000 | Freq: Once | CUTANEOUS | Status: AC
  Administered 2022-12-30: 1 via TOPICAL
  Filled 2022-12-30: qty 10

## 2022-12-30 NOTE — ED Triage Notes (Signed)
Patient ambulatory to triage with steady gait, without difficulty or distress noted; pt reports bleeding to gums x 3 days; currently taking eliquist, plavix and ASA; seen at [facility] yesterday and rx antibiotics for dental infection

## 2022-12-30 NOTE — ED Provider Notes (Signed)
[facility] Provider Note    Event Date/Time   First MD Initiated Contact with Patient 12/30/22 [phone]     (approximate)   History   Mouth Lesions   HPI  Audrey Wolf is a 72 y.o. female  who presents to the emergency department today because of concern for bleeding from her gums. She states this started a few days ago.  She did see her doctor started on antibiotics.  However the bleeding has continued.  Patient is on blood thinners.  Notes that she has poor dentition.      Physical Exam   Triage Vital Signs: ED Triage Vitals  Encounter Vitals Group     BP 12/30/22 0150 (!) 178/74     Systolic BP Percentile --      Diastolic BP Percentile --      Pulse Rate 12/30/22 0150 77     Resp 12/30/22 0150 18     Temp 12/30/22 0150 98.2 F (36.8 C)     Temp src --      SpO2 12/30/22 0150 97 %     Weight 12/30/22 0139 217 lb (98.4 kg)     Height 12/30/22 0139 5\' 9"  (1.753 m)     Head Circumference --      Peak Flow --      Pain Score 12/30/22 0139 0     Pain Loc --      Pain Education --      Exclude from Growth Chart --     Most recent vital signs: Vitals:   12/30/22 0150  BP: (!) 178/74  Pulse: 77  Resp: 18  Temp: 98.2 F (36.8 C)  SpO2: 97%   General: Awake, alert, oriented. CV:  Good peripheral perfusion.  Resp:  Normal effort.  Abd:  No distention.  Other:  Poor dentition, some bleeding noted around lower incisors.    ED Results / Procedures / Treatments   Labs (all labs ordered are listed, but only abnormal results are displayed) Labs Reviewed  CBC WITH DIFFERENTIAL/PLATELET - Abnormal; Notable for the following components:      Result Value   RBC 3.43 (*)    Hemoglobin 10.0 (*)    HCT 30.7 (*)    All other components within normal limits  COMPREHENSIVE METABOLIC PANEL - Abnormal; Notable for the following components:   Chloride 112 (*)    Glucose, Bld 107 (*)    BUN 34 (*)    Creatinine, Ser 1.01 (*)    Calcium 8.5  (*)    Total Protein 6.3 (*)    GFR, Estimated 59 (*)    All other components within normal limits  PROTIME-INR     EKG  None   RADIOLOGY None  PROCEDURES:  Critical Care performed: No   MEDICATIONS ORDERED IN ED: Medications - No data to display   IMPRESSION / MDM / ASSESSMENT AND PLAN / ED COURSE  I reviewed the triage vital signs and the nursing notes.                              Differential diagnosis includes, but is not limited to, infection, carries  Patient's presentation is most consistent with acute presentation with potential threat to life or bodily function.  Patient presented to the emergency department today because of concerns for bleeding from her gums.  Patient does have blood condition.  Did try some silver nitrate to  see if that would help slow down the bleeding.  At this time there is reasonable for patient be discharged.  Will have patient follow-up with dentistry.  Encourage patient to continue antibiotics.   FINAL CLINICAL IMPRESSION(S) / ED DIAGNOSES   Final diagnoses:  Bleeding gums      Note:  This document was prepared using Dragon voice recognition software and may include unintentional dictation errors.    Phineas Semen, MD 12/30/22 [phone]

## 2023-01-11 ENCOUNTER — Emergency Department: Payer: BC Managed Care – PPO

## 2023-01-11 ENCOUNTER — Emergency Department
Admission: EM | Admit: 2023-01-11 | Discharge: 2023-01-11 | Disposition: A | Discharge status: Home / Self Care | Payer: BC Managed Care – PPO | Attending: Emergency Medicine | Admitting: Emergency Medicine

## 2023-01-11 ENCOUNTER — Other Ambulatory Visit: Payer: Self-pay

## 2023-01-11 DIAGNOSIS — R079 Chest pain, unspecified: Secondary | ICD-10-CM

## 2023-01-11 DIAGNOSIS — R0789 Other chest pain: Secondary | ICD-10-CM | POA: Diagnosis not present

## 2023-01-11 LAB — BASIC METABOLIC PANEL
Anion gap: 9 (ref 5–15)
BUN: 28 mg/dL — ABNORMAL HIGH (ref 8–23)
CO2: 20 mmol/L — ABNORMAL LOW (ref 22–32)
Calcium: 8.6 mg/dL — ABNORMAL LOW (ref 8.9–10.3)
Chloride: 111 mmol/L (ref 98–111)
Creatinine, Ser: 1.11 mg/dL — ABNORMAL HIGH (ref 0.44–1.00)
GFR, Estimated: 52 mL/min — ABNORMAL LOW (ref >60)
Glucose, Bld: 153 mg/dL — ABNORMAL HIGH (ref 70–99)
Potassium: 3.7 mmol/L (ref 3.5–5.1)
Sodium: 140 mmol/L (ref 135–145)

## 2023-01-11 LAB — CBC
HCT: 29.9 % — ABNORMAL LOW (ref 36.0–46.0)
Hemoglobin: 9.8 g/dL — ABNORMAL LOW (ref 12.0–15.0)
MCH: 30.1 pg (ref 26.0–34.0)
MCHC: 32.8 g/dL (ref 30.0–36.0)
MCV: 91.7 fL (ref 80.0–100.0)
Platelets: 216 10*3/uL (ref 150–400)
RBC: 3.26 MIL/uL — ABNORMAL LOW (ref 3.87–5.11)
RDW: 16.1 % — ABNORMAL HIGH (ref 11.5–15.5)
WBC: 6.5 10*3/uL (ref 4.0–10.5)
nRBC: 0 % (ref 0.0–0.2)

## 2023-01-11 LAB — TROPONIN I (HIGH SENSITIVITY)
Troponin I (High Sensitivity): 4 ng/L (ref <18)
Troponin I (High Sensitivity): 4 ng/L (ref <18)

## 2023-01-11 NOTE — ED Provider Notes (Signed)
[facility] Provider Note    Event Date/Time   First MD Initiated Contact with Patient 01/11/23 1521     (approximate)   History   Chief Complaint: Chest Pain   HPI  Audrey Wolf is a 72 y.o. female who comes to the ED due to chest tightness that she noticed when waking up this morning.  She has had recurrent chest tightness like this in the past.  She has had extensive workup by her cardiologist Dr. Lennette Bihari without any specific findings.  Chest tightness has been constant all day, reports that it is worse by "working at [employer]."  No specific aggravating or alleviating factors, not exertional, not pleuritic.  No shortness of breath diaphoresis vomiting or radiation.  No dizziness or syncope.  No recent illness, no cough or fever.    07/04/22 nuclear stress  = normal 07/08/22 TTE = normal, EF 55%      Physical Exam   Triage Vital Signs: ED Triage Vitals  Encounter Vitals Group     BP 01/11/23 1117 (!) 142/51     Systolic BP Percentile --      Diastolic BP Percentile --      Pulse Rate 01/11/23 1117 72     Resp 01/11/23 1117 17     Temp 01/11/23 1117 98 F (36.7 C)     Temp Source 01/11/23 1117 Oral     SpO2 01/11/23 1117 98 %     Weight 01/11/23 1116 200 lb (90.7 kg)     Height 01/11/23 1116 5\' 9"  (1.753 m)     Head Circumference --      Peak Flow --      Pain Score 01/11/23 1115 2     Pain Loc --      Pain Education --      Exclude from Growth Chart --     Most recent vital signs: Vitals:   01/11/23 1117  BP: (!) 142/51  Pulse: 72  Resp: 17  Temp: 98 F (36.7 C)  SpO2: 98%    General: Awake, no distress.  CV:  Good peripheral perfusion.  Regular rate rhythm Resp:  Normal effort.  Clear to auscultation bilaterally Abd:  No distention.  Soft nontender Other:  Moist oral mucosa   ED Results / Procedures / Treatments   Labs (all labs ordered are listed, but only abnormal results are displayed) Labs Reviewed  BASIC  METABOLIC PANEL - Abnormal; Notable for the following components:      Result Value   CO2 20 (*)    Glucose, Bld 153 (*)    BUN 28 (*)    Creatinine, Ser 1.11 (*)    Calcium 8.6 (*)    GFR, Estimated 52 (*)    All other components within normal limits  CBC - Abnormal; Notable for the following components:   RBC 3.26 (*)    Hemoglobin 9.8 (*)    HCT 29.9 (*)    RDW 16.1 (*)    All other components within normal limits  TROPONIN I (HIGH SENSITIVITY)  TROPONIN I (HIGH SENSITIVITY)     EKG Interpreted by me Sinus rhythm rate of 70.  Normal axis, normal intervals.  Poor R wave progression.  Normal ST segments and T waves   RADIOLOGY Chest x-ray interpreted by me, appears normal.  Radiology report reviewed   PROCEDURES:  Procedures   MEDICATIONS ORDERED IN ED: Medications - No data to display   IMPRESSION / MDM / ASSESSMENT  AND PLAN / ED COURSE  I reviewed the triage vital signs and the nursing notes.  DDx: GERD, non-STEMI, functional pain, electrolyte abnormality, pneumothorax, pneumonia, pleural effusion  Patient's presentation is most consistent with acute presentation with potential threat to life or bodily function.  Patient presents with atypical chest discomfort/tightness.  Overall reassuring history and physical exam.  Will obtain labs, chest x-ray.   Clinical Course as of 01/11/23 Rana Snare Jan 11, 2023  1705 Sx resolved. Serial trop normal. Overall reassuring evaluation in ED. Had TTE + nuc. Stress 6 months ago - normal. Low susp of ACS, TAD, PE [PS]    Clinical Course User Index [PS] Sharman Cheek, MD     FINAL CLINICAL IMPRESSION(S) / ED DIAGNOSES   Final diagnoses:  Nonspecific chest pain     Rx / DC Orders   ED Discharge Orders     None        Note:  This document was prepared using Dragon voice recognition software and may include unintentional dictation errors.   Sharman Cheek, MD 01/11/23 [name]

## 2023-01-11 NOTE — ED Triage Notes (Signed)
Pt sts that she woke up this morning with chest tightness. Pt sts that it is not pain however it is tightness.

## 2023-01-11 NOTE — Discharge Instructions (Signed)
Your tests today were all reassuring. Please continue to follow up with your doctor regarding these symptoms.

## 2023-01-13 ENCOUNTER — Telehealth: Payer: Self-pay

## 2023-01-13 NOTE — Transitions of Care (Post Inpatient/ED Visit) (Signed)
I spoke with pt; pt was seen [facility] ED 01/11/23 with discomfort feeling in chest;; no CP and no more SOB than usual. Pt said she is not having any discomfort or pain in chest today., pt already has 3 mth FU appt with Dr Birdie Sons with [facility] ED precautions and pt voiced understanding. Sending note to Dr Birdie Sons.      01/13/2023  Name: KADEEJA RISTINE MRN: [id number] DOB: April 03, 1950  Today's TOC FU Call Status: Today's TOC FU Call Status:: Successful TOC FU Call Completed Unsuccessful Call (1st Attempt) Date: 01/13/23 [facility] FU Call Complete Date: 01/13/23 Patient's Name and Date of Birth confirmed.  Transition Care Management Follow-up Telephone Call Date of Discharge: 01/11/23 Discharge Facility: [facility]) Type of Discharge: Emergency Department Reason for ED Visit: Other: (I spoke with pt; pt was seen [facility] ED 01/11/23 with discomfort feeling in chest;; no CP and no more SOB than usual.) How have you been since you were released from the hospital?: Better Any questions or concerns?: No  Items Reviewed: Did you receive and understand the discharge instructions provided?: Yes Medications obtained,verified, and reconciled?: Partial Review Completed (pt stopped me 1/2 way thru med list and pt said I did not need to go thru the list because nothing has changed.) Any new allergies since your discharge?: No Dietary orders reviewed?: NA Do you have support at home?: Yes People in Home: child(ren), adult Name of Support/Comfort Primary Source: Verdon Cummins  Medications Reviewed Today: Medications Reviewed Today   Medications were not reviewed in this encounter     Home Care and Equipment/Supplies: Were Home Health Services Ordered?: NA Any new equipment or medical supplies ordered?: NA  Functional Questionnaire: Do you need assistance with bathing/showering or dressing?: No Do you need assistance with meal preparation?: No Do you need assistance with  eating?: No Do you have difficulty maintaining continence: No Do you need assistance with getting out of bed/getting out of a chair/moving?: No Do you have difficulty managing or taking your medications?: No  Follow up appointments reviewed: PCP Follow-up appointment confirmed?: Yes Date of PCP follow-up appointment?: 01/23/23 Follow-up Provider: [facility] Follow-up appointment confirmed?: NA Do you need transportation to your follow-up appointment?: No Do you understand care options if your condition(s) worsen?: Yes-patient verbalized understanding    SIGNATURE Lewanda Rife, LPN

## 2023-01-13 NOTE — Transitions of Care (Post Inpatient/ED Visit) (Signed)
Unable to reach patient by phone and left v/m requesting call back at [phone].       01/13/2023  Name: Audrey Wolf MRN: [id number] DOB: [birth year]  Today's TOC FU Call Status: Today's TOC FU Call Status:: Unsuccessful Call (1st Attempt) Unsuccessful Call (1st Attempt) Date: 01/13/23  Attempted to reach the patient regarding the most recent Inpatient/ED visit.  Follow Up Plan: Additional outreach attempts will be made to reach the patient to complete the Transitions of Care (Post Inpatient/ED visit) call.   Signature Lewanda Rife, LPN

## 2023-01-14 ENCOUNTER — Other Ambulatory Visit (INDEPENDENT_AMBULATORY_CARE_PROVIDER_SITE_OTHER): Payer: Self-pay | Admitting: Vascular Surgery

## 2023-01-14 DIAGNOSIS — I150 Renovascular hypertension: Secondary | ICD-10-CM

## 2023-01-14 NOTE — Telephone Encounter (Signed)
Reviewed

## 2023-01-16 ENCOUNTER — Ambulatory Visit (INDEPENDENT_AMBULATORY_CARE_PROVIDER_SITE_OTHER): Payer: BC Managed Care – PPO

## 2023-01-16 ENCOUNTER — Ambulatory Visit (INDEPENDENT_AMBULATORY_CARE_PROVIDER_SITE_OTHER): Payer: BC Managed Care – PPO | Admitting: Vascular Surgery

## 2023-01-16 DIAGNOSIS — I150 Renovascular hypertension: Secondary | ICD-10-CM

## 2023-01-23 ENCOUNTER — Other Ambulatory Visit: Payer: Self-pay | Admitting: Cardiovascular Disease

## 2023-01-23 ENCOUNTER — Encounter: Payer: Self-pay | Admitting: Family Medicine

## 2023-01-23 ENCOUNTER — Ambulatory Visit (INDEPENDENT_AMBULATORY_CARE_PROVIDER_SITE_OTHER): Payer: BC Managed Care – PPO | Admitting: Family Medicine

## 2023-01-23 VITALS — BP 126/82 | HR 66 | Temp 98.0°F | Ht 69.0 in | Wt 220.0 lb

## 2023-01-23 DIAGNOSIS — Z23 Encounter for immunization: Secondary | ICD-10-CM

## 2023-01-23 DIAGNOSIS — I10 Essential (primary) hypertension: Secondary | ICD-10-CM | POA: Diagnosis not present

## 2023-01-23 DIAGNOSIS — R0609 Other forms of dyspnea: Secondary | ICD-10-CM

## 2023-01-23 DIAGNOSIS — R079 Chest pain, unspecified: Secondary | ICD-10-CM

## 2023-01-23 NOTE — Assessment & Plan Note (Signed)
Undetermined cause.  Discussed this could be cardiac in nature or could be related to musculoskeletal cause.  Could be reflux related though that would seem less likely.  Could be related to anxiety though she reports no anxiety.  She will continue to follow with cardiology for this.

## 2023-01-23 NOTE — Progress Notes (Signed)
[name], MD Phone: [phone]  Audrey Wolf is a 72 y.o. female who presents today for f/u.  Hypertension: Blood pressures typically similar to today.  She is on carvedilol, diltiazem, hydralazine, and losartan.  Chest pain/shortness of breath: Patient reports an episode of chest discomfort that lasted for several hours.  This occurred several weeks ago.  She had some shortness of breath with this.  Notes she did not feel right when she woke up and felt a squeezing sensation in her chest.  It eased off a little bit initially then got worse.  She ended up getting evaluated in the emergency department.  Workup was negative for a cause.  It recurred for about 15 minutes this past Saturday and has not recurred since then.  She notes no reflux or anxiety issues.  She notes no abnormal lifting.  She reports a stress test with cardiology over the summer.  Over the last several months she saw pulmonology for her shortness of breath and notes they advised her there were no pulmonary causes for her shortness of breath.  They felt as though could be related to her heart and she followed up with heart failure specialist.  They advised doing a right heart cath though the patient never got this set up as she never heard from them on this.  She subsequently followed up with her general cardiologist and she reports that her general cardiologist felt as though she did not need a catheterization and added Farxiga which the patient has not started.  She notes the general cardiologist did not tell her he was sending [place] in.  Social History   Tobacco Use  Smoking Status Never  Smokeless Tobacco Never    Current Outpatient Medications on File Prior to Visit  Medication Sig Dispense Refill   acetaminophen (TYLENOL) 500 MG tablet Take 1 tablet (500 mg total) by mouth daily as needed for mild pain or headache. 30 tablet 0   albuterol (VENTOLIN HFA) 108 (90 Base) MCG/ACT inhaler SMARTSIG:2 Puff(s) By  Mouth Every 4-6 Hours PRN     carvedilol (COREG) 25 MG tablet TAKE 1 TABLET BY MOUTH TWICE DAILY WITH MEALS 180 tablet 0   clopidogrel (PLAVIX) 75 MG tablet Take 1 tablet (75 mg total) by mouth daily. 30 tablet 11   dapagliflozin propanediol (FARXIGA) 10 MG TABS tablet Take 1 tablet (10 mg total) by mouth daily before breakfast. 30 tablet 3   diltiazem (CARDIZEM CD) 180 MG 24 hr capsule Take 1 capsule (180 mg total) by mouth daily. 30 capsule 11   famotidine (PEPCID) 20 MG tablet Take 20 mg by mouth 2 (two) times daily.     hydrALAZINE (APRESOLINE) 50 MG tablet Take 1 tablet (50 mg total) by mouth 3 (three) times daily. 270 tablet 3   loratadine (CLARITIN) 10 MG tablet Take 10 mg by mouth daily.      losartan (COZAAR) 100 MG tablet Take 1 tablet (100 mg total) by mouth daily. 30 tablet 11   Multiple Vitamins-Minerals (PRESERVISION AREDS 2) CAPS Take 2 capsules by mouth daily.     rosuvastatin (CRESTOR) 20 MG tablet Take 1 tablet by mouth once daily 90 tablet 3   SYMBICORT 80-4.5 MCG/ACT inhaler Inhale 2 puffs by mouth twice daily 11 g 0   vitamin C (ASCORBIC ACID) 250 MG tablet Take 250 mg by mouth daily.     Vitamin D, Ergocalciferol, (DRISDOL) 1.25 MG (50000 UNIT) CAPS capsule Take 50,000 Units by mouth every 7 (seven) days.  No current facility-administered medications on file prior to visit.     ROS see history of present illness  Objective  Physical Exam Vitals:   01/23/23 0808  BP: 126/82  Pulse: 66  Temp: 98 F (36.7 C)  SpO2: 99%    BP Readings from Last 3 Encounters:  01/23/23 126/82  01/11/23 (!) 142/51  12/30/22 (!) 169/71   Wt Readings from Last 3 Encounters:  01/23/23 220 lb (99.8 kg)  01/11/23 200 lb (90.7 kg)  12/30/22 217 lb (98.4 kg)    Physical Exam Constitutional:      General: She is not in acute distress.    Appearance: She is not diaphoretic.  Cardiovascular:     Rate and Rhythm: Normal rate and regular rhythm.     Heart sounds: Normal heart  sounds.  Pulmonary:     Effort: Pulmonary effort is normal.     Breath sounds: Normal breath sounds.  Skin:    General: Skin is warm and dry.  Neurological:     Mental Status: She is alert.      Assessment/Plan: Please see individual problem list.  Primary hypertension Assessment & Plan: Chronic issue.  Generally well-controlled at this time.  She will continue hydralazine 50 mg 3 times daily, carvedilol 25 mg twice daily, diltiazem 180 mg daily, and losartan 100 mg daily.   Encounter for administration of vaccine -     Flu Vaccine Trivalent High Dose (Fluad)  Chest pain, unspecified type Assessment & Plan: Undetermined cause.  Discussed this could be cardiac in nature or could be related to musculoskeletal cause.  Could be reflux related though that would seem less likely.  Could be related to anxiety though she reports no anxiety.  She will continue to follow with cardiology for this.   Dyspnea on exertion Assessment & Plan: Chronic issue.  Has been evaluated by pulmonology who felt like her symptoms would be more related to heart failure or pulmonary hypertension.  Discussed she would need to make a decision on whether or not she wanted to proceed with a right heart cath through the heart failure clinic or if she wanted to follow the advice of her primary cardiologist.  In the very least I did encourage her to start the [place].  Discussed that this has possible side effect of UTIs/vaginal yeast infection.  If she develops the symptoms she would need to let us know.  Discussed sick day rules with holding this medication if she develops vomiting, diarrhea, or poor oral intake and then restarting when she is not sick anymore.  She will continue to follow with cardiology.      Return in about 3 months (around 04/23/2023) for transfer of care.   [name], MD [facility]

## 2023-01-23 NOTE — Assessment & Plan Note (Signed)
Chronic issue.  Generally well-controlled at this time.  She will continue hydralazine 50 mg 3 times daily, carvedilol 25 mg twice daily, diltiazem 180 mg daily, and losartan 100 mg daily.

## 2023-01-23 NOTE — Assessment & Plan Note (Addendum)
Chronic issue.  Has been evaluated by pulmonology who felt like her symptoms would be more related to heart failure or pulmonary hypertension.  Discussed she would need to make a decision on whether or not she wanted to proceed with a right heart cath through the heart failure clinic or if she wanted to follow the advice of her primary cardiologist.  In the very least I did encourage her to start the [place].  Discussed that this has possible side effect of UTIs/vaginal yeast infection.  If she develops the symptoms she would need to let us know.  Discussed sick day rules with holding this medication if she develops vomiting, diarrhea, or poor oral intake and then restarting when she is not sick anymore.  She will continue to follow with cardiology.

## 2023-01-23 NOTE — Patient Instructions (Addendum)
Nice to see you.  Please start the farxiga the Dr Welton Flakes recommended and see if that helps with your symptoms.

## 2023-02-09 ENCOUNTER — Telehealth: Payer: Self-pay

## 2023-02-09 NOTE — Telephone Encounter (Signed)
 Attempted to reach patient to schedule right heart cath with Dr. Shirlee Latch this week, if patient is available. Unable to reach, left voicemail asking pt to call clinic.

## 2023-02-19 ENCOUNTER — Other Ambulatory Visit: Payer: Self-pay | Admitting: Cardiovascular Disease

## 2023-02-23 ENCOUNTER — Encounter: Payer: Self-pay | Admitting: Cardiovascular Disease

## 2023-02-23 ENCOUNTER — Ambulatory Visit (INDEPENDENT_AMBULATORY_CARE_PROVIDER_SITE_OTHER): Payer: BC Managed Care – PPO | Admitting: Cardiovascular Disease

## 2023-02-23 VITALS — BP 162/87 | HR 72 | Ht 69.0 in | Wt 218.0 lb

## 2023-02-23 DIAGNOSIS — R0789 Other chest pain: Secondary | ICD-10-CM

## 2023-02-23 DIAGNOSIS — I10 Essential (primary) hypertension: Secondary | ICD-10-CM

## 2023-02-23 DIAGNOSIS — E782 Mixed hyperlipidemia: Secondary | ICD-10-CM

## 2023-02-23 DIAGNOSIS — Z951 Presence of aortocoronary bypass graft: Secondary | ICD-10-CM

## 2023-02-23 DIAGNOSIS — I2510 Atherosclerotic heart disease of native coronary artery without angina pectoris: Secondary | ICD-10-CM | POA: Diagnosis not present

## 2023-02-23 DIAGNOSIS — R9431 Abnormal electrocardiogram [ECG] [EKG]: Secondary | ICD-10-CM

## 2023-02-23 DIAGNOSIS — I5032 Chronic diastolic (congestive) heart failure: Secondary | ICD-10-CM

## 2023-02-23 MED ORDER — ISOSORBIDE MONONITRATE ER 30 MG PO TB24: 30.0000 mg | ORAL_TABLET | Freq: Every day | ORAL | 1 refills | Status: AC

## 2023-02-23 NOTE — Progress Notes (Addendum)
Cardiology Office Note   Date:  02/23/2023   ID:  Audrey Wolf, Audrey Wolf 1951-01-07, MRN [id number]  PCP:  Glori Luis, MD  Cardiologist:  Adrian Blackwater, MD      History of Present Illness: Audrey Wolf is a 73 y.o. female who presents for  Chief Complaint  Patient presents with   Follow-up    2 month follow up     Patient came for evaluation because she went to the emergency room with chest pain on December 8 and sat there for 6 hours and was told to go home after EKG and blood work was done.  Patient states she had pressured in her chest associated with shortness of breath and diaphoresis.  She was told to follow-up with me and have testing done in the office as her troponin was negative.  EKG showed some ST depression inferolaterally.  BP high, says took meds 7 AM      Past Medical History:  Diagnosis Date   Arthritis    Asthma    CHF (congestive heart failure) (HCC)    Chronic kidney disease    Coronary artery disease    GERD (gastroesophageal reflux disease)    Headache    Hypertension    Nephrolithiasis    NSTEMI (non-ST elevated myocardial infarction) (HCC)    Postprandial RUQ pain 05/25/2019     Past Surgical History:  Procedure Laterality Date   ABDOMINAL HYSTERECTOMY     APPENDECTOMY     BREAST BIOPSY Left 12/31/2010   neg   CORONARY ARTERY BYPASS GRAFT N/A 05/22/2017   Procedure: CORONARY ARTERY BYPASS GRAFTING (CABG) x 2 WITH ENDOSCOPIC HARVESTING OF RIGHT SAPHENOUS VEIN;  Surgeon: Delight Ovens, MD;  Location: [facility] OR;  Service: Open Heart Surgery;  Laterality: N/A;   LEFT HEART CATH AND CORONARY ANGIOGRAPHY N/A 05/18/2017   Procedure: LEFT HEART CATH AND CORONARY ANGIOGRAPHY;  Surgeon: Laurier Nancy, MD;  Location: [facility];  Service: Cardiovascular;  Laterality: N/A;   LITHOTRIPSY     RENAL ANGIOGRAPHY N/A 07/31/2022   Procedure: RENAL ANGIOGRAPHY;  Surgeon: Annice Needy, MD;  Location: [facility];  Service:  Cardiovascular;  Laterality: N/A;   RENAL ANGIOGRAPHY N/A 10/13/2022   Procedure: RENAL ANGIOGRAPHY;  Surgeon: Annice Needy, MD;  Location: [facility];  Service: Cardiovascular;  Laterality: N/A;   TEE WITHOUT CARDIOVERSION N/A 05/22/2017   Procedure: TRANSESOPHAGEAL ECHOCARDIOGRAM (TEE);  Surgeon: Delight Ovens, MD;  Location: [facility] OR;  Service: Open Heart Surgery;  Laterality: N/A;   TONSILLECTOMY       Current Outpatient Medications  Medication Sig Dispense Refill   albuterol (VENTOLIN HFA) 108 (90 Base) MCG/ACT inhaler SMARTSIG:2 Puff(s) By Mouth Every 4-6 Hours PRN     carvedilol (COREG) 25 MG tablet TAKE 1 TABLET BY MOUTH TWICE DAILY WITH MEALS 180 tablet 0   clopidogrel (PLAVIX) 75 MG tablet Take 1 tablet (75 mg total) by mouth daily. 30 tablet 11   dapagliflozin propanediol (FARXIGA) 10 MG TABS tablet Take 1 tablet (10 mg total) by mouth daily before breakfast. 30 tablet 3   diltiazem (CARDIZEM CD) 180 MG 24 hr capsule Take 1 capsule (180 mg total) by mouth daily. 30 capsule 11   ELIQUIS 5 MG TABS tablet Take 1 tablet by mouth twice daily 60 tablet 0   famotidine (PEPCID) 20 MG tablet Take 20 mg by mouth 2 (two) times daily.     hydrALAZINE (APRESOLINE)  50 MG tablet Take 1 tablet (50 mg total) by mouth 3 (three) times daily. 270 tablet 3   isosorbide mononitrate (IMDUR) 30 MG 24 hr tablet Take 1 tablet (30 mg total) by mouth daily. 30 tablet 1   loratadine (CLARITIN) 10 MG tablet Take 10 mg by mouth daily.      losartan (COZAAR) 100 MG tablet Take 1 tablet (100 mg total) by mouth daily. 30 tablet 11   Multiple Vitamins-Minerals (PRESERVISION AREDS 2) CAPS Take 2 capsules by mouth daily.     rosuvastatin (CRESTOR) 20 MG tablet Take 1 tablet by mouth once daily 90 tablet 3   SYMBICORT 80-4.5 MCG/ACT inhaler Inhale 2 puffs by mouth twice daily 11 g 0   vitamin C (ASCORBIC ACID) 250 MG tablet Take 250 mg by mouth daily.     Vitamin D, Ergocalciferol, (DRISDOL) 1.25 MG (50000  UNIT) CAPS capsule Take 50,000 Units by mouth every 7 (seven) days.     acetaminophen (TYLENOL) 500 MG tablet Take 1 tablet (500 mg total) by mouth daily as needed for mild pain or headache. 30 tablet 0   No current facility-administered medications for this visit.    Allergies:   Lisinopril, Ace inhibitors, and Baclofen    Social History:   reports that she has never smoked. She has never used smokeless tobacco. She reports that she does not drink alcohol and does not use drugs.   Family History:  family history includes Arthritis in her father and mother; Breast cancer in her maternal aunt; Heart disease in her father; Stroke in her father and son; Sudden Cardiac Death in her father.    ROS:     Review of Systems  Constitutional: Negative.   HENT: Negative.    Eyes: Negative.   Respiratory: Negative.    Gastrointestinal: Negative.   Genitourinary: Negative.   Musculoskeletal: Negative.   Skin: Negative.   Neurological: Negative.   Endo/Heme/Allergies: Negative.   Psychiatric/Behavioral: Negative.    All other systems reviewed and are negative.     All other systems are reviewed and negative.    PHYSICAL EXAM: VS:  BP (!) 162/87   Pulse 72   Ht 5\' 9"  (1.753 m)   Wt 218 lb (98.9 kg)   SpO2 98%   BMI 32.19 kg/m  , BMI Body mass index is 32.19 kg/m. Last weight:  Wt Readings from Last 3 Encounters:  02/23/23 218 lb (98.9 kg)  01/23/23 220 lb (99.8 kg)  01/11/23 200 lb (90.7 kg)     Physical Exam Constitutional:      Appearance: Normal appearance.  Cardiovascular:     Rate and Rhythm: Normal rate and regular rhythm.     Heart sounds: Normal heart sounds.  Pulmonary:     Effort: Pulmonary effort is normal.     Breath sounds: Normal breath sounds.  Musculoskeletal:     Right lower leg: No edema.     Left lower leg: No edema.  Neurological:     Mental Status: She is alert.       EKG: Normal sinus rhythm with inferolateral ST depression.  On EKG done in  the hospital on 01/11/2023.  Recent Labs: 12/01/2022: B Natriuretic Peptide 153.5 12/30/2022: ALT 15 01/11/2023: BUN 28; Creatinine, Ser 1.11; Hemoglobin 9.8; Platelets 216; Potassium 3.7; Sodium 140    Lipid Panel    Component Value Date/Time   CHOL 118 05/09/2022 1513   CHOL 153 03/01/2012 1132   TRIG 140 05/09/2022 1513   TRIG  127 03/01/2012 1132   HDL 40 (L) 05/09/2022 1513   HDL 27 (L) 03/01/2012 1132   CHOLHDL 3.0 05/09/2022 1513   VLDL 19.0 01/11/2021 1001   VLDL 25 03/01/2012 1132   LDLCALC 56 05/09/2022 1513   LDLCALC 101 (H) 03/01/2012 1132   LDLDIRECT 64.0 05/17/2019 1058      Other studies Reviewed: Additional studies/ records that were reviewed today include:  Review of the above records demonstrates:       No data to display            ASSESSMENT AND PLAN:    ICD-10-CM   1. Other chest pain  R07.89 PCV ECHOCARDIOGRAM COMPLETE    MYOCARDIAL PERFUSION IMAGING    isosorbide mononitrate (IMDUR) 30 MG 24 hr tablet   Went to ER with chest pain and tropronins were fine but elevated BNP, with EKG having ischaemia, advise echo, stress test.    2. Mixed hyperlipidemia  E78.2 PCV ECHOCARDIOGRAM COMPLETE    MYOCARDIAL PERFUSION IMAGING    isosorbide mononitrate (IMDUR) 30 MG 24 hr tablet    3. Primary hypertension  I10 PCV ECHOCARDIOGRAM COMPLETE    MYOCARDIAL PERFUSION IMAGING    isosorbide mononitrate (IMDUR) 30 MG 24 hr tablet   Blood pressure is elevated and took her medications.  Will add Imdur 30 mg once a day.    4. Coronary artery disease involving native coronary artery of native heart without angina pectoris  I25.10 PCV ECHOCARDIOGRAM COMPLETE    MYOCARDIAL PERFUSION IMAGING    isosorbide mononitrate (IMDUR) 30 MG 24 hr tablet    5. S/P CABG x 2  Z95.1 PCV ECHOCARDIOGRAM COMPLETE    MYOCARDIAL PERFUSION IMAGING    isosorbide mononitrate (IMDUR) 30 MG 24 hr tablet   Patient has prior history of having CABG and EKG showed normal sinus rhythm with  nonspecific ST-T changes with possible ischemia inferolaterally.    6. Chronic diastolic CHF (congestive heart failure) (HCC)  I50.32 PCV ECHOCARDIOGRAM COMPLETE    MYOCARDIAL PERFUSION IMAGING    isosorbide mononitrate (IMDUR) 30 MG 24 hr tablet    7. Abnormal electrocardiogram (ECG) (EKG)  R94.31 PCV ECHOCARDIOGRAM COMPLETE    MYOCARDIAL PERFUSION IMAGING    isosorbide mononitrate (IMDUR) 30 MG 24 hr tablet       Problem List Items Addressed This Visit       Cardiovascular and Mediastinum   Hypertension (Chronic)   Relevant Medications   isosorbide mononitrate (IMDUR) 30 MG 24 hr tablet   Other Relevant Orders   PCV ECHOCARDIOGRAM COMPLETE   MYOCARDIAL PERFUSION IMAGING   CAD (coronary artery disease), native coronary artery   Relevant Medications   isosorbide mononitrate (IMDUR) 30 MG 24 hr tablet   Other Relevant Orders   PCV ECHOCARDIOGRAM COMPLETE   MYOCARDIAL PERFUSION IMAGING   Chronic diastolic CHF (congestive heart failure) (HCC)   Relevant Medications   isosorbide mononitrate (IMDUR) 30 MG 24 hr tablet   Other Relevant Orders   PCV ECHOCARDIOGRAM COMPLETE   MYOCARDIAL PERFUSION IMAGING     Other   Hyperlipidemia   Relevant Medications   isosorbide mononitrate (IMDUR) 30 MG 24 hr tablet   Other Relevant Orders   PCV ECHOCARDIOGRAM COMPLETE   MYOCARDIAL PERFUSION IMAGING   S/P CABG x 2   Relevant Medications   isosorbide mononitrate (IMDUR) 30 MG 24 hr tablet   Other Relevant Orders   PCV ECHOCARDIOGRAM COMPLETE   MYOCARDIAL PERFUSION IMAGING   Chest pain - Primary   Had pressure type  chest pain and r/o for MI. EKG had ischemia, thus will r/o  ischamia by stress test      Relevant Medications   isosorbide mononitrate (IMDUR) 30 MG 24 hr tablet   Other Relevant Orders   PCV ECHOCARDIOGRAM COMPLETE   MYOCARDIAL PERFUSION IMAGING   Other Visit Diagnoses       Abnormal electrocardiogram (ECG) (EKG)       Relevant Medications   isosorbide mononitrate  (IMDUR) 30 MG 24 hr tablet   Other Relevant Orders   PCV ECHOCARDIOGRAM COMPLETE   MYOCARDIAL PERFUSION IMAGING          Disposition:   Return in about 3 weeks (around 03/16/2023) for echo, stress test and f/u.    Total time spent: 30 minutes  Signed,  Adrian Blackwater, MD  02/23/2023 9:51 AM    [facility]

## 2023-02-23 NOTE — Assessment & Plan Note (Signed)
Had pressure type chest pain and r/o for MI. EKG had ischemia, thus will r/o  ischamia by stress test

## 2023-03-02 ENCOUNTER — Ambulatory Visit (INDEPENDENT_AMBULATORY_CARE_PROVIDER_SITE_OTHER): Payer: BC Managed Care – PPO

## 2023-03-02 DIAGNOSIS — R0789 Other chest pain: Secondary | ICD-10-CM

## 2023-03-02 DIAGNOSIS — I10 Essential (primary) hypertension: Secondary | ICD-10-CM

## 2023-03-02 DIAGNOSIS — I361 Nonrheumatic tricuspid (valve) insufficiency: Secondary | ICD-10-CM

## 2023-03-02 DIAGNOSIS — Z951 Presence of aortocoronary bypass graft: Secondary | ICD-10-CM

## 2023-03-02 DIAGNOSIS — E782 Mixed hyperlipidemia: Secondary | ICD-10-CM

## 2023-03-02 DIAGNOSIS — I351 Nonrheumatic aortic (valve) insufficiency: Secondary | ICD-10-CM

## 2023-03-02 DIAGNOSIS — I340 Nonrheumatic mitral (valve) insufficiency: Secondary | ICD-10-CM

## 2023-03-02 DIAGNOSIS — I2510 Atherosclerotic heart disease of native coronary artery without angina pectoris: Secondary | ICD-10-CM

## 2023-03-02 DIAGNOSIS — I5032 Chronic diastolic (congestive) heart failure: Secondary | ICD-10-CM

## 2023-03-02 DIAGNOSIS — R9431 Abnormal electrocardiogram [ECG] [EKG]: Secondary | ICD-10-CM

## 2023-03-03 ENCOUNTER — Other Ambulatory Visit: Payer: Self-pay | Admitting: Cardiovascular Disease

## 2023-03-13 ENCOUNTER — Encounter: Payer: Self-pay | Admitting: Cardiovascular Disease

## 2023-03-13 ENCOUNTER — Ambulatory Visit (INDEPENDENT_AMBULATORY_CARE_PROVIDER_SITE_OTHER): Payer: BC Managed Care – PPO | Admitting: Cardiovascular Disease

## 2023-03-13 VITALS — BP 119/72 | HR 70 | Ht 69.0 in | Wt 225.4 lb

## 2023-03-13 DIAGNOSIS — I2510 Atherosclerotic heart disease of native coronary artery without angina pectoris: Secondary | ICD-10-CM

## 2023-03-13 DIAGNOSIS — N183 Chronic kidney disease, stage 3 unspecified: Secondary | ICD-10-CM

## 2023-03-13 DIAGNOSIS — I701 Atherosclerosis of renal artery: Secondary | ICD-10-CM | POA: Diagnosis not present

## 2023-03-13 DIAGNOSIS — I480 Paroxysmal atrial fibrillation: Secondary | ICD-10-CM | POA: Diagnosis not present

## 2023-03-13 DIAGNOSIS — I10 Essential (primary) hypertension: Secondary | ICD-10-CM

## 2023-03-13 DIAGNOSIS — R0602 Shortness of breath: Secondary | ICD-10-CM | POA: Diagnosis not present

## 2023-03-13 DIAGNOSIS — I5032 Chronic diastolic (congestive) heart failure: Secondary | ICD-10-CM | POA: Diagnosis not present

## 2023-03-13 MED ORDER — FUROSEMIDE 20 MG PO TABS: ORAL_TABLET | ORAL | 11 refills | Status: AC

## 2023-03-13 NOTE — Progress Notes (Signed)
 Cardiology Office Note   Date:  03/13/2023   ID:  Sonji, Starkes 02-03-1951, MRN [id number]  PCP:  Maribeth Camellia MATSU, MD  Cardiologist:  Denyse Bathe, MD      History of Present Illness: Audrey Wolf is a 73 y.o. female who presents for  Chief Complaint  Patient presents with   Follow-up    Echo results    Shortness of Breath The current episode started in the past 7 days. The problem has been waxing and waning. Associated symptoms include chest pain. Her past medical history is significant for CAD.      Past Medical History:  Diagnosis Date   Arthritis    Asthma    CHF (congestive heart failure) (HCC)    Chronic kidney disease    Coronary artery disease    GERD (gastroesophageal reflux disease)    Headache    Hypertension    Nephrolithiasis    NSTEMI (non-ST elevated myocardial infarction) (HCC)    Postprandial RUQ pain 05/25/2019     Past Surgical History:  Procedure Laterality Date   ABDOMINAL HYSTERECTOMY     APPENDECTOMY     BREAST BIOPSY Left 12/31/2010   neg   CORONARY ARTERY BYPASS GRAFT N/A 05/22/2017   Procedure: CORONARY ARTERY BYPASS GRAFTING (CABG) x 2 WITH ENDOSCOPIC HARVESTING OF RIGHT SAPHENOUS VEIN;  Surgeon: Army Dallas NOVAK, MD;  Location: [facility] OR;  Service: Open Heart Surgery;  Laterality: N/A;   LEFT HEART CATH AND CORONARY ANGIOGRAPHY N/A 05/18/2017   Procedure: LEFT HEART CATH AND CORONARY ANGIOGRAPHY;  Surgeon: Bathe Denyse LABOR, MD;  Location: [facility];  Service: Cardiovascular;  Laterality: N/A;   LITHOTRIPSY     RENAL ANGIOGRAPHY N/A 07/31/2022   Procedure: RENAL ANGIOGRAPHY;  Surgeon: [name], MD;  Location: [facility];  Service: Cardiovascular;  Laterality: N/A;   RENAL ANGIOGRAPHY N/A 10/13/2022   Procedure: RENAL ANGIOGRAPHY;  Surgeon: [name], MD;  Location: [facility];  Service: Cardiovascular;  Laterality: N/A;   TEE WITHOUT CARDIOVERSION N/A 05/22/2017   Procedure:  TRANSESOPHAGEAL ECHOCARDIOGRAM (TEE);  Surgeon: Army Dallas NOVAK, MD;  Location: [facility] OR;  Service: Open Heart Surgery;  Laterality: N/A;   TONSILLECTOMY       Current Outpatient Medications  Medication Sig Dispense Refill   acetaminophen  (TYLENOL ) 500 MG tablet Take 1 tablet (500 mg total) by mouth daily as needed for mild pain or headache. 30 tablet 0   albuterol  (VENTOLIN  HFA) 108 (90 Base) MCG/ACT inhaler SMARTSIG:2 Puff(s) By Mouth Every 4-6 Hours PRN     carvedilol  (COREG ) 25 MG tablet TAKE 1 TABLET BY MOUTH TWICE DAILY WITH MEALS 180 tablet 0   clopidogrel  (PLAVIX ) 75 MG tablet Take 1 tablet (75 mg total) by mouth daily. 30 tablet 11   dapagliflozin  propanediol (FARXIGA ) 10 MG TABS tablet Take 1 tablet (10 mg total) by mouth daily before breakfast. 30 tablet 3   diltiazem  (CARDIZEM  CD) 180 MG 24 hr capsule Take 1 capsule by mouth once daily 90 capsule 0   ELIQUIS  5 MG TABS tablet Take 1 tablet by mouth twice daily 60 tablet 0   famotidine  (PEPCID ) 20 MG tablet Take 20 mg by mouth 2 (two) times daily.     isosorbide  mononitrate (IMDUR ) 30 MG 24 hr tablet Take 1 tablet (30 mg total) by mouth daily. 30 tablet 1   loratadine  (CLARITIN ) 10 MG tablet Take 10 mg by mouth daily.  losartan  (COZAAR ) 100 MG tablet Take 1 tablet (100 mg total) by mouth daily. 30 tablet 11   Multiple Vitamins-Minerals (PRESERVISION AREDS 2) CAPS Take 2 capsules by mouth daily.     rosuvastatin  (CRESTOR ) 20 MG tablet Take 1 tablet by mouth once daily 90 tablet 3   SYMBICORT  80-4.5 MCG/ACT inhaler Inhale 2 puffs by mouth twice daily 11 g 0   vitamin C (ASCORBIC ACID) 250 MG tablet Take 250 mg by mouth daily.     Vitamin D , Ergocalciferol , (DRISDOL) 1.25 MG (50000 UNIT) CAPS capsule Take 50,000 Units by mouth every 7 (seven) days.     hydrALAZINE  (APRESOLINE ) 50 MG tablet Take 1 tablet (50 mg total) by mouth 3 (three) times daily. 270 tablet 3   No current facility-administered medications for this visit.     Allergies:   Lisinopril, Ace inhibitors, and Baclofen     Social History:   reports that she has never smoked. She has never used smokeless tobacco. She reports that she does not drink alcohol and does not use drugs.   Family History:  family history includes Arthritis in her father and mother; Breast cancer in her maternal aunt; Heart disease in her father; Stroke in her father and son; Sudden Cardiac Death in her father.    ROS:     Review of Systems  Constitutional: Negative.   HENT: Negative.    Eyes: Negative.   Respiratory:  Positive for shortness of breath.   Cardiovascular:  Positive for chest pain.  Gastrointestinal: Negative.   Genitourinary: Negative.   Musculoskeletal: Negative.   Skin: Negative.   Neurological: Negative.   Endo/Heme/Allergies: Negative.   Psychiatric/Behavioral: Negative.    All other systems reviewed and are negative.     All other systems are reviewed and negative.    PHYSICAL EXAM: VS:  BP 119/72   Pulse 70   Ht 5' 9 (1.753 m)   Wt 225 lb 6.4 oz (102.2 kg)   SpO2 98%   BMI 33.29 kg/m  , BMI Body mass index is 33.29 kg/m. Last weight:  Wt Readings from Last 3 Encounters:  03/13/23 225 lb 6.4 oz (102.2 kg)  02/23/23 218 lb (98.9 kg)  01/23/23 220 lb (99.8 kg)     Physical Exam Constitutional:      Appearance: Normal appearance.  Cardiovascular:     Rate and Rhythm: Normal rate and regular rhythm.     Heart sounds: Normal heart sounds.  Pulmonary:     Effort: Pulmonary effort is normal.     Breath sounds: Normal breath sounds.  Musculoskeletal:     Right lower leg: No edema.     Left lower leg: No edema.  Neurological:     Mental Status: She is alert.       EKG:   Recent Labs: 12/01/2022: B Natriuretic Peptide 153.5 12/30/2022: ALT 15 01/11/2023: BUN 28; Creatinine, Ser 1.11; Hemoglobin 9.8; Platelets 216; Potassium 3.7; Sodium 140    Lipid Panel    Component Value Date/Time   CHOL 118 05/09/2022 1513    CHOL 153 03/01/2012 1132   TRIG 140 05/09/2022 1513   TRIG 127 03/01/2012 1132   HDL 40 (L) 05/09/2022 1513   HDL 27 (L) 03/01/2012 1132   CHOLHDL 3.0 05/09/2022 1513   VLDL 19.0 01/11/2021 1001   VLDL 25 03/01/2012 1132   LDLCALC 56 05/09/2022 1513   LDLCALC 101 (H) 03/01/2012 1132   LDLDIRECT 64.0 05/17/2019 1058      Other studies Reviewed: Additional  studies/ records that were reviewed today include:  Review of the above records demonstrates:       No data to display            ASSESSMENT AND PLAN:  No diagnosis found.   Problem List Items Addressed This Visit   None      Disposition:   No follow-ups on file.    Total time spent: 30 minutes  Signed,  Denyse Bathe, MD  03/13/2023 10:57 AM    [facility]

## 2023-03-14 ENCOUNTER — Other Ambulatory Visit: Payer: Self-pay | Admitting: Family Medicine

## 2023-03-14 DIAGNOSIS — I151 Hypertension secondary to other renal disorders: Secondary | ICD-10-CM

## 2023-03-22 ENCOUNTER — Other Ambulatory Visit: Payer: Self-pay | Admitting: Cardiovascular Disease

## 2023-03-23 DIAGNOSIS — N1831 Chronic kidney disease, stage 3a: Secondary | ICD-10-CM | POA: Diagnosis not present

## 2023-03-23 DIAGNOSIS — I10 Essential (primary) hypertension: Secondary | ICD-10-CM | POA: Diagnosis not present

## 2023-03-23 DIAGNOSIS — I701 Atherosclerosis of renal artery: Secondary | ICD-10-CM | POA: Diagnosis not present

## 2023-04-15 ENCOUNTER — Other Ambulatory Visit (INDEPENDENT_AMBULATORY_CARE_PROVIDER_SITE_OTHER): Payer: Self-pay | Admitting: Nurse Practitioner

## 2023-04-15 DIAGNOSIS — I701 Atherosclerosis of renal artery: Secondary | ICD-10-CM

## 2023-04-17 ENCOUNTER — Ambulatory Visit (INDEPENDENT_AMBULATORY_CARE_PROVIDER_SITE_OTHER): Payer: BC Managed Care – PPO

## 2023-04-17 ENCOUNTER — Encounter (INDEPENDENT_AMBULATORY_CARE_PROVIDER_SITE_OTHER): Payer: Self-pay | Admitting: Nurse Practitioner

## 2023-04-17 ENCOUNTER — Ambulatory Visit (INDEPENDENT_AMBULATORY_CARE_PROVIDER_SITE_OTHER): Payer: BC Managed Care – PPO | Admitting: Nurse Practitioner

## 2023-04-17 VITALS — BP 151/62 | HR 74 | Resp 16 | Wt 225.4 lb

## 2023-04-17 DIAGNOSIS — E782 Mixed hyperlipidemia: Secondary | ICD-10-CM

## 2023-04-17 DIAGNOSIS — I701 Atherosclerosis of renal artery: Secondary | ICD-10-CM | POA: Diagnosis not present

## 2023-04-17 DIAGNOSIS — I10 Essential (primary) hypertension: Secondary | ICD-10-CM | POA: Diagnosis not present

## 2023-04-19 ENCOUNTER — Encounter (INDEPENDENT_AMBULATORY_CARE_PROVIDER_SITE_OTHER): Payer: Self-pay | Admitting: Nurse Practitioner

## 2023-04-19 NOTE — Progress Notes (Signed)
 Subjective:    Patient ID: Audrey Wolf, female    DOB: 08-10-1950, 73 y.o.   MRN: [id number] Chief Complaint  Patient presents with   Follow-up    3 month renal follow up    The patient returns to the office for followup and review of the noninvasive studies regarding renal vascular hypertension and renal artery stenosis. There have been no interval changes in the patient's blood pressure control.  The patient denies any major changes in their medications.  The patient denies headache or flushing.  No flank or unusual back pain.    There have been no significant changes to the patient's overall health care.  No recent shortening of the patient's walking distance or new symptoms consistent with claudication.  No history of rest pain symptoms. No new ulcers or wounds of the lower extremities have occurred.  The patient denies amaurosis fugax or recent TIA symptoms. There are no recent neurological changes noted. There is no history of DVT, PE or superficial thrombophlebitis. No recent episodes of angina or shortness of breath documented.   Duplex ultrasound of the right renal artery shows evidence of patent renal artery stent with no evidence of significant stenosis.  Left renal artery has no flow but this is also consistent with previous studies.     Review of Systems  All other systems reviewed and are negative.      Objective:   Physical Exam Vitals reviewed.  HENT:     Head: Normocephalic.  Cardiovascular:     Rate and Rhythm: Normal rate.  Pulmonary:     Effort: Pulmonary effort is normal.  Skin:    General: Skin is warm and dry.  Neurological:     Mental Status: She is alert and oriented to person, place, and time.  Psychiatric:        Mood and Affect: Mood normal.        Behavior: Behavior normal.        Thought Content: Thought content normal.        Judgment: Judgment normal.     BP (!) 151/62   Pulse 74   Resp 16   Wt 225 lb 6.4 oz (102.2 kg)    BMI 33.29 kg/m   Past Medical History:  Diagnosis Date   Arthritis    Asthma    CHF (congestive heart failure) (HCC)    Chronic kidney disease    Coronary artery disease    GERD (gastroesophageal reflux disease)    Headache    Hypertension    Nephrolithiasis    NSTEMI (non-ST elevated myocardial infarction) (HCC)    Postprandial RUQ pain 05/25/2019    Social History   Socioeconomic History   Marital status: Single    Spouse name: Not on file   Number of children: Not on file   Years of education: Not on file   Highest education level: 12th grade  Occupational History    Employer: [employer]  Tobacco Use   Smoking status: Never   Smokeless tobacco: Never  Vaping Use   Vaping status: Never Used  Substance and Sexual Activity   Alcohol use: No   Drug use: No   Sexual activity: Not Currently    Partners: Male    Birth control/protection: Post-menopausal  Other Topics Concern   Not on file  Social History Narrative   Divorced    Social Drivers of Health   Financial Resource Strain: Low Risk  (10/06/2022)   Overall Physicist, medical Strain (  CARDIA)    Difficulty of Paying Living Expenses: Not very hard  Food Insecurity: No Food Insecurity (10/20/2022)   Hunger Vital Sign    Worried About Running Out of Food in the Last Year: Never true    Ran Out of Food in the Last Year: Never true  Transportation Needs: No Transportation Needs (01/19/2023)   PRAPARE - Administrator, Civil Service (Medical): No    Lack of Transportation (Non-Medical): No  Physical Activity: Unknown (01/19/2023)   Exercise Vital Sign    Days of Exercise per Week: Patient declined    Minutes of Exercise per Session: 30 min  Stress: Stress Concern Present (01/19/2023)   [name]    Feeling of Stress : To some extent  Social Connections: Unknown (01/19/2023)   Social Connection and Isolation Panel [NHANES]    Frequency  of Communication with Friends and Family: Once a week    Frequency of Social Gatherings with Friends and Family: Patient declined    Attends Religious Services: Never    Database administrator or Organizations: No    Attends Banker Meetings: Never    Marital Status: Divorced  Catering manager Violence: Not At Risk (10/07/2022)   Humiliation, Afraid, Rape, and Kick questionnaire    Fear of Current or Ex-Partner: No    Emotionally Abused: No    Physically Abused: No    Sexually Abused: No    Past Surgical History:  Procedure Laterality Date   ABDOMINAL HYSTERECTOMY     APPENDECTOMY     BREAST BIOPSY Left 12/31/2010   neg   CORONARY ARTERY BYPASS GRAFT N/A 05/22/2017   Procedure: CORONARY ARTERY BYPASS GRAFTING (CABG) x 2 WITH ENDOSCOPIC HARVESTING OF RIGHT SAPHENOUS VEIN;  Surgeon: Delight Ovens, MD;  Location: [facility] OR;  Service: Open Heart Surgery;  Laterality: N/A;   LEFT HEART CATH AND CORONARY ANGIOGRAPHY N/A 05/18/2017   Procedure: LEFT HEART CATH AND CORONARY ANGIOGRAPHY;  Surgeon: Laurier Nancy, MD;  Location: [facility];  Service: Cardiovascular;  Laterality: N/A;   LITHOTRIPSY     RENAL ANGIOGRAPHY N/A 07/31/2022   Procedure: RENAL ANGIOGRAPHY;  Surgeon: Annice Needy, MD;  Location: [facility];  Service: Cardiovascular;  Laterality: N/A;   RENAL ANGIOGRAPHY N/A 10/13/2022   Procedure: RENAL ANGIOGRAPHY;  Surgeon: Annice Needy, MD;  Location: [facility];  Service: Cardiovascular;  Laterality: N/A;   TEE WITHOUT CARDIOVERSION N/A 05/22/2017   Procedure: TRANSESOPHAGEAL ECHOCARDIOGRAM (TEE);  Surgeon: Delight Ovens, MD;  Location: [facility] OR;  Service: Open Heart Surgery;  Laterality: N/A;   TONSILLECTOMY      Family History  Problem Relation Age of Onset   Arthritis Mother    Arthritis Father    Heart disease Father    Stroke Father    Sudden Cardiac Death Father    Stroke Son    Breast cancer Maternal Aunt     Allergies   Allergen Reactions   Lisinopril Cough   Ace Inhibitors Other (See Comments)    Has been told to avoid these and any diuretics because "left kidney has shrunk up and doesn't work"   Baclofen Other (See Comments)    Urinary frequency        Latest Ref Rng & Units 01/11/2023   11:17 AM 12/30/2022    1:52 AM 12/01/2022    4:14 PM  CBC  WBC 4.0 - 10.5 K/uL  6.5  5.5  6.0   Hemoglobin 12.0 - 15.0 g/dL 9.8  81.1  9.7   Hematocrit 36.0 - 46.0 % 29.9  30.7  29.4   Platelets 150 - 400 K/uL 216  215  254       CMP     Component Value Date/Time   NA 140 01/11/2023 1117   NA 144 08/19/2019 1533   NA 140 03/01/2012 1132   K 3.7 01/11/2023 1117   K 3.5 03/01/2012 1132   CL 111 01/11/2023 1117   CL 108 (H) 03/01/2012 1132   CO2 20 (L) 01/11/2023 1117   CO2 23 03/01/2012 1132   GLUCOSE 153 (H) 01/11/2023 1117   GLUCOSE 92 03/01/2012 1132   BUN 28 (H) 01/11/2023 1117   BUN 20 08/19/2019 1533   BUN 22 (H) 03/01/2012 1132   CREATININE 1.11 (H) 01/11/2023 1117   CREATININE 1.08 (H) 05/09/2022 1513   CALCIUM 8.6 (L) 01/11/2023 1117   CALCIUM 8.6 03/01/2012 1132   PROT 6.3 (L) 12/30/2022 0152   PROT 6.9 03/23/2012 0954   ALBUMIN 3.8 12/30/2022 0152   ALBUMIN 3.7 03/23/2012 0954   AST 16 12/30/2022 0152   AST 25 03/23/2012 0954   ALT 15 12/30/2022 0152   ALT 38 03/23/2012 0954   ALKPHOS 63 12/30/2022 0152   ALKPHOS 130 03/23/2012 0954   BILITOT 0.5 12/30/2022 0152   BILITOT 0.6 03/23/2012 0954   GFR 55.56 (L) 08/26/2022 0857   EGFR 55 (L) 05/09/2022 1513   GFRNONAA 52 (L) 01/11/2023 1117   GFRNONAA >60 03/01/2012 1132     No results found.     Assessment & Plan:   1. Right renal artery stenosis (HCC) (Primary) Recommend:  BP today was acceptable Given patient's atherosclerosis and PAD optimal control of the patient's hypertension is important.  The patient's BP and noninvasive studies support the previous intervention is patent. No further intervention is indicated  at this time.  Therefore the patient  will continue the current medications, no changes at this time.  The primary medical service will continue aggressive antihypertensive therapy as per the [facility] guidelines   Patient will follow-up with duplex ultrasound of the renal arteries as ordered.  2. Essential (primary) hypertension Continue antihypertensive medications as already ordered, these medications have been reviewed and there are no changes at this time.  3. Mixed hyperlipidemia Continue statin as ordered and reviewed, no changes at this time   Current Outpatient Medications on File Prior to Visit  Medication Sig Dispense Refill   acetaminophen (TYLENOL) 500 MG tablet Take 1 tablet (500 mg total) by mouth daily as needed for mild pain or headache. 30 tablet 0   albuterol (VENTOLIN HFA) 108 (90 Base) MCG/ACT inhaler SMARTSIG:2 Puff(s) By Mouth Every 4-6 Hours PRN     carvedilol (COREG) 25 MG tablet TAKE 1 TABLET BY MOUTH TWICE DAILY WITH MEALS 180 tablet 0   clopidogrel (PLAVIX) 75 MG tablet Take 1 tablet (75 mg total) by mouth daily. 30 tablet 11   dapagliflozin propanediol (FARXIGA) 10 MG TABS tablet Take 1 tablet (10 mg total) by mouth daily before breakfast. 30 tablet 3   diltiazem (CARDIZEM CD) 180 MG 24 hr capsule Take 1 capsule by mouth once daily 90 capsule 0   ELIQUIS 5 MG TABS tablet Take 1 tablet by mouth twice daily 60 tablet 0   famotidine (PEPCID) 20 MG tablet Take 20 mg by mouth 2 (two) times daily.     furosemide (LASIX) 20  MG tablet USE dialy only when SOB 30 tablet 11   isosorbide mononitrate (IMDUR) 30 MG 24 hr tablet Take 1 tablet (30 mg total) by mouth daily. 30 tablet 1   loratadine (CLARITIN) 10 MG tablet Take 10 mg by mouth daily.      losartan (COZAAR) 100 MG tablet Take 1 tablet (100 mg total) by mouth daily. 30 tablet 11   Multiple Vitamins-Minerals (PRESERVISION AREDS 2) CAPS Take 2 capsules by mouth daily.     rosuvastatin (CRESTOR) 20 MG tablet Take 1  tablet by mouth once daily 90 tablet 3   SYMBICORT 80-4.5 MCG/ACT inhaler Inhale 2 puffs by mouth twice daily 11 g 0   vitamin C (ASCORBIC ACID) 250 MG tablet Take 250 mg by mouth daily.     Vitamin D, Ergocalciferol, (DRISDOL) 1.25 MG (50000 UNIT) CAPS capsule Take 50,000 Units by mouth every 7 (seven) days.     hydrALAZINE (APRESOLINE) 50 MG tablet Take 1 tablet (50 mg total) by mouth 3 (three) times daily. 270 tablet 3   No current facility-administered medications on file prior to visit.    There are no Patient Instructions on file for this visit. No follow-ups on file.   Georgiana Spinner, NP

## 2023-04-21 ENCOUNTER — Telehealth: Payer: Self-pay | Admitting: Family Medicine

## 2023-04-21 NOTE — Telephone Encounter (Signed)
 Left message that patient's TOC had to be rescheduled to 07/07/2023 @ 10:20am. Patient was ask to call if she needed to be seen sooner for a office visit and if 07/07/23 appt was good for her to come in to do a TOC with Konrad Dolores.

## 2023-05-01 ENCOUNTER — Encounter: Payer: BC Managed Care – PPO | Admitting: Nurse Practitioner

## 2023-05-08 ENCOUNTER — Other Ambulatory Visit: Payer: Self-pay

## 2023-05-08 MED ORDER — ROSUVASTATIN CALCIUM 20 MG PO TABS: ORAL_TABLET | ORAL | 3 refills | Status: AC

## 2023-05-11 ENCOUNTER — Ambulatory Visit (INDEPENDENT_AMBULATORY_CARE_PROVIDER_SITE_OTHER): Payer: BC Managed Care – PPO | Admitting: Cardiovascular Disease

## 2023-05-11 ENCOUNTER — Encounter: Payer: Self-pay | Admitting: Cardiovascular Disease

## 2023-05-11 VITALS — BP 149/80 | HR 75 | Ht 69.0 in | Wt 223.0 lb

## 2023-05-11 DIAGNOSIS — R0602 Shortness of breath: Secondary | ICD-10-CM

## 2023-05-11 DIAGNOSIS — I5032 Chronic diastolic (congestive) heart failure: Secondary | ICD-10-CM

## 2023-05-11 DIAGNOSIS — I10 Essential (primary) hypertension: Secondary | ICD-10-CM

## 2023-05-11 DIAGNOSIS — I2510 Atherosclerotic heart disease of native coronary artery without angina pectoris: Secondary | ICD-10-CM

## 2023-05-11 DIAGNOSIS — I480 Paroxysmal atrial fibrillation: Secondary | ICD-10-CM

## 2023-05-11 MED ORDER — HYDRALAZINE HCL 100 MG PO TABS: 100.0000 mg | ORAL_TABLET | Freq: Three times a day (TID) | ORAL | 11 refills | Status: AC

## 2023-05-11 NOTE — Progress Notes (Signed)
 Cardiology Office Note   Date:  05/11/2023   ID:  Audrey Wolf, DOB [birth year], MRN [id number]  PCP:  Audrey Luis, MD (Inactive)  Cardiologist:  Adrian Blackwater, MD      History of Present Illness: Audrey Wolf is a 73 y.o. female who presents for  Chief Complaint  Patient presents with   Follow-up    2 month follow up     Doing well      Past Medical History:  Diagnosis Date   Arthritis    Asthma    CHF (congestive heart failure) (HCC)    Chronic kidney disease    Coronary artery disease    GERD (gastroesophageal reflux disease)    Headache    Hypertension    Nephrolithiasis    NSTEMI (non-ST elevated myocardial infarction) (HCC)    Postprandial RUQ pain 05/25/2019     Past Surgical History:  Procedure Laterality Date   ABDOMINAL HYSTERECTOMY     APPENDECTOMY     BREAST BIOPSY Left 12/31/2010   neg   CORONARY ARTERY BYPASS GRAFT N/A 05/22/2017   Procedure: CORONARY ARTERY BYPASS GRAFTING (CABG) x 2 WITH ENDOSCOPIC HARVESTING OF RIGHT SAPHENOUS VEIN;  Surgeon: Delight Ovens, MD;  Location: [facility] OR;  Service: Open Heart Surgery;  Laterality: N/A;   LEFT HEART CATH AND CORONARY ANGIOGRAPHY N/A 05/18/2017   Procedure: LEFT HEART CATH AND CORONARY ANGIOGRAPHY;  Surgeon: Laurier Nancy, MD;  Location: [facility];  Service: Cardiovascular;  Laterality: N/A;   LITHOTRIPSY     RENAL ANGIOGRAPHY N/A 07/31/2022   Procedure: RENAL ANGIOGRAPHY;  Surgeon: Annice Needy, MD;  Location: [facility];  Service: Cardiovascular;  Laterality: N/A;   RENAL ANGIOGRAPHY N/A 10/13/2022   Procedure: RENAL ANGIOGRAPHY;  Surgeon: Annice Needy, MD;  Location: [facility];  Service: Cardiovascular;  Laterality: N/A;   TEE WITHOUT CARDIOVERSION N/A 05/22/2017   Procedure: TRANSESOPHAGEAL ECHOCARDIOGRAM (TEE);  Surgeon: Delight Ovens, MD;  Location: [facility] OR;  Service: Open Heart Surgery;  Laterality: N/A;   TONSILLECTOMY       Current  Outpatient Medications  Medication Sig Dispense Refill   hydrALAZINE (APRESOLINE) 100 MG tablet Take 1 tablet (100 mg total) by mouth 3 (three) times daily. 90 tablet 11   acetaminophen (TYLENOL) 500 MG tablet Take 1 tablet (500 mg total) by mouth daily as needed for mild pain or headache. 30 tablet 0   albuterol (VENTOLIN HFA) 108 (90 Base) MCG/ACT inhaler SMARTSIG:2 Puff(s) By Mouth Every 4-6 Hours PRN     carvedilol (COREG) 25 MG tablet TAKE 1 TABLET BY MOUTH TWICE DAILY WITH MEALS 180 tablet 0   clopidogrel (PLAVIX) 75 MG tablet Take 1 tablet (75 mg total) by mouth daily. 30 tablet 11   dapagliflozin propanediol (FARXIGA) 10 MG TABS tablet Take 1 tablet (10 mg total) by mouth daily before breakfast. 30 tablet 3   diltiazem (CARDIZEM CD) 180 MG 24 hr capsule Take 1 capsule by mouth once daily 90 capsule 0   ELIQUIS 5 MG TABS tablet Take 1 tablet by mouth twice daily 60 tablet 0   famotidine (PEPCID) 20 MG tablet Take 20 mg by mouth 2 (two) times daily.     furosemide (LASIX) 20 MG tablet USE dialy only when SOB 30 tablet 11   isosorbide mononitrate (IMDUR) 30 MG 24 hr tablet Take 1 tablet (30 mg total) by mouth daily. 30 tablet 1   loratadine (CLARITIN) 10  MG tablet Take 10 mg by mouth daily.      losartan (COZAAR) 100 MG tablet Take 1 tablet (100 mg total) by mouth daily. 30 tablet 11   Multiple Vitamins-Minerals (PRESERVISION AREDS 2) CAPS Take 2 capsules by mouth daily.     rosuvastatin (CRESTOR) 20 MG tablet Take 1 tablet by mouth once daily 90 tablet 3   SYMBICORT 80-4.5 MCG/ACT inhaler Inhale 2 puffs by mouth twice daily 11 g 0   vitamin C (ASCORBIC ACID) 250 MG tablet Take 250 mg by mouth daily.     Vitamin D, Ergocalciferol, (DRISDOL) 1.25 MG (50000 UNIT) CAPS capsule Take 50,000 Units by mouth every 7 (seven) days.     No current facility-administered medications for this visit.    Allergies:   Lisinopril, Ace inhibitors, and Baclofen    Social History:   reports that she has  never smoked. She has never used smokeless tobacco. She reports that she does not drink alcohol and does not use drugs.   Family History:  family history includes Arthritis in her father and mother; Breast cancer in her maternal aunt; Heart disease in her father; Stroke in her father and son; Sudden Cardiac Death in her father.    ROS:     Review of Systems  Constitutional: Negative.   HENT: Negative.    Eyes: Negative.   Respiratory: Negative.    Gastrointestinal: Negative.   Genitourinary: Negative.   Musculoskeletal: Negative.   Skin: Negative.   Neurological: Negative.   Endo/Heme/Allergies: Negative.   Psychiatric/Behavioral: Negative.    All other systems reviewed and are negative.     All other systems are reviewed and negative.    PHYSICAL EXAM: VS:  BP (!) 149/80   Pulse 75   Ht 5\' 9"  (1.753 m)   Wt 223 lb (101.2 kg)   SpO2 97%   BMI 32.93 kg/m  , BMI Body mass index is 32.93 kg/m. Last weight:  Wt Readings from Last 3 Encounters:  05/11/23 223 lb (101.2 kg)  04/17/23 225 lb 6.4 oz (102.2 kg)  03/13/23 225 lb 6.4 oz (102.2 kg)     Physical Exam Constitutional:      Appearance: Normal appearance.  Cardiovascular:     Rate and Rhythm: Normal rate and regular rhythm.     Heart sounds: Normal heart sounds.  Pulmonary:     Effort: Pulmonary effort is normal.     Breath sounds: Normal breath sounds.  Musculoskeletal:     Right lower leg: No edema.     Left lower leg: No edema.  Neurological:     Mental Status: She is alert.       EKG:   Recent Labs: 12/01/2022: B Natriuretic Peptide 153.5 12/30/2022: ALT 15 01/11/2023: BUN 28; Creatinine, Ser 1.11; Hemoglobin 9.8; Platelets 216; Potassium 3.7; Sodium 140    Lipid Panel    Component Value Date/Time   CHOL 118 05/09/2022 1513   CHOL 153 03/01/2012 1132   TRIG 140 05/09/2022 1513   TRIG 127 03/01/2012 1132   HDL 40 (L) 05/09/2022 1513   HDL 27 (L) 03/01/2012 1132   CHOLHDL 3.0 05/09/2022  1513   VLDL 19.0 01/11/2021 1001   VLDL 25 03/01/2012 1132   LDLCALC 56 05/09/2022 1513   LDLCALC 101 (H) 03/01/2012 1132   LDLDIRECT 64.0 05/17/2019 1058      Other studies Reviewed: Additional studies/ records that were reviewed today include:  Review of the above records demonstrates:  No data to display            ASSESSMENT AND PLAN:    ICD-10-CM   1. SOB (shortness of breath)  R06.02 hydrALAZINE (APRESOLINE) 100 MG tablet   Hs allergiess, but overall no changes.    2. Coronary artery disease involving native coronary artery of native heart without angina pectoris  I25.10 hydrALAZINE (APRESOLINE) 100 MG tablet    3. Primary hypertension  I10 hydrALAZINE (APRESOLINE) 100 MG tablet   repeat BP 170/80, and has been having headaches, will go up to 100 tid hydralzine tid    4. Chronic diastolic CHF (congestive heart failure) (HCC)  I50.32 hydrALAZINE (APRESOLINE) 100 MG tablet    5. Paroxysmal atrial fibrillation (HCC)  I48.0 hydrALAZINE (APRESOLINE) 100 MG tablet       Problem List Items Addressed This Visit       Cardiovascular and Mediastinum   Hypertension (Chronic)   Relevant Medications   hydrALAZINE (APRESOLINE) 100 MG tablet   Paroxysmal atrial fibrillation (HCC) (Chronic)   Relevant Medications   hydrALAZINE (APRESOLINE) 100 MG tablet   CAD (coronary artery disease), native coronary artery   Relevant Medications   hydrALAZINE (APRESOLINE) 100 MG tablet   Chronic diastolic CHF (congestive heart failure) (HCC)   Relevant Medications   hydrALAZINE (APRESOLINE) 100 MG tablet   Other Visit Diagnoses       SOB (shortness of breath)    -  Primary   Hs allergiess, but overall no changes.   Relevant Medications   hydrALAZINE (APRESOLINE) 100 MG tablet          Disposition:   Return in about 2 weeks (around 05/25/2023).    Total time spent: 30 minutes  Signed,  Adrian Blackwater, MD  05/11/2023 9:17 AM    [facility]

## 2023-05-25 ENCOUNTER — Ambulatory Visit: Admitting: Cardiovascular Disease

## 2023-05-29 ENCOUNTER — Encounter: Payer: Self-pay | Admitting: Cardiovascular Disease

## 2023-05-29 ENCOUNTER — Ambulatory Visit (INDEPENDENT_AMBULATORY_CARE_PROVIDER_SITE_OTHER): Admitting: Cardiovascular Disease

## 2023-05-29 VITALS — BP 142/52 | HR 74 | Ht 69.0 in | Wt 218.0 lb

## 2023-05-29 DIAGNOSIS — I480 Paroxysmal atrial fibrillation: Secondary | ICD-10-CM | POA: Diagnosis not present

## 2023-05-29 DIAGNOSIS — Z951 Presence of aortocoronary bypass graft: Secondary | ICD-10-CM

## 2023-05-29 DIAGNOSIS — I5032 Chronic diastolic (congestive) heart failure: Secondary | ICD-10-CM | POA: Diagnosis not present

## 2023-05-29 DIAGNOSIS — I2510 Atherosclerotic heart disease of native coronary artery without angina pectoris: Secondary | ICD-10-CM

## 2023-05-29 DIAGNOSIS — I10 Essential (primary) hypertension: Secondary | ICD-10-CM

## 2023-05-29 DIAGNOSIS — J069 Acute upper respiratory infection, unspecified: Secondary | ICD-10-CM

## 2023-05-29 NOTE — Progress Notes (Addendum)
 Cardiology Office Note   Date:  05/29/2023   ID:  Audrey Wolf, DOB 1950/10/15, MRN [id number]  PCP:  No primary care provider on file.  Cardiologist:  Debborah Fairly, MD      History of Present Illness: Audrey Wolf is a 73 y.o. female who presents for  Chief Complaint  Patient presents with   Follow-up    2 Weeks Follow Up    Has cough and SOB. On amoxcillin with out improvement, cxr ok.      Past Medical History:  Diagnosis Date   Arthritis    Asthma    CHF (congestive heart failure) (HCC)    Chronic kidney disease    Coronary artery disease    GERD (gastroesophageal reflux disease)    Headache    Hypertension    Nephrolithiasis    NSTEMI (non-ST elevated myocardial infarction) (HCC)    Postprandial RUQ pain 05/25/2019     Past Surgical History:  Procedure Laterality Date   ABDOMINAL HYSTERECTOMY     APPENDECTOMY     BREAST BIOPSY Left 12/31/2010   neg   CORONARY ARTERY BYPASS GRAFT N/A 05/22/2017   Procedure: CORONARY ARTERY BYPASS GRAFTING (CABG) x 2 WITH ENDOSCOPIC HARVESTING OF RIGHT SAPHENOUS VEIN;  Surgeon: Norita Beauvais, MD;  Location: [facility] OR;  Service: Open Heart Surgery;  Laterality: N/A;   LEFT HEART CATH AND CORONARY ANGIOGRAPHY N/A 05/18/2017   Procedure: LEFT HEART CATH AND CORONARY ANGIOGRAPHY;  Surgeon: Cherrie Cornwall, MD;  Location: [facility] INV[facility]IVE CV [facility];  Service: Cardiovascular;  Laterality: N/A;   LITHOTRIPSY     RENAL ANGIOGRAPHY N/A 07/31/2022   Procedure: RENAL ANGIOGRAPHY;  Surgeon: Celso College, MD;  Location: [facility];  Service: Cardiovascular;  Laterality: N/A;   RENAL ANGIOGRAPHY N/A 10/13/2022   Procedure: RENAL ANGIOGRAPHY;  Surgeon: Celso College, MD;  Location: [facility];  Service: Cardiovascular;  Laterality: N/A;   TEE WITHOUT CARDIOVERSION N/A 05/22/2017   Procedure: TRANSESOPHAGEAL ECHOCARDIOGRAM (TEE);  Surgeon: Norita Beauvais, MD;  Location: [facility] OR;  Service: Open Heart Surgery;   Laterality: N/A;   TONSILLECTOMY       Current Outpatient Medications  Medication Sig Dispense Refill   acetaminophen  (TYLENOL ) 500 MG tablet Take 1 tablet (500 mg total) by mouth daily as needed for mild pain or headache. 30 tablet 0   albuterol  (VENTOLIN  HFA) 108 (90 Base) MCG/ACT inhaler SMARTSIG:2 Puff(s) By Mouth Every 4-6 Hours PRN     carvedilol  (COREG ) 25 MG tablet TAKE 1 TABLET BY MOUTH TWICE DAILY WITH MEALS 180 tablet 0   clopidogrel  (PLAVIX ) 75 MG tablet Take 1 tablet (75 mg total) by mouth daily. 30 tablet 11   dapagliflozin  propanediol (FARXIGA ) 10 MG TABS tablet Take 1 tablet (10 mg total) by mouth daily before breakfast. 30 tablet 3   diltiazem  (CARDIZEM  CD) 180 MG 24 hr capsule Take 1 capsule by mouth once daily 90 capsule 0   ELIQUIS  5 MG TABS tablet Take 1 tablet by mouth twice daily 60 tablet 0   famotidine  (PEPCID ) 20 MG tablet Take 20 mg by mouth 2 (two) times daily.     furosemide  (LASIX ) 20 MG tablet USE dialy only when SOB 30 tablet 11   hydrALAZINE  (APRESOLINE ) 100 MG tablet Take 1 tablet (100 mg total) by mouth 3 (three) times daily. 90 tablet 11   isosorbide  mononitrate (IMDUR ) 30 MG 24 hr tablet Take 1 tablet (30 mg total) by mouth  daily. 30 tablet 1   loratadine  (CLARITIN ) 10 MG tablet Take 10 mg by mouth daily.      losartan  (COZAAR ) 100 MG tablet Take 1 tablet (100 mg total) by mouth daily. 30 tablet 11   Multiple Vitamins-Minerals (PRESERVISION AREDS 2) CAPS Take 2 capsules by mouth daily.     rosuvastatin  (CRESTOR ) 20 MG tablet Take 1 tablet by mouth once daily 90 tablet 3   SYMBICORT  80-4.5 MCG/ACT inhaler Inhale 2 puffs by mouth twice daily 11 g 0   vitamin C (ASCORBIC ACID) 250 MG tablet Take 250 mg by mouth daily.     Vitamin D , Ergocalciferol , (DRISDOL) 1.25 MG (50000 UNIT) CAPS capsule Take 50,000 Units by mouth every 7 (seven) days.     No current facility-administered medications for this visit.    Allergies:   Lisinopril, Ace inhibitors, and  Baclofen     Social History:   reports that she has never smoked. She has never used smokeless tobacco. She reports that she does not drink alcohol and does not use drugs.   Family History:  family history includes Arthritis in her father and mother; Breast cancer in her maternal aunt; Heart disease in her father; Stroke in her father and son; Sudden Cardiac Death in her father.    ROS:     Review of Systems  Constitutional: Negative.   HENT: Negative.    Eyes: Negative.   Respiratory: Negative.    Gastrointestinal: Negative.   Genitourinary: Negative.   Musculoskeletal: Negative.   Skin: Negative.   Neurological: Negative.   Endo/Heme/Allergies: Negative.   Psychiatric/Behavioral: Negative.    All other systems reviewed and are negative.     All other systems are reviewed and negative.    PHYSICAL EXAM: VS:  BP (!) 142/52   Pulse 74   Ht 5\' 9"  (1.753 m)   Wt 218 lb (98.9 kg)   SpO2 98%   BMI 32.19 kg/m  , BMI Body mass index is 32.19 kg/m. Last weight:  Wt Readings from Last 3 Encounters:  05/29/23 218 lb (98.9 kg)  05/11/23 223 lb (101.2 kg)  04/17/23 225 lb 6.4 oz (102.2 kg)     Physical Exam Constitutional:      Appearance: Normal appearance.  Cardiovascular:     Rate and Rhythm: Normal rate and regular rhythm.     Heart sounds: Normal heart sounds.  Pulmonary:     Effort: Pulmonary effort is normal.     Breath sounds: Normal breath sounds.  Musculoskeletal:     Right lower leg: No edema.     Left lower leg: No edema.  Neurological:     Mental Status: She is alert.       EKG:   Recent Labs: 12/01/2022: B Natriuretic Peptide 153.5 12/30/2022: ALT 15 01/11/2023: BUN 28; Creatinine, Ser 1.11; Hemoglobin 9.8; Platelets 216; Potassium 3.7; Sodium 140    Lipid Panel    Component Value Date/Time   CHOL 118 05/09/2022 1513   CHOL 153 03/01/2012 1132   TRIG 140 05/09/2022 1513   TRIG 127 03/01/2012 1132   HDL 40 (L) 05/09/2022 1513   HDL 27 (L)  03/01/2012 1132   CHOLHDL 3.0 05/09/2022 1513   VLDL 19.0 01/11/2021 1001   VLDL 25 03/01/2012 1132   LDLCALC 56 05/09/2022 1513   LDLCALC 101 (H) 03/01/2012 1132   LDLDIRECT 64.0 05/17/2019 1058      Other studies Reviewed: Additional studies/ records that were reviewed today include:  Review of the above  records demonstrates:       No data to display            ASSESSMENT AND PLAN:    ICD-10-CM   1. Paroxysmal atrial fibrillation (HCC)  I48.0    In NSR    2. Chronic diastolic CHF (congestive heart failure) (HCC)  I50.32    ECHo had diastolic dysfunction, on farxiga     3. Primary hypertension  I10     4. Coronary artery disease involving native coronary artery of native heart without angina pectoris  I25.10     5. S/P CABG x 2  Z95.1     6. Upper respiratory tract infection, unspecified type  J06.9    On amoxicllin/CXR negative.       Problem List Items Addressed This Visit       Cardiovascular and Mediastinum   Hypertension (Chronic)   Paroxysmal atrial fibrillation (HCC) - Primary (Chronic)   CAD (coronary artery disease), native coronary artery   Chronic diastolic CHF (congestive heart failure) (HCC)     Other   S/P CABG x 2   Other Visit Diagnoses       Upper respiratory tract infection, unspecified type       On amoxicllin/CXR negative.          Disposition:   Return in about 3 months (around 08/28/2023) for f/u me 2 month and have her see primary Amber.    Total time spent: 30 minutes  Signed,  Debborah Fairly, MD  05/29/2023 11:37 AM    [facility]

## 2023-05-30 ENCOUNTER — Other Ambulatory Visit: Payer: Self-pay | Admitting: Cardiovascular Disease

## 2023-06-05 ENCOUNTER — Ambulatory Visit: Payer: Self-pay | Admitting: *Deleted

## 2023-06-05 ENCOUNTER — Ambulatory Visit (INDEPENDENT_AMBULATORY_CARE_PROVIDER_SITE_OTHER): Admitting: Nurse Practitioner

## 2023-06-05 ENCOUNTER — Ambulatory Visit: Admitting: Cardiology

## 2023-06-05 ENCOUNTER — Encounter: Payer: Self-pay | Admitting: Nurse Practitioner

## 2023-06-05 VITALS — BP 134/88 | HR 75 | Temp 98.7°F | Ht 69.0 in | Wt 222.2 lb

## 2023-06-05 DIAGNOSIS — M79601 Pain in right arm: Secondary | ICD-10-CM

## 2023-06-05 DIAGNOSIS — R052 Subacute cough: Secondary | ICD-10-CM

## 2023-06-05 MED ORDER — BENZONATATE 100 MG PO CAPS: 100.0000 mg | ORAL_CAPSULE | Freq: Two times a day (BID) | ORAL | 0 refills | Status: DC

## 2023-06-05 MED ORDER — PREDNISONE 20 MG PO TABS: 40.0000 mg | ORAL_TABLET | Freq: Every day | ORAL | 0 refills | Status: AC

## 2023-06-05 NOTE — Telephone Encounter (Signed)
 Copied from CRM #[id number]. Topic: Clinical - Red Word Triage >> Jun 05, 2023  8:16 AM Allyne Areola wrote: Red Word that prompted transfer to Nurse Triage: Patient was seen at an urgent care on Sunday and tested for covid and the flu, both came back negative but patient is still experiencing a cough. Reason for Disposition  Cough has been present for > 3 weeks  Answer Assessment - Initial Assessment Questions 1. ONSET: "When did the cough begin?"      1 1/2 weeks- Z-pack and amoxicillin-UC 4/14  2. SEVERITY: "How bad is the cough today?"      Cough aggravating 3. SPUTUM: "Describe the color of your sputum" (none, dry cough; clear, white, yellow, green)     Unable to get mucus up 4. HEMOPTYSIS: "Are you coughing up any blood?" If so ask: "How much?" (flecks, streaks, tablespoons, etc.)     N/A 5. DIFFICULTY BREATHING: "Are you having difficulty breathing?" If Yes, ask: "How bad is it?" (e.g., mild, moderate, severe)    - MILD: No SOB at rest, mild SOB with walking, speaks normally in sentences, can lie down, no retractions, pulse < 100.    - MODERATE: SOB at rest, SOB with minimal exertion and prefers to sit, cannot lie down flat, speaks in phrases, mild retractions, audible wheezing, pulse 100-120.    - SEVERE: Very SOB at rest, speaks in single words, struggling to breathe, sitting hunched forward, retractions, pulse > 120      Sometimes- mild 6. FEVER: "Do you have a fever?" If Yes, ask: "What is your temperature, how was it measured, and when did it start?"     no 7. CARDIAC HISTORY: "Do you have any history of heart disease?" (e.g., heart attack, congestive heart failure)      Hx heart disease 8. LUNG HISTORY: "Do you have any history of lung disease?"  (e.g., pulmonary embolus, asthma, emphysema)     asthma  10. OTHER SYMPTOMS: "Do you have any other symptoms?" (e.g., runny nose, wheezing, chest pain)       no  Protocols used: Cough - Acute Productive-A-AH

## 2023-06-05 NOTE — Telephone Encounter (Signed)
 Opened in error

## 2023-06-05 NOTE — Telephone Encounter (Signed)
  Chief Complaint: cough- persistent Symptoms: Patient was seen at [facility] 4/14 and treated with antibiotic. Patient reports cough still present- deep in chest- unable to move sputum out.  Frequency: UC 4/14 Pertinent Negatives: Patient denies other symptoms- no fever, nasal congestion, SOB Disposition: [] ED /[] Urgent Care (no appt availability in office) / [x] Appointment(In office/virtual)/ []  [place] [facility]/ [] Home Care/ [] Refused Recommended Disposition /[] [place] [facility]/ []  Follow-up with PCP Additional Notes: Call to office- they will see patient- CAL scheduled appointment

## 2023-06-05 NOTE — Progress Notes (Signed)
 Established Patient Office Visit  Subjective:  Patient ID: Audrey Wolf, female    DOB: [birth year]  Age: 73 y.o. MRN: [id number]  CC:  Chief Complaint  Patient presents with   Cough    Cough x 2 weeks UC visit on Sunday tested Negative for Flu & Covid  Diagnosed with URI and treated with a z pack and amoxicllin  Right upper arm pain x 2 months Pain 9/10 Right upper arm will catch and run down right arm sometimes    HPI  Audrey Wolf is a 73 year old female here for acute visit with a persistent cough and right arm pain.  The cough began on May 17, 2023, and was initially severe. It has improved but remains as a dry cough that worsens at night, disrupting sleep. She has used Robitussin and Nyquil without significant relief. There is no fever or chest congestion, but she experiences ear fullness since yesterday, described as 'talking in a tunnel'. COVID-19 and flu tests were negative two weeks ago.  Right arm pain, particularly in the deltoid area, has been present for a couple of months. The pain worsens with movement and is more bothersome at night and during work. Topical treatments like Salonpas, Bengay, and Icy Hot have not provided relief. The pain limits her ability to lift her arm and feels like a pulling sensation. Her job involves repetitive motion and overhead work, which may exacerbate the pain. She works at [employer].  Past Medical History:  Diagnosis Date   Arthritis    Asthma    CHF (congestive heart failure) (HCC)    Chronic kidney disease    Coronary artery disease    GERD (gastroesophageal reflux disease)    Headache    Hypertension    Nephrolithiasis    NSTEMI (non-ST elevated myocardial infarction) (HCC)    Postprandial RUQ pain 05/25/2019    Past Surgical History:  Procedure Laterality Date   ABDOMINAL HYSTERECTOMY     APPENDECTOMY     BREAST BIOPSY Left 12/31/2010   neg   CORONARY ARTERY BYPASS GRAFT N/A 05/22/2017   Procedure: CORONARY  ARTERY BYPASS GRAFTING (CABG) x 2 WITH ENDOSCOPIC HARVESTING OF RIGHT SAPHENOUS VEIN;  Surgeon: Norita Beauvais, MD;  Location: [facility] OR;  Service: Open Heart Surgery;  Laterality: N/A;   LEFT HEART CATH AND CORONARY ANGIOGRAPHY N/A 05/18/2017   Procedure: LEFT HEART CATH AND CORONARY ANGIOGRAPHY;  Surgeon: Cherrie Cornwall, MD;  Location: [facility];  Service: Cardiovascular;  Laterality: N/A;   LITHOTRIPSY     RENAL ANGIOGRAPHY N/A 07/31/2022   Procedure: RENAL ANGIOGRAPHY;  Surgeon: Celso College, MD;  Location: [facility];  Service: Cardiovascular;  Laterality: N/A;   RENAL ANGIOGRAPHY N/A 10/13/2022   Procedure: RENAL ANGIOGRAPHY;  Surgeon: Celso College, MD;  Location: [facility];  Service: Cardiovascular;  Laterality: N/A;   TEE WITHOUT CARDIOVERSION N/A 05/22/2017   Procedure: TRANSESOPHAGEAL ECHOCARDIOGRAM (TEE);  Surgeon: Norita Beauvais, MD;  Location[facility] OR;  Service: Open Heart Surgery;  Laterality: N/A;   TONSILLECTOMY      Family History  Problem Relation Age of Onset   Arthritis Mother    Arthritis Father    Heart disease Father    Stroke Father    Sudden Cardiac Death Father    Stroke Son    Breast cancer Maternal Aunt     Social History   Socioeconomic History   Marital status: Single  Spouse name: Not on file   Number of children: Not on file   Years of education: Not on file   Highest education level: 12th grade  Occupational History    Employer: [employer]  Tobacco Use   Smoking status: Never   Smokeless tobacco: Never  Vaping Use   Vaping status: Never Used  Substance and Sexual Activity   Alcohol use: No   Drug use: No   Sexual activity: Not Currently    Partners: Male    Birth control/protection: Post-menopausal  Other Topics Concern   Not on file  Social History Narrative   Divorced    Social Drivers of Health   Financial Resource Strain: Low Risk  (10/06/2022)   Overall Financial Resource Strain (CARDIA)     Difficulty of Paying Living Expenses: Not very hard  Food Insecurity: No Food Insecurity (10/20/2022)   Hunger Vital Sign    Worried About Running Out of Food in the Last Year: Never true    Ran Out of Food in the Last Year: Never true  Transportation Needs: No Transportation Needs (01/19/2023)   PRAPARE - Administrator, Civil Service (Medical): No    Lack of Transportation (Non-Medical): No  Physical Activity: Unknown (01/19/2023)   Exercise Vital Sign    Days of Exercise per Week: Patient declined    Minutes of Exercise per Session: 30 min  Stress: Stress Concern Present (01/19/2023)   [name]    Feeling of Stress : To some extent  Social Connections: Unknown (01/19/2023)   Social Connection and Isolation Panel [NHANES]    Frequency of Communication with Friends and Family: Once a week    Frequency of Social Gatherings with Friends and Family: Patient declined    Attends Religious Services: Never    Database administrator or Organizations: No    Attends Banker Meetings: Never    Marital Status: Divorced  Catering manager Violence: Not At Risk (10/07/2022)   Humiliation, Afraid, Rape, and Kick questionnaire    Fear of Current or Ex-Partner: No    Emotionally Abused: No    Physically Abused: No    Sexually Abused: No     Outpatient Medications Prior to Visit  Medication Sig Dispense Refill   acetaminophen  (TYLENOL ) 500 MG tablet Take 1 tablet (500 mg total) by mouth daily as needed for mild pain or headache. 30 tablet 0   albuterol  (VENTOLIN  HFA) 108 (90 Base) MCG/ACT inhaler SMARTSIG:2 Puff(s) By Mouth Every 4-6 Hours PRN     carvedilol  (COREG ) 25 MG tablet TAKE 1 TABLET BY MOUTH TWICE DAILY WITH MEALS 180 tablet 0   clopidogrel  (PLAVIX ) 75 MG tablet Take 1 tablet (75 mg total) by mouth daily. 30 tablet 11   dapagliflozin  propanediol (FARXIGA ) 10 MG TABS tablet Take 1 tablet (10 mg  total) by mouth daily before breakfast. 30 tablet 3   diltiazem  (CARDIZEM  CD) 180 MG 24 hr capsule Take 1 capsule by mouth once daily 90 capsule 0   ELIQUIS  5 MG TABS tablet Take 1 tablet by mouth twice daily 60 tablet 0   famotidine  (PEPCID ) 20 MG tablet Take 20 mg by mouth 2 (two) times daily.     furosemide  (LASIX ) 20 MG tablet USE dialy only when SOB 30 tablet 11   hydrALAZINE  (APRESOLINE ) 100 MG tablet Take 1 tablet (100 mg total) by mouth 3 (three) times daily. 90 tablet 11   isosorbide  mononitrate (IMDUR )  30 MG 24 hr tablet Take 1 tablet (30 mg total) by mouth daily. 30 tablet 1   loratadine  (CLARITIN ) 10 MG tablet Take 10 mg by mouth daily.      losartan  (COZAAR ) 100 MG tablet Take 1 tablet (100 mg total) by mouth daily. 30 tablet 11   Multiple Vitamins-Minerals (PRESERVISION AREDS 2) CAPS Take 2 capsules by mouth daily.     rosuvastatin  (CRESTOR ) 20 MG tablet Take 1 tablet by mouth once daily 90 tablet 3   SYMBICORT  80-4.5 MCG/ACT inhaler Inhale 2 puffs by mouth twice daily 11 g 0   vitamin C (ASCORBIC ACID) 250 MG tablet Take 250 mg by mouth daily.     Vitamin D , Ergocalciferol , (DRISDOL) 1.25 MG (50000 UNIT) CAPS capsule Take 50,000 Units by mouth every 7 (seven) days.     No facility-administered medications prior to visit.    Allergies  Allergen Reactions   Lisinopril Cough   Ace Inhibitors Other (See Comments)    Has been told to avoid these and any diuretics because "left kidney has shrunk up and doesn't work"   Baclofen  Other (See Comments)    Urinary frequency     ROS Review of Systems Negative unless indicated in HPI.    Objective:    Physical Exam Constitutional:      Appearance: Normal appearance.  HENT:     Right Ear: Tympanic membrane normal. Tympanic membrane is not erythematous.     Left Ear: Tympanic membrane normal. Tympanic membrane is not erythematous.     Nose:     Right Turbinates: Not enlarged.     Left Turbinates: Not enlarged.     Right  Sinus: No maxillary sinus tenderness or frontal sinus tenderness.     Left Sinus: No maxillary sinus tenderness or frontal sinus tenderness.     Mouth/Throat:     Mouth: Mucous membranes are moist.     Pharynx: No pharyngeal swelling, oropharyngeal exudate or posterior oropharyngeal erythema.     Tonsils: No tonsillar exudate.  Cardiovascular:     Rate and Rhythm: Normal rate and regular rhythm.  Pulmonary:     Effort: Pulmonary effort is normal.     Breath sounds: Normal breath sounds. No stridor. No wheezing.  Musculoskeletal:        General: Tenderness (right shoulder) present.  Neurological:     General: No focal deficit present.     Mental Status: She is alert and oriented to person, place, and time. Mental status is at baseline.  Psychiatric:        Mood and Affect: Mood normal.        Behavior: Behavior normal.        Thought Content: Thought content normal.        Judgment: Judgment normal.     BP 134/88   Pulse 75   Temp 98.7 F (37.1 C)   Ht 5\' 9"  (1.753 m)   Wt 222 lb 3.2 oz (100.8 kg)   SpO2 99%   BMI 32.81 kg/m  Wt Readings from Last 3 Encounters:  06/12/23 222 lb 6.4 oz (100.9 kg)  06/05/23 222 lb 3.2 oz (100.8 kg)  05/29/23 218 lb (98.9 kg)     Health Maintenance  Topic Date Due   DTaP/Tdap/Td (1 - Tdap) Never done   Pneumonia Vaccine 35+ Years old (2 of 2 - PCV) 10/15/2019   MAMMOGRAM  12/24/2019   Colonoscopy  02/03/2022   INFLUENZA VACCINE  09/04/2023   Medicare Annual Wellness (AWV)  10/07/2023   DEXA SCAN  Completed   Hepatitis C Screening  Completed   HPV VACCINES  Aged Out   Meningococcal B Vaccine  Aged Out   COVID-19 Vaccine  Discontinued   Zoster Vaccines- Shingrix  Discontinued    There are no preventive care reminders to display for this patient.  Lab Results  Component Value Date   TSH 5.63 (H) 10/29/2021   Lab Results  Component Value Date   WBC 6.5 01/11/2023   HGB 9.8 (L) 01/11/2023   HCT 29.9 (L) 01/11/2023   MCV 91.7  01/11/2023   PLT 216 01/11/2023   Lab Results  Component Value Date   NA 140 01/11/2023   K 3.7 01/11/2023   CO2 20 (L) 01/11/2023   GLUCOSE 153 (H) 01/11/2023   BUN 28 (H) 01/11/2023   CREATININE 1.11 (H) 01/11/2023   BILITOT 0.5 12/30/2022   ALKPHOS 63 12/30/2022   AST 16 12/30/2022   ALT 15 12/30/2022   PROT 6.3 (L) 12/30/2022   ALBUMIN  3.8 12/30/2022   CALCIUM  8.6 (L) 01/11/2023   ANIONGAP 9 01/11/2023   EGFR 55 (L) 05/09/2022   GFR 55.56 (L) 08/26/2022   Lab Results  Component Value Date   CHOL 118 05/09/2022   Lab Results  Component Value Date   HDL 40 (L) 05/09/2022   Lab Results  Component Value Date   LDLCALC 56 05/09/2022   Lab Results  Component Value Date   TRIG 140 05/09/2022   Lab Results  Component Value Date   CHOLHDL 3.0 05/09/2022   Lab Results  Component Value Date   HGBA1C 5.4 05/09/2022      Assessment & Plan:  Pain of right upper extremity Assessment & Plan: Right shoulder pain, worsened by movement, limited range of motion. Likely related to rotator cuff or muscle strain from repetitive motion. - Recommend physical therapy to improve shoulder mobility and reduce pain, Pt declined at present. - Advise home exercises and use of stretch bands. - Schedule follow-up in one week to reassess shoulder pain.   Subacute cough Assessment & Plan: Persistent dry cough, worsened recently. Treated with antibiotics, improved but persists. Possible bronchitis with airway inflammation. - Prescribe prednisone  to reduce airway inflammation. - Prescribe Tessalon  Perles for cough relief. - Advise salt water gargles in the morning. - Schedule follow-up in one week to reassess cough.   Other orders -     predniSONE ; Take 2 tablets (40 mg total) by mouth daily with breakfast for 5 days.  Dispense: 10 tablet; Refill: 0 -     Benzonatate ; Take 1 capsule (100 mg total) by mouth 2 (two) times daily.  Dispense: 30 capsule; Refill: 0    Follow-up:  Return in about 1 week (around 06/12/2023).   Tushar Enns, NP

## 2023-06-12 ENCOUNTER — Encounter: Payer: Self-pay | Admitting: Nurse Practitioner

## 2023-06-12 ENCOUNTER — Ambulatory Visit (INDEPENDENT_AMBULATORY_CARE_PROVIDER_SITE_OTHER): Admitting: Nurse Practitioner

## 2023-06-12 VITALS — BP 136/76 | HR 65 | Temp 97.9°F | Ht 69.0 in | Wt 222.4 lb

## 2023-06-12 DIAGNOSIS — M79601 Pain in right arm: Secondary | ICD-10-CM

## 2023-06-12 DIAGNOSIS — H938X2 Other specified disorders of left ear: Secondary | ICD-10-CM | POA: Insufficient documentation

## 2023-06-12 DIAGNOSIS — R052 Subacute cough: Secondary | ICD-10-CM | POA: Diagnosis not present

## 2023-06-12 MED ORDER — NEOMYCIN-POLYMYXIN-HC 1 % OT SOLN: OTIC | 0 refills | Status: DC

## 2023-06-12 NOTE — Progress Notes (Signed)
 Established Patient Office Visit  Subjective:  Patient ID: Audrey Wolf, female    DOB: 06-07-1950  Age: 73 y.o. MRN: [id number]  CC:  Chief Complaint  Patient presents with   Acute Visit    1 week follow up    HPI  Audrey Wolf presents for 1 week follow up on cough and shoulder pain. She has a persistent cough that has slightly improved over the past week. A course of prednisone  provided some relief, but the cough continues. She takes cough medication regularly.  She experiences a sensation of ear congestion, with her ears feeling 'stopped up,' which remains unresolved.  The shoulder pain has improved. She has been doing exercise at home.    HPI   Past Medical History:  Diagnosis Date   Arthritis    Asthma    CHF (congestive heart failure) (HCC)    Chronic kidney disease    Coronary artery disease    GERD (gastroesophageal reflux disease)    Headache    Hypertension    Nephrolithiasis    NSTEMI (non-ST elevated myocardial infarction) (HCC)    Postprandial RUQ pain 05/25/2019    Past Surgical History:  Procedure Laterality Date   ABDOMINAL HYSTERECTOMY     APPENDECTOMY     BREAST BIOPSY Left 12/31/2010   neg   CORONARY ARTERY BYPASS GRAFT N/A 05/22/2017   Procedure: CORONARY ARTERY BYPASS GRAFTING (CABG) x 2 WITH ENDOSCOPIC HARVESTING OF RIGHT SAPHENOUS VEIN;  Surgeon: Norita Beauvais, MD;  Location: [facility] OR;  Service: Open Heart Surgery;  Laterality: N/A;   LEFT HEART CATH AND CORONARY ANGIOGRAPHY N/A 05/18/2017   Procedure: LEFT HEART CATH AND CORONARY ANGIOGRAPHY;  Surgeon: Cherrie Cornwall, MD;  Location: [facility];  Service: Cardiovascular;  Laterality: N/A;   LITHOTRIPSY     RENAL ANGIOGRAPHY N/A 07/31/2022   Procedure: RENAL ANGIOGRAPHY;  Surgeon: Celso College, MD;  Location: [facility];  Service: Cardiovascular;  Laterality: N/A;   RENAL ANGIOGRAPHY N/A 10/13/2022   Procedure: RENAL ANGIOGRAPHY;  Surgeon: Celso College, MD;   Location: [facility];  Service: Cardiovascular;  Laterality: N/A;   TEE WITHOUT CARDIOVERSION N/A 05/22/2017   Procedure: TRANSESOPHAGEAL ECHOCARDIOGRAM (TEE);  Surgeon: Norita Beauvais, MD;  Location: [facility] OR;  Service: Open Heart Surgery;  Laterality: N/A;   TONSILLECTOMY      Family History  Problem Relation Age of Onset   Arthritis Mother    Arthritis Father    Heart disease Father    Stroke Father    Sudden Cardiac Death Father    Stroke Son    Breast cancer Maternal Aunt     Social History   Socioeconomic History   Marital status: Single    Spouse name: Not on file   Number of children: Not on file   Years of education: Not on file   Highest education level: 12th grade  Occupational History    Employer: [employer]  Tobacco Use   Smoking status: Never   Smokeless tobacco: Never  Vaping Use   Vaping status: Never Used  Substance and Sexual Activity   Alcohol use: No   Drug use: No   Sexual activity: Not Currently    Partners: Male    Birth control/protection: Post-menopausal  Other Topics Concern   Not on file  Social History Narrative   Divorced    Social Drivers of Health   Financial Resource Strain: Low Risk  (10/06/2022)  Overall Financial Resource Strain (CARDIA)    Difficulty of Paying Living Expenses: Not very hard  Food Insecurity: No Food Insecurity (10/20/2022)   Hunger Vital Sign    Worried About Running Out of Food in the Last Year: Never true    Ran Out of Food in the Last Year: Never true  Transportation Needs: No Transportation Needs (01/19/2023)   PRAPARE - Administrator, Civil Service (Medical): No    Lack of Transportation (Non-Medical): No  Physical Activity: Unknown (01/19/2023)   Exercise Vital Sign    Days of Exercise per Week: Patient declined    Minutes of Exercise per Session: 30 min  Stress: Stress Concern Present (01/19/2023)   [name]     Feeling of Stress : To some extent  Social Connections: Unknown (01/19/2023)   Social Connection and Isolation Panel [NHANES]    Frequency of Communication with Friends and Family: Once a week    Frequency of Social Gatherings with Friends and Family: Patient declined    Attends Religious Services: Never    Database administrator or Organizations: No    Attends Banker Meetings: Never    Marital Status: Divorced  Catering manager Violence: Not At Risk (10/07/2022)   Humiliation, Afraid, Rape, and Kick questionnaire    Fear of Current or Ex-Partner: No    Emotionally Abused: No    Physically Abused: No    Sexually Abused: No     Outpatient Medications Prior to Visit  Medication Sig Dispense Refill   acetaminophen  (TYLENOL ) 500 MG tablet Take 1 tablet (500 mg total) by mouth daily as needed for mild pain or headache. 30 tablet 0   albuterol  (VENTOLIN  HFA) 108 (90 Base) MCG/ACT inhaler SMARTSIG:2 Puff(s) By Mouth Every 4-6 Hours PRN     benzonatate  (TESSALON  PERLES) 100 MG capsule Take 1 capsule (100 mg total) by mouth 2 (two) times daily. 30 capsule 0   carvedilol  (COREG ) 25 MG tablet TAKE 1 TABLET BY MOUTH TWICE DAILY WITH MEALS 180 tablet 0   clopidogrel  (PLAVIX ) 75 MG tablet Take 1 tablet (75 mg total) by mouth daily. 30 tablet 11   dapagliflozin  propanediol (FARXIGA ) 10 MG TABS tablet Take 1 tablet (10 mg total) by mouth daily before breakfast. 30 tablet 3   diltiazem  (CARDIZEM  CD) 180 MG 24 hr capsule Take 1 capsule by mouth once daily 90 capsule 0   ELIQUIS  5 MG TABS tablet Take 1 tablet by mouth twice daily 60 tablet 0   famotidine  (PEPCID ) 20 MG tablet Take 20 mg by mouth 2 (two) times daily.     furosemide  (LASIX ) 20 MG tablet USE dialy only when SOB 30 tablet 11   hydrALAZINE  (APRESOLINE ) 100 MG tablet Take 1 tablet (100 mg total) by mouth 3 (three) times daily. 90 tablet 11   isosorbide  mononitrate (IMDUR ) 30 MG 24 hr tablet Take 1 tablet (30 mg total) by mouth  daily. 30 tablet 1   loratadine  (CLARITIN ) 10 MG tablet Take 10 mg by mouth daily.      losartan  (COZAAR ) 100 MG tablet Take 1 tablet (100 mg total) by mouth daily. 30 tablet 11   Multiple Vitamins-Minerals (PRESERVISION AREDS 2) CAPS Take 2 capsules by mouth daily.     rosuvastatin  (CRESTOR ) 20 MG tablet Take 1 tablet by mouth once daily 90 tablet 3   SYMBICORT  80-4.5 MCG/ACT inhaler Inhale 2 puffs by mouth twice daily 11 g 0  vitamin C (ASCORBIC ACID) 250 MG tablet Take 250 mg by mouth daily.     Vitamin D , Ergocalciferol , (DRISDOL) 1.25 MG (50000 UNIT) CAPS capsule Take 50,000 Units by mouth every 7 (seven) days.     No facility-administered medications prior to visit.    Allergies  Allergen Reactions   Lisinopril Cough   Ace Inhibitors Other (See Comments)    Has been told to avoid these and any diuretics because "left kidney has shrunk up and doesn't work"   Baclofen  Other (See Comments)    Urinary frequency     ROS Review of Systems Negative unless indicated in HPI.    Objective:     Physical Exam HENT:     Right Ear: Tympanic membrane normal. Tympanic membrane is not erythematous.     Left Ear: Tympanic membrane normal. Tympanic membrane is not erythematous.     Nose:     Right Turbinates: Swollen.     Left Turbinates: Swollen.     Mouth/Throat:     Mouth: Mucous membranes are moist.     Pharynx: No pharyngeal swelling, oropharyngeal exudate or posterior oropharyngeal erythema.     Tonsils: No tonsillar exudate.  Cardiovascular:     Rate and Rhythm: Normal rate and regular rhythm.  Pulmonary:     Effort: Pulmonary effort is normal.     Breath sounds: Normal breath sounds. No stridor. No wheezing.  Neurological:     General: No focal deficit present.     Mental Status: She is oriented to person, place, and time. Mental status is at baseline.  Psychiatric:        Mood and Affect: Mood normal.        Behavior: Behavior normal.        Thought Content: Thought  content normal.        Judgment: Judgment normal.     BP 136/76   Pulse 65   Temp 97.9 F (36.6 C)   Ht 5\' 9"  (1.753 m)   Wt 222 lb 6.4 oz (100.9 kg)   SpO2 99%   BMI 32.84 kg/m  Wt Readings from Last 3 Encounters:  06/12/23 222 lb 6.4 oz (100.9 kg)  06/05/23 222 lb 3.2 oz (100.8 kg)  05/29/23 218 lb (98.9 kg)     Health Maintenance  Topic Date Due   DTaP/Tdap/Td (1 - Tdap) Never done   Pneumonia Vaccine 39+ Years old (2 of 2 - PCV) 10/15/2019   MAMMOGRAM  12/24/2019   Colonoscopy  02/03/2022   INFLUENZA VACCINE  09/04/2023   Medicare Annual Wellness (AWV)  10/07/2023   DEXA SCAN  Completed   Hepatitis C Screening  Completed   HPV VACCINES  Aged Out   Meningococcal B Vaccine  Aged Out   COVID-19 Vaccine  Discontinued   Zoster Vaccines- Shingrix  Discontinued    There are no preventive care reminders to display for this patient.  Lab Results  Component Value Date   TSH 5.63 (H) 10/29/2021   Lab Results  Component Value Date   WBC 6.5 01/11/2023   HGB 9.8 (L) 01/11/2023   HCT 29.9 (L) 01/11/2023   MCV 91.7 01/11/2023   PLT 216 01/11/2023   Lab Results  Component Value Date   NA 140 01/11/2023   K 3.7 01/11/2023   CO2 20 (L) 01/11/2023   GLUCOSE 153 (H) 01/11/2023   BUN 28 (H) 01/11/2023   CREATININE 1.11 (H) 01/11/2023   BILITOT 0.5 12/30/2022   ALKPHOS 63 12/30/2022  AST 16 12/30/2022   ALT 15 12/30/2022   PROT 6.3 (L) 12/30/2022   ALBUMIN  3.8 12/30/2022   CALCIUM  8.6 (L) 01/11/2023   ANIONGAP 9 01/11/2023   EGFR 55 (L) 05/09/2022   GFR 55.56 (L) 08/26/2022   Lab Results  Component Value Date   CHOL 118 05/09/2022   Lab Results  Component Value Date   HDL 40 (L) 05/09/2022   Lab Results  Component Value Date   LDLCALC 56 05/09/2022   Lab Results  Component Value Date   TRIG 140 05/09/2022   Lab Results  Component Value Date   CHOLHDL 3.0 05/09/2022   Lab Results  Component Value Date   HGBA1C 5.4 05/09/2022       Assessment & Plan:  Pain of right upper extremity Assessment & Plan: Chronic shoulder pain improved. Pain intermittent and less severe. Benefits from stretching exercises. - Continue stretching exercises as tolerated.   Subacute cough Assessment & Plan: Cough improved post-prednisone . Vitals stable. Continues benzonatate .  She has seasonal allergies and takes zyrtec daily. Continue zytrec. Increase fluid intake.  She will let us  know if symptoms not improving.    Congestion of left ear Assessment & Plan: Persistent left ear congestion without acute findings. - Will try polymixin ear drops.    Other orders -     Neomycin-Polymyxin-HC; 4 gtt in affected ear(s) tid, max of 10 days. Lie with affected ear upward x 5 minutes  Dispense: 10 mL; Refill: 0    Follow-up: Return if symptoms worsen or fail to improve.   Vincie Linn, NP

## 2023-06-12 NOTE — Assessment & Plan Note (Signed)
 Persistent dry cough, worsened recently. Treated with antibiotics, improved but persists. Possible bronchitis with airway inflammation. - Prescribe prednisone  to reduce airway inflammation. - Prescribe Tessalon  Perles for cough relief. - Advise salt water gargles in the morning. - Schedule follow-up in one week to reassess cough.

## 2023-06-12 NOTE — Assessment & Plan Note (Signed)
 Chronic shoulder pain improved. Pain intermittent and less severe. Benefits from stretching exercises. - Continue stretching exercises as tolerated.

## 2023-06-12 NOTE — Assessment & Plan Note (Signed)
 Right shoulder pain, worsened by movement, limited range of motion. Likely related to rotator cuff or muscle strain from repetitive motion. - Recommend physical therapy to improve shoulder mobility and reduce pain, Pt declined at present. - Advise home exercises and use of stretch bands. - Schedule follow-up in one week to reassess shoulder pain.

## 2023-06-12 NOTE — Assessment & Plan Note (Signed)
 Persistent left ear congestion without acute findings. - Will try polymixin ear drops.

## 2023-06-12 NOTE — Assessment & Plan Note (Addendum)
 Cough improved post-prednisone . Vitals stable. Continues benzonatate .  She has seasonal allergies and takes zyrtec daily. Continue zytrec. Increase fluid intake.  She will let us  know if symptoms not improving.

## 2023-06-19 ENCOUNTER — Encounter: Payer: BC Managed Care – PPO | Admitting: Nurse Practitioner

## 2023-06-23 ENCOUNTER — Encounter (INDEPENDENT_AMBULATORY_CARE_PROVIDER_SITE_OTHER): Payer: Self-pay

## 2023-07-07 ENCOUNTER — Encounter: Payer: Self-pay | Admitting: Nurse Practitioner

## 2023-07-07 ENCOUNTER — Ambulatory Visit: Admitting: Nurse Practitioner

## 2023-07-07 ENCOUNTER — Ambulatory Visit: Payer: Self-pay | Admitting: Nurse Practitioner

## 2023-07-07 ENCOUNTER — Ambulatory Visit

## 2023-07-07 ENCOUNTER — Ambulatory Visit (INDEPENDENT_AMBULATORY_CARE_PROVIDER_SITE_OTHER)

## 2023-07-07 VITALS — BP 138/70 | HR 71 | Temp 98.2°F | Ht 69.0 in | Wt 226.4 lb

## 2023-07-07 DIAGNOSIS — H6993 Unspecified Eustachian tube disorder, bilateral: Secondary | ICD-10-CM | POA: Diagnosis not present

## 2023-07-07 DIAGNOSIS — E782 Mixed hyperlipidemia: Secondary | ICD-10-CM | POA: Diagnosis not present

## 2023-07-07 DIAGNOSIS — D649 Anemia, unspecified: Secondary | ICD-10-CM

## 2023-07-07 DIAGNOSIS — J4520 Mild intermittent asthma, uncomplicated: Secondary | ICD-10-CM | POA: Diagnosis not present

## 2023-07-07 DIAGNOSIS — R7303 Prediabetes: Secondary | ICD-10-CM | POA: Diagnosis not present

## 2023-07-07 DIAGNOSIS — M79601 Pain in right arm: Secondary | ICD-10-CM | POA: Diagnosis not present

## 2023-07-07 DIAGNOSIS — I10 Essential (primary) hypertension: Secondary | ICD-10-CM

## 2023-07-07 DIAGNOSIS — I701 Atherosclerosis of renal artery: Secondary | ICD-10-CM

## 2023-07-07 DIAGNOSIS — I480 Paroxysmal atrial fibrillation: Secondary | ICD-10-CM

## 2023-07-07 LAB — LIPID PANEL
Cholesterol: 111 mg/dL (ref 0–200)
HDL: 34.9 mg/dL — ABNORMAL LOW (ref >39.00)
LDL Cholesterol: 44 mg/dL (ref 0–99)
NonHDL: 75.96
Total CHOL/HDL Ratio: 3
Triglycerides: 159 mg/dL — ABNORMAL HIGH (ref 0.0–149.0)
VLDL: 31.8 mg/dL (ref 0.0–40.0)

## 2023-07-07 LAB — COMPREHENSIVE METABOLIC PANEL WITH GFR
ALT: 11 U/L (ref 0–35)
AST: 14 U/L (ref 0–37)
Albumin: 3.9 g/dL (ref 3.5–5.2)
Alkaline Phosphatase: 71 U/L (ref 39–117)
BUN: 23 mg/dL (ref 6–23)
CO2: 24 meq/L (ref 19–32)
Calcium: 8.9 mg/dL (ref 8.4–10.5)
Chloride: 110 meq/L (ref 96–112)
Creatinine, Ser: 0.99 mg/dL (ref 0.40–1.20)
GFR: 56.56 mL/min — ABNORMAL LOW (ref >60.00)
Glucose, Bld: 91 mg/dL (ref 70–99)
Potassium: 3.8 meq/L (ref 3.5–5.1)
Sodium: 141 meq/L (ref 135–145)
Total Bilirubin: 0.3 mg/dL (ref 0.2–1.2)
Total Protein: 6.3 g/dL (ref 6.0–8.3)

## 2023-07-07 LAB — CBC WITH DIFFERENTIAL/PLATELET
Basophils Absolute: 0 10*3/uL (ref 0.0–0.1)
Basophils Relative: 0.8 % (ref 0.0–3.0)
Eosinophils Absolute: 0.1 10*3/uL (ref 0.0–0.7)
Eosinophils Relative: 1.4 % (ref 0.0–5.0)
HCT: 26 % — ABNORMAL LOW (ref 36.0–46.0)
Hemoglobin: 8.2 g/dL — ABNORMAL LOW (ref 12.0–15.0)
Lymphocytes Relative: 31.2 % (ref 12.0–46.0)
Lymphs Abs: 1.5 10*3/uL (ref 0.7–4.0)
MCHC: 31.4 g/dL (ref 30.0–36.0)
MCV: 74.1 fl — ABNORMAL LOW (ref 78.0–100.0)
Monocytes Absolute: 0.5 10*3/uL (ref 0.1–1.0)
Monocytes Relative: 10.8 % (ref 3.0–12.0)
Neutro Abs: 2.7 10*3/uL (ref 1.4–7.7)
Neutrophils Relative %: 55.8 % (ref 43.0–77.0)
Platelets: 259 10*3/uL (ref 150.0–400.0)
RBC: 3.5 Mil/uL — ABNORMAL LOW (ref 3.87–5.11)
RDW: 16 % — ABNORMAL HIGH (ref 11.5–15.5)
WBC: 4.8 10*3/uL (ref 4.0–10.5)

## 2023-07-07 LAB — IBC + FERRITIN
Ferritin: 5.9 ng/mL — ABNORMAL LOW (ref 10.0–291.0)
Iron: 13 ug/dL — ABNORMAL LOW (ref 42–145)
Saturation Ratios: 2.9 % — ABNORMAL LOW (ref 20.0–50.0)
TIBC: 441 ug/dL (ref 250.0–450.0)
Transferrin: 315 mg/dL (ref 212.0–360.0)

## 2023-07-07 LAB — HEMOGLOBIN A1C: Hgb A1c MFr Bld: 6 % (ref 4.6–6.5)

## 2023-07-07 MED ORDER — TRIAMCINOLONE ACETONIDE 55 MCG/ACT NA AERO: INHALATION_SPRAY | NASAL | 2 refills | Status: DC

## 2023-07-07 NOTE — Progress Notes (Signed)
 Audrey Burkitt, NP-C Phone: [phone]  Audrey Wolf is a 73 y.o. female who presents today for transfer of care.   Discussed the use of AI scribe software for clinical note transcription with the patient, who gave verbal consent to proceed.  History of Present Illness   Audrey Wolf is a 73 year old female with a history of heart attack and bypass surgery who presents for transfer of care with ear congestion and arm pain.  She has experienced persistent left ear congestion for the past two weeks, describing it as feeling 'stopped up' and being able to hear her heartbeat in the ear. There is no pain or feeling of fullness. Previous treatments with ear drops have not alleviated the symptoms. She had similar symptoms two years ago following a severe illness, which improved after treatment by an ear, nose, and throat specialist.  She reports right arm pain, specifically in the bicep area, persisting for a couple of months. The pain is exacerbated by all movements, particularly at night, and she has to 'pick it up and move it' when changing positions. Various treatments including exercises, salonpas, icy hot, creams, and a heating pad have not provided relief. Prednisone  was prescribed previously, which only slightly eased the pain. 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  She has a history of a heart attack and bypass surgery two years ago, during which she experienced severe illness with multiple organ involvement, including liver dysfunction and a PICC line for two months. She takes multiple cardiac medications, including Eliquis , diltiazem , losartan , and hydralazine , and reports her blood pressure has been well-controlled recently. She also has a history of COPD and asthma, for which she uses Symbicort .  She experiences daily coughing but denies congestion or sore throat. She uses Flonase  nasal spray and takes allergy medication daily. She  reports shortness of breath. She has undergone extensive testing, including breathing tests, x-rays, ultrasounds, and stress tests.      Social History   Tobacco Use  Smoking Status Never  Smokeless Tobacco Never    Current Outpatient Medications on File Prior to Visit  Medication Sig Dispense Refill   acetaminophen  (TYLENOL ) 500 MG tablet Take 1 tablet (500 mg total) by mouth daily as needed for mild pain or headache. 30 tablet 0   albuterol  (VENTOLIN  HFA) 108 (90 Base) MCG/ACT inhaler SMARTSIG:2 Puff(s) By Mouth Every 4-6 Hours PRN     carvedilol  (COREG ) 25 MG tablet TAKE 1 TABLET BY MOUTH TWICE DAILY WITH MEALS 180 tablet 0   clopidogrel  (PLAVIX ) 75 MG tablet Take 1 tablet (75 mg total) by mouth daily. 30 tablet 11   dapagliflozin  propanediol (FARXIGA ) 10 MG TABS tablet Take 1 tablet (10 mg total) by mouth daily before breakfast. 30 tablet 3   diltiazem  (CARDIZEM  CD) 180 MG 24 hr capsule Take 1 capsule by mouth once daily 90 capsule 0   ELIQUIS  5 MG TABS tablet Take 1 tablet by mouth twice daily 60 tablet 0   famotidine  (PEPCID ) 20 MG tablet Take 20 mg by mouth 2 (two) times daily.     furosemide  (LASIX ) 20 MG tablet USE dialy only when SOB 30 tablet 11   hydrALAZINE  (APRESOLINE ) 100 MG tablet Take 1 tablet (100 mg total) by mouth 3 (three) times daily. 90 tablet 11   isosorbide  mononitrate (IMDUR ) 30 MG 24 hr tablet Take 1 tablet (30 mg total) by mouth daily. 30 tablet 1   loratadine  (CLARITIN ) 10 MG tablet  Take 10 mg by mouth daily.      losartan  (COZAAR ) 100 MG tablet Take 1 tablet (100 mg total) by mouth daily. 30 tablet 11   Multiple Vitamins-Minerals (PRESERVISION AREDS 2) CAPS Take 2 capsules by mouth daily.     NEOMYCIN -POLYMYXIN-HYDROCORTISONE (CORTISPORIN) 1 % SOLN OTIC solution 4 gtt in affected ear(s) tid, max of 10 days. Lie with affected ear upward x 5 minutes 10 mL 0   rosuvastatin  (CRESTOR ) 20 MG tablet Take 1 tablet by mouth once daily 90 tablet 3   SYMBICORT  80-4.5  MCG/ACT inhaler Inhale 2 puffs by mouth twice daily 11 g 0   vitamin C (ASCORBIC ACID) 250 MG tablet Take 250 mg by mouth daily.     Vitamin D , Ergocalciferol , (DRISDOL) 1.25 MG (50000 UNIT) CAPS capsule Take 50,000 Units by mouth every 7 (seven) days.     No current facility-administered medications on file prior to visit.     ROS see history of present illness  Objective  Physical Exam Vitals:   07/07/23 1036  BP: 138/70  Pulse: 71  Temp: 98.2 F (36.8 C)  SpO2: 97%    BP Readings from Last 3 Encounters:  07/20/23 (!) 160/51  07/17/23 (!) 171/70  07/17/23 (!) 185/69   Wt Readings from Last 3 Encounters:  07/17/23 224 lb (101.6 kg)  07/10/23 200 lb (90.7 kg)  07/07/23 226 lb 6.4 oz (102.7 kg)    Physical Exam Constitutional:      General: She is not in acute distress.    Appearance: Normal appearance.  HENT:     Head: Normocephalic.     Right Ear: A middle ear effusion is present.     Left Ear: A middle ear effusion is present.   Cardiovascular:     Rate and Rhythm: Normal rate and regular rhythm.     Heart sounds: Normal heart sounds.  Pulmonary:     Effort: Pulmonary effort is normal.     Breath sounds: Normal breath sounds.   Musculoskeletal:     Right shoulder: Tenderness present. Decreased range of motion.   Skin:    General: Skin is warm and dry.   Neurological:     General: No focal deficit present.     Mental Status: She is alert.   Psychiatric:        Mood and Affect: Mood normal.        Behavior: Behavior normal.      Assessment/Plan: Please see individual problem list.  Pain of right upper extremity Assessment & Plan: Persistent right upper arm pain suggests possible adhesive capsulitis due to limited range of motion and pain with specific movements. An x-ray is ordered to assess for structural abnormalities. Consider an orthopedics referral based on x-ray results. Provide printed exercises for shoulder mobility.  Orders: -     DG  Shoulder Right; Future  Eustachian tube dysfunction, bilateral Assessment & Plan: Left ear fullness and pulsatile tinnitus persist, indicating eustachian tube dysfunction due to fluid accumulation. The right ear remains asymptomatic despite fluid presence. Switch to Nasacort  nasal spray to aid drainage. Continue daily antihistamine.   Orders: -     Triamcinolone  Acetonide; Place 2 sprays in each nostril daily  Dispense: 10.8 mL; Refill: 2  Mild intermittent asthma, unspecified whether complicated Assessment & Plan: Asthma with daily cough and dyspnea persists. She uses Symbicort  regularly. Extensive evaluations with pulmonology show no significant findings. Discussed medication use and alternative management strategies. Follow up with pulmonology as scheduled.  Paroxysmal atrial fibrillation (HCC) Assessment & Plan: Continue current medication regimen. Follow up with Cardiology.   Orders: -     CBC with Differential/Platelet  Primary hypertension Assessment & Plan: Blood pressure is adequately controlled on current medication regimen. Continue Hydralazine , Coreg , Losartan  and Diltiazem .   Orders: -     Comprehensive metabolic panel with GFR  Prediabetes Assessment & Plan: Check A1c today.   Orders: -     Hemoglobin A1c  Mixed hyperlipidemia Assessment & Plan: Managed with Crestor  20 mg daily. Continue. Check lipid panel.   Orders: -     Lipid panel    Return in about 3 months (around 10/07/2023) for Follow up.   Audrey Burkitt, NP-C [facility]

## 2023-07-08 ENCOUNTER — Other Ambulatory Visit: Payer: Self-pay

## 2023-07-08 DIAGNOSIS — R899 Unspecified abnormal finding in specimens from other organs, systems and tissues: Secondary | ICD-10-CM

## 2023-07-08 NOTE — Telephone Encounter (Signed)
 Copied from CRM [id number]. Topic: General - Other >> Jul 08, 2023 10:30 AM Caliyah H wrote: Reason for CRM: Patient returning a missed call from [facility] [place]. >> Jul 08, 2023 10:33 AM Caliyah H wrote: Patient stated she is currently at work and requested a call back after 2:00 PM.

## 2023-07-10 ENCOUNTER — Other Ambulatory Visit: Payer: Self-pay | Admitting: Internal Medicine

## 2023-07-10 ENCOUNTER — Ambulatory Visit: Payer: Self-pay | Admitting: Nurse Practitioner

## 2023-07-10 ENCOUNTER — Other Ambulatory Visit: Payer: Self-pay

## 2023-07-10 ENCOUNTER — Emergency Department
Admission: EM | Admit: 2023-07-10 | Discharge: 2023-07-10 | Disposition: A | Discharge status: Home / Self Care | Attending: Emergency Medicine | Admitting: Emergency Medicine

## 2023-07-10 ENCOUNTER — Ambulatory Visit: Payer: Self-pay

## 2023-07-10 ENCOUNTER — Telehealth: Payer: Self-pay

## 2023-07-10 ENCOUNTER — Other Ambulatory Visit

## 2023-07-10 DIAGNOSIS — Z951 Presence of aortocoronary bypass graft: Secondary | ICD-10-CM | POA: Insufficient documentation

## 2023-07-10 DIAGNOSIS — I10 Essential (primary) hypertension: Secondary | ICD-10-CM | POA: Diagnosis not present

## 2023-07-10 DIAGNOSIS — R899 Unspecified abnormal finding in specimens from other organs, systems and tissues: Secondary | ICD-10-CM

## 2023-07-10 DIAGNOSIS — D509 Iron deficiency anemia, unspecified: Secondary | ICD-10-CM | POA: Insufficient documentation

## 2023-07-10 DIAGNOSIS — Z7901 Long term (current) use of anticoagulants: Secondary | ICD-10-CM | POA: Diagnosis not present

## 2023-07-10 DIAGNOSIS — D649 Anemia, unspecified: Secondary | ICD-10-CM | POA: Diagnosis present

## 2023-07-10 LAB — BASIC METABOLIC PANEL WITH GFR
Anion gap: 6 (ref 5–15)
BUN: 24 mg/dL — ABNORMAL HIGH (ref 8–23)
CO2: 22 mmol/L (ref 22–32)
Calcium: 8.6 mg/dL — ABNORMAL LOW (ref 8.9–10.3)
Chloride: 113 mmol/L — ABNORMAL HIGH (ref 98–111)
Creatinine, Ser: 0.95 mg/dL (ref 0.44–1.00)
GFR, Estimated: >60 mL/min (ref >60)
Glucose, Bld: 94 mg/dL (ref 70–99)
Potassium: 3.8 mmol/L (ref 3.5–5.1)
Sodium: 141 mmol/L (ref 135–145)

## 2023-07-10 LAB — CBC
HCT: 26.3 % — ABNORMAL LOW (ref 36.0–46.0)
Hemoglobin: 8.1 g/dL — ABNORMAL LOW (ref 12.0–15.0)
MCH: 24 pg — ABNORMAL LOW (ref 26.0–34.0)
MCHC: 30.8 g/dL (ref 30.0–36.0)
MCV: 77.8 fL — ABNORMAL LOW (ref 80.0–100.0)
Platelets: 257 10*3/uL (ref 150–400)
RBC: 3.38 MIL/uL — ABNORMAL LOW (ref 3.87–5.11)
RDW: 15.4 % (ref 11.5–15.5)
WBC: 5.8 10*3/uL (ref 4.0–10.5)
nRBC: 0 % (ref 0.0–0.2)

## 2023-07-10 LAB — CBC WITH DIFFERENTIAL/PLATELET
Basophils Absolute: 0 10*3/uL (ref 0.0–0.1)
Basophils Relative: 0.8 % (ref 0.0–3.0)
Eosinophils Absolute: 0.1 10*3/uL (ref 0.0–0.7)
Eosinophils Relative: 3 % (ref 0.0–5.0)
HCT: 25.3 % — ABNORMAL LOW (ref 36.0–46.0)
Hemoglobin: 8 g/dL — CL (ref 12.0–15.0)
Lymphocytes Relative: 36.4 % (ref 12.0–46.0)
Lymphs Abs: 1.7 10*3/uL (ref 0.7–4.0)
MCHC: 31.6 g/dL (ref 30.0–36.0)
MCV: 73.6 fl — ABNORMAL LOW (ref 78.0–100.0)
Monocytes Absolute: 0.5 10*3/uL (ref 0.1–1.0)
Monocytes Relative: 10.5 % (ref 3.0–12.0)
Neutro Abs: 2.3 10*3/uL (ref 1.4–7.7)
Neutrophils Relative %: 49.3 % (ref 43.0–77.0)
Platelets: 240 10*3/uL (ref 150.0–400.0)
RBC: 3.43 Mil/uL — ABNORMAL LOW (ref 3.87–5.11)
RDW: 16.1 % — ABNORMAL HIGH (ref 11.5–15.5)
WBC: 4.7 10*3/uL (ref 4.0–10.5)

## 2023-07-10 LAB — TYPE AND SCREEN
ABO/RH(D): A POS
Antibody Screen: NEGATIVE

## 2023-07-10 NOTE — Telephone Encounter (Signed)
 CRITICAL VALUE STICKER  CRITICAL VALUE: 8.0 hemoglobin  RECEIVER (on-site recipient of call): [place]  DATE & TIME NOTIFIED: 07-10-23 11:23 am  MESSENGER (representative from lab): [email or web address]  MD NOTIFIED: via phone note   TIME OF NOTIFICATION:11:24  RESPONSE:

## 2023-07-10 NOTE — Progress Notes (Signed)
 Spoke to ER doctor regarding severe iron deficient anemia-hemoglobin 8.1/on Plavix  Eliquis -   Please schedule appointment 6/13-MD labs CBC BMP; hold tube-Venofer-day 3-1 unit of blood.   GB

## 2023-07-10 NOTE — Telephone Encounter (Signed)
   FYI Only or Action Required?: FYI only for provider  Patient was last seen in primary care on 07/07/2023 by [name], NP. Called Nurse Triage reporting lab results. Symptoms began n/a. Interventions attempted: Other: n/a. Symptoms are: n/a.  Triage Disposition: Information or Advice Only Call ADVISED TO PROCEED TO ED AS DIRECTED BY PROVIDER, SEE BELOW  Patient/caregiver understands and will follow disposition?: YesCopied from CRM [id number]. Topic: Clinical - Red Word Triage >> Jul 10, 2023 12:29 PM Marlan Silva wrote: Red Word that prompted transfer to Nurse Triage: [name] stating that she had a missed call from [place], per her chart note NP Lorice Roof stated: "I would like for you to go to the ED, your blood counts are low, getting lower and you are on blood thinners. I am concerned you are bleeding from somewhere", per her last mychart message. Reason for Disposition  Health Information question, no triage required and triager able to answer question  Answer Assessment - Initial Assessment Questions 1. REASON FOR CALL or QUESTION: "What is your reason for calling today?" or "How can I best help you?" or "What question do you have that I can help answer?"  Per note from [name], message read to patient:      Please advise her to go to the ED! Her blood counts are low, getting lower and she is on blood thinners. I am concerned she is bleeding from somewhere.  She reluctant to go to ED, but verbalizes understanding  Protocols used: Information Only Call - No Triage-A-AH

## 2023-07-10 NOTE — ED Provider Notes (Addendum)
 [facility] Provider Note    Event Date/Time   First MD Initiated Contact with Patient 07/10/23 1556     (approximate)   History   Sent by PCP, low Hgb   HPI  EIRA ALPERT is a 73 y.o. female with paroxysmal A-fib, hypertension, renal artery stenosis, CABG who comes in with concerns for anemia. Pt reports a history of anemia.  She reports being on iron supplementation.  She reports has been compliant with her iron supplementation that she takes once daily she is not sure of the dosage.  She had does take Eliquis  and she has been taking that as well for stroke prevention for her A-fib.  She reports that she has no symptoms and she was incidentally noted to be more anemic and her doctor made her come into the emergency room.  She states that she does not want to be here and that she wants to be able to go home.  She denies any symptoms at this time.  She denies ever having a colonoscopy.  She denies any back pain, falls or any other concerns   Physical Exam   Triage Vital Signs: ED Triage Vitals  Encounter Vitals Group     BP 07/10/23 1337 (!) 151/64     Systolic BP Percentile --      Diastolic BP Percentile --      Pulse Rate 07/10/23 1337 71     Resp 07/10/23 1339 16     Temp 07/10/23 1337 98.7 F (37.1 C)     Temp Source 07/10/23 1337 Oral     SpO2 07/10/23 1337 99 %     Weight 07/10/23 1338 200 lb (90.7 kg)     Height 07/10/23 1338 5\' 9"  (1.753 m)     Head Circumference --      Peak Flow --      Pain Score 07/10/23 1338 0     Pain Loc --      Pain Education --      Exclude from Growth Chart --     Most recent vital signs: Vitals:   07/10/23 1337 07/10/23 1339  BP: (!) 151/64   Pulse: 71   Resp:  16  Temp: 98.7 F (37.1 C)   SpO2: 99%      General: Awake, no distress.  CV:  Good peripheral perfusion.  Resp:  Normal effort.  Abd:  No distention.  Soft and nontender Other:  Rectal exam with brown stool No bruising on her back.   No back pain.  ED Results / Procedures / Treatments   Labs (all labs ordered are listed, but only abnormal results are displayed) Labs Reviewed  CBC - Abnormal; Notable for the following components:      Result Value   RBC 3.38 (*)    Hemoglobin 8.1 (*)    HCT 26.3 (*)    MCV 77.8 (*)    MCH 24.0 (*)    All other components within normal limits  BASIC METABOLIC PANEL WITH GFR - Abnormal; Notable for the following components:   Chloride 113 (*)    BUN 24 (*)    Calcium  8.6 (*)    All other components within normal limits  TYPE AND SCREEN     PROCEDURES:  Critical Care performed: No  Procedures   MEDICATIONS ORDERED IN ED: Medications - No data to display   IMPRESSION / MDM / ASSESSMENT AND PLAN / ED COURSE  I reviewed the triage vital  signs and the nursing notes.   Patient's presentation is most consistent with acute presentation with potential threat to life or bodily function.   Patient comes in with anemia.  Consider the possibility of GI bleed but she does not have melena on examination.  We discussed patient's need for colonoscopy to ensure no colon cancer that could be causing a slow bleeding.  She reports that previously she has never wanted to have a colonoscopy done and she is not sure if she would want to have that done now but she is willing to follow-up with GI did discuss with them different options.  We also discussed that this could just be from her known chronic anemia and that she can follow-up with hematology to discuss if iron transfusions could be helpful and she is willing to do this.  Offered patient admission to the hospital to monitor her hemoglobin but she states that she wants to go home and does not want to stay in the hospital given she is asymptomatic and her hemoglobins been stable over the past 3 days I think that is reasonable.  We discussed the pros and cons of holding off on her Eliquis  and increased risk for stroke versus increased risk for  bleeding and needing blood transfusion.  She is going to think about it and talk to her doctor to decide what she wants to do.  She denies any falls or back pain to suggest retroperitoneal bleed.  She denies any other bleeding elsewhere and at this time she would like to be discharged home.  BMP shows stable creatinine.  CBC shows hemoglobin of 8.1.  This is stable over the past 3 days.  However it has been downtrending as it was around 10 6 to 7 months ago.  Her LFTs were normal that were checked 3 days ago.  Discussed with Dr Valentine Gasmen who will try to get her in to clinic on Friday for recheck hemoglobin and to discuss possible iron transfusions.      FINAL CLINICAL IMPRESSION(S) / ED DIAGNOSES   Final diagnoses:  Iron deficiency anemia, unspecified iron deficiency anemia type     Rx / DC Orders   ED Discharge Orders     None        Note:  This document was prepared using Dragon voice recognition software and may include unintentional dictation errors.   Lubertha Rush, MD 07/10/23 1651    Lubertha Rush, MD 07/10/23 [phone]

## 2023-07-10 NOTE — Telephone Encounter (Signed)
 Noted in lab tab and sent to provider

## 2023-07-10 NOTE — ED Triage Notes (Signed)
 PCP requested Pt to come to ER for low Hgb. Pt denies dizziness, CP, SOB. Hgb was 8.2 on 6/3 and 8.0 6/6. GCS 15, Ambulatory in triage. PMH: anemia, MI, stent

## 2023-07-10 NOTE — Telephone Encounter (Signed)
 FYI Only or Action Required?: FYI only for provider  Patient was last seen in primary care on 07/07/2023 by [name], NP. Called Nurse Triage reporting Advice Only. Symptoms began N/A. Interventions attempted: Other: N/A. Symptoms are: N/A.  Triage Disposition: Information or Advice Only Call  Patient/caregiver understands and will follow disposition?:             Copied from CRM [id number]. Topic: Clinical - Red Word Triage >> Jul 10, 2023 12:29 PM Audrey Wolf wrote: Red Word that prompted transfer to Nurse Triage: [name] stating that she had a missed call from Audrey Wolf, per her chart note NP Audrey Wolf stated: "I would like for you to go to the ED, your blood counts are low, getting lower and you are on blood thinners. I am concerned you are bleeding from somewhere", per her last mychart message. Reason for Disposition  Health Information question, no triage required and triager able to answer question  Answer Assessment - Initial Assessment Questions 1. REASON FOR CALL or QUESTION: "What is your reason for calling today?" or "How can I best help you?" or "What question do you have that I can help answer?"     Patient's sister called to make provider aware patient is at ED now.  Protocols used: Information Only Call - No Triage-A-AH

## 2023-07-10 NOTE — Discharge Instructions (Signed)
 Your hemoglobin is currently staying above 8 over the past 3 days we discussed admission to the hospital but you have opted to decline and you will follow-up outpatient with oncology/hematology  They will try to fit you in on Friday.  If you do not hear from them please call to confirm your appointment time.  If they are not able to get you in please follow-up with your primary care doctor for recheck hemoglobin next week as well.  You also need to follow-up with GI to discuss possible colonoscopy to rule out any type of colon cancer.  If you develop any symptoms of anemia such as weakness, shortness of breath you should return to the ER for repeat evaluation  We discussed the pros and cons of holding your Eliquis .  You should discuss this further with your primary care doctor but the fact that your blood levels have been stable over the past 3 days while taking it is reassuring.

## 2023-07-10 NOTE — Telephone Encounter (Signed)
 Copied from CRM [id number]. Topic: General - Other >> Jul 10, 2023  1:52 PM Abigail D wrote: Reason for CRM: Patient returning call from [facility], patient is now at the ER.

## 2023-07-13 ENCOUNTER — Other Ambulatory Visit: Payer: Self-pay

## 2023-07-13 ENCOUNTER — Other Ambulatory Visit: Payer: Self-pay | Admitting: *Deleted

## 2023-07-13 DIAGNOSIS — D649 Anemia, unspecified: Secondary | ICD-10-CM

## 2023-07-17 ENCOUNTER — Encounter: Payer: Self-pay | Admitting: Internal Medicine

## 2023-07-17 ENCOUNTER — Inpatient Hospital Stay

## 2023-07-17 ENCOUNTER — Inpatient Hospital Stay: Admitting: Internal Medicine

## 2023-07-17 ENCOUNTER — Inpatient Hospital Stay: Attending: Internal Medicine

## 2023-07-17 VITALS — BP 171/70 | HR 67

## 2023-07-17 VITALS — BP 185/69 | HR 74 | Temp 97.1°F | Resp 16 | Wt 224.0 lb

## 2023-07-17 DIAGNOSIS — D649 Anemia, unspecified: Secondary | ICD-10-CM

## 2023-07-17 DIAGNOSIS — D509 Iron deficiency anemia, unspecified: Secondary | ICD-10-CM | POA: Diagnosis not present

## 2023-07-17 LAB — CBC WITH DIFFERENTIAL (CANCER CENTER ONLY)
Abs Immature Granulocytes: 0.01 10*3/uL (ref 0.00–0.07)
Basophils Absolute: 0 10*3/uL (ref 0.0–0.1)
Basophils Relative: 1 %
Eosinophils Absolute: 0.1 10*3/uL (ref 0.0–0.5)
Eosinophils Relative: 1 %
HCT: 27.4 % — ABNORMAL LOW (ref 36.0–46.0)
Hemoglobin: 8.2 g/dL — ABNORMAL LOW (ref 12.0–15.0)
Immature Granulocytes: 0 %
Lymphocytes Relative: 34 %
Lymphs Abs: 1.6 10*3/uL (ref 0.7–4.0)
MCH: 24.4 pg — ABNORMAL LOW (ref 26.0–34.0)
MCHC: 29.9 g/dL — ABNORMAL LOW (ref 30.0–36.0)
MCV: 81.5 fL (ref 80.0–100.0)
Monocytes Absolute: 0.5 10*3/uL (ref 0.1–1.0)
Monocytes Relative: 10 %
Neutro Abs: 2.6 10*3/uL (ref 1.7–7.7)
Neutrophils Relative %: 54 %
Platelet Count: 231 10*3/uL (ref 150–400)
RBC: 3.36 MIL/uL — ABNORMAL LOW (ref 3.87–5.11)
RDW: 19 % — ABNORMAL HIGH (ref 11.5–15.5)
WBC Count: 4.7 10*3/uL (ref 4.0–10.5)
nRBC: 0 % (ref 0.0–0.2)

## 2023-07-17 LAB — SAMPLE TO BLOOD BANK

## 2023-07-17 LAB — BASIC METABOLIC PANEL - CANCER CENTER ONLY
Anion gap: 9 (ref 5–15)
BUN: 22 mg/dL (ref 8–23)
CO2: 20 mmol/L — ABNORMAL LOW (ref 22–32)
Calcium: 8.6 mg/dL — ABNORMAL LOW (ref 8.9–10.3)
Chloride: 111 mmol/L (ref 98–111)
Creatinine: 0.99 mg/dL (ref 0.44–1.00)
GFR, Estimated: >60 mL/min (ref >60)
Glucose, Bld: 135 mg/dL — ABNORMAL HIGH (ref 70–99)
Potassium: 3.9 mmol/L (ref 3.5–5.1)
Sodium: 140 mmol/L (ref 135–145)

## 2023-07-17 MED ORDER — IRON SUCROSE 20 MG/ML IV SOLN
200.0000 mg | Freq: Once | INTRAVENOUS | Status: AC
  Administered 2023-07-17: 200 mg via INTRAVENOUS

## 2023-07-17 NOTE — Progress Notes (Signed)
 [facility] CONSULT NOTE  Patient Care Team: [name], NP as PCP - General (Nurse Practitioner) Vergia Glasgow, MD as Consulting Physician (Pulmonary Disease) Cherrie Cornwall, MD as Consulting Physician (Cardiology) Vonna Guardian Donald Frost, MD as Referring Physician (Vascular Surgery) Gwyn Leos, MD as Consulting Physician (Oncology)  CHIEF COMPLAINTS/PURPOSE OF CONSULTATION: ANEMIA   HEMATOLOGY HISTORY  # ANEMIA[Hb; MCV-platelets- WBC; Iron sat; ferritin;  GFR- CT/US - ;   I connected with Audrey Wolf on 07/17/23 at 11:00 AM EDT by video enabled telemedicine visit and verified that I am speaking with the correct person using two identifiers.  I discussed the limitations, risks, security and privacy concerns of performing an evaluation and management service by telemedicine and the availability of in-person appointments. I also discussed with the patient that there may be a patient responsible charge related to this service. The patient expressed understanding and agreed to proceed.    Other persons participating in the visit and their role in the encounter: RN/medical reconciliation Patient's location: office Provider's location: home   Latest Reference Range & Units 07/07/23 16:33  Iron 42 - 145 ug/dL 13 (L)  TIBC 409.8 - 119.1 mcg/dL 478.2  Saturation Ratios 20.0 - 50.0 % 2.9 (L)  Ferritin 10.0 - 291.0 ng/mL 5.9 (L)  Transferrin 212.0 - 360.0 mg/dL 956.2   HISTORY OF PRESENTING ILLNESS:  Audrey Wolf 73 y.o.  female pleasant patient with a history of CAD/A-fib on Eliquis  Plavix  and aspirin  been referred to us  for further evaluation of anemia.  Patient was recently admitted to the emergency room for low blood counts.  Patient complains of fatigue.  Otherwise denies any chest pain.   Blood in stools: none;  EGD/colonoscopy-never had colo [don't want- patient preference] Blood in urine:none; Difficulty swallowing:none; solitary  [Dr.Lateef/Drs.Sninksi]  Vaginal bleeding: none  Oral iron: off and on for 50 years.  Prior IV iron infusions: none    Review of Systems  Constitutional:  Positive for malaise/fatigue. Negative for chills, diaphoresis, fever and weight loss.  HENT:  Negative for nosebleeds and sore throat.   Eyes:  Negative for double vision.  Respiratory:  Positive for shortness of breath. Negative for cough, hemoptysis, sputum production and wheezing.   Cardiovascular:  Negative for chest pain, palpitations, orthopnea and leg swelling.  Gastrointestinal:  Negative for abdominal pain, blood in stool, constipation, diarrhea, heartburn, melena, nausea and vomiting.  Genitourinary:  Negative for dysuria, frequency and urgency.  Musculoskeletal:  Negative for back pain and joint pain.  Skin: Negative.  Negative for itching and rash.  Neurological:  Negative for dizziness, tingling, focal weakness, weakness and headaches.  Endo/Heme/Allergies:  Does not bruise/bleed easily.  Psychiatric/Behavioral:  Negative for depression. The patient is not nervous/anxious and does not have insomnia.    MEDICAL HISTORY:  Past Medical History:  Diagnosis Date   Arthritis    Asthma    CHF (congestive heart failure) (HCC)    Chronic kidney disease    Coronary artery disease    GERD (gastroesophageal reflux disease)    Headache    Hypertension    Nephrolithiasis    NSTEMI (non-ST elevated myocardial infarction) (HCC)    Postprandial RUQ pain 05/25/2019    SURGICAL HISTORY: Past Surgical History:  Procedure Laterality Date   ABDOMINAL HYSTERECTOMY     APPENDECTOMY     BREAST BIOPSY Left 12/31/2010   neg   CORONARY ARTERY BYPASS GRAFT N/A 05/22/2017   Procedure: CORONARY ARTERY BYPASS GRAFTING (CABG) x 2 WITH ENDOSCOPIC  HARVESTING OF RIGHT SAPHENOUS VEIN;  Surgeon: Norita Beauvais, MD;  Location: [facility] OR;  Service: Open Heart Surgery;  Laterality: N/A;   LEFT HEART CATH AND CORONARY ANGIOGRAPHY N/A 05/18/2017    Procedure: LEFT HEART CATH AND CORONARY ANGIOGRAPHY;  Surgeon: Cherrie Cornwall, MD;  Location: [facility] INVASIVE CV LAB;  Service: Cardiovascular;  Laterality: N/A;   LITHOTRIPSY     RENAL ANGIOGRAPHY N/A 07/31/2022   Procedure: RENAL ANGIOGRAPHY;  Surgeon: Celso College, MD;  Location: [facility] INVASIVE CV LAB;  Service: Cardiovascular;  Laterality: N/A;   RENAL ANGIOGRAPHY N/A 10/13/2022   Procedure: RENAL ANGIOGRAPHY;  Surgeon: Celso College, MD;  Location: [facility] INVASIVE CV LAB;  Service: Cardiovascular;  Laterality: N/A;   TEE WITHOUT CARDIOVERSION N/A 05/22/2017   Procedure: TRANSESOPHAGEAL ECHOCARDIOGRAM (TEE);  Surgeon: Norita Beauvais, MD;  Location: [facility] OR;  Service: Open Heart Surgery;  Laterality: N/A;   TONSILLECTOMY      SOCIAL HISTORY: Social History   Socioeconomic History   Marital status: Single    Spouse name: Not on file   Number of children: Not on file   Years of education: Not on file   Highest education level: 12th grade  Occupational History    Employer: [employer]  Tobacco Use   Smoking status: Never   Smokeless tobacco: Never  Vaping Use   Vaping status: Never Used  Substance and Sexual Activity   Alcohol use: No   Drug use: No   Sexual activity: Not Currently    Partners: Male    Birth control/protection: Post-menopausal  Other Topics Concern   Not on file  Social History Narrative   Divorced    Social Drivers of Health   Financial Resource Strain: Low Risk  (10/06/2022)   Overall Financial Resource Strain (CARDIA)    Difficulty of Paying Living Expenses: Not very hard  Food Insecurity: No Food Insecurity (10/20/2022)   Hunger Vital Sign    Worried About Running Out of Food in the Last Year: Never true    Ran Out of Food in the Last Year: Never true  Transportation Needs: No Transportation Needs (01/19/2023)   PRAPARE - Administrator, Civil Service (Medical): No    Lack of Transportation (Non-Medical): No  Physical Activity: Unknown  (01/19/2023)   Exercise Vital Sign    Days of Exercise per Week: Patient declined    Minutes of Exercise per Session: 30 min  Stress: Stress Concern Present (01/19/2023)   [name]    Feeling of Stress : To some extent  Social Connections: Unknown (01/19/2023)   Social Connection and Isolation Panel    Frequency of Communication with Friends and Family: Once a week    Frequency of Social Gatherings with Friends and Family: Patient declined    Attends Religious Services: Never    Database administrator or Organizations: No    Attends Banker Meetings: Never    Marital Status: Divorced  Catering manager Violence: Not At Risk (10/07/2022)   Humiliation, Afraid, Rape, and Kick questionnaire    Fear of Current or Ex-Partner: No    Emotionally Abused: No    Physically Abused: No    Sexually Abused: No    FAMILY HISTORY: Family History  Problem Relation Age of Onset   Arthritis Mother    Arthritis Father    Heart disease Father    Stroke Father    Sudden Cardiac Death Father  Stroke Son    Breast cancer Maternal Aunt     ALLERGIES:  is allergic to lisinopril, ace inhibitors, and baclofen .  MEDICATIONS:  Current Outpatient Medications  Medication Sig Dispense Refill   acetaminophen  (TYLENOL ) 500 MG tablet Take 1 tablet (500 mg total) by mouth daily as needed for mild pain or headache. 30 tablet 0   albuterol  (VENTOLIN  HFA) 108 (90 Base) MCG/ACT inhaler SMARTSIG:2 Puff(s) By Mouth Every 4-6 Hours PRN     aspirin  EC 81 MG tablet Take 81 mg by mouth daily. Swallow whole.     carvedilol  (COREG ) 25 MG tablet TAKE 1 TABLET BY MOUTH TWICE DAILY WITH MEALS 180 tablet 0   clopidogrel  (PLAVIX ) 75 MG tablet Take 1 tablet (75 mg total) by mouth daily. 30 tablet 11   dapagliflozin  propanediol (FARXIGA ) 10 MG TABS tablet Take 1 tablet (10 mg total) by mouth daily before breakfast. 30 tablet 3   diltiazem   (CARDIZEM  CD) 180 MG 24 hr capsule Take 1 capsule by mouth once daily 90 capsule 0   ELIQUIS  5 MG TABS tablet Take 1 tablet by mouth twice daily 60 tablet 0   famotidine  (PEPCID ) 20 MG tablet Take 20 mg by mouth 2 (two) times daily.     ferrous sulfate  325 (65 FE) MG EC tablet Take 325 mg by mouth daily with breakfast.     furosemide  (LASIX ) 20 MG tablet USE dialy only when SOB 30 tablet 11   hydrALAZINE  (APRESOLINE ) 100 MG tablet Take 1 tablet (100 mg total) by mouth 3 (three) times daily. 90 tablet 11   isosorbide  mononitrate (IMDUR ) 30 MG 24 hr tablet Take 1 tablet (30 mg total) by mouth daily. 30 tablet 1   loratadine  (CLARITIN ) 10 MG tablet Take 10 mg by mouth daily.      losartan  (COZAAR ) 100 MG tablet Take 1 tablet (100 mg total) by mouth daily. 30 tablet 11   Multiple Vitamins-Minerals (PRESERVISION AREDS 2) CAPS Take 2 capsules by mouth daily.     NEOMYCIN -POLYMYXIN-HYDROCORTISONE (CORTISPORIN) 1 % SOLN OTIC solution 4 gtt in affected ear(s) tid, max of 10 days. Lie with affected ear upward x 5 minutes 10 mL 0   rosuvastatin  (CRESTOR ) 20 MG tablet Take 1 tablet by mouth once daily 90 tablet 3   SYMBICORT  80-4.5 MCG/ACT inhaler Inhale 2 puffs by mouth twice daily 11 g 0   triamcinolone  (NASACORT  ALLERGY 24HR) 55 MCG/ACT AERO nasal inhaler Place 2 sprays in each nostril daily 10.8 mL 2   vitamin C (ASCORBIC ACID) 250 MG tablet Take 250 mg by mouth daily.     Vitamin D , Ergocalciferol , (DRISDOL) 1.25 MG (50000 UNIT) CAPS capsule Take 50,000 Units by mouth every 7 (seven) days.     No current facility-administered medications for this visit.   Facility-Administered Medications Ordered in Other Visits  Medication Dose Route Frequency Provider Last Rate Last Admin   iron sucrose (VENOFER) injection 200 mg  200 mg Intravenous Once Rosslyn Pasion R, MD         PHYSICAL EXAMINATION:   Vitals:   07/17/23 1042  BP: (!) 185/69  Pulse: 74  Resp: 16  Temp: (!) 97.1 F (36.2 C)   SpO2: 99%   Filed Weights   07/17/23 1042  Weight: 224 lb (101.6 kg)   Alert oriented x 3.  No acute distress.  LABORATORY DATA:  I have reviewed the data as listed Lab Results  Component Value Date   WBC 4.7 07/17/2023   HGB 8.2 (  L) 07/17/2023   HCT 27.4 (L) 07/17/2023   MCV 81.5 07/17/2023   PLT 231 07/17/2023   Recent Labs    12/30/22 0152 01/11/23 1117 07/07/23 1104 07/10/23 1341 07/17/23 1019  NA 140 140 141 141 140  K 3.9 3.7 3.8 3.8 3.9  CL 112* 111 110 113* 111  CO2 23 20* 24 22 20*  GLUCOSE 107* 153* 91 94 135*  BUN 34* 28* 23 24* 22  CREATININE 1.01* 1.11* 0.99 0.95 0.99  CALCIUM  8.5* 8.6* 8.9 8.6* 8.6*  GFRNONAA 59* 52*  --  >60 >60  PROT 6.3*  --  6.3  --   --   ALBUMIN  3.8  --  3.9  --   --   AST 16  --  14  --   --   ALT 15  --  11  --   --   ALKPHOS 63  --  71  --   --   BILITOT 0.5  --  0.3  --   --      DG Shoulder Right Result Date: 07/13/2023 CLINICAL DATA:  Right shoulder pain, no known injury, initial encounter EXAM: RIGHT SHOULDER - 2+ VIEW COMPARISON:  None Available. FINDINGS: No acute fracture or dislocation is noted. Mild degenerative changes of the acromioclavicular joint seen. Underlying bony thorax is within normal limits. IMPRESSION: No acute abnormality noted. Electronically Signed   By: Violeta Grey M.D.   On: 07/13/2023 02:50    ASSESSMENT & PLAN:   Symptomatic anemia # Anemia- Hb-symptomatic June 2025- Hb 8/ER].  Severe iron deficiency- Lack of improvement on oral iron.   # Discussed regarding IV iron infusion/Venofer.  #Etiology of iron deficiency: Unclear; I had a long discussion with the patient regarding multiple etiologies of anemia including iron deficiency-which is mainly caused by blood loss Recommend GI evaluation-EGD colonoscopy-especially as patient is on aspirin  Plavix  and Eliquis .  However patient declines any evaluation with GI.  Patient of note he never had a colonoscopy because of preference.  # CAD/ A.fib  [Dr.Khan/Dr.Mclean]- on Eliquis / plavix /asprin.  Chronic GI bleed likely cause of patient's anemia.  Recommend reaching out to cardiology at next visit to discuss the ongoing antiplatelet therapy/Eliquis  in the context of anemia.NO CKD- solitary [Dr.Lateef/Drs.Sninksi]  Thank you  for allowing me to participate in the care of your pleasant patient. Please do not hesitate to contact me with questions or concerns in the interim.   # DISPOSITION: # cancel blood.  # venofer today # weekly venofer x 3  # follow up 2 months-  MD; labs- cbc/bmp; possible venofer- Dr.B  All questions were answered. The patient knows to call the clinic with any problems, questions or concerns.    Gwyn Leos, MD 07/17/2023 11:35 AM

## 2023-07-17 NOTE — Patient Instructions (Signed)

## 2023-07-17 NOTE — Assessment & Plan Note (Addendum)
#   Anemia- Hb-symptomatic June 2025- Hb 8/ER].  Severe iron deficiency- Lack of improvement on oral iron.   # Discussed regarding IV iron infusion/Venofer.  #Etiology of iron deficiency: Unclear; I had a long discussion with the patient regarding multiple etiologies of anemia including iron deficiency-which is mainly caused by blood loss Recommend GI evaluation-EGD colonoscopy-especially as patient is on aspirin  Plavix  and Eliquis .  However patient declines any evaluation with GI.  Patient of note he never had a colonoscopy because of preference.  # CAD/ A.fib [Dr.Khan/Dr.Mclean]- on Eliquis / plavix /asprin.  Chronic GI bleed likely cause of patient's anemia.  Recommend reaching out to cardiology at next visit to discuss the ongoing antiplatelet therapy/Eliquis  in the context of anemia.NO CKD- solitary [Dr.Lateef/Drs.Sninksi]  Thank you  for allowing me to participate in the care of your pleasant patient. Please do not hesitate to contact me with questions or concerns in the interim.   # DISPOSITION: # cancel blood.  # venofer today # weekly venofer x 3  # follow up 2 months-  MD; labs- cbc/bmp; possible venofer- Dr.B

## 2023-07-17 NOTE — Progress Notes (Signed)
 Patient here for hospital follow up; Patient states she is doing fine and voices no concerns at this time.

## 2023-07-20 ENCOUNTER — Inpatient Hospital Stay

## 2023-07-20 ENCOUNTER — Encounter: Payer: Self-pay | Admitting: Nurse Practitioner

## 2023-07-20 VITALS — BP 160/51 | HR 73 | Temp 97.6°F | Resp 16

## 2023-07-20 DIAGNOSIS — D509 Iron deficiency anemia, unspecified: Secondary | ICD-10-CM | POA: Diagnosis not present

## 2023-07-20 DIAGNOSIS — D649 Anemia, unspecified: Secondary | ICD-10-CM

## 2023-07-20 MED ORDER — IRON SUCROSE 20 MG/ML IV SOLN
200.0000 mg | Freq: Once | INTRAVENOUS | Status: AC
  Administered 2023-07-20: 200 mg via INTRAVENOUS
  Filled 2023-07-20: qty 10

## 2023-07-20 NOTE — Assessment & Plan Note (Signed)
 Asthma with daily cough and dyspnea persists. She uses Symbicort  regularly. Extensive evaluations with pulmonology show no significant findings. Discussed medication use and alternative management strategies. Follow up with pulmonology as scheduled.

## 2023-07-20 NOTE — Assessment & Plan Note (Signed)
 Persistent right upper arm pain suggests possible adhesive capsulitis due to limited range of motion and pain with specific movements. An x-ray is ordered to assess for structural abnormalities. Consider an orthopedics referral based on x-ray results. Provide printed exercises for shoulder mobility.

## 2023-07-20 NOTE — Assessment & Plan Note (Signed)
 Blood pressure is adequately controlled on current medication regimen. Continue Hydralazine , Coreg , Losartan  and Diltiazem .

## 2023-07-20 NOTE — Assessment & Plan Note (Signed)
 Left ear fullness and pulsatile tinnitus persist, indicating eustachian tube dysfunction due to fluid accumulation. The right ear remains asymptomatic despite fluid presence. Switch to Nasacort  nasal spray to aid drainage. Continue daily antihistamine.

## 2023-07-20 NOTE — Assessment & Plan Note (Signed)
 Managed with Crestor  20 mg daily. Continue. Check lipid panel.

## 2023-07-20 NOTE — Assessment & Plan Note (Signed)
 Check A1c today.

## 2023-07-20 NOTE — Assessment & Plan Note (Signed)
Continue current medication regimen. Follow up with Cardiology.

## 2023-07-21 ENCOUNTER — Ambulatory Visit (INDEPENDENT_AMBULATORY_CARE_PROVIDER_SITE_OTHER): Admitting: Cardiovascular Disease

## 2023-07-21 ENCOUNTER — Encounter: Payer: Self-pay | Admitting: Cardiovascular Disease

## 2023-07-21 VITALS — BP 169/79 | HR 89 | Ht 69.0 in | Wt 225.0 lb

## 2023-07-21 DIAGNOSIS — I701 Atherosclerosis of renal artery: Secondary | ICD-10-CM

## 2023-07-21 DIAGNOSIS — N183 Chronic kidney disease, stage 3 unspecified: Secondary | ICD-10-CM

## 2023-07-21 DIAGNOSIS — Z951 Presence of aortocoronary bypass graft: Secondary | ICD-10-CM

## 2023-07-21 DIAGNOSIS — I10 Essential (primary) hypertension: Secondary | ICD-10-CM | POA: Diagnosis not present

## 2023-07-21 DIAGNOSIS — I2510 Atherosclerotic heart disease of native coronary artery without angina pectoris: Secondary | ICD-10-CM | POA: Diagnosis not present

## 2023-07-21 DIAGNOSIS — D509 Iron deficiency anemia, unspecified: Secondary | ICD-10-CM

## 2023-07-21 DIAGNOSIS — I480 Paroxysmal atrial fibrillation: Secondary | ICD-10-CM | POA: Diagnosis not present

## 2023-07-21 DIAGNOSIS — I5032 Chronic diastolic (congestive) heart failure: Secondary | ICD-10-CM

## 2023-07-21 NOTE — Progress Notes (Addendum)
 Cardiology Office Note   Date:  07/21/2023   ID:  Audrey Wolf, DOB [birth year], MRN [id number]  PCP:  [name], NP  Cardiologist:  Debborah Fairly, MD      History of Present Illness: Audrey Wolf is a 73 y.o. female who presents for  Chief Complaint  Patient presents with   Follow-up    Hospital follow up    [name] went to ER, for anemia as had hemoglobin of 8.2. She is asymptomatic. Takes plavix  as has PVD and had stents in renal arteries on  07/31/22.      Past Medical History:  Diagnosis Date   Arthritis    Asthma    CHF (congestive heart failure) (HCC)    Chronic kidney disease    Coronary artery disease    GERD (gastroesophageal reflux disease)    Headache    Hypertension    Nephrolithiasis    NSTEMI (non-ST elevated myocardial infarction) (HCC)    Postprandial RUQ pain 05/25/2019     Past Surgical History:  Procedure Laterality Date   ABDOMINAL HYSTERECTOMY     APPENDECTOMY     BREAST BIOPSY Left 12/31/2010   neg   CORONARY ARTERY BYPASS GRAFT N/A 05/22/2017   Procedure: CORONARY ARTERY BYPASS GRAFTING (CABG) x 2 WITH ENDOSCOPIC HARVESTING OF RIGHT SAPHENOUS VEIN;  Surgeon: Norita Beauvais, MD;  Location: [facility] OR;  Service: Open Heart Surgery;  Laterality: N/A;   LEFT HEART CATH AND CORONARY ANGIOGRAPHY N/A 05/18/2017   Procedure: LEFT HEART CATH AND CORONARY ANGIOGRAPHY;  Surgeon: Cherrie Cornwall, MD;  Location: [facility];  Service: Cardiovascular;  Laterality: N/A;   LITHOTRIPSY     RENAL ANGIOGRAPHY N/A 07/31/2022   Procedure: RENAL ANGIOGRAPHY;  Surgeon: Celso College, MD;  Location: [facility];  Service: Cardiovascular;  Laterality: N/A;   RENAL ANGIOGRAPHY N/A 10/13/2022   Procedure: RENAL ANGIOGRAPHY;  Surgeon: Celso College, MD;  Location: [facility];  Service: Cardiovascular;  Laterality: N/A;   TEE WITHOUT CARDIOVERSION N/A 05/22/2017   Procedure: TRANSESOPHAGEAL ECHOCARDIOGRAM (TEE);  Surgeon: Norita Beauvais, MD;  Location: [facility] OR;  Service: Open Heart Surgery;  Laterality: N/A;   TONSILLECTOMY       Current Outpatient Medications  Medication Sig Dispense Refill   acetaminophen  (TYLENOL ) 500 MG tablet Take 1 tablet (500 mg total) by mouth daily as needed for mild pain or headache. 30 tablet 0   albuterol  (VENTOLIN  HFA) 108 (90 Base) MCG/ACT inhaler SMARTSIG:2 Puff(s) By Mouth Every 4-6 Hours PRN     carvedilol  (COREG ) 25 MG tablet TAKE 1 TABLET BY MOUTH TWICE DAILY WITH MEALS 180 tablet 0   dapagliflozin  propanediol (FARXIGA ) 10 MG TABS tablet Take 1 tablet (10 mg total) by mouth daily before breakfast. 30 tablet 3   diltiazem  (CARDIZEM  CD) 180 MG 24 hr capsule Take 1 capsule by mouth once daily 90 capsule 0   ELIQUIS  5 MG TABS tablet Take 1 tablet by mouth twice daily 60 tablet 0   famotidine  (PEPCID ) 20 MG tablet Take 20 mg by mouth 2 (two) times daily.     ferrous sulfate  325 (65 FE) MG EC tablet Take 325 mg by mouth daily with breakfast.     furosemide  (LASIX ) 20 MG tablet USE dialy only when SOB 30 tablet 11   hydrALAZINE  (APRESOLINE ) 100 MG tablet Take 1 tablet (100 mg total) by mouth 3 (three) times daily. 90 tablet 11   isosorbide   mononitrate (IMDUR ) 30 MG 24 hr tablet Take 1 tablet (30 mg total) by mouth daily. 30 tablet 1   loratadine  (CLARITIN ) 10 MG tablet Take 10 mg by mouth daily.      losartan  (COZAAR ) 100 MG tablet Take 1 tablet (100 mg total) by mouth daily. 30 tablet 11   Multiple Vitamins-Minerals (PRESERVISION AREDS 2) CAPS Take 2 capsules by mouth daily.     NEOMYCIN -POLYMYXIN-HYDROCORTISONE (CORTISPORIN) 1 % SOLN OTIC solution 4 gtt in affected ear(s) tid, max of 10 days. Lie with affected ear upward x 5 minutes 10 mL 0   rosuvastatin  (CRESTOR ) 20 MG tablet Take 1 tablet by mouth once daily 90 tablet 3   SYMBICORT  80-4.5 MCG/ACT inhaler Inhale 2 puffs by mouth twice daily 11 g 0   triamcinolone  (NASACORT  ALLERGY 24HR) 55 MCG/ACT AERO nasal inhaler Place 2 sprays in  each nostril daily 10.8 mL 2   vitamin C (ASCORBIC ACID) 250 MG tablet Take 250 mg by mouth daily.     Vitamin D , Ergocalciferol , (DRISDOL) 1.25 MG (50000 UNIT) CAPS capsule Take 50,000 Units by mouth every 7 (seven) days.     No current facility-administered medications for this visit.    Allergies:   Lisinopril, Ace inhibitors, and Baclofen     Social History:   reports that she has never smoked. She has never used smokeless tobacco. She reports that she does not drink alcohol and does not use drugs.   Family History:  family history includes Arthritis in her father and mother; Breast cancer in her maternal aunt; Heart disease in her father; Stroke in her father and son; Sudden Cardiac Death in her father.    ROS:     Review of Systems  Constitutional: Negative.   HENT: Negative.    Eyes: Negative.   Respiratory: Negative.    Gastrointestinal: Negative.   Genitourinary: Negative.   Musculoskeletal: Negative.   Skin: Negative.   Neurological: Negative.   Endo/Heme/Allergies: Negative.   Psychiatric/Behavioral: Negative.    All other systems reviewed and are negative.     All other systems are reviewed and negative.    PHYSICAL EXAM: VS:  BP (!) 169/79   Pulse 89   Ht 5' 9 (1.753 m)   Wt 225 lb (102.1 kg)   SpO2 98%   BMI 33.23 kg/m  , BMI Body mass index is 33.23 kg/m. Last weight:  Wt Readings from Last 3 Encounters:  07/21/23 225 lb (102.1 kg)  07/17/23 224 lb (101.6 kg)  07/10/23 200 lb (90.7 kg)     Physical Exam Constitutional:      Appearance: Normal appearance.   Cardiovascular:     Rate and Rhythm: Normal rate and regular rhythm.     Heart sounds: Normal heart sounds.  Pulmonary:     Effort: Pulmonary effort is normal.     Breath sounds: Normal breath sounds.   Musculoskeletal:     Right lower leg: No edema.     Left lower leg: No edema.   Neurological:     Mental Status: She is alert.       EKG:   Recent Labs: 12/01/2022: B  Natriuretic Peptide 153.5 07/07/2023: ALT 11 07/17/2023: BUN 22; Creatinine 0.99; Hemoglobin 8.2; Platelet Count 231; Potassium 3.9; Sodium 140    Lipid Panel    Component Value Date/Time   CHOL 111 07/07/2023 1104   CHOL 153 03/01/2012 1132   TRIG 159.0 (H) 07/07/2023 1104   TRIG 127 03/01/2012 1132   HDL 34.90 (  L) 07/07/2023 1104   HDL 27 (L) 03/01/2012 1132   CHOLHDL 3 07/07/2023 1104   VLDL 31.8 07/07/2023 1104   VLDL 25 03/01/2012 1132   LDLCALC 44 07/07/2023 1104   LDLCALC 56 05/09/2022 1513   LDLCALC 101 (H) 03/01/2012 1132   LDLDIRECT 64.0 05/17/2019 1058      Other studies Reviewed: Additional studies/ records that were reviewed today include:  Review of the above records demonstrates:       No data to display            ASSESSMENT AND PLAN:    ICD-10-CM   1. Coronary artery disease involving native coronary artery of native heart without angina pectoris  I25.10     2. Chronic diastolic CHF (congestive heart failure) (HCC)  I50.32     3. Primary hypertension  I10     4. Paroxysmal atrial fibrillation (HCC)  I48.0     5. Renal artery stenosis (HCC)  I70.1     6. Stage 3 chronic kidney disease, unspecified whether stage 3a or 3b CKD (HCC)  N18.30     7. S/P CABG x 2  Z95.1    Only eliquis  is needed.    8. Iron deficiency anemia, unspecified iron deficiency anemia type  D50.9    Advise take eliques , stop asprin, may stop plavix  also. Stents in renal artries are close to year ago and plavix  can be stopped .       Problem List Items Addressed This Visit       Cardiovascular and Mediastinum   Hypertension (Chronic)   Paroxysmal atrial fibrillation (HCC) (Chronic)   CAD (coronary artery disease), native coronary artery - Primary   Chronic diastolic CHF (congestive heart failure) (HCC)   Renal artery stenosis (HCC)     Genitourinary   CKD (chronic kidney disease) stage 3, GFR 30-59 ml/min (HCC)     Other   S/P CABG x 2   Other Visit Diagnoses        Iron deficiency anemia, unspecified iron deficiency anemia type       Advise take eliques , stop asprin, may stop plavix  also. Stents in renal artries are close to year ago and plavix  can be stopped .          Disposition:   Return in about 4 weeks (around 08/18/2023).    Total time spent: 35 minutes  Signed,  Debborah Fairly, MD  07/21/2023 3:00 PM    [facility]

## 2023-07-21 NOTE — Addendum Note (Signed)
 Addended by: Debborah Fairly A on: 07/21/2023 03:00 PM   Modules accepted: Orders

## 2023-07-27 ENCOUNTER — Inpatient Hospital Stay

## 2023-07-27 VITALS — BP 178/59 | HR 67 | Temp 97.4°F | Resp 18

## 2023-07-27 DIAGNOSIS — D649 Anemia, unspecified: Secondary | ICD-10-CM

## 2023-07-27 DIAGNOSIS — D509 Iron deficiency anemia, unspecified: Secondary | ICD-10-CM | POA: Diagnosis not present

## 2023-07-27 MED ORDER — IRON SUCROSE 20 MG/ML IV SOLN
200.0000 mg | Freq: Once | INTRAVENOUS | Status: AC
  Administered 2023-07-27: 200 mg via INTRAVENOUS

## 2023-07-27 MED ORDER — SODIUM CHLORIDE 0.9% FLUSH
10.0000 mL | Freq: Once | INTRAVENOUS | Status: AC | PRN
  Administered 2023-07-27: 10 mL
  Filled 2023-07-27: qty 10

## 2023-08-03 ENCOUNTER — Ambulatory Visit: Admitting: Cardiovascular Disease

## 2023-08-03 ENCOUNTER — Inpatient Hospital Stay

## 2023-08-03 VITALS — BP 139/43 | HR 70 | Resp 18

## 2023-08-03 DIAGNOSIS — D509 Iron deficiency anemia, unspecified: Secondary | ICD-10-CM | POA: Diagnosis not present

## 2023-08-03 DIAGNOSIS — D649 Anemia, unspecified: Secondary | ICD-10-CM

## 2023-08-03 MED ORDER — IRON SUCROSE 20 MG/ML IV SOLN
200.0000 mg | Freq: Once | INTRAVENOUS | Status: AC
  Administered 2023-08-03: 200 mg via INTRAVENOUS
  Filled 2023-08-03: qty 10

## 2023-08-17 ENCOUNTER — Telehealth: Payer: Self-pay

## 2023-08-17 NOTE — Telephone Encounter (Signed)
 Copied from CRM [id number]. Topic: Clinical - Lab/Test Results >> Aug 17, 2023 11:51 AM Viola F wrote: Reason for CRM: Patient called to request that her x-ray of shoulder that was done 07/13/23 be put on a disc/cd to bring to her orthopedic doctor. Please call her at [phone] (M)

## 2023-08-17 NOTE — Telephone Encounter (Signed)
 Called and provided pt with the burn and CD report number [phone] to call and request

## 2023-08-21 ENCOUNTER — Encounter: Payer: Self-pay | Admitting: Cardiovascular Disease

## 2023-08-21 ENCOUNTER — Ambulatory Visit (INDEPENDENT_AMBULATORY_CARE_PROVIDER_SITE_OTHER): Admitting: Cardiovascular Disease

## 2023-08-21 VITALS — BP 150/82 | HR 73 | Ht 69.0 in | Wt 222.6 lb

## 2023-08-21 DIAGNOSIS — I10 Essential (primary) hypertension: Secondary | ICD-10-CM | POA: Diagnosis not present

## 2023-08-21 DIAGNOSIS — I5032 Chronic diastolic (congestive) heart failure: Secondary | ICD-10-CM | POA: Diagnosis not present

## 2023-08-21 DIAGNOSIS — I480 Paroxysmal atrial fibrillation: Secondary | ICD-10-CM

## 2023-08-21 DIAGNOSIS — E782 Mixed hyperlipidemia: Secondary | ICD-10-CM

## 2023-08-21 DIAGNOSIS — I2510 Atherosclerotic heart disease of native coronary artery without angina pectoris: Secondary | ICD-10-CM

## 2023-08-21 DIAGNOSIS — I701 Atherosclerosis of renal artery: Secondary | ICD-10-CM

## 2023-08-21 NOTE — Progress Notes (Signed)
 Cardiology Office Note   Date:  08/21/2023   ID:  Audrey Wolf, DOB October 01, 1950, MRN [id number]  PCP:  [name], NP  Cardiologist:  Denyse Bathe, MD      History of Present Illness: Audrey Wolf is a 73 y.o. female who presents for  Chief Complaint  Patient presents with   Follow-up    4 week    Feeling ok.      Past Medical History:  Diagnosis Date   Arthritis    Asthma    CHF (congestive heart failure) (HCC)    Chronic kidney disease    Coronary artery disease    GERD (gastroesophageal reflux disease)    Headache    Hypertension    Nephrolithiasis    NSTEMI (non-ST elevated myocardial infarction) (HCC)    Postprandial RUQ pain 05/25/2019     Past Surgical History:  Procedure Laterality Date   ABDOMINAL HYSTERECTOMY     APPENDECTOMY     BREAST BIOPSY Left 12/31/2010   neg   CORONARY ARTERY BYPASS GRAFT N/A 05/22/2017   Procedure: CORONARY ARTERY BYPASS GRAFTING (CABG) x 2 WITH ENDOSCOPIC HARVESTING OF RIGHT SAPHENOUS VEIN;  Surgeon: Army Dallas NOVAK, MD;  Location: [facility] OR;  Service: Open Heart Surgery;  Laterality: N/A;   LEFT HEART CATH AND CORONARY ANGIOGRAPHY N/A 05/18/2017   Procedure: LEFT HEART CATH AND CORONARY ANGIOGRAPHY;  Surgeon: Bathe Denyse LABOR, MD;  Location: [facility];  Service: Cardiovascular;  Laterality: N/A;   LITHOTRIPSY     RENAL ANGIOGRAPHY N/A 07/31/2022   Procedure: RENAL ANGIOGRAPHY;  Surgeon: [name], MD;  Location: [facility];  Service: Cardiovascular;  Laterality: N/A;   RENAL ANGIOGRAPHY N/A 10/13/2022   Procedure: RENAL ANGIOGRAPHY;  Surgeon: [name], MD;  Location: [facility];  Service: Cardiovascular;  Laterality: N/A;   TEE WITHOUT CARDIOVERSION N/A 05/22/2017   Procedure: TRANSESOPHAGEAL ECHOCARDIOGRAM (TEE);  Surgeon: Army Dallas NOVAK, MD;  Location: [facility] OR;  Service: Open Heart Surgery;  Laterality: N/A;   TONSILLECTOMY       Current Outpatient Medications   Medication Sig Dispense Refill   acetaminophen  (TYLENOL ) 500 MG tablet Take 1 tablet (500 mg total) by mouth daily as needed for mild pain or headache. 30 tablet 0   albuterol  (VENTOLIN  HFA) 108 (90 Base) MCG/ACT inhaler SMARTSIG:2 Puff(s) By Mouth Every 4-6 Hours PRN     carvedilol  (COREG ) 25 MG tablet TAKE 1 TABLET BY MOUTH TWICE DAILY WITH MEALS 180 tablet 0   dapagliflozin  propanediol (FARXIGA ) 10 MG TABS tablet Take 1 tablet (10 mg total) by mouth daily before breakfast. 30 tablet 3   diltiazem  (CARDIZEM  CD) 180 MG 24 hr capsule Take 1 capsule by mouth once daily 90 capsule 0   ELIQUIS  5 MG TABS tablet Take 1 tablet by mouth twice daily 60 tablet 0   famotidine  (PEPCID ) 20 MG tablet Take 20 mg by mouth 2 (two) times daily.     ferrous sulfate  325 (65 FE) MG EC tablet Take 325 mg by mouth daily with breakfast.     furosemide  (LASIX ) 20 MG tablet USE dialy only when SOB 30 tablet 11   hydrALAZINE  (APRESOLINE ) 100 MG tablet Take 1 tablet (100 mg total) by mouth 3 (three) times daily. 90 tablet 11   isosorbide  mononitrate (IMDUR ) 30 MG 24 hr tablet Take 1 tablet (30 mg total) by mouth daily. 30 tablet 1   loratadine  (CLARITIN ) 10 MG tablet Take 10  mg by mouth daily.      losartan  (COZAAR ) 100 MG tablet Take 1 tablet (100 mg total) by mouth daily. 30 tablet 11   Multiple Vitamins-Minerals (PRESERVISION AREDS 2) CAPS Take 2 capsules by mouth daily.     NEOMYCIN -POLYMYXIN-HYDROCORTISONE (CORTISPORIN) 1 % SOLN OTIC solution 4 gtt in affected ear(s) tid, max of 10 days. Lie with affected ear upward x 5 minutes 10 mL 0   rosuvastatin  (CRESTOR ) 20 MG tablet Take 1 tablet by mouth once daily 90 tablet 3   SYMBICORT  80-4.5 MCG/ACT inhaler Inhale 2 puffs by mouth twice daily 11 g 0   triamcinolone  (NASACORT  ALLERGY 24HR) 55 MCG/ACT AERO nasal inhaler Place 2 sprays in each nostril daily 10.8 mL 2   vitamin C (ASCORBIC ACID) 250 MG tablet Take 250 mg by mouth daily.     Vitamin D , Ergocalciferol ,  (DRISDOL) 1.25 MG (50000 UNIT) CAPS capsule Take 50,000 Units by mouth every 7 (seven) days.     No current facility-administered medications for this visit.    Allergies:   Lisinopril, Ace inhibitors, and Baclofen     Social History:   reports that she has never smoked. She has never used smokeless tobacco. She reports that she does not drink alcohol and does not use drugs.   Family History:  family history includes Arthritis in her father and mother; Breast cancer in her maternal aunt; Heart disease in her father; Stroke in her father and son; Sudden Cardiac Death in her father.    ROS:     Review of Systems  Constitutional: Negative.   HENT: Negative.    Eyes: Negative.   Respiratory: Negative.    Gastrointestinal: Negative.   Genitourinary: Negative.   Musculoskeletal: Negative.   Skin: Negative.   Neurological: Negative.   Endo/Heme/Allergies: Negative.   Psychiatric/Behavioral: Negative.    All other systems reviewed and are negative.     All other systems are reviewed and negative.    PHYSICAL EXAM: VS:  BP (!) 150/82   Pulse 73   Ht 5' 9 (1.753 m)   Wt 222 lb 9.6 oz (101 kg)   SpO2 97%   BMI 32.87 kg/m  , BMI Body mass index is 32.87 kg/m. Last weight:  Wt Readings from Last 3 Encounters:  08/21/23 222 lb 9.6 oz (101 kg)  07/21/23 225 lb (102.1 kg)  07/17/23 224 lb (101.6 kg)     Physical Exam Constitutional:      Appearance: Normal appearance.  Cardiovascular:     Rate and Rhythm: Normal rate and regular rhythm.     Heart sounds: Normal heart sounds.  Pulmonary:     Effort: Pulmonary effort is normal.     Breath sounds: Normal breath sounds.  Musculoskeletal:     Right lower leg: No edema.     Left lower leg: No edema.  Neurological:     Mental Status: She is alert.       EKG:   Recent Labs: 12/01/2022: B Natriuretic Peptide 153.5 07/07/2023: ALT 11 07/17/2023: BUN 22; Creatinine 0.99; Hemoglobin 8.2; Platelet Count 231; Potassium 3.9;  Sodium 140    Lipid Panel    Component Value Date/Time   CHOL 111 07/07/2023 1104   CHOL 153 03/01/2012 1132   TRIG 159.0 (H) 07/07/2023 1104   TRIG 127 03/01/2012 1132   HDL 34.90 (L) 07/07/2023 1104   HDL 27 (L) 03/01/2012 1132   CHOLHDL 3 07/07/2023 1104   VLDL 31.8 07/07/2023 1104   VLDL 25 03/01/2012  1132   LDLCALC 44 07/07/2023 1104   LDLCALC 56 05/09/2022 1513   LDLCALC 101 (H) 03/01/2012 1132   LDLDIRECT 64.0 05/17/2019 1058      Other studies Reviewed: Additional studies/ records that were reviewed today include:  Review of the above records demonstrates:       No data to display            ASSESSMENT AND PLAN:    ICD-10-CM   1. Primary hypertension  I10    elevated, but normal at home    2. Coronary artery disease involving native coronary artery of native heart without angina pectoris  I25.10     3. Chronic diastolic CHF (congestive heart failure) (HCC)  I50.32     4. Paroxysmal atrial fibrillation (HCC)  I48.0     5. Renal artery stenosis (HCC)  I70.1     6. Mixed hyperlipidemia  E78.2        Problem List Items Addressed This Visit       Cardiovascular and Mediastinum   Hypertension - Primary (Chronic)   Paroxysmal atrial fibrillation (HCC) (Chronic)   CAD (coronary artery disease), native coronary artery   Chronic diastolic CHF (congestive heart failure) (HCC)   Renal artery stenosis (HCC)     Other   Hyperlipidemia       Disposition:   Return in about 3 months (around 11/21/2023).    Total time spent: 40 minutes  Signed,  Denyse Bathe, MD  08/21/2023 9:40 AM    [facility]

## 2023-08-27 ENCOUNTER — Other Ambulatory Visit: Payer: Self-pay | Admitting: Cardiovascular Disease

## 2023-09-21 ENCOUNTER — Inpatient Hospital Stay: Attending: Internal Medicine

## 2023-09-21 ENCOUNTER — Inpatient Hospital Stay

## 2023-09-21 ENCOUNTER — Encounter: Payer: Self-pay | Admitting: Internal Medicine

## 2023-09-21 ENCOUNTER — Inpatient Hospital Stay (HOSPITAL_BASED_OUTPATIENT_CLINIC_OR_DEPARTMENT_OTHER): Admitting: Internal Medicine

## 2023-09-21 VITALS — BP 147/54 | HR 66 | Resp 18

## 2023-09-21 VITALS — BP 152/67 | HR 71 | Temp 97.6°F | Resp 20 | Ht 69.0 in | Wt 224.6 lb

## 2023-09-21 DIAGNOSIS — I2510 Atherosclerotic heart disease of native coronary artery without angina pectoris: Secondary | ICD-10-CM | POA: Insufficient documentation

## 2023-09-21 DIAGNOSIS — D509 Iron deficiency anemia, unspecified: Secondary | ICD-10-CM | POA: Insufficient documentation

## 2023-09-21 DIAGNOSIS — I4891 Unspecified atrial fibrillation: Secondary | ICD-10-CM | POA: Insufficient documentation

## 2023-09-21 DIAGNOSIS — Z803 Family history of malignant neoplasm of breast: Secondary | ICD-10-CM | POA: Insufficient documentation

## 2023-09-21 DIAGNOSIS — D649 Anemia, unspecified: Secondary | ICD-10-CM

## 2023-09-21 DIAGNOSIS — Z7901 Long term (current) use of anticoagulants: Secondary | ICD-10-CM | POA: Diagnosis not present

## 2023-09-21 LAB — BASIC METABOLIC PANEL WITH GFR
Anion gap: 8 (ref 5–15)
BUN: 23 mg/dL (ref 8–23)
CO2: 20 mmol/L — ABNORMAL LOW (ref 22–32)
Calcium: 9 mg/dL (ref 8.9–10.3)
Chloride: 110 mmol/L (ref 98–111)
Creatinine, Ser: 0.93 mg/dL (ref 0.44–1.00)
GFR, Estimated: >60 mL/min (ref >60)
Glucose, Bld: 138 mg/dL — ABNORMAL HIGH (ref 70–99)
Potassium: 3.6 mmol/L (ref 3.5–5.1)
Sodium: 138 mmol/L (ref 135–145)

## 2023-09-21 LAB — CBC WITH DIFFERENTIAL (CANCER CENTER ONLY)
Abs Immature Granulocytes: 0.01 K/uL (ref 0.00–0.07)
Basophils Absolute: 0 K/uL (ref 0.0–0.1)
Basophils Relative: 0 %
Eosinophils Absolute: 0.1 K/uL (ref 0.0–0.5)
Eosinophils Relative: 2 %
HCT: 34.4 % — ABNORMAL LOW (ref 36.0–46.0)
Hemoglobin: 11.3 g/dL — ABNORMAL LOW (ref 12.0–15.0)
Immature Granulocytes: 0 %
Lymphocytes Relative: 34 %
Lymphs Abs: 1.7 K/uL (ref 0.7–4.0)
MCH: 28.6 pg (ref 26.0–34.0)
MCHC: 32.8 g/dL (ref 30.0–36.0)
MCV: 87.1 fL (ref 80.0–100.0)
Monocytes Absolute: 0.4 K/uL (ref 0.1–1.0)
Monocytes Relative: 8 %
Neutro Abs: 2.6 K/uL (ref 1.7–7.7)
Neutrophils Relative %: 56 %
Platelet Count: 187 K/uL (ref 150–400)
RBC: 3.95 MIL/uL (ref 3.87–5.11)
RDW: 17.9 % — ABNORMAL HIGH (ref 11.5–15.5)
WBC Count: 4.8 K/uL (ref 4.0–10.5)
nRBC: 0 % (ref 0.0–0.2)

## 2023-09-21 MED ORDER — IRON SUCROSE 20 MG/ML IV SOLN
200.0000 mg | Freq: Once | INTRAVENOUS | Status: AC
  Administered 2023-09-21: 200 mg via INTRAVENOUS
  Filled 2023-09-21: qty 10

## 2023-09-21 NOTE — Assessment & Plan Note (Addendum)
#   Anemia- Hb-symptomatic June 2025- Hb 8/ER].  Severe iron  deficiency- Lack of improvement on oral iron /Iron  sulfate [GI].    # S/p  IV iron  infusion/Venofer - improved; proceed with venofer .  #Recommend gentle iron  [iron  biglycinate; 28 mg ] 1 pill a day.  This pill is unlikely to cause stomach upset or cause constipation.  Available Over the counter or talk to pharmacist.   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 HOWEVER Currently on Eliquis  only.  However patient declines any evaluation with GI [ never colonoscopy because of preference].  # CAD/ A.fib [Dr.Khan/Dr.Mclean]- on Eliquis -  currently OFF plavix /asprin. NO CKD- solitary [Dr.Lateef/Drs.Sninksi]  # DISPOSITION: # venofer  today # follow up 4 months-  MD; labs- cbc/bmp; iron  studies; ferritin- possible venofer - Dr.B

## 2023-09-21 NOTE — Progress Notes (Signed)
 [facility] CONSULT NOTE  Patient Care Team: [name], NP as PCP - General (Nurse Practitioner) [name], MD as Consulting Physician (Pulmonary Disease) [name], MD as Consulting Physician (Cardiology) [name], MD as Referring Physician (Vascular Surgery) [name], MD as Consulting Physician (Oncology)  CHIEF COMPLAINTS/PURPOSE OF CONSULTATION: anemia  Oncology History   No history exists.     HISTORY OF PRESENTING ILLNESS: ,Patient ambulating-independently.   Alone/   Audrey Wolf Audrey Wolf 73 y.o.  female pleasant patient with a history of CAD/A-fib on Eliquis  -iron  deficiency anemia likely secondary chronic GI bleed is here for a follow up..  Patient status post iron  infusions noted to have improvement of her energy levels.  No nausea no vomiting.  Denies any blood in stools.  She is currently off aspirin  Plavix .  Continues to be on Eliquis .  Review of Systems  Constitutional:  Negative for chills, diaphoresis, fever, malaise/fatigue and weight loss.  HENT:  Negative for nosebleeds and sore throat.   Eyes:  Negative for double vision.  Respiratory:  Negative for cough, hemoptysis, sputum production, shortness of breath and wheezing.   Cardiovascular:  Negative for chest pain, palpitations, orthopnea and leg swelling.  Gastrointestinal:  Negative for abdominal pain, blood in stool, constipation, diarrhea, heartburn, melena, nausea and vomiting.  Genitourinary:  Negative for dysuria, frequency and urgency.  Musculoskeletal:  Negative for back pain and joint pain.  Skin: Negative.  Negative for itching and rash.  Neurological:  Negative for dizziness, tingling, focal weakness, weakness and headaches.  Endo/Heme/Allergies:  Does not bruise/bleed easily.  Psychiatric/Behavioral:  Negative for depression. The patient is not nervous/anxious and does not have insomnia.     MEDICAL HISTORY:  Past Medical History:  Diagnosis Date    Arthritis    Asthma    CHF (congestive heart failure) (HCC)    Chronic kidney disease    Coronary artery disease    GERD (gastroesophageal reflux disease)    Headache    Hypertension    Nephrolithiasis    NSTEMI (non-ST elevated myocardial infarction) (HCC)    Postprandial RUQ pain 05/25/2019    SURGICAL HISTORY: Past Surgical History:  Procedure Laterality Date   ABDOMINAL HYSTERECTOMY     APPENDECTOMY     BREAST BIOPSY Left 12/31/2010   neg   CORONARY ARTERY BYPASS GRAFT N/A 05/22/2017   Procedure: CORONARY ARTERY BYPASS GRAFTING (CABG) x 2 WITH ENDOSCOPIC HARVESTING OF RIGHT SAPHENOUS VEIN;  Surgeon: Army Dallas NOVAK, MD;  Location: [facility] OR;  Service: Open Heart Surgery;  Laterality: N/A;   LEFT HEART CATH AND CORONARY ANGIOGRAPHY N/A 05/18/2017   Procedure: LEFT HEART CATH AND CORONARY ANGIOGRAPHY;  Surgeon: [name], MD;  Location: [facility] INVASIVE CV LAB;  Service: Cardiovascular;  Laterality: N/A;   LITHOTRIPSY     RENAL ANGIOGRAPHY N/A 07/31/2022   Procedure: RENAL ANGIOGRAPHY;  Surgeon: [name], MD;  Location: [facility] INVASIVE CV LAB;  Service: Cardiovascular;  Laterality: N/A;   RENAL ANGIOGRAPHY N/A 10/13/2022   Procedure: RENAL ANGIOGRAPHY;  Surgeon: [name], MD;  Location: [facility] INVASIVE CV LAB;  Service: Cardiovascular;  Laterality: N/A;   TEE WITHOUT CARDIOVERSION N/A 05/22/2017   Procedure: TRANSESOPHAGEAL ECHOCARDIOGRAM (TEE);  Surgeon: Army Dallas NOVAK, MD;  Location: [facility] OR;  Service: Open Heart Surgery;  Laterality: N/A;   TONSILLECTOMY      SOCIAL HISTORY: Social History   Socioeconomic History   Marital status: Single    Spouse name: Not on file  Number of children: Not on file   Years of education: Not on file   Highest education level: 12th grade  Occupational History    Employer: [employer]  Tobacco Use   Smoking status: Never   Smokeless tobacco: Never  Vaping Use   Vaping status: Never Used  Substance and Sexual Activity   Alcohol use:  No   Drug use: No   Sexual activity: Not Currently    Partners: Male    Birth control/protection: Post-menopausal  Other Topics Concern   Not on file  Social History Narrative   Divorced    Social Drivers of Health   Financial Resource Strain: Low Risk  (10/06/2022)   Overall Financial Resource Strain (CARDIA)    Difficulty of Paying Living Expenses: Not very hard  Food Insecurity: No Food Insecurity (10/20/2022)   Hunger Vital Sign    Worried About Running Out of Food in the Last Year: Never true    Ran Out of Food in the Last Year: Never true  Transportation Needs: No Transportation Needs (01/19/2023)   PRAPARE - Administrator, Civil Service (Medical): No    Lack of Transportation (Non-Medical): No  Physical Activity: Unknown (01/19/2023)   Exercise Vital Sign    Days of Exercise per Week: Patient declined    Minutes of Exercise per Session: 30 min  Stress: Stress Concern Present (01/19/2023)   [name]    Feeling of Stress : To some extent  Social Connections: Unknown (01/19/2023)   Social Connection and Isolation Panel    Frequency of Communication with Friends and Family: Once a week    Frequency of Social Gatherings with Friends and Family: Patient declined    Attends Religious Services: Never    Database administrator or Organizations: No    Attends Banker Meetings: Never    Marital Status: Divorced  Catering manager Violence: Not At Risk (10/07/2022)   Humiliation, Afraid, Rape, and Kick questionnaire    Fear of Current or Ex-Partner: No    Emotionally Abused: No    Physically Abused: No    Sexually Abused: No    FAMILY HISTORY: Family History  Problem Relation Age of Onset   Arthritis Mother    Arthritis Father    Heart disease Father    Stroke Father    Sudden Cardiac Death Father    Stroke Son    Breast cancer Maternal Aunt     ALLERGIES:  is allergic to  lisinopril, ace inhibitors, and baclofen .  MEDICATIONS:  Current Outpatient Medications  Medication Sig Dispense Refill   acetaminophen  (TYLENOL ) 500 MG tablet Take 1 tablet (500 mg total) by mouth daily as needed for mild pain or headache. 30 tablet 0   albuterol  (VENTOLIN  HFA) 108 (90 Base) MCG/ACT inhaler SMARTSIG:2 Puff(s) By Mouth Every 4-6 Hours PRN     carvedilol  (COREG ) 25 MG tablet TAKE 1 TABLET BY MOUTH TWICE DAILY WITH MEALS 180 tablet 0   dapagliflozin  propanediol (FARXIGA ) 10 MG TABS tablet Take 1 tablet (10 mg total) by mouth daily before breakfast. 30 tablet 3   diltiazem  (CARDIZEM  CD) 180 MG 24 hr capsule Take 1 capsule by mouth once daily 90 capsule 0   ELIQUIS  5 MG TABS tablet Take 1 tablet by mouth twice daily 60 tablet 0   famotidine  (PEPCID ) 20 MG tablet Take 20 mg by mouth 2 (two) times daily.     ferrous sulfate  325 (65  FE) MG EC tablet Take 325 mg by mouth daily with breakfast.     furosemide  (LASIX ) 20 MG tablet USE dialy only when SOB 30 tablet 11   hydrALAZINE  (APRESOLINE ) 100 MG tablet Take 1 tablet (100 mg total) by mouth 3 (three) times daily. 90 tablet 11   isosorbide  mononitrate (IMDUR ) 30 MG 24 hr tablet Take 1 tablet (30 mg total) by mouth daily. 30 tablet 1   loratadine  (CLARITIN ) 10 MG tablet Take 10 mg by mouth daily.      losartan  (COZAAR ) 100 MG tablet Take 1 tablet (100 mg total) by mouth daily. 30 tablet 11   Multiple Vitamins-Minerals (PRESERVISION AREDS 2) CAPS Take 2 capsules by mouth daily.     NEOMYCIN -POLYMYXIN-HYDROCORTISONE (CORTISPORIN) 1 % SOLN OTIC solution 4 gtt in affected ear(s) tid, max of 10 days. Lie with affected ear upward x 5 minutes 10 mL 0   rosuvastatin  (CRESTOR ) 20 MG tablet Take 1 tablet by mouth once daily 90 tablet 3   SYMBICORT  80-4.5 MCG/ACT inhaler Inhale 2 puffs by mouth twice daily 11 g 0   triamcinolone  (NASACORT  ALLERGY 24HR) 55 MCG/ACT AERO nasal inhaler Place 2 sprays in each nostril daily 10.8 mL 2   vitamin C  (ASCORBIC ACID) 250 MG tablet Take 250 mg by mouth daily.     Vitamin D , Ergocalciferol , (DRISDOL) 1.25 MG (50000 UNIT) CAPS capsule Take 50,000 Units by mouth every 7 (seven) days.     No current facility-administered medications for this visit.    PHYSICAL EXAMINATION:  Vitals:   09/21/23 1022 09/21/23 1058  BP: (!) 170/66 (!) 152/67  Pulse: 71   Resp: 20   Temp: 97.6 F (36.4 C)   SpO2: 100%    Filed Weights   09/21/23 1022  Weight: 224 lb 9.6 oz (101.9 kg)    Physical Exam Vitals and nursing note reviewed.  HENT:     Head: Normocephalic and atraumatic.     Mouth/Throat:     Pharynx: Oropharynx is clear.  Eyes:     Extraocular Movements: Extraocular movements intact.     Pupils: Pupils are equal, round, and reactive to light.  Cardiovascular:     Rate and Rhythm: Normal rate and regular rhythm.  Pulmonary:     Comments: Decreased breath sounds bilaterally.  Abdominal:     Palpations: Abdomen is soft.  Musculoskeletal:        General: Normal range of motion.     Cervical back: Normal range of motion.  Skin:    General: Skin is warm.  Neurological:     General: No focal deficit present.     Mental Status: She is alert and oriented to person, place, and time.  Psychiatric:        Behavior: Behavior normal.        Judgment: Judgment normal.     LABORATORY DATA:  I have reviewed the data as listed Lab Results  Component Value Date   WBC 4.8 09/21/2023   HGB 11.3 (L) 09/21/2023   HCT 34.4 (L) 09/21/2023   MCV 87.1 09/21/2023   PLT 187 09/21/2023   Recent Labs    12/30/22 0152 01/11/23 1117 07/07/23 1104 07/10/23 1341 07/17/23 1019 09/21/23 1019  NA 140   < > 141 141 140 138  K 3.9   < > 3.8 3.8 3.9 3.6  CL 112*   < > 110 113* 111 110  CO2 23   < > 24 22 20* 20*  GLUCOSE 107*   < >  91 94 135* 138*  BUN 34*   < > 23 24* 22 23  CREATININE 1.01*   < > 0.99 0.95 0.99 0.93  CALCIUM  8.5*   < > 8.9 8.6* 8.6* 9.0  GFRNONAA 59*   < >  --  >60 >60 >60   PROT 6.3*  --  6.3  --   --   --   ALBUMIN  3.8  --  3.9  --   --   --   AST 16  --  14  --   --   --   ALT 15  --  11  --   --   --   ALKPHOS 63  --  71  --   --   --   BILITOT 0.5  --  0.3  --   --   --    < > = values in this interval not displayed.    RADIOGRAPHIC STUDIES: I have personally reviewed the radiological images as listed and agreed with the findings in the report. No results found.   Symptomatic anemia # Anemia- Hb-symptomatic June 2025- Hb 8/ER].  Severe iron  deficiency- Lack of improvement on oral iron /Iron  sulfate [GI].    # S/p  IV iron  infusion/Venofer - improved; proceed with venofer .  #Recommend gentle iron  [iron  biglycinate; 28 mg ] 1 pill a day.  This pill is unlikely to cause stomach upset or cause constipation.  Available Over the counter or talk to pharmacist.   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 HOWEVER Currently on Eliquis  only.  However patient declines any evaluation with GI [ never colonoscopy because of preference].  # CAD/ A.fib [Dr.Khan/Dr.Mclean]- on Eliquis -  currently OFF plavix /asprin. NO CKD- solitary [Dr.Lateef/Drs.Sninksi]  # DISPOSITION: # venofer  today # follow up 4 months-  MD; labs- cbc/bmp; iron  studies; ferritin- possible venofer - Dr.B    Above plan of care was discussed with patient/family in detail.  My contact information was given to the patient/family.       Cindy JONELLE Joe, MD 09/21/2023 11:20 AM

## 2023-09-21 NOTE — Patient Instructions (Signed)

## 2023-09-21 NOTE — Patient Instructions (Signed)
#  Recommend gentle iron  [iron  biglycinate; 28 mg ] 1 pill a day.  This pill is unlikely to cause stomach upset or cause constipation.  Available Over the counter or talk to pharmacist.

## 2023-09-21 NOTE — Progress Notes (Signed)
 Patient has no concerns

## 2023-10-09 ENCOUNTER — Encounter: Payer: Self-pay | Admitting: Nurse Practitioner

## 2023-10-09 ENCOUNTER — Ambulatory Visit (INDEPENDENT_AMBULATORY_CARE_PROVIDER_SITE_OTHER): Admitting: Nurse Practitioner

## 2023-10-09 VITALS — BP 200/90 | HR 68 | Temp 98.0°F | Resp 20 | Ht 69.0 in | Wt 227.5 lb

## 2023-10-09 DIAGNOSIS — I480 Paroxysmal atrial fibrillation: Secondary | ICD-10-CM

## 2023-10-09 DIAGNOSIS — R7303 Prediabetes: Secondary | ICD-10-CM

## 2023-10-09 DIAGNOSIS — E782 Mixed hyperlipidemia: Secondary | ICD-10-CM | POA: Diagnosis not present

## 2023-10-09 DIAGNOSIS — I10 Essential (primary) hypertension: Secondary | ICD-10-CM

## 2023-10-09 DIAGNOSIS — D649 Anemia, unspecified: Secondary | ICD-10-CM

## 2023-10-09 NOTE — Progress Notes (Signed)
 Audrey Glance, NP-C Phone: [phone]  Audrey Wolf is a 73 y.o. female who presents today for follow up.   Discussed the use of AI scribe software for clinical note transcription with the patient, who gave verbal consent to proceed.  History of Present Illness   Audrey Wolf is a 73 year old female with anemia and hypertension who presents for follow-up of blood pressure management and anemia treatment.  She has been experiencing low blood counts and is receiving iron  infusions for anemia. She has been told by her doctors that a chronic gastrointestinal bleed may be the cause. No blood in stool or hemoptysis. Her stool is dark, attributed to oral iron  supplements. Hemoglobin improved from 8 to 10.5 after recent infusions, with another infusion scheduled next month.  She has a history of atrial fibrillation and is currently on Eliquis . Previously on aspirin  and Plavix , which were discontinued due to concerns about gastrointestinal bleeding. Current medications for heart conditions include Lasix , isosorbide , Farxiga , diltiazem , carvedilol , hydralazine , and losartan . No chest pain, dizziness, or swelling, but blood pressure has been elevated recently, with readings as high as 170/86 today and 219/92 on a previous day. She monitors her blood pressure at home and notes it often rises in the evenings, particularly after work.  Her diet is high in salt, which she acknowledges as a problem. She has attempted to reduce salt intake but continues to consume it. She eats out frequently due to living alone and not cooking at home, often choosing chicken-based meals. She does not eat vegetables or fruits, a preference she has maintained since childhood. She works at [employer].  She experienced a headache recently, which she associates with her elevated blood pressure. No vision changes. She keeps a log of her blood pressure readings.      Social History   Tobacco Use  Smoking Status Never   Smokeless Tobacco Never    Current Outpatient Medications on File Prior to Visit  Medication Sig Dispense Refill   acetaminophen  (TYLENOL ) 500 MG tablet Take 1 tablet (500 mg total) by mouth daily as needed for mild pain or headache. 30 tablet 0   albuterol  (VENTOLIN  HFA) 108 (90 Base) MCG/ACT inhaler SMARTSIG:2 Puff(s) By Mouth Every 4-6 Hours PRN     carvedilol  (COREG ) 25 MG tablet TAKE 1 TABLET BY MOUTH TWICE DAILY WITH MEALS 180 tablet 0   dapagliflozin  propanediol (FARXIGA ) 10 MG TABS tablet Take 1 tablet (10 mg total) by mouth daily before breakfast. 30 tablet 3   diltiazem  (CARDIZEM  CD) 180 MG 24 hr capsule Take 1 capsule by mouth once daily 90 capsule 0   ELIQUIS  5 MG TABS tablet Take 1 tablet by mouth twice daily 60 tablet 0   famotidine  (PEPCID ) 20 MG tablet Take 20 mg by mouth 2 (two) times daily.     ferrous sulfate  325 (65 FE) MG EC tablet Take 325 mg by mouth daily with breakfast.     furosemide  (LASIX ) 20 MG tablet USE dialy only when SOB 30 tablet 11   hydrALAZINE  (APRESOLINE ) 100 MG tablet Take 1 tablet (100 mg total) by mouth 3 (three) times daily. 90 tablet 11   isosorbide  mononitrate (IMDUR ) 30 MG 24 hr tablet Take 1 tablet (30 mg total) by mouth daily. 30 tablet 1   loratadine  (CLARITIN ) 10 MG tablet Take 10 mg by mouth daily.      losartan  (COZAAR ) 100 MG tablet Take 1 tablet (100 mg total) by mouth daily. 30 tablet 11  Multiple Vitamins-Minerals (PRESERVISION AREDS 2) CAPS Take 2 capsules by mouth daily.     NEOMYCIN -POLYMYXIN-HYDROCORTISONE (CORTISPORIN) 1 % SOLN OTIC solution 4 gtt in affected ear(s) tid, max of 10 days. Lie with affected ear upward x 5 minutes 10 mL 0   rosuvastatin  (CRESTOR ) 20 MG tablet Take 1 tablet by mouth once daily 90 tablet 3   SYMBICORT  80-4.5 MCG/ACT inhaler Inhale 2 puffs by mouth twice daily 11 g 0   triamcinolone  (NASACORT  ALLERGY 24HR) 55 MCG/ACT AERO nasal inhaler Place 2 sprays in each nostril daily 10.8 mL 2   vitamin C (ASCORBIC  ACID) 250 MG tablet Take 250 mg by mouth daily.     Vitamin D , Ergocalciferol , (DRISDOL) 1.25 MG (50000 UNIT) CAPS capsule Take 50,000 Units by mouth every 7 (seven) days.     No current facility-administered medications on file prior to visit.     ROS see history of present illness  Objective  Physical Exam Vitals:   10/09/23 0804 10/09/23 0829  BP: (!) 170/86 (!) 200/90  Pulse: 68   Resp: 20   Temp: 98 F (36.7 C)   SpO2: 98%     BP Readings from Last 3 Encounters:  10/19/23 (!) 165/73  10/13/23 (!) 202/85  10/09/23 (!) 200/90   Wt Readings from Last 3 Encounters:  10/19/23 229 lb (103.9 kg)  10/13/23 220 lb (99.8 kg)  10/09/23 227 lb 8 oz (103.2 kg)    Physical Exam Constitutional:      General: She is not in acute distress.    Appearance: Normal appearance.  HENT:     Head: Normocephalic.  Cardiovascular:     Rate and Rhythm: Normal rate and regular rhythm.     Heart sounds: Normal heart sounds.  Pulmonary:     Effort: Pulmonary effort is normal.     Breath sounds: Normal breath sounds.  Skin:    General: Skin is warm and dry.  Neurological:     General: No focal deficit present.     Mental Status: She is alert.  Psychiatric:        Mood and Affect: Mood normal.        Behavior: Behavior normal.      Assessment/Plan: Please see individual problem list.  Primary hypertension Assessment & Plan: Uncontrolled hypertension is present with recent elevated readings, including 170/86 today, and variability up to 219/92. She is on multiple antihypertensives. High salt intake and renal artery stenosis contribute, and she has difficulty adhering to a low-salt diet. Continue current medication regimen. Monitor blood pressure at home, especially 1-2 hours after taking medication. Encourage dietary modifications to reduce salt intake. Follow up with cardiology. Declined referral to [facility] due to location.   Paroxysmal atrial fibrillation  (HCC) Assessment & Plan: Continue current medication regimen. Follow up with Cardiology.    Symptomatic anemia Assessment & Plan: Iron  deficiency anemia is likely due to chronic gastrointestinal blood loss and is managed with iron  infusions. Hemoglobin has improved from 8 to 10.5. There are no overt signs of GI bleeding. Declines evaluation by GI. She continues Eliquis  for atrial fibrillation. Continue iron  infusions as scheduled and follow up with hematology next month.   Mixed hyperlipidemia Assessment & Plan: Managed with Crestor  20 mg daily. Continue.   Prediabetes Assessment & Plan: Encourage healthy diet.       Return in about 3 months (around 01/08/2024) for Follow up.   Audrey Glance, NP-C [facility]

## 2023-10-13 ENCOUNTER — Telehealth: Payer: Self-pay | Admitting: *Deleted

## 2023-10-13 ENCOUNTER — Ambulatory Visit (INDEPENDENT_AMBULATORY_CARE_PROVIDER_SITE_OTHER): Payer: BC Managed Care – PPO | Admitting: *Deleted

## 2023-10-13 VITALS — BP 202/85 | Ht 69.0 in | Wt 220.0 lb

## 2023-10-13 DIAGNOSIS — Z0000 Encounter for general adult medical examination without abnormal findings: Secondary | ICD-10-CM

## 2023-10-13 NOTE — Patient Instructions (Signed)
 Audrey Wolf,  Thank you for taking the time for your Medicare Wellness Visit. I appreciate your continued commitment to your health goals. Please review the care plan we discussed, and feel free to reach out if I can assist you further.  Medicare recommends these wellness visits once per year to help you and your care team stay ahead of potential health issues. These visits are designed to focus on prevention, allowing your provider to concentrate on managing your acute and chronic conditions during your regular appointments.  Please note that Annual Wellness Visits do not include a physical exam. Some assessments may be limited, especially if the visit was conducted virtually. If needed, we may recommend a separate in-person follow-up with your provider.  Ongoing Care Seeing your primary care provider every 3 to 6 months helps us  monitor your health and provide consistent, personalized care.  Remember to update your flu, pneumonia and tetanus (tdap) vaccines. Consider getting your mammogram and colonoscopy.   Referrals If a referral was made during today's visit and you haven't received any updates within two weeks, please contact the referred provider directly to check on the status.  Recommended Screenings:  Health Maintenance  Topic Date Due   DTaP/Tdap/Td vaccine (1 - Tdap) Never done   Pneumococcal Vaccine for age over 39 (2 of 2 - PCV) 10/15/2019   Mammogram  12/24/2019   Colon Cancer Screening  02/03/2022   Flu Shot  01/27/2024*   Medicare Annual Wellness Visit  10/12/2024   DEXA scan (bone density measurement)  Completed   Hepatitis C Screening  Completed   HPV Vaccine  Aged Out   Meningitis B Vaccine  Aged Out   COVID-19 Vaccine  Discontinued   Zoster (Shingles) Vaccine  Discontinued  *Topic was postponed. The date shown is not the original due date.       10/13/2023    9:09 AM  Advanced Directives  Does Patient Have a Medical Advance Directive? Yes  Type of Sports coach of [place];Living will  Copy of Healthcare Power of Attorney in Chart? No - copy requested   Advance Care Planning is important because it: Ensures you receive medical care that aligns with your values, goals, and preferences. Provides guidance to your family and loved ones, reducing the emotional burden of decision-making during critical moments.  Vision: Annual vision screenings are recommended for early detection of glaucoma, cataracts, and diabetic retinopathy. These exams can also reveal signs of chronic conditions such as diabetes and high blood pressure.  Dental: Annual dental screenings help detect early signs of oral cancer, gum disease, and other conditions linked to overall health, including heart disease and diabetes.  Please see the attached documents for additional preventive care recommendations.

## 2023-10-13 NOTE — Telephone Encounter (Signed)
 Performed AWV Patient stated that her blood pressure this morning when she got up was 202/85 and yesterday it was 155/64. Patient stated that she gets up real early and does not take her blood pressure medication until she eats lunch. Patient stated that her blood pressure has been fluctuating a lot in the past two weeks. Patient stated that she is at work at [employer] and has no way of checking her blood pressure now. Patient stated the pharmacy there opens at 10:00 and agreed to have them check her blood pressure when they open. Patient denies any symptoms stating that she feels better today than she has in a while. Patient agreed to call her heart doctor today and let him know how her blood pressure has been running.

## 2023-10-13 NOTE — Progress Notes (Signed)
 Subjective:   Audrey Wolf is a 73 y.o. who presents for a Medicare Wellness preventive visit.  As a reminder, Annual Wellness Visits don't include a physical exam, and some assessments may be limited, especially if this visit is performed virtually. We may recommend an in-person follow-up visit with your provider if needed.  Visit Complete: Virtual I connected with  Audrey Wolf on 10/13/23 by a audio enabled telemedicine application and verified that I am speaking with the correct person using two identifiers.  Patient Location: Home  Provider Location: Home Office  I discussed the limitations of evaluation and management by telemedicine. The patient expressed understanding and agreed to proceed.  Vital Signs: Because this visit was a virtual/telehealth visit, some criteria may be missing or patient reported. Any vitals not documented were not able to be obtained and vitals that have been documented are patient reported.  VideoDeclined- This patient declined Librarian, academic. Therefore the visit was completed with audio only.  Persons Participating in Visit: Patient.  AWV Questionnaire: No: Patient Medicare AWV questionnaire was not completed prior to this visit.  Cardiac Risk Factors include: advanced age (>13men, >29 women);obesity (BMI >30kg/m2);hypertension;dyslipidemia, Risk factor comments: CAD, P Afib, S/P CABG X 2     Objective:    Today's Vitals   10/13/23 0852  BP: (!) 202/85  Weight: 220 lb (99.8 kg)  Height: 5' 9 (1.753 m)   Body mass index is 32.49 kg/m.     10/13/2023    9:09 AM 09/21/2023   10:35 AM 09/21/2023   10:22 AM 07/10/2023    1:39 PM 01/11/2023   11:17 AM 12/30/2022    1:39 AM 10/13/2022    7:01 AM  Advanced Directives  Does Patient Have a Medical Advance Directive? Yes Yes Yes Yes No No Yes  Type of Estate agent of [place];Living will Healthcare Power of [place];Living will  Healthcare Power of [place];Living will Healthcare Power of [place];Living will   Healthcare Power of [place];Living will  Does patient want to make changes to medical advance directive?  Yes (ED - Information included in AVS)     Yes (ED - send information to MyChart)  Copy of Healthcare Power of Attorney in Chart? No - copy requested  No - copy requested      Would patient like information on creating a medical advance directive?  Yes (ED - Information included in AVS)    No - Patient declined     Current Medications (verified) Outpatient Encounter Medications as of 10/13/2023  Medication Sig   acetaminophen  (TYLENOL ) 500 MG tablet Take 1 tablet (500 mg total) by mouth daily as needed for mild pain or headache.   albuterol  (VENTOLIN  HFA) 108 (90 Base) MCG/ACT inhaler SMARTSIG:2 Puff(s) By Mouth Every 4-6 Hours PRN   carvedilol  (COREG ) 25 MG tablet TAKE 1 TABLET BY MOUTH TWICE DAILY WITH MEALS   dapagliflozin  propanediol (FARXIGA ) 10 MG TABS tablet Take 1 tablet (10 mg total) by mouth daily before breakfast.   diltiazem  (CARDIZEM  CD) 180 MG 24 hr capsule Take 1 capsule by mouth once daily   ELIQUIS  5 MG TABS tablet Take 1 tablet by mouth twice daily   famotidine  (PEPCID ) 20 MG tablet Take 20 mg by mouth 2 (two) times daily.   ferrous sulfate  325 (65 FE) MG EC tablet Take 325 mg by mouth daily with breakfast.   furosemide  (LASIX ) 20 MG tablet USE dialy only when SOB   hydrALAZINE  (APRESOLINE ) 100 MG  tablet Take 1 tablet (100 mg total) by mouth 3 (three) times daily.   isosorbide  mononitrate (IMDUR ) 30 MG 24 hr tablet Take 1 tablet (30 mg total) by mouth daily.   loratadine  (CLARITIN ) 10 MG tablet Take 10 mg by mouth daily.    losartan  (COZAAR ) 100 MG tablet Take 1 tablet (100 mg total) by mouth daily.   Multiple Vitamins-Minerals (PRESERVISION AREDS 2) CAPS Take 2 capsules by mouth daily.   NEOMYCIN -POLYMYXIN-HYDROCORTISONE (CORTISPORIN) 1 % SOLN OTIC solution 4 gtt in affected ear(s) tid,  max of 10 days. Lie with affected ear upward x 5 minutes   rosuvastatin  (CRESTOR ) 20 MG tablet Take 1 tablet by mouth once daily   SYMBICORT  80-4.5 MCG/ACT inhaler Inhale 2 puffs by mouth twice daily   triamcinolone  (NASACORT  ALLERGY 24HR) 55 MCG/ACT AERO nasal inhaler Place 2 sprays in each nostril daily   vitamin C (ASCORBIC ACID) 250 MG tablet Take 250 mg by mouth daily.   Vitamin D , Ergocalciferol , (DRISDOL) 1.25 MG (50000 UNIT) CAPS capsule Take 50,000 Units by mouth every 7 (seven) days.   No facility-administered encounter medications on file as of 10/13/2023.    Allergies (verified) Lisinopril, Ace inhibitors, and Baclofen    History: Past Medical History:  Diagnosis Date   Arthritis    Asthma    CHF (congestive heart failure) (HCC)    Chronic kidney disease    Coronary artery disease    GERD (gastroesophageal reflux disease)    Headache    Hypertension    Nephrolithiasis    NSTEMI (non-ST elevated myocardial infarction) (HCC)    Postprandial RUQ pain 05/25/2019   Past Surgical History:  Procedure Laterality Date   ABDOMINAL HYSTERECTOMY     APPENDECTOMY     BREAST BIOPSY Left 12/31/2010   neg   CORONARY ARTERY BYPASS GRAFT N/A 05/22/2017   Procedure: CORONARY ARTERY BYPASS GRAFTING (CABG) x 2 WITH ENDOSCOPIC HARVESTING OF RIGHT SAPHENOUS VEIN;  Surgeon: Army Dallas NOVAK, MD;  Location: [facility] OR;  Service: Open Heart Surgery;  Laterality: N/A;   LEFT HEART CATH AND CORONARY ANGIOGRAPHY N/A 05/18/2017   Procedure: LEFT HEART CATH AND CORONARY ANGIOGRAPHY;  Surgeon: [name], MD;  Location: [facility] INVASIVE CV LAB;  Service: Cardiovascular;  Laterality: N/A;   LITHOTRIPSY     RENAL ANGIOGRAPHY N/A 07/31/2022   Procedure: RENAL ANGIOGRAPHY;  Surgeon: [name], MD;  Location: [facility] INV[facility]IVE CV LAB;  Service: Cardiovascular;  Laterality: N/A;   RENAL ANGIOGRAPHY N/A 10/13/2022   Procedure: RENAL ANGIOGRAPHY;  Surgeon: [name], MD;  Location: [facility] INV[facility] CV LAB;   Service: Cardiovascular;  Laterality: N/A;   TEE WITHOUT CARDIOVERSION N/A 05/22/2017   Procedure: TRANSESOPHAGEAL ECHOCARDIOGRAM (TEE);  Surgeon: Army Dallas NOVAK, MD;  Location[facility] OR;  Service: Open Heart Surgery;  Laterality: N/A;   TONSILLECTOMY     Family History  Problem Relation Age of Onset   Arthritis Mother    Arthritis Father    Heart disease Father    Stroke Father    Sudden Cardiac Death Father    Stroke Son    Breast cancer Maternal Aunt    Social History   Socioeconomic History   Marital status: Single    Spouse name: Not on file   Number of children: Not on file   Years of education: Not on file   Highest education level: 12th grade  Occupational History    Employer: [employer]  Tobacco Use   Smoking status: Never   Smokeless  tobacco: Never  Vaping Use   Vaping status: Never Used  Substance and Sexual Activity   Alcohol use: No   Drug use: No   Sexual activity: Not Currently    Partners: Male    Birth control/protection: Post-menopausal  Other Topics Concern   Not on file  Social History Narrative   Divorced    Social Drivers of Health   Financial Resource Strain: Low Risk  (10/13/2023)   Overall Financial Resource Strain (CARDIA)    Difficulty of Paying Living Expenses: Not hard at all  Food Insecurity: No Food Insecurity (10/13/2023)   Hunger Vital Sign    Worried About Running Out of Food in the Last Year: Never true    Ran Out of Food in the Last Year: Never true  Transportation Needs: No Transportation Needs (10/13/2023)   PRAPARE - Administrator, Civil Service (Medical): No    Lack of Transportation (Non-Medical): No  Physical Activity: Inactive (10/13/2023)   Exercise Vital Sign    Days of Exercise per Week: 0 days    Minutes of Exercise per Session: 0 min  Stress: Stress Concern Present (10/13/2023)   [name]    Feeling of Stress: Rather much  Social Connections:  Moderately Isolated (10/13/2023)   Social Connection and Isolation Panel    Frequency of Communication with Friends and Family: Twice a week    Frequency of Social Gatherings with Friends and Family: More than three times a week    Attends Religious Services: Never    Database administrator or Organizations: Yes    Attends Banker Meetings: Never    Marital Status: Divorced    Tobacco Counseling Counseling given: Not Answered    Clinical Intake:  Pre-visit preparation completed: Yes  Pain : No/denies pain     BMI - recorded: 32.49 Nutritional Status: BMI > 30  Obese Nutritional Risks: None Diabetes: No  Lab Results  Component Value Date   HGBA1C 6.0 07/07/2023   HGBA1C 5.4 05/09/2022   HGBA1C 5.7 01/11/2021     How often do you need to have someone help you when you read instructions, pamphlets, or other written materials from your doctor or pharmacy?: 1 - Never  Interpreter Needed?: No  Information entered by :: R. Debbera Wolken LPN   Activities of Daily Living     10/13/2023    8:59 AM  In your present state of health, do you have any difficulty performing the following activities:  Hearing? 0  Vision? 0  Difficulty concentrating or making decisions? 0  Walking or climbing stairs? 0  Dressing or bathing? 0  Doing errands, shopping? 0  Preparing Food and eating ? N  Using the Toilet? N  In the past six months, have you accidently leaked urine? N  Do you have problems with loss of bowel control? N  Managing your Medications? N  Managing your Finances? N  Housekeeping or managing your Housekeeping? N    Patient Care Team: [name], NP as PCP - General (Nurse Practitioner) [name], MD as Consulting Physician (Pulmonary Disease) [name], MD as Consulting Physician (Cardiology) [name], MD as Referring Physician (Vascular Surgery) [name], MD as Consulting Physician (Oncology)  I have updated your Care Teams any  recent Medical Services you may have received from other providers in the past year.     Assessment:   This is a routine wellness examination for  Audrey.  Hearing/Vision screen Hearing Screening - Comments:: No issues Vision Screening - Comments:: Glasses   Goals Addressed             This Visit's Progress    Patient Stated       Wants to improve her health and eat better       Depression Screen     10/13/2023    9:03 AM 10/09/2023    8:09 AM 08/03/2023    9:58 AM 06/12/2023    8:14 AM 06/05/2023   11:39 AM 01/23/2023    8:19 AM 10/24/2022    9:50 AM  PHQ 2/9 Scores  PHQ - 2 Score 0 0 0 0 0 0 0  PHQ- 9 Score 0 0  0 0 0 0    Fall Risk     10/13/2023    9:00 AM 06/12/2023    8:14 AM 06/05/2023   11:39 AM 01/23/2023    8:19 AM 10/24/2022    9:49 AM  Fall Risk   Falls in the past year? 0 0 0 0 0  Number falls in past yr: 0 0 0 0 0  Injury with Fall? 0 0 0 0 0  Risk for fall due to : No Fall Risks No Fall Risks No Fall Risks No Fall Risks No Fall Risks  Follow up Falls evaluation completed;Falls prevention discussed Falls evaluation completed Falls evaluation completed Falls evaluation completed Falls evaluation completed    MEDICARE RISK AT HOME:  Medicare Risk at Home Any stairs in or around the home?: No If so, are there any without handrails?: No Home free of loose throw rugs in walkways, pet beds, electrical cords, etc?: Yes Adequate lighting in your home to reduce risk of falls?: Yes Life alert?: No Use of a cane, walker or w/c?: No Grab bars in the bathroom?: Yes Shower chair or bench in shower?: No Elevated toilet seat or a handicapped toilet?: Yes  TIMED UP AND GO:  Was the test performed?  No  Cognitive Function: 6CIT completed        10/13/2023    9:09 AM 10/07/2022    9:06 AM 10/03/2021   10:02 AM  6CIT Screen  What Year? 0 points 0 points 0 points  What month? 0 points 0 points 0 points  What time? 0 points 0 points 0 points  Count back from 20 0  points 0 points 0 points  Months in reverse 0 points 0 points 0 points  Repeat phrase 2 points 0 points 0 points  Total Score 2 points 0 points 0 points    Immunizations Immunization History  Administered Date(s) Administered   Fluad Quad(high Dose 65+) 10/15/2018, 10/29/2021   Fluad Trivalent(High Dose 65+) 01/23/2023   INFLUENZA, HIGH DOSE SEASONAL PF 10/10/2016   Influenza Whole 11/10/2010   Influenza-Unspecified 10/06/2010, 11/08/2019   Pneumococcal Polysaccharide-23 10/15/2018   Pneumococcal-Unspecified 10/06/2007    Screening Tests Health Maintenance  Topic Date Due   DTaP/Tdap/Td (1 - Tdap) Never done   Pneumococcal Vaccine: 50+ Years (2 of 2 - PCV) 10/15/2019   MAMMOGRAM  12/24/2019   Colonoscopy  02/03/2022   Influenza Vaccine  01/27/2024 (Originally 09/04/2023)   Medicare Annual Wellness (AWV)  10/12/2024   DEXA SCAN  Completed   Hepatitis C Screening  Completed   HPV VACCINES  Aged Out   Meningococcal B Vaccine  Aged Out   COVID-19 Vaccine  Discontinued   Zoster Vaccines- Shingrix  Discontinued    Health Maintenance Items  Addressed: Discussed the need to update flu, pneumonia and tetanus (tdap) vaccines. Patient declines mammogram and colonoscopy at this time.   Additional Screening:  Vision Screening: Recommended annual ophthalmology exams for early detection of glaucoma and other disorders of the eye. Is the patient up to date with their annual eye exam?  Yes  Who is the provider or what is the name of the office in which the patient attends annual eye exams? [facility]  Dental Screening: Recommended annual dental exams for proper oral hygiene  Community Resource Referral / Chronic Care Management: CRR required this visit?  No   CCM required this visit?  No   Plan:    I have personally reviewed and noted the following in the patient's chart:   Medical and social history Use of alcohol, tobacco or illicit drugs  Current medications and  supplements including opioid prescriptions. Patient is not currently taking opioid prescriptions. Functional ability and status Nutritional status Physical activity Advanced directives List of other physicians Hospitalizations, surgeries, and ER visits in previous 12 months Vitals Screenings to include cognitive, depression, and falls Referrals and appointments  In addition, I have reviewed and discussed with patient certain preventive protocols, quality metrics, and best practice recommendations. A written personalized care plan for preventive services as well as general preventive health recommendations were provided to patient.   Angeline Fredericks, LPN   [id number]   After Visit Summary: (MyChart) Due to this being a telephonic visit, the after visit summary with patients personalized plan was offered to patient via MyChart   Notes: Nothing significant to report at this time.  Phone note sent to PCP

## 2023-10-14 ENCOUNTER — Other Ambulatory Visit (INDEPENDENT_AMBULATORY_CARE_PROVIDER_SITE_OTHER): Payer: Self-pay | Admitting: Nurse Practitioner

## 2023-10-14 DIAGNOSIS — I701 Atherosclerosis of renal artery: Secondary | ICD-10-CM

## 2023-10-19 ENCOUNTER — Ambulatory Visit (INDEPENDENT_AMBULATORY_CARE_PROVIDER_SITE_OTHER): Admitting: Nurse Practitioner

## 2023-10-19 ENCOUNTER — Ambulatory Visit (INDEPENDENT_AMBULATORY_CARE_PROVIDER_SITE_OTHER)

## 2023-10-19 ENCOUNTER — Encounter (INDEPENDENT_AMBULATORY_CARE_PROVIDER_SITE_OTHER): Payer: Self-pay | Admitting: Nurse Practitioner

## 2023-10-19 VITALS — BP 165/73 | HR 62 | Ht 69.0 in | Wt 229.0 lb

## 2023-10-19 DIAGNOSIS — R6889 Other general symptoms and signs: Secondary | ICD-10-CM

## 2023-10-19 DIAGNOSIS — I701 Atherosclerosis of renal artery: Secondary | ICD-10-CM | POA: Diagnosis not present

## 2023-10-19 DIAGNOSIS — E782 Mixed hyperlipidemia: Secondary | ICD-10-CM | POA: Diagnosis not present

## 2023-10-19 NOTE — Progress Notes (Signed)
 Subjective:    Patient ID: Audrey Wolf, female    DOB: 1950-12-03, 73 y.o.   MRN: [id number] Chief Complaint  Patient presents with   Follow-up    The patient returns to the office for followup and review of the noninvasive studies regarding renal vascular hypertension and renal artery stenosis.  She has been having some issues with her blood pressure.  It has been wildly ranging in variable with systolics in the 200s to the 140s.  The patient denies any major changes in their medications.  The patient denies headache or flushing.  No flank or unusual back pain.     There have been no significant changes to the patient's overall health care.   No recent shortening of the patient's walking distance or new symptoms consistent with claudication.  No history of rest pain symptoms. No new ulcers or wounds of the lower extremities have occurred.   The patient denies amaurosis fugax or recent TIA symptoms. There are no recent neurological changes noted. There is no history of DVT, PE or superficial thrombophlebitis. No recent episodes of angina or shortness of breath documented.    Duplex ultrasound of the right renal artery shows evidence of patent renal artery stent with 1-59% stenosis.  Left renal artery has no flow but this is also consistent with previous studies.     Review of Systems  HENT:  Positive for nosebleeds.   All other systems reviewed and are negative.      Objective:   Physical Exam Vitals reviewed.  HENT:     Head: Normocephalic.  Cardiovascular:     Rate and Rhythm: Normal rate.     Pulses: Normal pulses.  Pulmonary:     Effort: Pulmonary effort is normal.  Skin:    General: Skin is warm and dry.  Neurological:     Mental Status: She is alert and oriented to person, place, and time.  Psychiatric:        Mood and Affect: Mood normal.        Behavior: Behavior normal.        Thought Content: Thought content normal.        Judgment: Judgment normal.      BP (!) 165/73 (BP Location: Right Arm, Patient Position: Sitting, Cuff Size: Large) Comment: pt reports she has not had bp medications yet this morning  Pulse 62   Ht 5' 9 (1.753 m)   Wt 229 lb (103.9 kg)   BMI 33.82 kg/m   Past Medical History:  Diagnosis Date   Arthritis    Asthma    CHF (congestive heart failure) (HCC)    Chronic kidney disease    Coronary artery disease    GERD (gastroesophageal reflux disease)    Headache    Hypertension    Nephrolithiasis    NSTEMI (non-ST elevated myocardial infarction) (HCC)    Postprandial RUQ pain 05/25/2019    Social History   Socioeconomic History   Marital status: Single    Spouse name: Not on file   Number of children: Not on file   Years of education: Not on file   Highest education level: 12th grade  Occupational History    Employer: [employer]  Tobacco Use   Smoking status: Never   Smokeless tobacco: Never  Vaping Use   Vaping status: Never Used  Substance and Sexual Activity   Alcohol use: No   Drug use: No   Sexual activity: Not Currently    Partners: Male  Birth control/protection: Post-menopausal  Other Topics Concern   Not on file  Social History Narrative   Divorced    Social Drivers of Health   Financial Resource Strain: Low Risk  (10/13/2023)   Overall Financial Resource Strain (CARDIA)    Difficulty of Paying Living Expenses: Not hard at all  Food Insecurity: No Food Insecurity (10/13/2023)   Hunger Vital Sign    Worried About Running Out of Food in the Last Year: Never true    Ran Out of Food in the Last Year: Never true  Transportation Needs: No Transportation Needs (10/13/2023)   PRAPARE - Administrator, Civil Service (Medical): No    Lack of Transportation (Non-Medical): No  Physical Activity: Inactive (10/13/2023)   Exercise Vital Sign    Days of Exercise per Week: 0 days    Minutes of Exercise per Session: 0 min  Stress: Stress Concern Present (10/13/2023)   [employer]  of Occupational Health - Occupational Stress Questionnaire    Feeling of Stress: Rather much  Social Connections: Moderately Isolated (10/13/2023)   Social Connection and Isolation Panel    Frequency of Communication with Friends and Family: Twice a week    Frequency of Social Gatherings with Friends and Family: More than three times a week    Attends Religious Services: Never    Database administrator or Organizations: Yes    Attends Banker Meetings: Never    Marital Status: Divorced  Catering manager Violence: Not At Risk (10/13/2023)   Humiliation, Afraid, Rape, and Kick questionnaire    Fear of Current or Ex-Partner: No    Emotionally Abused: No    Physically Abused: No    Sexually Abused: No    Past Surgical History:  Procedure Laterality Date   ABDOMINAL HYSTERECTOMY     APPENDECTOMY     BREAST BIOPSY Left 12/31/2010   neg   CORONARY ARTERY BYPASS GRAFT N/A 05/22/2017   Procedure: CORONARY ARTERY BYPASS GRAFTING (CABG) x 2 WITH ENDOSCOPIC HARVESTING OF RIGHT SAPHENOUS VEIN;  Surgeon: Army Dallas NOVAK, MD;  Location: [facility] OR;  Service: Open Heart Surgery;  Laterality: N/A;   LEFT HEART CATH AND CORONARY ANGIOGRAPHY N/A 05/18/2017   Procedure: LEFT HEART CATH AND CORONARY ANGIOGRAPHY;  Surgeon: [name], MD;  Location: [facility];  Service: Cardiovascular;  Laterality: N/A;   LITHOTRIPSY     RENAL ANGIOGRAPHY N/A 07/31/2022   Procedure: RENAL ANGIOGRAPHY;  Surgeon: [name], MD;  Location: [facility];  Service: Cardiovascular;  Laterality: N/A;   RENAL ANGIOGRAPHY N/A 10/13/2022   Procedure: RENAL ANGIOGRAPHY;  Surgeon: [name], MD;  Location: [facility];  Service: Cardiovascular;  Laterality: N/A;   TEE WITHOUT CARDIOVERSION N/A 05/22/2017   Procedure: TRANSESOPHAGEAL ECHOCARDIOGRAM (TEE);  Surgeon: Army Dallas NOVAK, MD;  Location: [facility] OR;  Service: Open Heart Surgery;  Laterality: N/A;   TONSILLECTOMY      Family History   Problem Relation Age of Onset   Arthritis Mother    Arthritis Father    Heart disease Father    Stroke Father    Sudden Cardiac Death Father    Stroke Son    Breast cancer Maternal Aunt     Allergies  Allergen Reactions   Lisinopril Cough   Ace Inhibitors Other (See Comments)    Has been told to avoid these and any diuretics because left kidney has shrunk up and doesn't work   Baclofen  Other (  See Comments)    Urinary frequency        Latest Ref Rng & Units 09/21/2023   10:19 AM 07/17/2023   10:19 AM 07/10/2023    1:41 PM  CBC  WBC 4.0 - 10.5 K/uL 4.8  4.7  5.8   Hemoglobin 12.0 - 15.0 g/dL 88.6  8.2  8.1   Hematocrit 36.0 - 46.0 % 34.4  27.4  26.3   Platelets 150 - 400 K/uL 187  231  257       CMP     Component Value Date/Time   NA 138 09/21/2023 1019   NA 144 08/19/2019 1533   NA 140 03/01/2012 1132   K 3.6 09/21/2023 1019   K 3.5 03/01/2012 1132   CL 110 09/21/2023 1019   CL 108 (H) 03/01/2012 1132   CO2 20 (L) 09/21/2023 1019   CO2 23 03/01/2012 1132   GLUCOSE 138 (H) 09/21/2023 1019   GLUCOSE 92 03/01/2012 1132   BUN 23 09/21/2023 1019   BUN 20 08/19/2019 1533   BUN 22 (H) 03/01/2012 1132   CREATININE 0.93 09/21/2023 1019   CREATININE 0.99 07/17/2023 1019   CREATININE 1.08 (H) 05/09/2022 1513   CALCIUM  9.0 09/21/2023 1019   CALCIUM  8.6 03/01/2012 1132   PROT 6.3 07/07/2023 1104   PROT 6.9 03/23/2012 0954   ALBUMIN  3.9 07/07/2023 1104   ALBUMIN  3.7 03/23/2012 0954   AST 14 07/07/2023 1104   AST 25 03/23/2012 0954   ALT 11 07/07/2023 1104   ALT 38 03/23/2012 0954   ALKPHOS 71 07/07/2023 1104   ALKPHOS 130 03/23/2012 0954   BILITOT 0.3 07/07/2023 1104   BILITOT 0.6 03/23/2012 0954   GFR 56.56 (L) 07/07/2023 1104   EGFR 55 (L) 05/09/2022 1513   GFRNONAA >60 09/21/2023 1019   GFRNONAA >60 07/17/2023 1019   GFRNONAA >60 03/01/2012 1132     No results found.     Assessment & Plan:   1. Right renal artery stenosis (HCC)  (Primary) Recommend:   BP today was acceptable Given patient's atherosclerosis and PAD optimal control of the patient's hypertension is important.   The patient's noninvasive studies support the previous intervention is patent. No further intervention is indicated at this time.   Therefore the patient  will continue the current medications, no changes at this time.   The primary medical service will continue aggressive antihypertensive therapy as per the [facility] guidelines    Patient will follow-up with duplex ultrasound of the renal arteries as ordered.  2. Mixed hyperlipidemia Continue statin as ordered and reviewed, no changes at this time  3. Blood pressure alteration The patient had a blood pressure that was right at about a 20 point systolic difference.   Current Outpatient Medications on File Prior to Visit  Medication Sig Dispense Refill   acetaminophen  (TYLENOL ) 500 MG tablet Take 1 tablet (500 mg total) by mouth daily as needed for mild pain or headache. 30 tablet 0   albuterol  (VENTOLIN  HFA) 108 (90 Base) MCG/ACT inhaler SMARTSIG:2 Puff(s) By Mouth Every 4-6 Hours PRN     carvedilol  (COREG ) 25 MG tablet TAKE 1 TABLET BY MOUTH TWICE DAILY WITH MEALS 180 tablet 0   dapagliflozin  propanediol (FARXIGA ) 10 MG TABS tablet Take 1 tablet (10 mg total) by mouth daily before breakfast. 30 tablet 3   diltiazem  (CARDIZEM  CD) 180 MG 24 hr capsule Take 1 capsule by mouth once daily 90 capsule 0   ELIQUIS  5 MG TABS tablet Take 1  tablet by mouth twice daily 60 tablet 0   famotidine  (PEPCID ) 20 MG tablet Take 20 mg by mouth 2 (two) times daily.     ferrous sulfate  325 (65 FE) MG EC tablet Take 325 mg by mouth daily with breakfast.     furosemide  (LASIX ) 20 MG tablet USE dialy only when SOB 30 tablet 11   hydrALAZINE  (APRESOLINE ) 100 MG tablet Take 1 tablet (100 mg total) by mouth 3 (three) times daily. 90 tablet 11   isosorbide  mononitrate (IMDUR ) 30 MG 24 hr tablet Take 1 tablet (30 mg  total) by mouth daily. 30 tablet 1   loratadine  (CLARITIN ) 10 MG tablet Take 10 mg by mouth daily.      losartan  (COZAAR ) 100 MG tablet Take 1 tablet (100 mg total) by mouth daily. 30 tablet 11   Multiple Vitamins-Minerals (PRESERVISION AREDS 2) CAPS Take 2 capsules by mouth daily.     NEOMYCIN -POLYMYXIN-HYDROCORTISONE (CORTISPORIN) 1 % SOLN OTIC solution 4 gtt in affected ear(s) tid, max of 10 days. Lie with affected ear upward x 5 minutes 10 mL 0   rosuvastatin  (CRESTOR ) 20 MG tablet Take 1 tablet by mouth once daily 90 tablet 3   SYMBICORT  80-4.5 MCG/ACT inhaler Inhale 2 puffs by mouth twice daily 11 g 0   triamcinolone  (NASACORT  ALLERGY 24HR) 55 MCG/ACT AERO nasal inhaler Place 2 sprays in each nostril daily 10.8 mL 2   vitamin C (ASCORBIC ACID) 250 MG tablet Take 250 mg by mouth daily.     Vitamin D , Ergocalciferol , (DRISDOL) 1.25 MG (50000 UNIT) CAPS capsule Take 50,000 Units by mouth every 7 (seven) days.     No current facility-administered medications on file prior to visit.    There are no Patient Instructions on file for this visit. No follow-ups on file.   Carmellia Kreisler E Dionte Blaustein, NP

## 2023-10-23 ENCOUNTER — Encounter: Payer: Self-pay | Admitting: Cardiovascular Disease

## 2023-10-23 ENCOUNTER — Ambulatory Visit (INDEPENDENT_AMBULATORY_CARE_PROVIDER_SITE_OTHER): Admitting: Cardiovascular Disease

## 2023-10-23 VITALS — BP 146/62 | HR 72 | Ht 69.0 in | Wt 231.0 lb

## 2023-10-23 DIAGNOSIS — I2510 Atherosclerotic heart disease of native coronary artery without angina pectoris: Secondary | ICD-10-CM | POA: Diagnosis not present

## 2023-10-23 DIAGNOSIS — I5032 Chronic diastolic (congestive) heart failure: Secondary | ICD-10-CM

## 2023-10-23 DIAGNOSIS — I480 Paroxysmal atrial fibrillation: Secondary | ICD-10-CM | POA: Diagnosis not present

## 2023-10-23 DIAGNOSIS — I10 Essential (primary) hypertension: Secondary | ICD-10-CM

## 2023-10-23 DIAGNOSIS — I701 Atherosclerosis of renal artery: Secondary | ICD-10-CM

## 2023-10-23 DIAGNOSIS — R040 Epistaxis: Secondary | ICD-10-CM

## 2023-10-23 DIAGNOSIS — N183 Chronic kidney disease, stage 3 unspecified: Secondary | ICD-10-CM

## 2023-10-23 MED ORDER — CARVEDILOL 25 MG PO TABS: ORAL_TABLET | ORAL | 2 refills | Status: DC

## 2023-10-23 NOTE — Assessment & Plan Note (Addendum)
 Uncontrolled hypertension is present with recent elevated readings, including 170/86 today, and variability up to 219/92. She is on multiple antihypertensives. High salt intake and renal artery stenosis contribute, and she has difficulty adhering to a low-salt diet. Continue current medication regimen. Monitor blood pressure at home, especially 1-2 hours after taking medication. Encourage dietary modifications to reduce salt intake. Follow up with cardiology. Declined referral to [facility] due to location.

## 2023-10-23 NOTE — Assessment & Plan Note (Signed)
Encourage healthy diet.

## 2023-10-23 NOTE — Assessment & Plan Note (Signed)
 Iron  deficiency anemia is likely due to chronic gastrointestinal blood loss and is managed with iron  infusions. Hemoglobin has improved from 8 to 10.5. There are no overt signs of GI bleeding. Declines evaluation by GI. She continues Eliquis  for atrial fibrillation. Continue iron  infusions as scheduled and follow up with hematology next month.

## 2023-10-23 NOTE — Assessment & Plan Note (Signed)
 Managed with Crestor  20 mg daily. Continue.

## 2023-10-23 NOTE — Progress Notes (Signed)
 Cardiology Office Note   Date:  10/23/2023   ID:  Audrey Wolf, DOB December 28, 1950, MRN [id number]  PCP:  [name], NP  Cardiologist:  Denyse Bathe, MD      History of Present Illness: Audrey Wolf is a 73 y.o. female who presents for  Chief Complaint  Patient presents with   Acute Visit    High bp    HAD BP RANGE 145/62 TO 223/83, WITH NOSE BLEED.      Past Medical History:  Diagnosis Date   Arthritis    Asthma    CHF (congestive heart failure) (HCC)    Chronic kidney disease    Coronary artery disease    GERD (gastroesophageal reflux disease)    Headache    Hypertension    Nephrolithiasis    NSTEMI (non-ST elevated myocardial infarction) (HCC)    Postprandial RUQ pain 05/25/2019     Past Surgical History:  Procedure Laterality Date   ABDOMINAL HYSTERECTOMY     APPENDECTOMY     BREAST BIOPSY Left 12/31/2010   neg   CORONARY ARTERY BYPASS GRAFT N/A 05/22/2017   Procedure: CORONARY ARTERY BYPASS GRAFTING (CABG) x 2 WITH ENDOSCOPIC HARVESTING OF RIGHT SAPHENOUS VEIN;  Surgeon: Army Dallas NOVAK, MD;  Location: [facility] OR;  Service: Open Heart Surgery;  Laterality: N/A;   LEFT HEART CATH AND CORONARY ANGIOGRAPHY N/A 05/18/2017   Procedure: LEFT HEART CATH AND CORONARY ANGIOGRAPHY;  Surgeon: Bathe Denyse LABOR, MD;  Location: [facility];  Service: Cardiovascular;  Laterality: N/A;   LITHOTRIPSY     RENAL ANGIOGRAPHY N/A 07/31/2022   Procedure: RENAL ANGIOGRAPHY;  Surgeon: [name], MD;  Location: [facility];  Service: Cardiovascular;  Laterality: N/A;   RENAL ANGIOGRAPHY N/A 10/13/2022   Procedure: RENAL ANGIOGRAPHY;  Surgeon: [name], MD;  Location: [facility];  Service: Cardiovascular;  Laterality: N/A;   TEE WITHOUT CARDIOVERSION N/A 05/22/2017   Procedure: TRANSESOPHAGEAL ECHOCARDIOGRAM (TEE);  Surgeon: Army Dallas NOVAK, MD;  Location: [facility] OR;  Service: Open Heart Surgery;  Laterality: N/A;   TONSILLECTOMY        Current Outpatient Medications  Medication Sig Dispense Refill   acetaminophen  (TYLENOL ) 500 MG tablet Take 1 tablet (500 mg total) by mouth daily as needed for mild pain or headache. 30 tablet 0   albuterol  (VENTOLIN  HFA) 108 (90 Base) MCG/ACT inhaler SMARTSIG:2 Puff(s) By Mouth Every 4-6 Hours PRN     carvedilol  (COREG ) 25 MG tablet Take  2 tab twice a day 120 tablet 2   dapagliflozin  propanediol (FARXIGA ) 10 MG TABS tablet Take 1 tablet (10 mg total) by mouth daily before breakfast. 30 tablet 3   diltiazem  (CARDIZEM  CD) 180 MG 24 hr capsule Take 1 capsule by mouth once daily 90 capsule 0   ELIQUIS  5 MG TABS tablet Take 1 tablet by mouth twice daily 60 tablet 0   famotidine  (PEPCID ) 20 MG tablet Take 20 mg by mouth 2 (two) times daily.     ferrous sulfate  325 (65 FE) MG EC tablet Take 325 mg by mouth daily with breakfast.     furosemide  (LASIX ) 20 MG tablet USE dialy only when SOB 30 tablet 11   hydrALAZINE  (APRESOLINE ) 100 MG tablet Take 1 tablet (100 mg total) by mouth 3 (three) times daily. 90 tablet 11   isosorbide  mononitrate (IMDUR ) 30 MG 24 hr tablet Take 1 tablet (30 mg total) by mouth daily. 30 tablet 1   loratadine  (  CLARITIN ) 10 MG tablet Take 10 mg by mouth daily.      losartan  (COZAAR ) 100 MG tablet Take 1 tablet (100 mg total) by mouth daily. 30 tablet 11   Multiple Vitamins-Minerals (PRESERVISION AREDS 2) CAPS Take 2 capsules by mouth daily.     NEOMYCIN -POLYMYXIN-HYDROCORTISONE (CORTISPORIN) 1 % SOLN OTIC solution 4 gtt in affected ear(s) tid, max of 10 days. Lie with affected ear upward x 5 minutes 10 mL 0   rosuvastatin  (CRESTOR ) 20 MG tablet Take 1 tablet by mouth once daily 90 tablet 3   SYMBICORT  80-4.5 MCG/ACT inhaler Inhale 2 puffs by mouth twice daily 11 g 0   triamcinolone  (NASACORT  ALLERGY 24HR) 55 MCG/ACT AERO nasal inhaler Place 2 sprays in each nostril daily 10.8 mL 2   vitamin C (ASCORBIC ACID) 250 MG tablet Take 250 mg by mouth daily.     Vitamin D ,  Ergocalciferol , (DRISDOL) 1.25 MG (50000 UNIT) CAPS capsule Take 50,000 Units by mouth every 7 (seven) days.     No current facility-administered medications for this visit.    Allergies:   Lisinopril, Ace inhibitors, and Baclofen     Social History:   reports that she has never smoked. She has never used smokeless tobacco. She reports that she does not drink alcohol and does not use drugs.   Family History:  family history includes Arthritis in her father and mother; Breast cancer in her maternal aunt; Heart disease in her father; Stroke in her father and son; Sudden Cardiac Death in her father.    ROS:     Review of Systems  Constitutional: Negative.   HENT: Negative.    Eyes: Negative.   Respiratory: Negative.    Gastrointestinal: Negative.   Genitourinary: Negative.   Musculoskeletal: Negative.   Skin: Negative.   Neurological: Negative.   Endo/Heme/Allergies: Negative.   Psychiatric/Behavioral: Negative.    All other systems reviewed and are negative.     All other systems are reviewed and negative.    PHYSICAL EXAM: VS:  BP (!) 146/62   Pulse 72   Ht 5' 9 (1.753 m)   Wt 231 lb (104.8 kg)   SpO2 96%   BMI 34.11 kg/m  , BMI Body mass index is 34.11 kg/m. Last weight:  Wt Readings from Last 3 Encounters:  10/23/23 231 lb (104.8 kg)  10/19/23 229 lb (103.9 kg)  10/13/23 220 lb (99.8 kg)     Physical Exam Constitutional:      Appearance: Normal appearance.  Cardiovascular:     Rate and Rhythm: Normal rate and regular rhythm.     Heart sounds: Normal heart sounds.  Pulmonary:     Effort: Pulmonary effort is normal.     Breath sounds: Normal breath sounds.  Musculoskeletal:     Right lower leg: No edema.     Left lower leg: No edema.  Neurological:     Mental Status: She is alert.       EKG:   Recent Labs: 12/01/2022: B Natriuretic Peptide 153.5 07/07/2023: ALT 11 09/21/2023: BUN 23; Creatinine, Ser 0.93; Hemoglobin 11.3; Platelet Count 187;  Potassium 3.6; Sodium 138    Lipid Panel    Component Value Date/Time   CHOL 111 07/07/2023 1104   CHOL 153 03/01/2012 1132   TRIG 159.0 (H) 07/07/2023 1104   TRIG 127 03/01/2012 1132   HDL 34.90 (L) 07/07/2023 1104   HDL 27 (L) 03/01/2012 1132   CHOLHDL 3 07/07/2023 1104   VLDL 31.8 07/07/2023 1104  VLDL 25 03/01/2012 1132   LDLCALC 44 07/07/2023 1104   LDLCALC 56 05/09/2022 1513   LDLCALC 101 (H) 03/01/2012 1132   LDLDIRECT 64.0 05/17/2019 1058      Other studies Reviewed: Additional studies/ records that were reviewed today include:  Review of the above records demonstrates:       No data to display            ASSESSMENT AND PLAN:    ICD-10-CM   1. Coronary artery disease involving native coronary artery of native heart without angina pectoris  I25.10 carvedilol  (COREG ) 25 MG tablet    2. Chronic diastolic CHF (congestive heart failure) (HCC)  I50.32 carvedilol  (COREG ) 25 MG tablet   CHECK ECHO    3. Paroxysmal atrial fibrillation (HCC)  I48.0 carvedilol  (COREG ) 25 MG tablet    4. Renal artery stenosis (HCC)  I70.1 carvedilol  (COREG ) 25 MG tablet    5. Stage 3 chronic kidney disease, unspecified whether stage 3a or 3b CKD (HCC)  N18.30 carvedilol  (COREG ) 25 MG tablet    6. Bleeding from the nose  R04.0 carvedilol  (COREG ) 25 MG tablet    7. Severe uncontrolled hypertension  I10 carvedilol  (COREG ) 25 MG tablet   change coreg  to 50 bid, as bp at home as high as 223/82, CONTINUE REST MEDS AS IS EXEPT HYDRAZINE 100 BID, AS BP NOW 140/68.       Problem List Items Addressed This Visit       Cardiovascular and Mediastinum   Paroxysmal atrial fibrillation (HCC) (Chronic)   Relevant Medications   carvedilol  (COREG ) 25 MG tablet   CAD (coronary artery disease), native coronary artery - Primary   Relevant Medications   carvedilol  (COREG ) 25 MG tablet   Chronic diastolic CHF (congestive heart failure) (HCC)   Relevant Medications   carvedilol  (COREG ) 25 MG  tablet   Renal artery stenosis (HCC)   Relevant Medications   carvedilol  (COREG ) 25 MG tablet     Genitourinary   CKD (chronic kidney disease) stage 3, GFR 30-59 ml/min (HCC)   Relevant Medications   carvedilol  (COREG ) 25 MG tablet   Other Visit Diagnoses       Bleeding from the nose       Relevant Medications   carvedilol  (COREG ) 25 MG tablet     Severe uncontrolled hypertension       change coreg  to 50 bid, as bp at home as high as 223/82, CONTINUE REST MEDS AS IS EXEPT HYDRAZINE 100 BID, AS BP NOW 140/68.   Relevant Medications   carvedilol  (COREG ) 25 MG tablet          Disposition:   No follow-ups on file.    Total time spent: 30 minutes  Signed,  Denyse Bathe, MD  10/23/2023 2:48 PM    [facility]

## 2023-10-23 NOTE — Assessment & Plan Note (Signed)
Continue current medication regimen. Follow up with Cardiology.

## 2023-10-30 ENCOUNTER — Encounter (INDEPENDENT_AMBULATORY_CARE_PROVIDER_SITE_OTHER): Payer: Self-pay | Admitting: Nurse Practitioner

## 2023-10-30 ENCOUNTER — Other Ambulatory Visit (INDEPENDENT_AMBULATORY_CARE_PROVIDER_SITE_OTHER): Payer: Self-pay | Admitting: Nurse Practitioner

## 2023-10-30 DIAGNOSIS — E782 Mixed hyperlipidemia: Secondary | ICD-10-CM

## 2023-10-30 DIAGNOSIS — R6889 Other general symptoms and signs: Secondary | ICD-10-CM

## 2023-10-30 DIAGNOSIS — I701 Atherosclerosis of renal artery: Secondary | ICD-10-CM

## 2023-11-02 ENCOUNTER — Encounter (INDEPENDENT_AMBULATORY_CARE_PROVIDER_SITE_OTHER): Payer: Self-pay | Admitting: Nurse Practitioner

## 2023-11-02 ENCOUNTER — Ambulatory Visit (INDEPENDENT_AMBULATORY_CARE_PROVIDER_SITE_OTHER)

## 2023-11-02 ENCOUNTER — Ambulatory Visit (INDEPENDENT_AMBULATORY_CARE_PROVIDER_SITE_OTHER): Admitting: Nurse Practitioner

## 2023-11-02 VITALS — BP 112/65 | HR 64 | Ht 69.0 in | Wt 227.4 lb

## 2023-11-02 DIAGNOSIS — R6889 Other general symptoms and signs: Secondary | ICD-10-CM

## 2023-11-02 DIAGNOSIS — I701 Atherosclerosis of renal artery: Secondary | ICD-10-CM

## 2023-11-02 DIAGNOSIS — E782 Mixed hyperlipidemia: Secondary | ICD-10-CM | POA: Diagnosis not present

## 2023-11-02 DIAGNOSIS — I5032 Chronic diastolic (congestive) heart failure: Secondary | ICD-10-CM

## 2023-11-02 NOTE — Progress Notes (Signed)
 Subjective:    Patient ID: Audrey Wolf, female    DOB: 09/25/1950, 72 y.o.   MRN: [id number] Chief Complaint  Patient presents with   Follow-up    The patient presents today for follow-up evaluation of her carotid artery duplex.  This is due to the patient's labile blood pressures.  There was some concern that she may have subclavian stenosis causing the readings to be wildly altered.  Even today she notes that initially she took her blood pressure in the morning and the systolic was in the 200s but after taking her medicine regimen today her systolic is at 112.  She notes that she has had some medication changes done by her cardiologist but they have not been effective.  Today the patient's carotid duplex shows 40 to 59% stenosis in the right ICA with 1 to 39% stenosis of the left ICA.  She has antegrade vertebral artery flow.  No significant stenosis noted in the subclavian arteries bilaterally.    Review of Systems  All other systems reviewed and are negative.      Objective:   Physical Exam Vitals reviewed.  HENT:     Head: Normocephalic.  Cardiovascular:     Rate and Rhythm: Normal rate.  Pulmonary:     Effort: Pulmonary effort is normal.  Skin:    General: Skin is warm and dry.  Neurological:     Mental Status: She is alert and oriented to person, place, and time.  Psychiatric:        Mood and Affect: Mood normal.        Behavior: Behavior normal.        Thought Content: Thought content normal.        Judgment: Judgment normal.     BP 112/65   Pulse 64   Ht 5' 9 (1.753 m)   Wt 227 lb 6 oz (103.1 kg)   BMI 33.58 kg/m   Past Medical History:  Diagnosis Date   Arthritis    Asthma    CHF (congestive heart failure) (HCC)    Chronic kidney disease    Coronary artery disease    GERD (gastroesophageal reflux disease)    Headache    Hypertension    Nephrolithiasis    NSTEMI (non-ST elevated myocardial infarction) (HCC)    Postprandial RUQ pain  05/25/2019    Social History   Socioeconomic History   Marital status: Single    Spouse name: Not on file   Number of children: Not on file   Years of education: Not on file   Highest education level: 12th grade  Occupational History    Employer: [employer]  Tobacco Use   Smoking status: Never   Smokeless tobacco: Never  Vaping Use   Vaping status: Never Used  Substance and Sexual Activity   Alcohol use: No   Drug use: No   Sexual activity: Not Currently    Partners: Male    Birth control/protection: Post-menopausal  Other Topics Concern   Not on file  Social History Narrative   Divorced    Social Drivers of Health   Financial Resource Strain: Low Risk  (10/13/2023)   Overall Financial Resource Strain (CARDIA)    Difficulty of Paying Living Expenses: Not hard at all  Food Insecurity: No Food Insecurity (10/13/2023)   Hunger Vital Sign    Worried About Running Out of Food in the Last Year: Never true    Ran Out of Food in the Last Year: Never  true  Transportation Needs: No Transportation Needs (10/13/2023)   PRAPARE - Administrator, Civil Service (Medical): No    Lack of Transportation (Non-Medical): No  Physical Activity: Inactive (10/13/2023)   Exercise Vital Sign    Days of Exercise per Week: 0 days    Minutes of Exercise per Session: 0 min  Stress: Stress Concern Present (10/13/2023)   [name]    Feeling of Stress: Rather much  Social Connections: Moderately Isolated (10/13/2023)   Social Connection and Isolation Panel    Frequency of Communication with Friends and Family: Twice a week    Frequency of Social Gatherings with Friends and Family: More than three times a week    Attends Religious Services: Never    Database administrator or Organizations: Yes    Attends Banker Meetings: Never    Marital Status: Divorced  Catering manager Violence: Not At Risk (10/13/2023)    Humiliation, Afraid, Rape, and Kick questionnaire    Fear of Current or Ex-Partner: No    Emotionally Abused: No    Physically Abused: No    Sexually Abused: No    Past Surgical History:  Procedure Laterality Date   ABDOMINAL HYSTERECTOMY     APPENDECTOMY     BREAST BIOPSY Left 12/31/2010   neg   CORONARY ARTERY BYPASS GRAFT N/A 05/22/2017   Procedure: CORONARY ARTERY BYPASS GRAFTING (CABG) x 2 WITH ENDOSCOPIC HARVESTING OF RIGHT SAPHENOUS VEIN;  Surgeon: Army Dallas NOVAK, MD;  Location: [facility] OR;  Service: Open Heart Surgery;  Laterality: N/A;   LEFT HEART CATH AND CORONARY ANGIOGRAPHY N/A 05/18/2017   Procedure: LEFT HEART CATH AND CORONARY ANGIOGRAPHY;  Surgeon: [name], MD;  Location: [facility];  Service: Cardiovascular;  Laterality: N/A;   LITHOTRIPSY     RENAL ANGIOGRAPHY N/A 07/31/2022   Procedure: RENAL ANGIOGRAPHY;  Surgeon: [name], MD;  Location: [facility];  Service: Cardiovascular;  Laterality: N/A;   RENAL ANGIOGRAPHY N/A 10/13/2022   Procedure: RENAL ANGIOGRAPHY;  Surgeon: [name], MD;  Location: [facility];  Service: Cardiovascular;  Laterality: N/A;   TEE WITHOUT CARDIOVERSION N/A 05/22/2017   Procedure: TRANSESOPHAGEAL ECHOCARDIOGRAM (TEE);  Surgeon: Army Dallas NOVAK, MD;  Location: [facility] OR;  Service: Open Heart Surgery;  Laterality: N/A;   TONSILLECTOMY      Family History  Problem Relation Age of Onset   Arthritis Mother    Arthritis Father    Heart disease Father    Stroke Father    Sudden Cardiac Death Father    Stroke Son    Breast cancer Maternal Aunt     Allergies  Allergen Reactions   Lisinopril Cough   Ace Inhibitors Other (See Comments)    Has been told to avoid these and any diuretics because left kidney has shrunk up and doesn't work   Baclofen  Other (See Comments)    Urinary frequency        Latest Ref Rng & Units 09/21/2023   10:19 AM 07/17/2023   10:19 AM 07/10/2023    1:41 PM  CBC  WBC 4.0 -  10.5 K/uL 4.8  4.7  5.8   Hemoglobin 12.0 - 15.0 g/dL 88.6  8.2  8.1   Hematocrit 36.0 - 46.0 % 34.4  27.4  26.3   Platelets 150 - 400 K/uL 187  231  257       CMP  Component Value Date/Time   NA 138 09/21/2023 1019   NA 144 08/19/2019 1533   NA 140 03/01/2012 1132   K 3.6 09/21/2023 1019   K 3.5 03/01/2012 1132   CL 110 09/21/2023 1019   CL 108 (H) 03/01/2012 1132   CO2 20 (L) 09/21/2023 1019   CO2 23 03/01/2012 1132   GLUCOSE 138 (H) 09/21/2023 1019   GLUCOSE 92 03/01/2012 1132   BUN 23 09/21/2023 1019   BUN 20 08/19/2019 1533   BUN 22 (H) 03/01/2012 1132   CREATININE 0.93 09/21/2023 1019   CREATININE 0.99 07/17/2023 1019   CREATININE 1.08 (H) 05/09/2022 1513   CALCIUM  9.0 09/21/2023 1019   CALCIUM  8.6 03/01/2012 1132   PROT 6.3 07/07/2023 1104   PROT 6.9 03/23/2012 0954   ALBUMIN  3.9 07/07/2023 1104   ALBUMIN  3.7 03/23/2012 0954   AST 14 07/07/2023 1104   AST 25 03/23/2012 0954   ALT 11 07/07/2023 1104   ALT 38 03/23/2012 0954   ALKPHOS 71 07/07/2023 1104   ALKPHOS 130 03/23/2012 0954   BILITOT 0.3 07/07/2023 1104   BILITOT 0.6 03/23/2012 0954   GFR 56.56 (L) 07/07/2023 1104   EGFR 55 (L) 05/09/2022 1513   GFRNONAA >60 09/21/2023 1019   GFRNONAA >60 07/17/2023 1019   GFRNONAA >60 03/01/2012 1132     No results found.     Assessment & Plan:   1. Right renal artery stenosis (Primary) Previous studies were stable.  Patient will follow-up in 6 months for repeat duplex.  2. Blood pressure alteration Studies today show no evidence of subclavian steal or subclavian artery stenosis.  Based on her studies we will follow-up with a carotid duplex annually however I do not believe this is an underlying cause to her blood pressure issues.  3. Chronic diastolic CHF (congestive heart failure) (HCC) She continues to struggle with blood pressure control.  Her most recent renal artery duplex is consistent with previous studies, and based on the velocities I do not  feel that she has had a significant restenosis as a cause of her blood pressure issues.  She previously was at the CHF clinic and there was some discussion about medication changes to try to help her blood pressure.  Will refer her back to the heart failure clinic to see if these changes may have a positive effect on her blood pressure. - AMB referral to CHF clinic   Current Outpatient Medications on File Prior to Visit  Medication Sig Dispense Refill   acetaminophen  (TYLENOL ) 500 MG tablet Take 1 tablet (500 mg total) by mouth daily as needed for mild pain or headache. 30 tablet 0   albuterol  (VENTOLIN  HFA) 108 (90 Base) MCG/ACT inhaler SMARTSIG:2 Puff(s) By Mouth Every 4-6 Hours PRN     carvedilol  (COREG ) 25 MG tablet Take  2 tab twice a day 120 tablet 2   dapagliflozin  propanediol (FARXIGA ) 10 MG TABS tablet Take 1 tablet (10 mg total) by mouth daily before breakfast. 30 tablet 3   diltiazem  (CARDIZEM  CD) 180 MG 24 hr capsule Take 1 capsule by mouth once daily 90 capsule 0   ELIQUIS  5 MG TABS tablet Take 1 tablet by mouth twice daily 60 tablet 0   famotidine  (PEPCID ) 20 MG tablet Take 20 mg by mouth 2 (two) times daily.     ferrous sulfate  325 (65 FE) MG EC tablet Take 325 mg by mouth daily with breakfast.     furosemide  (LASIX ) 20 MG tablet USE dialy only  when SOB 30 tablet 11   hydrALAZINE  (APRESOLINE ) 100 MG tablet Take 1 tablet (100 mg total) by mouth 3 (three) times daily. 90 tablet 11   isosorbide  mononitrate (IMDUR ) 30 MG 24 hr tablet Take 1 tablet (30 mg total) by mouth daily. 30 tablet 1   loratadine  (CLARITIN ) 10 MG tablet Take 10 mg by mouth daily.      losartan  (COZAAR ) 100 MG tablet Take 1 tablet (100 mg total) by mouth daily. 30 tablet 11   Multiple Vitamins-Minerals (PRESERVISION AREDS 2) CAPS Take 2 capsules by mouth daily.     NEOMYCIN -POLYMYXIN-HYDROCORTISONE (CORTISPORIN) 1 % SOLN OTIC solution 4 gtt in affected ear(s) tid, max of 10 days. Lie with affected ear upward x 5  minutes 10 mL 0   rosuvastatin  (CRESTOR ) 20 MG tablet Take 1 tablet by mouth once daily 90 tablet 3   SYMBICORT  80-4.5 MCG/ACT inhaler Inhale 2 puffs by mouth twice daily 11 g 0   triamcinolone  (NASACORT  ALLERGY 24HR) 55 MCG/ACT AERO nasal inhaler Place 2 sprays in each nostril daily 10.8 mL 2   vitamin C (ASCORBIC ACID) 250 MG tablet Take 250 mg by mouth daily.     Vitamin D , Ergocalciferol , (DRISDOL) 1.25 MG (50000 UNIT) CAPS capsule Take 50,000 Units by mouth every 7 (seven) days.     No current facility-administered medications on file prior to visit.    There are no Patient Instructions on file for this visit. No follow-ups on file.   Rayvn Rickerson E Eshaan Titzer, NP

## 2023-11-27 ENCOUNTER — Encounter: Payer: Self-pay | Admitting: Cardiovascular Disease

## 2023-11-27 ENCOUNTER — Ambulatory Visit (INDEPENDENT_AMBULATORY_CARE_PROVIDER_SITE_OTHER): Admitting: Cardiovascular Disease

## 2023-11-27 ENCOUNTER — Other Ambulatory Visit: Payer: Self-pay | Admitting: Cardiovascular Disease

## 2023-11-27 VITALS — BP 124/70 | HR 67 | Ht 69.0 in | Wt 229.2 lb

## 2023-11-27 DIAGNOSIS — Z131 Encounter for screening for diabetes mellitus: Secondary | ICD-10-CM

## 2023-11-27 DIAGNOSIS — I10 Essential (primary) hypertension: Secondary | ICD-10-CM | POA: Diagnosis not present

## 2023-11-27 DIAGNOSIS — I5032 Chronic diastolic (congestive) heart failure: Secondary | ICD-10-CM

## 2023-11-27 DIAGNOSIS — N183 Chronic kidney disease, stage 3 unspecified: Secondary | ICD-10-CM | POA: Diagnosis not present

## 2023-11-27 DIAGNOSIS — I480 Paroxysmal atrial fibrillation: Secondary | ICD-10-CM

## 2023-11-27 DIAGNOSIS — I2510 Atherosclerotic heart disease of native coronary artery without angina pectoris: Secondary | ICD-10-CM

## 2023-11-27 DIAGNOSIS — Z951 Presence of aortocoronary bypass graft: Secondary | ICD-10-CM

## 2023-11-27 NOTE — Progress Notes (Signed)
 Cardiology Office Note   Date:  11/27/2023   ID:  Audrey Wolf, DOB July 29, 1950, MRN [id number]  PCP:  [name], NP  Cardiologist:  Denyse Bathe, MD      History of Present Illness: Audrey Wolf is a 73 y.o. female who presents for  Chief Complaint  Patient presents with   Follow-up    3 month follow up    Occasional SOB, no chest pain.       Past Medical History:  Diagnosis Date   Arthritis    Asthma    CHF (congestive heart failure) (HCC)    Chronic kidney disease    Coronary artery disease    GERD (gastroesophageal reflux disease)    Headache    Hypertension    Nephrolithiasis    NSTEMI (non-ST elevated myocardial infarction) (HCC)    Postprandial RUQ pain 05/25/2019     Past Surgical History:  Procedure Laterality Date   ABDOMINAL HYSTERECTOMY     APPENDECTOMY     BREAST BIOPSY Left 12/31/2010   neg   CORONARY ARTERY BYPASS GRAFT N/A 05/22/2017   Procedure: CORONARY ARTERY BYPASS GRAFTING (CABG) x 2 WITH ENDOSCOPIC HARVESTING OF RIGHT SAPHENOUS VEIN;  Surgeon: Army Dallas NOVAK, MD;  Location: [facility] OR;  Service: Open Heart Surgery;  Laterality: N/A;   LEFT HEART CATH AND CORONARY ANGIOGRAPHY N/A 05/18/2017   Procedure: LEFT HEART CATH AND CORONARY ANGIOGRAPHY;  Surgeon: Bathe Denyse LABOR, MD;  Location: [facility];  Service: Cardiovascular;  Laterality: N/A;   LITHOTRIPSY     RENAL ANGIOGRAPHY N/A 07/31/2022   Procedure: RENAL ANGIOGRAPHY;  Surgeon: [name], MD;  Location: [facility];  Service: Cardiovascular;  Laterality: N/A;   RENAL ANGIOGRAPHY N/A 10/13/2022   Procedure: RENAL ANGIOGRAPHY;  Surgeon: [name], MD;  Location: [facility];  Service: Cardiovascular;  Laterality: N/A;   TEE WITHOUT CARDIOVERSION N/A 05/22/2017   Procedure: TRANSESOPHAGEAL ECHOCARDIOGRAM (TEE);  Surgeon: Army Dallas NOVAK, MD;  Location[facility] OR;  Service: Open Heart Surgery;  Laterality: N/A;   TONSILLECTOMY       Current  Outpatient Medications  Medication Sig Dispense Refill   acetaminophen  (TYLENOL ) 500 MG tablet Take 1 tablet (500 mg total) by mouth daily as needed for mild pain or headache. 30 tablet 0   albuterol  (VENTOLIN  HFA) 108 (90 Base) MCG/ACT inhaler SMARTSIG:2 Puff(s) By Mouth Every 4-6 Hours PRN     carvedilol  (COREG ) 25 MG tablet Take  2 tab twice a day 120 tablet 2   dapagliflozin  propanediol (FARXIGA ) 10 MG TABS tablet Take 1 tablet (10 mg total) by mouth daily before breakfast. 30 tablet 3   diltiazem  (CARDIZEM  CD) 180 MG 24 hr capsule Take 1 capsule by mouth once daily 90 capsule 0   ELIQUIS  5 MG TABS tablet Take 1 tablet by mouth twice daily 60 tablet 0   famotidine  (PEPCID ) 20 MG tablet Take 20 mg by mouth 2 (two) times daily.     ferrous sulfate  325 (65 FE) MG EC tablet Take 325 mg by mouth daily with breakfast.     furosemide  (LASIX ) 20 MG tablet USE dialy only when SOB 30 tablet 11   hydrALAZINE  (APRESOLINE ) 100 MG tablet Take 1 tablet (100 mg total) by mouth 3 (three) times daily. 90 tablet 11   isosorbide  mononitrate (IMDUR ) 30 MG 24 hr tablet Take 1 tablet (30 mg total) by mouth daily. 30 tablet 1   loratadine  (CLARITIN ) 10  MG tablet Take 10 mg by mouth daily.      losartan  (COZAAR ) 100 MG tablet Take 1 tablet (100 mg total) by mouth daily. 30 tablet 11   Multiple Vitamins-Minerals (PRESERVISION AREDS 2) CAPS Take 2 capsules by mouth daily.     NEOMYCIN -POLYMYXIN-HYDROCORTISONE (CORTISPORIN) 1 % SOLN OTIC solution 4 gtt in affected ear(s) tid, max of 10 days. Lie with affected ear upward x 5 minutes 10 mL 0   rosuvastatin  (CRESTOR ) 20 MG tablet Take 1 tablet by mouth once daily 90 tablet 3   SYMBICORT  80-4.5 MCG/ACT inhaler Inhale 2 puffs by mouth twice daily 11 g 0   triamcinolone  (NASACORT  ALLERGY 24HR) 55 MCG/ACT AERO nasal inhaler Place 2 sprays in each nostril daily 10.8 mL 2   vitamin C (ASCORBIC ACID) 250 MG tablet Take 250 mg by mouth daily.     Vitamin D , Ergocalciferol ,  (DRISDOL) 1.25 MG (50000 UNIT) CAPS capsule Take 50,000 Units by mouth every 7 (seven) days.     No current facility-administered medications for this visit.    Allergies:   Lisinopril, Ace inhibitors, and Baclofen     Social History:   reports that she has never smoked. She has never used smokeless tobacco. She reports that she does not drink alcohol and does not use drugs.   Family History:  family history includes Arthritis in her father and mother; Breast cancer in her maternal aunt; Heart disease in her father; Stroke in her father and son; Sudden Cardiac Death in her father.    ROS:     Review of Systems  Constitutional: Negative.   HENT: Negative.    Eyes: Negative.   Respiratory: Negative.    Gastrointestinal: Negative.   Genitourinary: Negative.   Musculoskeletal: Negative.   Skin: Negative.   Neurological: Negative.   Endo/Heme/Allergies: Negative.   Psychiatric/Behavioral: Negative.    All other systems reviewed and are negative.     All other systems are reviewed and negative.    PHYSICAL EXAM: VS:  BP 124/70   Pulse 67   Ht 5' 9 (1.753 m)   Wt 229 lb 3.2 oz (104 kg)   SpO2 96%   BMI 33.85 kg/m  , BMI Body mass index is 33.85 kg/m. Last weight:  Wt Readings from Last 3 Encounters:  11/27/23 229 lb 3.2 oz (104 kg)  11/02/23 227 lb 6 oz (103.1 kg)  10/23/23 231 lb (104.8 kg)     Physical Exam Constitutional:      Appearance: Normal appearance.  Cardiovascular:     Rate and Rhythm: Normal rate and regular rhythm.     Heart sounds: Normal heart sounds.  Pulmonary:     Effort: Pulmonary effort is normal.     Breath sounds: Normal breath sounds.  Musculoskeletal:     Right lower leg: No edema.     Left lower leg: No edema.  Neurological:     Mental Status: She is alert.       EKG:   Recent Labs: 12/01/2022: B Natriuretic Peptide 153.5 07/07/2023: ALT 11 09/21/2023: BUN 23; Creatinine, Ser 0.93; Hemoglobin 11.3; Platelet Count 187; Potassium  3.6; Sodium 138    Lipid Panel    Component Value Date/Time   CHOL 111 07/07/2023 1104   CHOL 153 03/01/2012 1132   TRIG 159.0 (H) 07/07/2023 1104   TRIG 127 03/01/2012 1132   HDL 34.90 (L) 07/07/2023 1104   HDL 27 (L) 03/01/2012 1132   CHOLHDL 3 07/07/2023 1104   VLDL 31.8 07/07/2023  1104   VLDL 25 03/01/2012 1132   LDLCALC 44 07/07/2023 1104   LDLCALC 56 05/09/2022 1513   LDLCALC 101 (H) 03/01/2012 1132   LDLDIRECT 64.0 05/17/2019 1058      Other studies Reviewed: Additional studies/ records that were reviewed today include:  Review of the above records demonstrates:       No data to display            ASSESSMENT AND PLAN:    ICD-10-CM   1. Paroxysmal atrial fibrillation (HCC)  I48.0     2. Stage 3 chronic kidney disease, unspecified whether stage 3a or 3b CKD (HCC)  N18.30     3. Chronic diastolic CHF (congestive heart failure) (HCC)  I50.32    Occasional SOB, continue GDMT    4. Coronary artery disease involving native coronary artery of native heart without angina pectoris  I25.10     5. Primary hypertension  I10     6. 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    Total time spent: 40 minutes  Signed,  Denyse Bathe, MD  11/27/2023 10:11 AM    [facility]

## 2023-12-13 NOTE — Progress Notes (Unsigned)
 Advanced [facility] Failure Clinic Note    PCP: [name], NP Cardiology: Denyse Bathe, MD (last seen 10/25) HF Cardiology: Dr. Rolan  Chief Complaint:   HPI:  Audrey Wolf is a 73 y.o. female with a history of CAD s/p CAVG, CKD stage 3a, paroxysmal atrial fibrillation and right renal artery stenosis was referred by Dr. Isadora for evaluation of CHF and pulmonary hypertension. Patient has a long history of poorly controlled HTN.  She has an atrophic left kidney and has right renal artery stenosis now s/p stenting in 6/24 by Dr. Marea.  She had LHC in 4/19 showing 70% ostial left main stenosis and subsequently had CABG x 2 with LIMA-LAD and SVG-OM by Dr. Army at [facility]. Echo in 6/24 at outside facility was read as EF 55-60%, mild LVH, grade 2 diastolic dysfunction, normal RV, mild MR, mild AI, mild pulmonary hypertension.  She had a Cardiolite  in 5/24 that showed no ischemia or infarction.   Saw Dr. Isadora 10/24 for evaluation of her chronic dyspnea. She has never smoked and does not have known lung disease. PFTs in 10/24 showed normal spirometry but decreased DLCO, possibly consistent with pulmonary vascular disease. He referred her to this clinic due to concern for CHF and pulmonary hypertension.   Was in the ED 12/30/22 due to bleeding gums.   Was in the ED 01/11/23 for nonspecific chest pain. Evaluation negative and low suspicion for ACS.   Was in the ED 07/10/23 after being sent from PCP office for low Hg. Patient did not want admission since she's asymptomatic and preferred outpatient f/u.   She presents today for a HF visit with a chief complaint of   Labs (4/24): LDL 56 Labs (10/24): K 4.4, creatinine 1.26 Labs (11/24): K 3.9, creatinine 1.01, Hg 10 Labs (12/24): K 3.7, creatinine 1.11, HS-trop 4, Hg 9.8 Labs (6/25): K 3.9, creatinine 0.99, LDL 44, Ferritin 5.9, Hg 8.2 Labs (7/25): K 4.1, creatinine 0.64, Hg 11.2 Labs (8/25): K 3.6, creatinine 0.93, Hg 11.3  PMH: 1. CKD  stage 3 2. Left hydronephrosis with atrophic left kidney 3. Atrial fibrillation: Paroxysmal 4. CAD: LHC in 4/19 with 70% ostial LM stenosis.  Patient had CABG x 2 in 4/19 by Dr. Army with LIMA-LAD and SVG-OM.  - Cardiolite  (5/24): EF 62%, normal study.  5. Chronic diastolic CHF: Echo (6/24) with EF 55-60%, mild LVH, grade 2 diastolic dysfunction, normal RV, mild MR, mild AI, mild pulmonary hypertension.    6. HTN: Difficult to control.  - ACEI cough 7. Right renal artery stenosis: Stent to right renal artery in 6/24 (Dr. Marea).  8. Hyperlipidemia 9. PFTs (10/24): Normal spirometry but decreased DLCO.   FH: Father with MI, CVA.  Mother with MI.   Social History   Socioeconomic History   Marital status: Single    Spouse name: Not on file   Number of children: Not on file   Years of education: Not on file   Highest education level: 12th grade  Occupational History    Employer: [employer]  Tobacco Use   Smoking status: Never   Smokeless tobacco: Never  Vaping Use   Vaping status: Never Used  Substance and Sexual Activity   Alcohol use: No   Drug use: No   Sexual activity: Not Currently    Partners: Male    Birth control/protection: Post-menopausal  Other Topics Concern   Not on file  Social History Narrative   Divorced    Social Drivers of Health  Financial Resource Strain: Low Risk  (10/13/2023)   Overall Financial Resource Strain (CARDIA)    Difficulty of Paying Living Expenses: Not hard at all  Food Insecurity: No Food Insecurity (10/13/2023)   Hunger Vital Sign    Worried About Running Out of Food in the Last Year: Never true    Ran Out of Food in the Last Year: Never true  Transportation Needs: No Transportation Needs (10/13/2023)   PRAPARE - Administrator, Civil Service (Medical): No    Lack of Transportation (Non-Medical): No  Physical Activity: Inactive (10/13/2023)   Exercise Vital Sign    Days of Exercise per Week: 0 days    Minutes of Exercise  per Session: 0 min  Stress: Stress Concern Present (10/13/2023)   [name]    Feeling of Stress: Rather much  Social Connections: Moderately Isolated (10/13/2023)   Social Connection and Isolation Panel    Frequency of Communication with Friends and Family: Twice a week    Frequency of Social Gatherings with Friends and Family: More than three times a week    Attends Religious Services: Never    Database Administrator or Organizations: Yes    Attends Banker Meetings: Never    Marital Status: Divorced  Catering Manager Violence: Not At Risk (10/13/2023)   Humiliation, Afraid, Rape, and Kick questionnaire    Fear of Current or Ex-Partner: No    Emotionally Abused: No    Physically Abused: No    Sexually Abused: No   ROS: All systems reviewed and negative except as per HPI.   Current Outpatient Medications  Medication Sig Dispense Refill   acetaminophen  (TYLENOL ) 500 MG tablet Take 1 tablet (500 mg total) by mouth daily as needed for mild pain or headache. 30 tablet 0   albuterol  (VENTOLIN  HFA) 108 (90 Base) MCG/ACT inhaler SMARTSIG:2 Puff(s) By Mouth Every 4-6 Hours PRN     carvedilol  (COREG ) 25 MG tablet Take  2 tab twice a day 120 tablet 2   dapagliflozin  propanediol (FARXIGA ) 10 MG TABS tablet Take 1 tablet (10 mg total) by mouth daily before breakfast. 30 tablet 3   diltiazem  (CARDIZEM  CD) 180 MG 24 hr capsule Take 1 capsule by mouth once daily 90 capsule 0   ELIQUIS  5 MG TABS tablet Take 1 tablet by mouth twice daily 60 tablet 0   famotidine  (PEPCID ) 20 MG tablet Take 20 mg by mouth 2 (two) times daily.     ferrous sulfate  325 (65 FE) MG EC tablet Take 325 mg by mouth daily with breakfast.     furosemide  (LASIX ) 20 MG tablet USE dialy only when SOB 30 tablet 11   hydrALAZINE  (APRESOLINE ) 100 MG tablet Take 1 tablet (100 mg total) by mouth 3 (three) times daily. 90 tablet 11   isosorbide  mononitrate (IMDUR )  30 MG 24 hr tablet Take 1 tablet (30 mg total) by mouth daily. 30 tablet 1   loratadine  (CLARITIN ) 10 MG tablet Take 10 mg by mouth daily.      losartan  (COZAAR ) 100 MG tablet Take 1 tablet (100 mg total) by mouth daily. 30 tablet 11   Multiple Vitamins-Minerals (PRESERVISION AREDS 2) CAPS Take 2 capsules by mouth daily.     NEOMYCIN -POLYMYXIN-HYDROCORTISONE (CORTISPORIN) 1 % SOLN OTIC solution 4 gtt in affected ear(s) tid, max of 10 days. Lie with affected ear upward x 5 minutes 10 mL 0   rosuvastatin  (CRESTOR ) 20 MG tablet Take  1 tablet by mouth once daily 90 tablet 3   SYMBICORT  80-4.5 MCG/ACT inhaler Inhale 2 puffs by mouth twice daily 11 g 0   triamcinolone  (NASACORT  ALLERGY 24HR) 55 MCG/ACT AERO nasal inhaler Place 2 sprays in each nostril daily 10.8 mL 2   vitamin C (ASCORBIC ACID) 250 MG tablet Take 250 mg by mouth daily.     Vitamin D , Ergocalciferol , (DRISDOL) 1.25 MG (50000 UNIT) CAPS capsule Take 50,000 Units by mouth every 7 (seven) days.     No current facility-administered medications for this visit.      Physical Exam:  General: NAD Neck: No JVD, no thyromegaly or thyroid  nodule.  Lungs: Clear to auscultation bilaterally with normal respiratory effort. CV: Nondisplaced PMI.  Heart regular S1/S2, no S3/S4, no murmur.  No peripheral edema.  No carotid bruit.  Normal pedal pulses.  Abdomen: Soft, nontender, no hepatosplenomegaly, no distention.  Skin: Intact without lesions or rashes.  Neurologic: Alert and oriented x 3.  Psych: Normal affect. Extremities: No clubbing or cyanosis.  HEENT: Normal.   Assessment/Plan:  1. Chronic diastolic CHF/pulmonary hypertension: Echo in 6/24 showed EF 55-60%, mild LVH, grade 2 diastolic dysfunction, normal RV, mild MR, mild AI, mild pulmonary hypertension.  PFTs in 10/24 showed normal spirometry but there was decreased DLCO concerning for a pulmonary vascular process.  She never smoked. NYHA class II-III symptoms, not clearly volume  overloaded on exam.  - I think she would benefit from SGLT2 inhibitor, would like to get RHC first though.  2. CAD: S/p CABG in 4/19 with LIMA-LAD and SVG-OM.  She had a negative Cardiolite  in 5/24.  No chest pain.  - Continue statin, good lipids in 6/25.  - She is on Plavix  post-right renal artery stent placement.  3. CKD stage 3a: Atrophic left kidney, s/p right renal artery stent placement. BMET today 4. Renal artery stenosis: On right, s/p stent placement.  - Continue Plavix  for now, will eventually stop given Eliquis  use.  5. Atrial fibrillation: She is in NSR today and has only rare palpitations.   - Continue Eliquis .  6. HTN: Historically poor control. Renal artery stenosis s/p PCI right renal artery.  BP high today but she says that sometimes it is low.  She keeps a BP log but did not bring it today.  - She forgets hydralazine  frequently, consolidate dosing to 50 mg tid rather than 25 mg qid.  - Continue losartan  100 mg daily - Continue Coreg  25 mg bid.  - She is on diltiazem  CD.  Will have her bring BP log to next appointment and if BP runs high, will change diltiazem  CD to amlodipine .     Ellouise DELENA Class 12/13/2023

## 2023-12-14 ENCOUNTER — Ambulatory Visit: Attending: Family | Admitting: Family

## 2023-12-14 ENCOUNTER — Encounter: Payer: Self-pay | Admitting: Family

## 2023-12-14 ENCOUNTER — Ambulatory Visit: Admitting: Cardiology

## 2023-12-14 VITALS — BP 176/66 | HR 72 | Wt 231.0 lb

## 2023-12-14 DIAGNOSIS — I272 Pulmonary hypertension, unspecified: Secondary | ICD-10-CM | POA: Insufficient documentation

## 2023-12-14 DIAGNOSIS — Z91148 Patient's other noncompliance with medication regimen for other reason: Secondary | ICD-10-CM | POA: Insufficient documentation

## 2023-12-14 DIAGNOSIS — I130 Hypertensive heart and chronic kidney disease with heart failure and stage 1 through stage 4 chronic kidney disease, or unspecified chronic kidney disease: Secondary | ICD-10-CM | POA: Diagnosis not present

## 2023-12-14 DIAGNOSIS — Z7901 Long term (current) use of anticoagulants: Secondary | ICD-10-CM | POA: Insufficient documentation

## 2023-12-14 DIAGNOSIS — Z7984 Long term (current) use of oral hypoglycemic drugs: Secondary | ICD-10-CM | POA: Insufficient documentation

## 2023-12-14 DIAGNOSIS — I701 Atherosclerosis of renal artery: Secondary | ICD-10-CM | POA: Diagnosis not present

## 2023-12-14 DIAGNOSIS — N1831 Chronic kidney disease, stage 3a: Secondary | ICD-10-CM | POA: Diagnosis not present

## 2023-12-14 DIAGNOSIS — Z79899 Other long term (current) drug therapy: Secondary | ICD-10-CM | POA: Diagnosis not present

## 2023-12-14 DIAGNOSIS — N261 Atrophy of kidney (terminal): Secondary | ICD-10-CM | POA: Insufficient documentation

## 2023-12-14 DIAGNOSIS — Z951 Presence of aortocoronary bypass graft: Secondary | ICD-10-CM | POA: Diagnosis not present

## 2023-12-14 DIAGNOSIS — I480 Paroxysmal atrial fibrillation: Secondary | ICD-10-CM | POA: Insufficient documentation

## 2023-12-14 DIAGNOSIS — I10 Essential (primary) hypertension: Secondary | ICD-10-CM

## 2023-12-14 DIAGNOSIS — N182 Chronic kidney disease, stage 2 (mild): Secondary | ICD-10-CM | POA: Diagnosis not present

## 2023-12-14 DIAGNOSIS — I2510 Atherosclerotic heart disease of native coronary artery without angina pectoris: Secondary | ICD-10-CM | POA: Diagnosis not present

## 2023-12-14 DIAGNOSIS — Z7902 Long term (current) use of antithrombotics/antiplatelets: Secondary | ICD-10-CM | POA: Insufficient documentation

## 2023-12-14 DIAGNOSIS — Z955 Presence of coronary angioplasty implant and graft: Secondary | ICD-10-CM | POA: Diagnosis not present

## 2023-12-14 DIAGNOSIS — I5032 Chronic diastolic (congestive) heart failure: Secondary | ICD-10-CM | POA: Diagnosis present

## 2023-12-14 MED ORDER — VALSARTAN 160 MG PO TABS: 160.0000 mg | ORAL_TABLET | Freq: Every day | ORAL | 3 refills | Status: DC

## 2023-12-14 NOTE — Patient Instructions (Signed)
 Medication Changes:  STOP Losartan    START Valsartan  160mg  (1 tab) daily  Lab Work:   Go downstairs to [facility] on [address] to have your blood work completed.  We will only call you if the results are abnormal or if the provider would like to make medication changes.  No news is good news.   Follow-Up in: Please follow up with the Advanced [facility] Failure Clinic in 1 month with Ellouise Class, FNP.   Thank you for choosing [facility].    At the Advanced [facility] Clinic, you and your health needs are our priority. We have a designated team specialized in the treatment of Heart Failure. This Care Team includes your primary Heart Failure Specialized Cardiologist (physician), Advanced Practice Providers (APPs- Physician Assistants and Nurse Practitioners), and Pharmacist who all work together to provide you with the care you need, when you need it.   You may see any of the following providers on your designated Care Team at your next follow up:  Dr. Toribio Fuel Dr. Ezra Shuck Dr. Ria Commander Dr. Morene Brownie Ellouise Class, FNP Jaun Bash, RPH-CPP  Please be sure to bring in all your medications bottles to every appointment.   Need to Contact Us :  If you have any questions or concerns before your next appointment please send us  a message through [place] or call our office at [phone].    TO LEAVE A MESSAGE FOR THE NURSE SELECT OPTION 2, PLEASE LEAVE A MESSAGE INCLUDING: YOUR NAME DATE OF BIRTH CALL BACK NUMBER REASON FOR CALL**this is important as we prioritize the call backs  YOU WILL RECEIVE A CALL BACK THE SAME DAY AS LONG AS YOU CALL BEFORE 4:00 PM

## 2023-12-21 ENCOUNTER — Ambulatory Visit: Payer: Self-pay | Admitting: Family

## 2023-12-21 LAB — BASIC METABOLIC PANEL WITH GFR
BUN/Creatinine Ratio: 19 (ref 12–28)
BUN: 21 mg/dL (ref 8–27)
Calcium: 9.9 mg/dL (ref 8.7–10.3)
Chloride: 112 mmol/L — ABNORMAL HIGH (ref 96–106)
Creatinine, Ser: 1.09 mg/dL — ABNORMAL HIGH (ref 0.57–1.00)
Glucose: 92 mg/dL (ref 70–99)
Potassium: 4.7 mmol/L (ref 3.5–5.2)
Sodium: 147 mmol/L — ABNORMAL HIGH (ref 134–144)
eGFR: 54 mL/min/1.73 — ABNORMAL LOW (ref >59)

## 2024-01-08 ENCOUNTER — Ambulatory Visit: Admitting: Nurse Practitioner

## 2024-01-14 ENCOUNTER — Other Ambulatory Visit: Payer: Self-pay | Admitting: Cardiovascular Disease

## 2024-01-14 DIAGNOSIS — I5032 Chronic diastolic (congestive) heart failure: Secondary | ICD-10-CM

## 2024-01-14 DIAGNOSIS — I701 Atherosclerosis of renal artery: Secondary | ICD-10-CM

## 2024-01-14 DIAGNOSIS — R040 Epistaxis: Secondary | ICD-10-CM

## 2024-01-14 DIAGNOSIS — I2510 Atherosclerotic heart disease of native coronary artery without angina pectoris: Secondary | ICD-10-CM

## 2024-01-14 DIAGNOSIS — N183 Chronic kidney disease, stage 3 unspecified: Secondary | ICD-10-CM

## 2024-01-14 DIAGNOSIS — I480 Paroxysmal atrial fibrillation: Secondary | ICD-10-CM

## 2024-01-14 DIAGNOSIS — I10 Essential (primary) hypertension: Secondary | ICD-10-CM

## 2024-01-15 ENCOUNTER — Ambulatory Visit: Admitting: Family

## 2024-01-22 ENCOUNTER — Encounter: Payer: Self-pay | Admitting: Internal Medicine

## 2024-01-22 ENCOUNTER — Inpatient Hospital Stay

## 2024-01-22 ENCOUNTER — Inpatient Hospital Stay: Attending: Internal Medicine

## 2024-01-22 ENCOUNTER — Ambulatory Visit: Admitting: Internal Medicine

## 2024-01-22 VITALS — BP 147/54 | HR 59

## 2024-01-22 VITALS — BP 156/69 | HR 74 | Temp 97.6°F | Resp 20 | Ht 69.0 in | Wt 225.0 lb

## 2024-01-22 DIAGNOSIS — D509 Iron deficiency anemia, unspecified: Secondary | ICD-10-CM | POA: Diagnosis present

## 2024-01-22 DIAGNOSIS — I4891 Unspecified atrial fibrillation: Secondary | ICD-10-CM | POA: Diagnosis not present

## 2024-01-22 DIAGNOSIS — D649 Anemia, unspecified: Secondary | ICD-10-CM

## 2024-01-22 DIAGNOSIS — Z803 Family history of malignant neoplasm of breast: Secondary | ICD-10-CM | POA: Insufficient documentation

## 2024-01-22 DIAGNOSIS — I2510 Atherosclerotic heart disease of native coronary artery without angina pectoris: Secondary | ICD-10-CM | POA: Insufficient documentation

## 2024-01-22 DIAGNOSIS — Z7901 Long term (current) use of anticoagulants: Secondary | ICD-10-CM | POA: Insufficient documentation

## 2024-01-22 LAB — CBC WITH DIFFERENTIAL (CANCER CENTER ONLY)
Abs Immature Granulocytes: 0.01 K/uL (ref 0.00–0.07)
Basophils Absolute: 0 K/uL (ref 0.0–0.1)
Basophils Relative: 0 %
Eosinophils Absolute: 0.1 K/uL (ref 0.0–0.5)
Eosinophils Relative: 1 %
HCT: 30.4 % — ABNORMAL LOW (ref 36.0–46.0)
Hemoglobin: 10.1 g/dL — ABNORMAL LOW (ref 12.0–15.0)
Immature Granulocytes: 0 %
Lymphocytes Relative: 39 %
Lymphs Abs: 2.1 K/uL (ref 0.7–4.0)
MCH: 30.7 pg (ref 26.0–34.0)
MCHC: 33.2 g/dL (ref 30.0–36.0)
MCV: 92.4 fL (ref 80.0–100.0)
Monocytes Absolute: 0.3 K/uL (ref 0.1–1.0)
Monocytes Relative: 6 %
Neutro Abs: 2.9 K/uL (ref 1.7–7.7)
Neutrophils Relative %: 54 %
Platelet Count: 212 K/uL (ref 150–400)
RBC: 3.29 MIL/uL — ABNORMAL LOW (ref 3.87–5.11)
RDW: 12.4 % (ref 11.5–15.5)
WBC Count: 5.4 K/uL (ref 4.0–10.5)
nRBC: 0 % (ref 0.0–0.2)

## 2024-01-22 LAB — BASIC METABOLIC PANEL - CANCER CENTER ONLY
Anion gap: 11 (ref 5–15)
BUN: 27 mg/dL — ABNORMAL HIGH (ref 8–23)
CO2: 20 mmol/L — ABNORMAL LOW (ref 22–32)
Calcium: 8.9 mg/dL (ref 8.9–10.3)
Chloride: 113 mmol/L — ABNORMAL HIGH (ref 98–111)
Creatinine: 1.01 mg/dL — ABNORMAL HIGH (ref 0.44–1.00)
GFR, Estimated: 58 mL/min — ABNORMAL LOW
Glucose, Bld: 130 mg/dL — ABNORMAL HIGH (ref 70–99)
Potassium: 4.5 mmol/L (ref 3.5–5.1)
Sodium: 144 mmol/L (ref 135–145)

## 2024-01-22 LAB — IRON AND TIBC
Iron: 56 ug/dL (ref 28–170)
Saturation Ratios: 18 % (ref 10.4–31.8)
TIBC: 311 ug/dL (ref 250–450)
UIBC: 255 ug/dL

## 2024-01-22 LAB — FERRITIN: Ferritin: 40 ng/mL (ref 11–307)

## 2024-01-22 MED ORDER — SODIUM CHLORIDE 0.9% FLUSH
10.0000 mL | Freq: Once | INTRAVENOUS | Status: AC | PRN
  Administered 2024-01-22: 10 mL
  Filled 2024-01-22: qty 10

## 2024-01-22 MED ORDER — IRON SUCROSE 20 MG/ML IV SOLN
200.0000 mg | Freq: Once | INTRAVENOUS | Status: AC
  Administered 2024-01-22: 200 mg via INTRAVENOUS
  Filled 2024-01-22: qty 10

## 2024-01-22 NOTE — Progress Notes (Signed)
 Had a flu bug/ GI bug last week. Energy is slowly returning. Chronic dyspnea. No blood in stool.

## 2024-01-22 NOTE — Progress Notes (Signed)
 Patient states

## 2024-01-22 NOTE — Progress Notes (Signed)
 [name] CONSULT NOTE  Patient Care Team: Audrey App, NP as PCP - General (Nurse Practitioner) Audrey Hose, MD as Consulting Physician (Pulmonary Disease) Audrey Wolf LABOR, MD as Consulting Physician (Cardiology) [name], MD as Referring Physician (Vascular Surgery) Audrey Cindy SAUNDERS, MD as Consulting Physician (Oncology)  CHIEF COMPLAINTS/PURPOSE OF CONSULTATION: anemia  Oncology History   No problem history exists.     HISTORY OF PRESENTING ILLNESS: ,Patient ambulating-independently.   Alone/   Comer Wolf Sayres 73 y.o.  female pleasant patient with a history of CAD/A-fib on Eliquis  -iron  deficiency anemia likely secondary chronic GI bleed is here for a follow up.  Discussed the use of AI scribe software for clinical note transcription with the patient, who gave verbal consent to proceed.  History of Present Illness   Audrey Wolf is a 73 year old female with chronic iron  deficiency anemia who presents for hematology follow-up to assess ongoing anemia management.  She has chronic anemia with hemoglobin levels previously as low as 8 g/dL in June, improved to 11 g/dL in August, and currently at 10 g/dL. She continues to experience fatigue. Last week, she had significant gastrointestinal symptoms but currently has no residual gastrointestinal complaints. She denies other new symptoms.  She is compliant with daily oral iron  therapy and receives periodic intravenous iron  infusions, with the last infusion in October and another scheduled for today. She feels oral iron  is somewhat helpful but insufficient to normalize her hemoglobin.  She has never undergone colonoscopy and continues to decline this procedure despite persistent anemia.      Review of Systems  Constitutional:  Negative for chills, diaphoresis, fever, malaise/fatigue and weight loss.  HENT:  Negative for nosebleeds and sore throat.   Eyes:  Negative for double vision.  Respiratory:   Negative for cough, hemoptysis, sputum production, shortness of breath and wheezing.   Cardiovascular:  Negative for chest pain, palpitations, orthopnea and leg swelling.  Gastrointestinal:  Negative for abdominal pain, blood in stool, constipation, diarrhea, heartburn, melena, nausea and vomiting.  Genitourinary:  Negative for dysuria, frequency and urgency.  Musculoskeletal:  Negative for back pain and joint pain.  Skin: Negative.  Negative for itching and rash.  Neurological:  Negative for dizziness, tingling, focal weakness, weakness and headaches.  Endo/Heme/Allergies:  Does not bruise/bleed easily.  Psychiatric/Behavioral:  Negative for depression. The patient is not nervous/anxious and does not have insomnia.     MEDICAL HISTORY:  Past Medical History:  Diagnosis Date   Arthritis    Asthma    CHF (congestive heart failure) (HCC)    Chronic kidney disease    Coronary artery disease    GERD (gastroesophageal reflux disease)    Headache    Hypertension    Nephrolithiasis    NSTEMI (non-ST elevated myocardial infarction) (HCC)    Postprandial RUQ pain 05/25/2019    SURGICAL HISTORY: Past Surgical History:  Procedure Laterality Date   ABDOMINAL HYSTERECTOMY     APPENDECTOMY     BREAST BIOPSY Left 12/31/2010   neg   CORONARY ARTERY BYPASS GRAFT N/A 05/22/2017   Procedure: CORONARY ARTERY BYPASS GRAFTING (CABG) x 2 WITH ENDOSCOPIC HARVESTING OF RIGHT SAPHENOUS VEIN;  Surgeon: Army Dallas NOVAK, MD;  Location: [facility] OR;  Service: Open Heart Surgery;  Laterality: N/A;   LEFT HEART CATH AND CORONARY ANGIOGRAPHY N/A 05/18/2017   Procedure: LEFT HEART CATH AND CORONARY ANGIOGRAPHY;  Surgeon: Audrey Wolf LABOR, MD;  Location: [facility] INVASIVE CV LAB;  Service: Cardiovascular;  Laterality: N/A;  LITHOTRIPSY     RENAL ANGIOGRAPHY N/A 07/31/2022   Procedure: RENAL ANGIOGRAPHY;  Surgeon: [name], MD;  Location: [facility] INVASIVE CV LAB;  Service: Cardiovascular;  Laterality: N/A;   RENAL  ANGIOGRAPHY N/A 10/13/2022   Procedure: RENAL ANGIOGRAPHY;  Surgeon: [name], MD;  Location: [facility] INVASIVE CV LAB;  Service: Cardiovascular;  Laterality: N/A;   TEE WITHOUT CARDIOVERSION N/A 05/22/2017   Procedure: TRANSESOPHAGEAL ECHOCARDIOGRAM (TEE);  Surgeon: Army Dallas NOVAK, MD;  Location: [facility] OR;  Service: Open Heart Surgery;  Laterality: N/A;   TONSILLECTOMY      SOCIAL HISTORY: Social History   Socioeconomic History   Marital status: Single    Spouse name: Not on file   Number of children: Not on file   Years of education: Not on file   Highest education level: 12th grade  Occupational History    Employer: [employer]  Tobacco Use   Smoking status: Never   Smokeless tobacco: Never  Vaping Use   Vaping status: Never Used  Substance and Sexual Activity   Alcohol use: No   Drug use: No   Sexual activity: Not Currently    Partners: Male    Birth control/protection: Post-menopausal  Other Topics Concern   Not on file  Social History Narrative   Divorced    Social Drivers of Health   Tobacco Use: Low Risk (01/22/2024)   Patient History    Smoking Tobacco Use: Never    Smokeless Tobacco Use: Never    Passive Exposure: Not on file  Financial Resource Strain: Low Risk (10/13/2023)   Overall Financial Resource Strain (CARDIA)    Difficulty of Paying Living Expenses: Not hard at all  Food Insecurity: No Food Insecurity (10/13/2023)   Epic    Worried About Radiation Protection Practitioner of Food in the Last Year: Never true    Ran Out of Food in the Last Year: Never true  Transportation Needs: No Transportation Needs (10/13/2023)   Epic    Lack of Transportation (Medical): No    Lack of Transportation (Non-Medical): No  Physical Activity: Inactive (10/13/2023)   Exercise Vital Sign    Days of Exercise per Week: 0 days    Minutes of Exercise per Session: 0 min  Stress: Stress Concern Present (10/13/2023)   [name]    Feeling  of Stress: Rather much  Social Connections: Moderately Isolated (10/13/2023)   Social Connection and Isolation Panel    Frequency of Communication with Friends and Family: Twice a week    Frequency of Social Gatherings with Friends and Family: More than three times a week    Attends Religious Services: Never    Database Administrator or Organizations: Yes    Attends Banker Meetings: Never    Marital Status: Divorced  Catering Manager Violence: Not At Risk (10/13/2023)   Epic    Fear of Current or Ex-Partner: No    Emotionally Abused: No    Physically Abused: No    Sexually Abused: No  Depression (PHQ2-9): Low Risk (10/23/2023)   Depression (PHQ2-9)    PHQ-2 Score: 2  Alcohol Screen: Low Risk (10/13/2023)   Alcohol Screen    Last Alcohol Screening Score (AUDIT): 0  Housing: Unknown (10/13/2023)   Epic    Unable to Pay for Housing in the Last Year: No    Number of Times Moved in the Last Year: Not on file    Homeless in the Last Year:  No  Utilities: Not At Risk (10/13/2023)   Epic    Threatened with loss of utilities: No  Health Literacy: Adequate Health Literacy (10/13/2023)   B1300 Health Literacy    Frequency of need for help with medical instructions: Never    FAMILY HISTORY: Family History  Problem Relation Age of Onset   Arthritis Mother    Arthritis Father    Heart disease Father    Stroke Father    Sudden Cardiac Death Father    Stroke Son    Breast cancer Maternal Aunt     ALLERGIES:  is allergic to lisinopril, ace inhibitors, and baclofen .  MEDICATIONS:  Current Outpatient Medications  Medication Sig Dispense Refill   acetaminophen  (TYLENOL ) 500 MG tablet Take 1 tablet (500 mg total) by mouth daily as needed for mild pain or headache. 30 tablet 0   carvedilol  (COREG ) 25 MG tablet Take 2 tablets by mouth twice daily 120 tablet 0   dapagliflozin  propanediol (FARXIGA ) 10 MG TABS tablet Take 1 tablet (10 mg total) by mouth daily before breakfast. 30 tablet 3    diltiazem  (CARDIZEM  CD) 180 MG 24 hr capsule Take 1 capsule by mouth once daily 90 capsule 0   ELIQUIS  5 MG TABS tablet Take 1 tablet by mouth twice daily 60 tablet 0   famotidine  (PEPCID ) 20 MG tablet Take 20 mg by mouth 2 (two) times daily.     ferrous sulfate  325 (65 FE) MG EC tablet Take 325 mg by mouth daily with breakfast.     furosemide  (LASIX ) 20 MG tablet USE dialy only when SOB 30 tablet 11   hydrALAZINE  (APRESOLINE ) 100 MG tablet Take 1 tablet (100 mg total) by mouth 3 (three) times daily. 90 tablet 11   isosorbide  mononitrate (IMDUR ) 30 MG 24 hr tablet Take 1 tablet (30 mg total) by mouth daily. 30 tablet 1   loratadine  (CLARITIN ) 10 MG tablet Take 10 mg by mouth daily.      Multiple Vitamins-Minerals (PRESERVISION AREDS 2) CAPS Take 2 capsules by mouth daily.     NEOMYCIN -POLYMYXIN-HYDROCORTISONE (CORTISPORIN) 1 % SOLN OTIC solution 4 gtt in affected ear(s) tid, max of 10 days. Lie with affected ear upward x 5 minutes 10 mL 0   rosuvastatin  (CRESTOR ) 20 MG tablet Take 1 tablet by mouth once daily 90 tablet 3   SYMBICORT  80-4.5 MCG/ACT inhaler Inhale 2 puffs by mouth twice daily 11 g 0   triamcinolone  (NASACORT  ALLERGY 24HR) 55 MCG/ACT AERO nasal inhaler Place 2 sprays in each nostril daily 10.8 mL 2   valsartan  (DIOVAN ) 160 MG tablet Take 1 tablet (160 mg total) by mouth daily. 90 tablet 3   vitamin C (ASCORBIC ACID) 250 MG tablet Take 250 mg by mouth daily.     Vitamin D , Ergocalciferol , (DRISDOL) 1.25 MG (50000 UNIT) CAPS capsule Take 50,000 Units by mouth every 7 (seven) days.     No current facility-administered medications for this visit.   Facility-Administered Medications Ordered in Other Visits  Medication Dose Route Frequency Provider Last Rate Last Admin   iron  sucrose (VENOFER ) injection 200 mg  200 mg Intravenous Once Saleh Ulbrich R, MD       sodium chloride  flush (NS) 0.9 % injection 10 mL  10 mL Intracatheter Once PRN Lumina Gitto R, MD         PHYSICAL EXAMINATION:  Vitals:   01/22/24 1034  BP: (!) 156/69  Pulse: 74  Resp: 20  Temp: 97.6 F (36.4 C)  SpO2: 100%  Filed Weights   01/22/24 1034  Weight: 225 lb (102.1 kg)    Physical Exam Vitals and nursing note reviewed.  HENT:     Head: Normocephalic and atraumatic.     Mouth/Throat:     Pharynx: Oropharynx is clear.  Eyes:     Extraocular Movements: Extraocular movements intact.     Pupils: Pupils are equal, round, and reactive to light.  Cardiovascular:     Rate and Rhythm: Normal rate and regular rhythm.  Pulmonary:     Comments: Decreased breath sounds bilaterally.  Abdominal:     Palpations: Abdomen is soft.  Musculoskeletal:        General: Normal range of motion.     Cervical back: Normal range of motion.  Skin:    General: Skin is warm.  Neurological:     General: No focal deficit present.     Mental Status: She is alert and oriented to person, place, and time.  Psychiatric:        Behavior: Behavior normal.        Judgment: Judgment normal.     LABORATORY DATA:  I have reviewed the data as listed Lab Results  Component Value Date   WBC 5.4 01/22/2024   HGB 10.1 (L) 01/22/2024   HCT 30.4 (L) 01/22/2024   MCV 92.4 01/22/2024   PLT 212 01/22/2024   Recent Labs    07/07/23 1104 07/10/23 1341 07/17/23 1019 09/21/23 1019 12/14/23 1048 01/22/24 1010  NA 141   < > 140 138 147* 144  K 3.8   < > 3.9 3.6 4.7 4.5  CL 110   < > 111 110 112* 113*  CO2 24   < > 20* 20* CANCELED 20*  GLUCOSE 91   < > 135* 138* 92 130*  BUN 23   < > 22 23 21  27*  CREATININE 0.99   < > 0.99 0.93 1.09* 1.01*  CALCIUM  8.9   < > 8.6* 9.0 9.9 8.9  GFRNONAA  --    < > >60 >60  --  58*  PROT 6.3  --   --   --   --   --   ALBUMIN  3.9  --   --   --   --   --   AST 14  --   --   --   --   --   ALT 11  --   --   --   --   --   ALKPHOS 71  --   --   --   --   --   BILITOT 0.3  --   --   --   --   --    < > = values in this interval not displayed.     RADIOGRAPHIC STUDIES: I have personally reviewed the radiological images as listed and agreed with the findings in the report. No results found.   Symptomatic anemia # Anemia- Hb-symptomatic June 2025- Hb 8/ER].  Severe iron  deficiency- Lack of improvement on oral iron /Iron  sulfate [GI].    # S/p  IV iron  infusion/Venofer -Currently on  gentle iron  . 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 HOWEVER Currently on Eliquis  only.  However patient declines any evaluation with GI [ never colonoscopy because of preference].  # CAD/ A.fib [Dr.Khan/Dr.Mclean]- on Eliquis -  currently OFF plavix /asprin. NO CKD- solitary [Dr.Lateef/Drs.Sninksi]  # DISPOSITION: # venofer  today # venofer  in appx 2  weeks-  # follow up 4 months-  MD; labs- cbc/bmp; iron  studies; ferritin- possible venofer - Dr.B    Above plan of care was discussed with patient/family in detail.  My contact information was given to the patient/family.       Cindy JONELLE Joe, MD 01/22/2024 11:13 AM

## 2024-01-22 NOTE — Assessment & Plan Note (Addendum)
#   Anemia- Hb-symptomatic June 2025- Hb 8/ER].  Severe iron  deficiency- Lack of improvement on oral iron /Iron  sulfate [GI].    # S/p  IV iron  infusion/Venofer -Currently on  gentle iron  . 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 HOWEVER Currently on Eliquis  only.  However patient declines any evaluation with GI [ never colonoscopy because of preference].  # CAD/ A.fib [Dr.Khan/Dr.Mclean]- on Eliquis -  currently OFF plavix /asprin. NO CKD- solitary [Dr.Lateef/Drs.Sninksi]  # DISPOSITION: # venofer  today # venofer  in appx 2 weeks-  # follow up 4 months-  MD; labs- cbc/bmp; iron  studies; ferritin- possible venofer - Dr.B

## 2024-01-25 ENCOUNTER — Ambulatory Visit: Admitting: Cardiovascular Disease

## 2024-01-25 ENCOUNTER — Encounter: Payer: Self-pay | Admitting: Cardiovascular Disease

## 2024-01-25 VITALS — BP 113/78 | HR 77 | Ht 69.0 in | Wt 227.0 lb

## 2024-01-25 DIAGNOSIS — N183 Chronic kidney disease, stage 3 unspecified: Secondary | ICD-10-CM

## 2024-01-25 DIAGNOSIS — R0602 Shortness of breath: Secondary | ICD-10-CM

## 2024-01-25 DIAGNOSIS — I2510 Atherosclerotic heart disease of native coronary artery without angina pectoris: Secondary | ICD-10-CM

## 2024-01-25 DIAGNOSIS — E782 Mixed hyperlipidemia: Secondary | ICD-10-CM | POA: Diagnosis not present

## 2024-01-25 DIAGNOSIS — I10 Essential (primary) hypertension: Secondary | ICD-10-CM

## 2024-01-25 DIAGNOSIS — I480 Paroxysmal atrial fibrillation: Secondary | ICD-10-CM

## 2024-01-25 DIAGNOSIS — I5032 Chronic diastolic (congestive) heart failure: Secondary | ICD-10-CM | POA: Diagnosis not present

## 2024-01-25 DIAGNOSIS — Z951 Presence of aortocoronary bypass graft: Secondary | ICD-10-CM | POA: Diagnosis not present

## 2024-01-25 NOTE — Progress Notes (Signed)
 "     Cardiology Office Note   Date:  01/25/2024   ID:  Audrey, Wolf 09-11-1950, MRN [id number]  PCP:  [name], NP  Cardiologist:  Denyse Bathe, MD      History of Present Illness: Audrey Wolf is a 73 y.o. female who presents for  Chief Complaint  Patient presents with   Follow-up    2 month follow up    Had URI last week.      Past Medical History:  Diagnosis Date   Arthritis    Asthma    CHF (congestive heart failure) (HCC)    Chronic kidney disease    Coronary artery disease    GERD (gastroesophageal reflux disease)    Headache    Hypertension    Nephrolithiasis    NSTEMI (non-ST elevated myocardial infarction) (HCC)    Postprandial RUQ pain 05/25/2019     Past Surgical History:  Procedure Laterality Date   ABDOMINAL HYSTERECTOMY     APPENDECTOMY     BREAST BIOPSY Left 12/31/2010   neg   CORONARY ARTERY BYPASS GRAFT N/A 05/22/2017   Procedure: CORONARY ARTERY BYPASS GRAFTING (CABG) x 2 WITH ENDOSCOPIC HARVESTING OF RIGHT SAPHENOUS VEIN;  Surgeon: Army Dallas NOVAK, MD;  Location: [facility] OR;  Service: Open Heart Surgery;  Laterality: N/A;   LEFT HEART CATH AND CORONARY ANGIOGRAPHY N/A 05/18/2017   Procedure: LEFT HEART CATH AND CORONARY ANGIOGRAPHY;  Surgeon: Bathe Denyse LABOR, MD;  Location: [facility] INVASIVE CV LAB;  Service: Cardiovascular;  Laterality: N/A;   LITHOTRIPSY     RENAL ANGIOGRAPHY N/A 07/31/2022   Procedure: RENAL ANGIOGRAPHY;  Surgeon: [name], MD;  Location: [facility] INVASIVE CV LAB;  Service: Cardiovascular;  Laterality: N/A;   RENAL ANGIOGRAPHY N/A 10/13/2022   Procedure: RENAL ANGIOGRAPHY;  Surgeon: [name], MD;  Location: [facility] INV[facility]IVE CV LAB;  Service: Cardiovascular;  Laterality: N/A;   TEE WITHOUT CARDIOVERSION N/A 05/22/2017   Procedure: TRANSESOPHAGEAL ECHOCARDIOGRAM (TEE);  Surgeon: Army Dallas NOVAK, MD;  Location: [facility] OR;  Service: Open Heart Surgery;  Laterality: N/A;   TONSILLECTOMY       Current Outpatient  Medications  Medication Sig Dispense Refill   acetaminophen  (TYLENOL ) 500 MG tablet Take 1 tablet (500 mg total) by mouth daily as needed for mild pain or headache. 30 tablet 0   carvedilol  (COREG ) 25 MG tablet Take 2 tablets by mouth twice daily 120 tablet 0   dapagliflozin  propanediol (FARXIGA ) 10 MG TABS tablet Take 1 tablet (10 mg total) by mouth daily before breakfast. 30 tablet 3   diltiazem  (CARDIZEM  CD) 180 MG 24 hr capsule Take 1 capsule by mouth once daily 90 capsule 0   ELIQUIS  5 MG TABS tablet Take 1 tablet by mouth twice daily 60 tablet 0   famotidine  (PEPCID ) 20 MG tablet Take 20 mg by mouth 2 (two) times daily.     ferrous sulfate  325 (65 FE) MG EC tablet Take 325 mg by mouth daily with breakfast.     furosemide  (LASIX ) 20 MG tablet USE dialy only when SOB 30 tablet 11   hydrALAZINE  (APRESOLINE ) 100 MG tablet Take 1 tablet (100 mg total) by mouth 3 (three) times daily. 90 tablet 11   isosorbide  mononitrate (IMDUR ) 30 MG 24 hr tablet Take 1 tablet (30 mg total) by mouth daily. 30 tablet 1   loratadine  (CLARITIN ) 10 MG tablet Take 10 mg by mouth daily.      Multiple Vitamins-Minerals (PRESERVISION AREDS 2) CAPS  Take 2 capsules by mouth daily.     NEOMYCIN -POLYMYXIN-HYDROCORTISONE (CORTISPORIN) 1 % SOLN OTIC solution 4 gtt in affected ear(s) tid, max of 10 days. Lie with affected ear upward x 5 minutes 10 mL 0   rosuvastatin  (CRESTOR ) 20 MG tablet Take 1 tablet by mouth once daily 90 tablet 3   SYMBICORT  80-4.5 MCG/ACT inhaler Inhale 2 puffs by mouth twice daily 11 g 0   triamcinolone  (NASACORT  ALLERGY 24HR) 55 MCG/ACT AERO nasal inhaler Place 2 sprays in each nostril daily 10.8 mL 2   valsartan  (DIOVAN ) 160 MG tablet Take 1 tablet (160 mg total) by mouth daily. 90 tablet 3   vitamin C (ASCORBIC ACID) 250 MG tablet Take 250 mg by mouth daily.     Vitamin D , Ergocalciferol , (DRISDOL) 1.25 MG (50000 UNIT) CAPS capsule Take 50,000 Units by mouth every 7 (seven) days.     No current  facility-administered medications for this visit.    Allergies:   Lisinopril, Ace inhibitors, and Baclofen     Social History:   reports that she has never smoked. She has never used smokeless tobacco. She reports that she does not drink alcohol and does not use drugs.   Family History:  family history includes Arthritis in her father and mother; Breast cancer in her maternal aunt; Heart disease in her father; Stroke in her father and son; Sudden Cardiac Death in her father.    ROS:     Review of Systems  Constitutional: Negative.   HENT: Negative.    Eyes: Negative.   Respiratory: Negative.    Gastrointestinal: Negative.   Genitourinary: Negative.   Musculoskeletal: Negative.   Skin: Negative.   Neurological: Negative.   Endo/Heme/Allergies: Negative.   Psychiatric/Behavioral: Negative.    All other systems reviewed and are negative.     All other systems are reviewed and negative.    PHYSICAL EXAM: VS:  BP 113/78   Pulse 77   Ht 5' 9 (1.753 m)   Wt 227 lb (103 kg)   SpO2 98%   BMI 33.52 kg/m  , BMI Body mass index is 33.52 kg/m. Last weight:  Wt Readings from Last 3 Encounters:  01/25/24 227 lb (103 kg)  01/22/24 225 lb (102.1 kg)  12/14/23 231 lb (104.8 kg)     Physical Exam Constitutional:      Appearance: Normal appearance.  Cardiovascular:     Rate and Rhythm: Normal rate and regular rhythm.     Heart sounds: Normal heart sounds.  Pulmonary:     Effort: Pulmonary effort is normal.     Breath sounds: Normal breath sounds.  Musculoskeletal:     Right lower leg: No edema.     Left lower leg: No edema.  Neurological:     Mental Status: She is alert.       EKG:   Recent Labs: 07/07/2023: ALT 11 01/22/2024: BUN 27; Creatinine 1.01; Hemoglobin 10.1; Platelet Count 212; Potassium 4.5; Sodium 144    Lipid Panel    Component Value Date/Time   CHOL 111 07/07/2023 1104   CHOL 153 03/01/2012 1132   TRIG 159.0 (H) 07/07/2023 1104   TRIG 127  03/01/2012 1132   HDL 34.90 (L) 07/07/2023 1104   HDL 27 (L) 03/01/2012 1132   CHOLHDL 3 07/07/2023 1104   VLDL 31.8 07/07/2023 1104   VLDL 25 03/01/2012 1132   LDLCALC 44 07/07/2023 1104   LDLCALC 56 05/09/2022 1513   LDLCALC 101 (H) 03/01/2012 1132   LDLDIRECT 64.0 05/17/2019  1058      Other studies Reviewed: Additional studies/ records that were reviewed today include:  Review of the above records demonstrates:       No data to display            ASSESSMENT AND PLAN:    ICD-10-CM   1. Coronary artery disease involving native coronary artery of native heart without angina pectoris  I25.10     2. Chronic diastolic CHF (congestive heart failure) (HCC)  I50.32     3. Paroxysmal atrial fibrillation (HCC)  I48.0     4. Stage 3 chronic kidney disease, unspecified whether stage 3a or 3b CKD (HCC)  N18.30     5. Severe uncontrolled hypertension  I10    Controlled BP now    6. S/P CABG x 2  Z95.1     7. Mixed hyperlipidemia  E78.2     8. SOB (shortness of breath)  R06.02        Problem List Items Addressed This Visit       Cardiovascular and Mediastinum   Paroxysmal atrial fibrillation (HCC) (Chronic)   CAD (coronary artery disease), native coronary artery - Primary   Chronic diastolic CHF (congestive heart failure) (HCC)     Genitourinary   CKD (chronic kidney disease) stage 3, GFR 30-59 ml/min (HCC)     Other   Hyperlipidemia   S/P CABG x 2   Other Visit Diagnoses       Severe uncontrolled hypertension       Controlled BP now     SOB (shortness of breath)              Disposition:   Return in about 2 months (around 03/27/2024).    Total time spent: 40 minutes  Signed,  Denyse Bathe, MD  01/25/2024 11:46 AM    [facility] "

## 2024-02-09 ENCOUNTER — Inpatient Hospital Stay

## 2024-02-12 ENCOUNTER — Telehealth: Payer: Self-pay | Admitting: Family

## 2024-02-12 ENCOUNTER — Encounter: Payer: Self-pay | Admitting: Internal Medicine

## 2024-02-12 ENCOUNTER — Inpatient Hospital Stay: Attending: Internal Medicine

## 2024-02-12 VITALS — BP 163/61 | HR 67 | Temp 98.9°F | Resp 19

## 2024-02-12 DIAGNOSIS — Z7901 Long term (current) use of anticoagulants: Secondary | ICD-10-CM | POA: Diagnosis not present

## 2024-02-12 DIAGNOSIS — D509 Iron deficiency anemia, unspecified: Secondary | ICD-10-CM | POA: Diagnosis present

## 2024-02-12 DIAGNOSIS — I2510 Atherosclerotic heart disease of native coronary artery without angina pectoris: Secondary | ICD-10-CM | POA: Insufficient documentation

## 2024-02-12 DIAGNOSIS — I4891 Unspecified atrial fibrillation: Secondary | ICD-10-CM | POA: Diagnosis not present

## 2024-02-12 DIAGNOSIS — Z803 Family history of malignant neoplasm of breast: Secondary | ICD-10-CM | POA: Diagnosis not present

## 2024-02-12 DIAGNOSIS — D649 Anemia, unspecified: Secondary | ICD-10-CM

## 2024-02-12 MED ORDER — IRON SUCROSE 20 MG/ML IV SOLN
200.0000 mg | Freq: Once | INTRAVENOUS | Status: AC
  Administered 2024-02-12: 200 mg via INTRAVENOUS

## 2024-02-12 NOTE — Telephone Encounter (Signed)
 Called to confirm/remind patient of their appointment at the [facility] on 02/15/24.   Appointment:   [x] Confirmed  [] Left mess   [] No answer/No voice mail  [] VM Full/unable to leave message  [] Phone not in service  Patient reminded to bring all medications and/or complete list.  Confirmed patient has transportation. Gave directions, instructed to utilize valet parking.

## 2024-02-12 NOTE — Patient Instructions (Signed)

## 2024-02-13 NOTE — Progress Notes (Unsigned)
 "  [facility] Note    PCP: [name], NP Cardiology: Denyse Bathe, MD (last seen 12/25; returns 02/26) HF Cardiology: Dr. Rolan  Chief Complaint: shortness of breath   HPI:  Audrey Wolf is a 74 y.o. female with a history of CAD s/p CAVG, CKD stage 3a, paroxysmal atrial fibrillation and right renal artery stenosis was referred by Dr. Isadora for evaluation of CHF and pulmonary hypertension. Patient has a long history of poorly controlled HTN.  She has an atrophic left kidney and has right renal artery stenosis now s/p stenting in 6/24 by Dr. Marea.  She had LHC in 4/19 showing 70% ostial left main stenosis and subsequently had CABG x 2 with LIMA-LAD and SVG-OM by Dr. Army at [facility]. Echo in 6/24 at outside facility was read as EF 55-60%, mild LVH, grade 2 diastolic dysfunction, normal RV, mild MR, mild AI, mild pulmonary hypertension.  She had a Cardiolite  in 5/24 that showed no ischemia or infarction.   Saw Dr. Isadora 10/24 for evaluation of her chronic dyspnea. She has never smoked and does not have known lung disease. PFTs in 10/24 showed normal spirometry but decreased DLCO, possibly consistent with pulmonary vascular disease. He referred her to this clinic due to concern for CHF and pulmonary hypertension.   Was in the ED 12/30/22 due to bleeding gums.   Was in the ED 01/11/23 for nonspecific chest pain. Evaluation negative and low suspicion for ACS.   Was in the ED 07/10/23 after being sent from PCP office for low Hg. Patient did not want admission since she's asymptomatic and preferred outpatient f/u.   She presents today for a HF visit with a chief complaint of shortness of breath. Has associated fatigue, dry cough, occasional chest pain, palpitations, snoring. Sleeping well although wakes up feeling tired. Denies abdominal distention, pedal edema or weight gain. Nephrology increased her valsartan  to 320mg  daily and patient picked up the medication but didn't  start it thinking that it would be too much.   Says that she would not wear CPAP if needed. Even after discussing how untreated sleep apnea (if she would have it) could be affecting her BP, she still says that she wouldn't wear it.    156/62 at home yesterday. Left BP log in car. Has not taken any of her medications yet this morning because she hadn't eaten breakfast yet.   Labs (4/24): LDL 56 Labs (10/24): K 4.4, creatinine 1.26 Labs (11/24): K 3.9, creatinine 1.01, Hg 10 Labs (12/24): K 3.7, creatinine 1.11, HS-trop 4, Hg 9.8 Labs (6/25): K 3.9, creatinine 0.99, LDL 44, Ferritin 5.9, Hg 8.2 Labs (7/25): K 4.1, creatinine 0.64, Hg 11.2 Labs (8/25): K 3.6, creatinine 0.93, Hg 11.3 Labs (11/25): K 4.7, creatinine 1.09 Labs (12/25): K 4.5, creatinine 1.91, iron  studies negative, Hg 10.1  PMH: 1. CKD stage 3 2. Left hydronephrosis with atrophic left kidney 3. Atrial fibrillation: Paroxysmal 4. CAD: LHC in 4/19 with 70% ostial LM stenosis.  Patient had CABG x 2 in 4/19 by Dr. Army with LIMA-LAD and SVG-OM.  - Cardiolite  (5/24): EF 62%, normal study.  5. Chronic diastolic CHF: Echo (6/24) with EF 55-60%, mild LVH, grade 2 diastolic dysfunction, normal RV, mild MR, mild AI, mild pulmonary hypertension.    6. HTN: Difficult to control.  - ACEI cough 7. Right renal artery stenosis: Stent to right renal artery in 6/24 (Dr. Marea).  8. Hyperlipidemia 9. PFTs (10/24): Normal spirometry but decreased DLCO.  10:  Echo (1/25): G1DD, mild LVH, mild MR, EF 55%. Suboptimal study due to poor windows.   FH: Father with MI, CVA.  Mother with MI.   Social History   Socioeconomic History   Marital status: Single    Spouse name: Not on file   Number of children: Not on file   Years of education: Not on file   Highest education level: 12th grade  Occupational History    Employer: [employer]  Tobacco Use   Smoking status: Never   Smokeless tobacco: Never  Vaping Use   Vaping status: Never  Used  Substance and Sexual Activity   Alcohol use: No   Drug use: No   Sexual activity: Not Currently    Partners: Male    Birth control/protection: Post-menopausal  Other Topics Concern   Not on file  Social History Narrative   Divorced    Social Drivers of Health   Tobacco Use: Low Risk (01/25/2024)   Patient History    Smoking Tobacco Use: Never    Smokeless Tobacco Use: Never    Passive Exposure: Not on file  Financial Resource Strain: Low Risk (10/13/2023)   Overall Financial Resource Strain (CARDIA)    Difficulty of Paying Living Expenses: Not hard at all  Food Insecurity: No Food Insecurity (10/13/2023)   Epic    Worried About Radiation Protection Practitioner of Food in the Last Year: Never true    Ran Out of Food in the Last Year: Never true  Transportation Needs: No Transportation Needs (10/13/2023)   Epic    Lack of Transportation (Medical): No    Lack of Transportation (Non-Medical): No  Physical Activity: Inactive (10/13/2023)   Exercise Vital Sign    Days of Exercise per Week: 0 days    Minutes of Exercise per Session: 0 min  Stress: Stress Concern Present (10/13/2023)   [name]    Feeling of Stress: Rather much  Social Connections: Moderately Isolated (10/13/2023)   Social Connection and Isolation Panel    Frequency of Communication with Friends and Family: Twice a week    Frequency of Social Gatherings with Friends and Family: More than three times a week    Attends Religious Services: Never    Database Administrator or Organizations: Yes    Attends Banker Meetings: Never    Marital Status: Divorced  Catering Manager Violence: Not At Risk (10/13/2023)   Epic    Fear of Current or Ex-Partner: No    Emotionally Abused: No    Physically Abused: No    Sexually Abused: No  Depression (PHQ2-9): Low Risk (10/23/2023)   Depression (PHQ2-9)    PHQ-2 Score: 2  Alcohol Screen: Low Risk (10/13/2023)   Alcohol  Screen    Last Alcohol Screening Score (AUDIT): 0  Housing: Unknown (10/13/2023)   Epic    Unable to Pay for Housing in the Last Year: No    Number of Times Moved in the Last Year: Not on file    Homeless in the Last Year: No  Utilities: Not At Risk (10/13/2023)   Epic    Threatened with loss of utilities: No  Health Literacy: Adequate Health Literacy (10/13/2023)   B1300 Health Literacy    Frequency of need for help with medical instructions: Never   ROS: All systems reviewed and negative except as per HPI.   Current Outpatient Medications  Medication Sig Dispense Refill   acetaminophen  (TYLENOL ) 500 MG tablet Take 1  tablet (500 mg total) by mouth daily as needed for mild pain or headache. 30 tablet 0   carvedilol  (COREG ) 25 MG tablet Take 2 tablets by mouth twice daily 120 tablet 0   dapagliflozin  propanediol (FARXIGA ) 10 MG TABS tablet Take 1 tablet (10 mg total) by mouth daily before breakfast. 30 tablet 3   diltiazem  (CARDIZEM  CD) 180 MG 24 hr capsule Take 1 capsule by mouth once daily 90 capsule 0   ELIQUIS  5 MG TABS tablet Take 1 tablet by mouth twice daily 60 tablet 0   famotidine  (PEPCID ) 20 MG tablet Take 20 mg by mouth 2 (two) times daily.     ferrous sulfate  325 (65 FE) MG EC tablet Take 325 mg by mouth daily with breakfast.     furosemide  (LASIX ) 20 MG tablet USE dialy only when SOB 30 tablet 11   hydrALAZINE  (APRESOLINE ) 100 MG tablet Take 1 tablet (100 mg total) by mouth 3 (three) times daily. 90 tablet 11   isosorbide  mononitrate (IMDUR ) 30 MG 24 hr tablet Take 1 tablet (30 mg total) by mouth daily. 30 tablet 1   loratadine  (CLARITIN ) 10 MG tablet Take 10 mg by mouth daily.      Multiple Vitamins-Minerals (PRESERVISION AREDS 2) CAPS Take 2 capsules by mouth daily.     NEOMYCIN -POLYMYXIN-HYDROCORTISONE (CORTISPORIN) 1 % SOLN OTIC solution 4 gtt in affected ear(s) tid, max of 10 days. Lie with affected ear upward x 5 minutes 10 mL 0   rosuvastatin  (CRESTOR ) 20 MG tablet Take 1  tablet by mouth once daily 90 tablet 3   SYMBICORT  80-4.5 MCG/ACT inhaler Inhale 2 puffs by mouth twice daily 11 g 0   triamcinolone  (NASACORT  ALLERGY 24HR) 55 MCG/ACT AERO nasal inhaler Place 2 sprays in each nostril daily 10.8 mL 2   valsartan  (DIOVAN ) 160 MG tablet Take 1 tablet (160 mg total) by mouth daily. 90 tablet 3   vitamin C (ASCORBIC ACID) 250 MG tablet Take 250 mg by mouth daily.     Vitamin D , Ergocalciferol , (DRISDOL) 1.25 MG (50000 UNIT) CAPS capsule Take 50,000 Units by mouth every 7 (seven) days.     No current facility-administered medications for this visit.   Vitals:   02/15/24 0837  BP: (!) 184/72  Pulse: 69  SpO2: 99%  Weight: 224 lb 9.6 oz (101.9 kg)   Wt Readings from Last 3 Encounters:  02/15/24 224 lb 9.6 oz (101.9 kg)  01/25/24 227 lb (103 kg)  01/22/24 225 lb (102.1 kg)   Lab Results  Component Value Date   CREATININE 1.01 (H) 01/22/2024   CREATININE 1.09 (H) 12/14/2023   CREATININE 0.93 09/21/2023    Physical Exam:  General: Well appearing.  Cor: No JVD. Regular rhythm, rate.  Lungs: clear Abdomen: soft, nontender, nondistended. Extremities: no edema Neuro:. Affect pleasant   Assessment/Plan:  1. Chronic diastolic CHF/pulmonary hypertension: Echo in 6/24 showed EF 55-60%, mild LVH, grade 2 diastolic dysfunction, normal RV, mild MR, mild AI, mild pulmonary hypertension.  PFTs in 10/24 showed normal spirometry but there was decreased DLCO concerning for a pulmonary vascular process. Echo (1/25): G1DD, mild LVH, mild MR, EF 55%. Suboptimal study due to poor windows. She never smoked. NYHA class III symptoms, not clearly volume overloaded on exam.  - Continue farxiga  10mg  daily - weight sown 7 pounds from last visit here 2 months ago 2. CAD: S/p CABG in 4/19 with LIMA-LAD and SVG-OM.  She had a negative Cardiolite  in 5/24.  No chest pain.  -  Continue statin, good lipids in 6/25.  3. CKD stage 3a: Atrophic left kidney, s/p right renal artery  stent placement.  - saw nephrology Geoffry) 11/25 4. Renal artery stenosis: On right, s/p stent placement.  -  on Eliquis  5mg  BID 5. Atrial fibrillation:   - Continue Eliquis  5mg  BID.  - Continue carvedilol  25mg  BID - Continue diltiazem  180mg  daily - saw cardiology Orvil) 12/25 6. HTN: Historically poor control.  - BP 184/72 but hasn't taken any of her medications yet this morning - Renal artery stenosis s/p PCI right renal artery. BP at home yesterday was 156/62  - Continue hydralazine  100mg  TID/ Imdur  30mg  daily - Increase valsartan  to 320mg  daily, per nephrology - Continue Coreg  25 mg bid.  - She is on diltiazem  CD.  Emphasized bring her BP log to the appointments. Discussed possibly changing her diltiazem  CD to amlodipine .  - Discussed doing a sleep study to r/o OSA. She endorses snoring and says that she's tired all the time. Reviewed how untreated OSA could be contributing to her HTN. She is not interested as she says that she wouldn't wear CPAP equipment. Can continue to discuss.  - Will have her return for next appointment later in the day after she has taken her medications.    Return in 4 months, sooner if needed. Has PCP / cardiology appt next month and vascular / nephrology in March.   I spent 35 minutes reviewing records, interviewing/ examing patient and managing plan/ orders.   Ellouise DELENA Class FNP-C 02/15/2024  "

## 2024-02-14 ENCOUNTER — Other Ambulatory Visit: Payer: Self-pay | Admitting: Cardiovascular Disease

## 2024-02-14 DIAGNOSIS — I2510 Atherosclerotic heart disease of native coronary artery without angina pectoris: Secondary | ICD-10-CM

## 2024-02-14 DIAGNOSIS — I10 Essential (primary) hypertension: Secondary | ICD-10-CM

## 2024-02-14 DIAGNOSIS — N183 Chronic kidney disease, stage 3 unspecified: Secondary | ICD-10-CM

## 2024-02-14 DIAGNOSIS — I5032 Chronic diastolic (congestive) heart failure: Secondary | ICD-10-CM

## 2024-02-14 DIAGNOSIS — R040 Epistaxis: Secondary | ICD-10-CM

## 2024-02-14 DIAGNOSIS — I701 Atherosclerosis of renal artery: Secondary | ICD-10-CM

## 2024-02-14 DIAGNOSIS — I480 Paroxysmal atrial fibrillation: Secondary | ICD-10-CM

## 2024-02-15 ENCOUNTER — Ambulatory Visit: Attending: Family | Admitting: Family

## 2024-02-15 ENCOUNTER — Encounter: Payer: Self-pay | Admitting: Internal Medicine

## 2024-02-15 ENCOUNTER — Encounter: Payer: Self-pay | Admitting: Family

## 2024-02-15 VITALS — BP 184/72 | HR 69 | Wt 224.6 lb

## 2024-02-15 DIAGNOSIS — N261 Atrophy of kidney (terminal): Secondary | ICD-10-CM | POA: Insufficient documentation

## 2024-02-15 DIAGNOSIS — R0683 Snoring: Secondary | ICD-10-CM | POA: Diagnosis not present

## 2024-02-15 DIAGNOSIS — I080 Rheumatic disorders of both mitral and aortic valves: Secondary | ICD-10-CM | POA: Diagnosis not present

## 2024-02-15 DIAGNOSIS — R059 Cough, unspecified: Secondary | ICD-10-CM | POA: Diagnosis not present

## 2024-02-15 DIAGNOSIS — R002 Palpitations: Secondary | ICD-10-CM | POA: Insufficient documentation

## 2024-02-15 DIAGNOSIS — Z7901 Long term (current) use of anticoagulants: Secondary | ICD-10-CM | POA: Diagnosis not present

## 2024-02-15 DIAGNOSIS — I2510 Atherosclerotic heart disease of native coronary artery without angina pectoris: Secondary | ICD-10-CM | POA: Insufficient documentation

## 2024-02-15 DIAGNOSIS — I701 Atherosclerosis of renal artery: Secondary | ICD-10-CM | POA: Diagnosis not present

## 2024-02-15 DIAGNOSIS — Z951 Presence of aortocoronary bypass graft: Secondary | ICD-10-CM | POA: Diagnosis not present

## 2024-02-15 DIAGNOSIS — N182 Chronic kidney disease, stage 2 (mild): Secondary | ICD-10-CM | POA: Diagnosis not present

## 2024-02-15 DIAGNOSIS — I10 Essential (primary) hypertension: Secondary | ICD-10-CM

## 2024-02-15 DIAGNOSIS — R079 Chest pain, unspecified: Secondary | ICD-10-CM | POA: Insufficient documentation

## 2024-02-15 DIAGNOSIS — I130 Hypertensive heart and chronic kidney disease with heart failure and stage 1 through stage 4 chronic kidney disease, or unspecified chronic kidney disease: Secondary | ICD-10-CM | POA: Insufficient documentation

## 2024-02-15 DIAGNOSIS — Z79899 Other long term (current) drug therapy: Secondary | ICD-10-CM | POA: Insufficient documentation

## 2024-02-15 DIAGNOSIS — N1831 Chronic kidney disease, stage 3a: Secondary | ICD-10-CM | POA: Diagnosis not present

## 2024-02-15 DIAGNOSIS — Z95828 Presence of other vascular implants and grafts: Secondary | ICD-10-CM | POA: Diagnosis not present

## 2024-02-15 DIAGNOSIS — I5032 Chronic diastolic (congestive) heart failure: Secondary | ICD-10-CM | POA: Diagnosis not present

## 2024-02-15 DIAGNOSIS — I480 Paroxysmal atrial fibrillation: Secondary | ICD-10-CM | POA: Diagnosis not present

## 2024-02-15 DIAGNOSIS — R0602 Shortness of breath: Secondary | ICD-10-CM | POA: Insufficient documentation

## 2024-02-15 NOTE — Patient Instructions (Signed)
 Increase your valsartan  to 320mg  per Dr. Marcelino.

## 2024-02-23 ENCOUNTER — Other Ambulatory Visit: Payer: Self-pay | Admitting: Cardiovascular Disease

## 2024-03-11 ENCOUNTER — Encounter: Payer: Self-pay | Admitting: Nurse Practitioner

## 2024-03-11 ENCOUNTER — Ambulatory Visit: Admitting: Nurse Practitioner

## 2024-03-11 ENCOUNTER — Encounter: Payer: Self-pay | Admitting: Internal Medicine

## 2024-03-11 VITALS — BP 142/62 | HR 70 | Temp 98.1°F | Ht 69.0 in | Wt 228.0 lb

## 2024-03-11 DIAGNOSIS — I5032 Chronic diastolic (congestive) heart failure: Secondary | ICD-10-CM

## 2024-03-11 DIAGNOSIS — I701 Atherosclerosis of renal artery: Secondary | ICD-10-CM

## 2024-03-11 DIAGNOSIS — I10 Essential (primary) hypertension: Secondary | ICD-10-CM

## 2024-03-11 DIAGNOSIS — D649 Anemia, unspecified: Secondary | ICD-10-CM

## 2024-03-11 DIAGNOSIS — I480 Paroxysmal atrial fibrillation: Secondary | ICD-10-CM

## 2024-03-11 NOTE — Assessment & Plan Note (Signed)
 Follow up with Nephrology and Vascular as scheduled.

## 2024-03-11 NOTE — Assessment & Plan Note (Signed)
Continue current medication regimen. Follow up with Cardiology.

## 2024-03-11 NOTE — Assessment & Plan Note (Signed)
 Uncontrolled hypertension is present with elevated readings x 2 today in office, improvement noted on recheck. Blood pressure is labile but improved, managed by cardiology and nephrology. She is on multiple antihypertensives. Valsartan  was recently increased to 320 mg. High salt intake and renal artery stenosis contribute, and she has difficulty adhering to a low-salt diet. She experiences no dizziness or swelling. Continue the current antihypertensive regimen, including valsartan , hydralazine , carvedilol , diltiazem , isosorbide  and lasix . Monitor blood pressure at home, especially 1-2 hours after taking medication. Encourage dietary modifications to reduce salt intake. Follow up with cardiology and nephrology as scheduled.

## 2024-03-11 NOTE — Assessment & Plan Note (Signed)
 Continue current medication regimen. Euvolemic on exam. Follow up with Cardiology and [facility] Failure Clinic as scheduled.

## 2024-03-11 NOTE — Assessment & Plan Note (Signed)
 Iron  deficiency anemia is likely due to chronic gastrointestinal blood loss and is managed with iron  infusions. Hemoglobin has improved. There are no overt signs of GI bleeding. Declines evaluation by GI. She continues Eliquis  for atrial fibrillation. Continue iron  infusions as scheduled and follow up with hematology.

## 2024-03-11 NOTE — Progress Notes (Signed)
 " Leron Glance, NP-C Phone: [phone]  Discussed the use of AI scribe software for clinical note transcription with the patient, who gave verbal consent to proceed.  History of Present Illness   Audrey Wolf is a 74 year old female with hypertension, atrial fibrillation, and chronic kidney disease who presents for routine follow-up.  Her blood pressure is variable but has improved. She monitors it at home and notes it is still inconsistent but better than before. She has a history of renal artery stenosis with a stent placement. She is under the care of cardiology, heart failure clinic, and nephrology.  She has a history of atrial fibrillation and is taking Eliquis . She also has congestive heart failure and is on multiple medications including Lasix , Imdur , diltiazem , carvedilol , valsartan  (recently increased to 320 mg), hydralazine  three times a day, and Farxiga . She adheres to her medication regimen despite feeling that she takes 'too much medicine'.  She experiences chronic shortness of breath, which she describes as a 'forever thing' with no change in this symptom. No chest pain, dizziness, or swelling. She has a chronic cough and uses a Symbicort  inhaler, which she feels helps 'sometimes'.  She continues to work 40 hours a week despite her medical conditions. She is under hematology care and receives iron  infusions. She continues to take iron  supplements. She denies smoking.       Tobacco Use History[1]  Medications Ordered Prior to Encounter[2]   ROS see history of present illness  Objective  Physical Exam Vitals:   03/11/24 1124 03/11/24 1157  BP: (!) 158/60 (!) 142/62  Pulse: 70   Temp: 98.1 F (36.7 C)   SpO2: 99%     BP Readings from Last 3 Encounters:  03/11/24 (!) 142/62  02/15/24 (!) 184/72  02/12/24 (!) 163/61   Wt Readings from Last 3 Encounters:  03/11/24 228 lb (103.4 kg)  02/15/24 224 lb 9.6 oz (101.9 kg)  01/25/24 227 lb (103 kg)    Physical  Exam Constitutional:      General: She is not in acute distress.    Appearance: Normal appearance.  HENT:     Head: Normocephalic.  Cardiovascular:     Rate and Rhythm: Normal rate and regular rhythm.     Heart sounds: Normal heart sounds.  Pulmonary:     Effort: Pulmonary effort is normal.     Breath sounds: Normal breath sounds.  Skin:    General: Skin is warm and dry.  Neurological:     General: No focal deficit present.     Mental Status: She is alert.  Psychiatric:        Mood and Affect: Mood normal.        Behavior: Behavior normal.      Assessment/Plan: Please see individual problem list.  Primary hypertension Assessment & Plan: Uncontrolled hypertension is present with elevated readings x 2 today in office, improvement noted on recheck. Blood pressure is labile but improved, managed by cardiology and nephrology. She is on multiple antihypertensives. Valsartan  was recently increased to 320 mg. High salt intake and renal artery stenosis contribute, and she has difficulty adhering to a low-salt diet. She experiences no dizziness or swelling. Continue the current antihypertensive regimen, including valsartan , hydralazine , carvedilol , diltiazem , isosorbide  and lasix . Monitor blood pressure at home, especially 1-2 hours after taking medication. Encourage dietary modifications to reduce salt intake. Follow up with cardiology and nephrology as scheduled.    Renal artery stenosis Assessment & Plan: Follow up with Nephrology and Vascular  as scheduled.    Paroxysmal atrial fibrillation (HCC) Assessment & Plan: Continue current medication regimen. Follow up with Cardiology.    Chronic diastolic CHF (congestive heart failure) (HCC) Assessment & Plan: Continue current medication regimen. Euvolemic on exam. Follow up with Cardiology and Heart Failure Clinic as scheduled.    Symptomatic anemia Assessment & Plan: Iron  deficiency anemia is likely due to chronic gastrointestinal  blood loss and is managed with iron  infusions. Hemoglobin has improved. There are no overt signs of GI bleeding. Declines evaluation by GI. She continues Eliquis  for atrial fibrillation. Continue iron  infusions as scheduled and follow up with hematology.      Return in about 6 months (around 09/08/2024) for Follow up.   Leron Glance, NP-C  [facility] - [place]     [1]  Social History Tobacco Use  Smoking Status Never  Smokeless Tobacco Never  [2]  Current Outpatient Medications on File Prior to Visit  Medication Sig Dispense Refill   acetaminophen  (TYLENOL ) 500 MG tablet Take 1 tablet (500 mg total) by mouth daily as needed for mild pain or headache. 30 tablet 0   carvedilol  (COREG ) 25 MG tablet Take 2 tablets by mouth twice daily 120 tablet 0   dapagliflozin  propanediol (FARXIGA ) 10 MG TABS tablet Take 1 tablet (10 mg total) by mouth daily before breakfast. 30 tablet 3   diltiazem  (CARDIZEM  CD) 180 MG 24 hr capsule Take 1 capsule by mouth once daily 90 capsule 0   ELIQUIS  5 MG TABS tablet Take 1 tablet by mouth twice daily 60 tablet 0   famotidine  (PEPCID ) 20 MG tablet Take 20 mg by mouth 2 (two) times daily.     ferrous sulfate  325 (65 FE) MG EC tablet Take 325 mg by mouth daily with breakfast.     furosemide  (LASIX ) 20 MG tablet USE dialy only when SOB 30 tablet 11   hydrALAZINE  (APRESOLINE ) 100 MG tablet Take 1 tablet (100 mg total) by mouth 3 (three) times daily. 90 tablet 11   isosorbide  mononitrate (IMDUR ) 30 MG 24 hr tablet Take 1 tablet (30 mg total) by mouth daily. 30 tablet 1   loratadine  (CLARITIN ) 10 MG tablet Take 10 mg by mouth daily.      Multiple Vitamins-Minerals (PRESERVISION AREDS 2) CAPS Take 2 capsules by mouth daily.     rosuvastatin  (CRESTOR ) 20 MG tablet Take 1 tablet by mouth once daily 90 tablet 3   SYMBICORT  80-4.5 MCG/ACT inhaler Inhale 2 puffs by mouth twice daily 11 g 0   valsartan  (DIOVAN ) 320 MG tablet Take 320 mg by mouth daily.      vitamin C (ASCORBIC ACID) 250 MG tablet Take 250 mg by mouth daily.     Vitamin D , Ergocalciferol , (DRISDOL) 1.25 MG (50000 UNIT) CAPS capsule Take 50,000 Units by mouth every 7 (seven) days.     No current facility-administered medications on file prior to visit.   "

## 2024-03-28 ENCOUNTER — Ambulatory Visit: Admitting: Cardiovascular Disease

## 2024-04-22 ENCOUNTER — Ambulatory Visit (INDEPENDENT_AMBULATORY_CARE_PROVIDER_SITE_OTHER): Admitting: Nurse Practitioner

## 2024-04-22 ENCOUNTER — Encounter (INDEPENDENT_AMBULATORY_CARE_PROVIDER_SITE_OTHER)

## 2024-05-23 ENCOUNTER — Inpatient Hospital Stay: Admitting: Internal Medicine

## 2024-05-23 ENCOUNTER — Inpatient Hospital Stay

## 2024-06-17 ENCOUNTER — Ambulatory Visit: Admitting: Family

## 2024-09-09 ENCOUNTER — Ambulatory Visit: Admitting: Nurse Practitioner

## 2024-10-17 ENCOUNTER — Ambulatory Visit
